# Patient Record
Sex: Female | Born: 1935 | Race: White | Hispanic: No | Marital: Married | Smoking: Former smoker
Health system: Southern US, Community
[De-identification: ages and names within clinical notes are randomized; demographics above are authoritative.]

## PROBLEM LIST (undated history)

## (undated) DIAGNOSIS — R112 Nausea with vomiting, unspecified: Secondary | ICD-10-CM

## (undated) DIAGNOSIS — F32A Depression, unspecified: Secondary | ICD-10-CM

## (undated) DIAGNOSIS — K219 Gastro-esophageal reflux disease without esophagitis: Secondary | ICD-10-CM

## (undated) DIAGNOSIS — F419 Anxiety disorder, unspecified: Secondary | ICD-10-CM

## (undated) DIAGNOSIS — H269 Unspecified cataract: Secondary | ICD-10-CM

## (undated) DIAGNOSIS — R0602 Shortness of breath: Secondary | ICD-10-CM

## (undated) DIAGNOSIS — J449 Chronic obstructive pulmonary disease, unspecified: Secondary | ICD-10-CM

## (undated) DIAGNOSIS — J40 Bronchitis, not specified as acute or chronic: Secondary | ICD-10-CM

## (undated) DIAGNOSIS — Z8719 Personal history of other diseases of the digestive system: Secondary | ICD-10-CM

## (undated) DIAGNOSIS — I8290 Acute embolism and thrombosis of unspecified vein: Secondary | ICD-10-CM

## (undated) DIAGNOSIS — F329 Major depressive disorder, single episode, unspecified: Secondary | ICD-10-CM

## (undated) DIAGNOSIS — T7840XA Allergy, unspecified, initial encounter: Secondary | ICD-10-CM

## (undated) DIAGNOSIS — D759 Disease of blood and blood-forming organs, unspecified: Secondary | ICD-10-CM

## (undated) DIAGNOSIS — J111 Influenza due to unidentified influenza virus with other respiratory manifestations: Secondary | ICD-10-CM

## (undated) DIAGNOSIS — M1990 Unspecified osteoarthritis, unspecified site: Secondary | ICD-10-CM

## (undated) DIAGNOSIS — D689 Coagulation defect, unspecified: Secondary | ICD-10-CM

## (undated) DIAGNOSIS — G9050 Complex regional pain syndrome I, unspecified: Secondary | ICD-10-CM

## (undated) DIAGNOSIS — I10 Essential (primary) hypertension: Secondary | ICD-10-CM

## (undated) DIAGNOSIS — Z9889 Other specified postprocedural states: Secondary | ICD-10-CM

## (undated) DIAGNOSIS — IMO0002 Reserved for concepts with insufficient information to code with codable children: Secondary | ICD-10-CM

## (undated) DIAGNOSIS — N189 Chronic kidney disease, unspecified: Secondary | ICD-10-CM

## (undated) DIAGNOSIS — J189 Pneumonia, unspecified organism: Secondary | ICD-10-CM

## (undated) DIAGNOSIS — E785 Hyperlipidemia, unspecified: Secondary | ICD-10-CM

## (undated) DIAGNOSIS — R7303 Prediabetes: Secondary | ICD-10-CM

## (undated) HISTORY — DX: Allergy, unspecified, initial encounter: T78.40XA

## (undated) HISTORY — PX: COLONOSCOPY: SHX174

## (undated) HISTORY — DX: Major depressive disorder, single episode, unspecified: F32.9

## (undated) HISTORY — DX: Complex regional pain syndrome I, unspecified: G90.50

## (undated) HISTORY — DX: Depression, unspecified: F32.A

## (undated) HISTORY — DX: Unspecified osteoarthritis, unspecified site: M19.90

## (undated) HISTORY — DX: Unspecified cataract: H26.9

## (undated) HISTORY — PX: UPPER GASTROINTESTINAL ENDOSCOPY: SHX188

## (undated) HISTORY — PX: KNEE LIGAMENT RECONSTRUCTION: SHX1895

## (undated) HISTORY — DX: Reserved for concepts with insufficient information to code with codable children: IMO0002

## (undated) HISTORY — PX: KNEE ARTHROSCOPY: SHX127

## (undated) HISTORY — DX: Coagulation defect, unspecified: D68.9

## (undated) HISTORY — DX: Essential (primary) hypertension: I10

## (undated) HISTORY — PX: POLYPECTOMY: SHX149

## (undated) HISTORY — DX: Hyperlipidemia, unspecified: E78.5

## (undated) HISTORY — DX: Influenza due to unidentified influenza virus with other respiratory manifestations: J11.1

## (undated) HISTORY — PX: ELBOW ARTHROSCOPY: SUR87

## (undated) HISTORY — PX: TONSILLECTOMY AND ADENOIDECTOMY: SUR1326

## (undated) HISTORY — DX: Anxiety disorder, unspecified: F41.9

## (undated) HISTORY — DX: Gastro-esophageal reflux disease without esophagitis: K21.9

## (undated) HISTORY — DX: Bronchitis, not specified as acute or chronic: J40

## (undated) HISTORY — PX: GANGLION CYST EXCISION: SHX1691

---

## 1978-10-06 HISTORY — PX: TOTAL ABDOMINAL HYSTERECTOMY: SHX209

## 1998-01-17 ENCOUNTER — Ambulatory Visit (HOSPITAL_COMMUNITY): Admission: RE | Admit: 1998-01-17 | Discharge: 1998-01-17 | Payer: Self-pay | Admitting: Family Medicine

## 1998-01-29 ENCOUNTER — Other Ambulatory Visit: Admission: RE | Admit: 1998-01-29 | Discharge: 1998-01-29 | Payer: Self-pay | Admitting: Family Medicine

## 1998-02-13 ENCOUNTER — Ambulatory Visit (HOSPITAL_BASED_OUTPATIENT_CLINIC_OR_DEPARTMENT_OTHER): Admission: RE | Admit: 1998-02-13 | Discharge: 1998-02-13 | Payer: Self-pay | Admitting: Orthopedic Surgery

## 1998-03-08 ENCOUNTER — Ambulatory Visit (HOSPITAL_COMMUNITY): Admission: RE | Admit: 1998-03-08 | Discharge: 1998-03-08 | Payer: Self-pay | Admitting: Family Medicine

## 1998-04-10 ENCOUNTER — Ambulatory Visit (HOSPITAL_COMMUNITY): Admission: RE | Admit: 1998-04-10 | Discharge: 1998-04-10 | Payer: Self-pay | Admitting: Family Medicine

## 1998-10-06 HISTORY — PX: CHOLECYSTECTOMY: SHX55

## 1999-05-09 ENCOUNTER — Other Ambulatory Visit: Admission: RE | Admit: 1999-05-09 | Discharge: 1999-05-09 | Payer: Self-pay | Admitting: Family Medicine

## 1999-11-15 ENCOUNTER — Encounter: Admission: RE | Admit: 1999-11-15 | Discharge: 1999-11-15 | Payer: Self-pay | Admitting: Family Medicine

## 1999-11-15 ENCOUNTER — Encounter: Payer: Self-pay | Admitting: Family Medicine

## 1999-12-16 ENCOUNTER — Encounter: Payer: Self-pay | Admitting: Family Medicine

## 1999-12-16 ENCOUNTER — Encounter: Admission: RE | Admit: 1999-12-16 | Discharge: 1999-12-16 | Payer: Self-pay | Admitting: Family Medicine

## 2000-05-13 ENCOUNTER — Other Ambulatory Visit: Admission: RE | Admit: 2000-05-13 | Discharge: 2000-05-13 | Payer: Self-pay | Admitting: Family Medicine

## 2000-05-28 ENCOUNTER — Encounter: Admission: RE | Admit: 2000-05-28 | Discharge: 2000-05-28 | Payer: Self-pay | Admitting: Family Medicine

## 2000-05-28 ENCOUNTER — Encounter: Payer: Self-pay | Admitting: Family Medicine

## 2000-10-06 HISTORY — PX: ORIF DISTAL RADIUS FRACTURE: SUR927

## 2001-06-04 ENCOUNTER — Encounter: Payer: Self-pay | Admitting: Family Medicine

## 2001-06-04 ENCOUNTER — Encounter: Admission: RE | Admit: 2001-06-04 | Discharge: 2001-06-04 | Payer: Self-pay | Admitting: Family Medicine

## 2001-06-05 ENCOUNTER — Ambulatory Visit (HOSPITAL_COMMUNITY): Admission: RE | Admit: 2001-06-05 | Discharge: 2001-06-05 | Payer: Self-pay | Admitting: Family Medicine

## 2001-06-05 ENCOUNTER — Encounter: Payer: Self-pay | Admitting: Family Medicine

## 2001-06-14 ENCOUNTER — Other Ambulatory Visit: Admission: RE | Admit: 2001-06-14 | Discharge: 2001-06-14 | Payer: Self-pay | Admitting: Family Medicine

## 2001-07-15 ENCOUNTER — Encounter: Admission: RE | Admit: 2001-07-15 | Discharge: 2001-07-15 | Payer: Self-pay | Admitting: Family Medicine

## 2001-07-15 ENCOUNTER — Encounter: Payer: Self-pay | Admitting: Family Medicine

## 2002-02-16 ENCOUNTER — Emergency Department (HOSPITAL_COMMUNITY): Admission: EM | Admit: 2002-02-16 | Discharge: 2002-02-16 | Payer: Self-pay | Admitting: Emergency Medicine

## 2002-02-16 ENCOUNTER — Encounter: Payer: Self-pay | Admitting: Emergency Medicine

## 2002-02-18 ENCOUNTER — Ambulatory Visit (HOSPITAL_BASED_OUTPATIENT_CLINIC_OR_DEPARTMENT_OTHER): Admission: RE | Admit: 2002-02-18 | Discharge: 2002-02-18 | Payer: Self-pay | Admitting: Orthopedic Surgery

## 2003-04-11 ENCOUNTER — Encounter: Payer: Self-pay | Admitting: Family Medicine

## 2003-04-11 ENCOUNTER — Encounter: Admission: RE | Admit: 2003-04-11 | Discharge: 2003-04-11 | Payer: Self-pay | Admitting: Family Medicine

## 2003-05-10 ENCOUNTER — Encounter: Admission: RE | Admit: 2003-05-10 | Discharge: 2003-08-08 | Payer: Self-pay | Admitting: Family Medicine

## 2004-04-11 ENCOUNTER — Encounter: Admission: RE | Admit: 2004-04-11 | Discharge: 2004-04-11 | Payer: Self-pay | Admitting: Family Medicine

## 2005-04-23 ENCOUNTER — Encounter: Admission: RE | Admit: 2005-04-23 | Discharge: 2005-04-23 | Payer: Self-pay | Admitting: Family Medicine

## 2006-05-07 ENCOUNTER — Encounter: Admission: RE | Admit: 2006-05-07 | Discharge: 2006-05-07 | Payer: Self-pay | Admitting: Family Medicine

## 2006-05-12 ENCOUNTER — Ambulatory Visit: Payer: Self-pay | Admitting: Internal Medicine

## 2006-05-26 ENCOUNTER — Encounter: Payer: Self-pay | Admitting: Internal Medicine

## 2006-05-26 ENCOUNTER — Ambulatory Visit: Payer: Self-pay | Admitting: Internal Medicine

## 2007-05-12 ENCOUNTER — Encounter: Admission: RE | Admit: 2007-05-12 | Discharge: 2007-05-12 | Payer: Self-pay | Admitting: Family Medicine

## 2007-05-19 ENCOUNTER — Encounter: Admission: RE | Admit: 2007-05-19 | Discharge: 2007-05-19 | Payer: Self-pay | Admitting: Family Medicine

## 2007-10-07 HISTORY — PX: ROTATOR CUFF REPAIR: SHX139

## 2007-11-09 ENCOUNTER — Encounter: Admission: RE | Admit: 2007-11-09 | Discharge: 2007-11-09 | Payer: Self-pay | Admitting: Family Medicine

## 2008-04-25 ENCOUNTER — Encounter: Admission: RE | Admit: 2008-04-25 | Discharge: 2008-05-15 | Payer: Self-pay | Admitting: Family Medicine

## 2008-05-12 ENCOUNTER — Encounter: Admission: RE | Admit: 2008-05-12 | Discharge: 2008-05-12 | Payer: Self-pay | Admitting: Family Medicine

## 2008-09-06 ENCOUNTER — Inpatient Hospital Stay (HOSPITAL_COMMUNITY): Admission: EM | Admit: 2008-09-06 | Discharge: 2008-09-08 | Payer: Self-pay | Admitting: Emergency Medicine

## 2008-09-13 ENCOUNTER — Encounter: Admission: RE | Admit: 2008-09-13 | Discharge: 2008-10-05 | Payer: Self-pay | Admitting: Orthopedic Surgery

## 2008-10-09 ENCOUNTER — Encounter: Admission: RE | Admit: 2008-10-09 | Discharge: 2009-01-07 | Payer: Self-pay | Admitting: Orthopedic Surgery

## 2009-05-15 ENCOUNTER — Encounter: Admission: RE | Admit: 2009-05-15 | Discharge: 2009-05-15 | Payer: Self-pay | Admitting: Family Medicine

## 2009-06-29 ENCOUNTER — Emergency Department (HOSPITAL_COMMUNITY): Admission: EM | Admit: 2009-06-29 | Discharge: 2009-06-30 | Payer: Self-pay | Admitting: Emergency Medicine

## 2009-06-29 ENCOUNTER — Ambulatory Visit: Payer: Self-pay | Admitting: Vascular Surgery

## 2009-06-30 ENCOUNTER — Encounter (INDEPENDENT_AMBULATORY_CARE_PROVIDER_SITE_OTHER): Payer: Self-pay | Admitting: Emergency Medicine

## 2009-06-30 ENCOUNTER — Ambulatory Visit (HOSPITAL_COMMUNITY): Admission: RE | Admit: 2009-06-30 | Discharge: 2009-06-30 | Payer: Self-pay | Admitting: Emergency Medicine

## 2010-05-16 ENCOUNTER — Encounter: Admission: RE | Admit: 2010-05-16 | Discharge: 2010-05-16 | Payer: Self-pay | Admitting: Family Medicine

## 2011-01-10 LAB — COMPREHENSIVE METABOLIC PANEL
ALT: 18 U/L (ref 0–35)
AST: 24 U/L (ref 0–37)
Albumin: 3.8 g/dL (ref 3.5–5.2)
Alkaline Phosphatase: 115 U/L (ref 39–117)
BUN: 17 mg/dL (ref 6–23)
CO2: 25 mEq/L (ref 19–32)
Calcium: 9.5 mg/dL (ref 8.4–10.5)
Chloride: 108 mEq/L (ref 96–112)
Creatinine, Ser: 0.97 mg/dL (ref 0.4–1.2)
GFR calc Af Amer: >60 mL/min (ref >60)
GFR calc non Af Amer: 57 mL/min — ABNORMAL LOW (ref >60)
Glucose, Bld: 124 mg/dL — ABNORMAL HIGH (ref 70–99)
Potassium: 4.2 mEq/L (ref 3.5–5.1)
Sodium: 140 mEq/L (ref 135–145)
Total Bilirubin: 0.7 mg/dL (ref 0.3–1.2)
Total Protein: 7.3 g/dL (ref 6.0–8.3)

## 2011-01-10 LAB — DIFFERENTIAL
Basophils Absolute: 0.1 10*3/uL (ref 0.0–0.1)
Basophils Relative: 1 % (ref 0–1)
Eosinophils Absolute: 0.2 10*3/uL (ref 0.0–0.7)
Eosinophils Relative: 2 % (ref 0–5)
Lymphocytes Relative: 26 % (ref 12–46)
Lymphs Abs: 2.5 10*3/uL (ref 0.7–4.0)
Monocytes Absolute: 0.8 10*3/uL (ref 0.1–1.0)
Monocytes Relative: 9 % (ref 3–12)
Neutro Abs: 6 10*3/uL (ref 1.7–7.7)
Neutrophils Relative %: 63 % (ref 43–77)

## 2011-01-10 LAB — CBC
HCT: 39.4 % (ref 36.0–46.0)
Hemoglobin: 13.3 g/dL (ref 12.0–15.0)
MCHC: 33.8 g/dL (ref 30.0–36.0)
MCV: 92.4 fL (ref 78.0–100.0)
Platelets: 285 10*3/uL (ref 150–400)
RBC: 4.27 MIL/uL (ref 3.87–5.11)
RDW: 13.1 % (ref 11.5–15.5)
WBC: 9.6 10*3/uL (ref 4.0–10.5)

## 2011-01-10 LAB — PROTIME-INR
INR: 0.9 (ref 0.00–1.49)
Prothrombin Time: 12.5 seconds (ref 11.6–15.2)

## 2011-01-10 LAB — APTT: aPTT: 26 seconds (ref 24–37)

## 2011-02-18 NOTE — H&P (Signed)
NAME:  Deanna Acosta NO.:  [id number]   MEDICAL RECORD NO.:  [id number]          PATIENT TYPE:  EMS   LOCATION:  MAJO                         FACILITY:  [facility]   PHYSICIAN:  Michiel Cowboy, MDDATE OF BIRTH:  May 24, 1936   DATE OF ADMISSION:  09/06/2008  DATE OF DISCHARGE:                              HISTORY & PHYSICAL   PRIMARY CARE PHYSICIAN:  Dr. Sigmund Hazel.   ORTHOPEDIC SURGEON:  Dr. Ranell Patrick.   CHIEF COMPLAINT:  Cough and shortness of breath.   Patient is a 75 year old female who had on Monday a same day surgery for  rotator cuff tear of her right shoulder.  Thereafter she developed cough  and pleuritic chest pain, fevers, chills, shortness of breath and  presented to emergency department where a D-dimer was initially noted to  be elevated.  She received a CT of a chest which showed no PE with  possible areas consistent with pneumonia versus aspiration pneumonia.  At this point [facility] was called for admission.  Otherwise,  patient denies any nausea, vomiting, constipation, diarrhea.  No melena,  no bright red blood per rectum, no other complaints, neurologic or  urinary.   PAST MEDICAL HISTORY:  Significant for:  1. Hyperlipidemia.  2. GERD.  3. Tobacco abuse in the past.   SOCIAL HISTORY:  Patient used to smoke but quit 10 years ago.  Does not  use alcohol.  Lives at home with her husband.   FAMILY HISTORY:  Noncontributory.   ALLERGIES:  PENICILLIN.   MEDICATIONS:  Patient does not know what she actually takes but  reportedly takes Percocet, Prilosec, some medicine for high cholesterol  and some nerve pill and the amount she cannot remember.   VITALS:  Temperature 99.3.  Blood pressure 164/59.  Pulse 99.  Respiration 28.  Satting 93% on room air.  Patient appears to be in no  acute distress.  Elderly female sitting down in bed.  HEAD:  Nontraumatic.  Moist mucous membranes.  No JVD noted.  LUNGS:  Occasional crackles and coarse  breath sounds bilaterally,  otherwise unremarkable.  No wheezes noted.  Very good air movement.  HEART:  Regular rate and rhythm, no murmurs, rubs, or gallops.  Somewhat  distant heart sounds though.  ABDOMEN:  Soft, nontender, nondistended.  LOWER EXTREMITIES:  No clubbing, cyanosis or edema.  Strength 5 out of 5  in lower extremities.  Cranial nerves intact.   LABORATORIES:  White blood cells 14.2, hemoglobin 13.2, D-dimer 0.66,  sodium 137, potassium 4.0, creatinine 0.9, BMP 53.  UA showing 7 to 10  red blood cells with rare bacteria.  Chest x-ray showed bibasilar  scarring with atelectasis.  CT of the chest show no PE but emphysema  noted and small infiltrates worrisome for aspiration pneumonia.  EKG  showing sinus tachycardia to 113, low voltage QRS, Q waves in lead 3 and  aVF but otherwise unremarkable.   ASSESSMENT AND PLAN:  This is a 75 year old female with history of  hypertension and recent surgery who comes in with pneumonia with  questionable aspiration.  I do not have an operative  report available  for me.  Not sure if general anesthesia were involved or not in this  case which may put her in a higher risk of aspiration.  For now we will  cover for aspiration pneumonia.   Pneumonia:  We will treat with Levaquin as patient may have a mild  urinary tract infection in this above as well as clindamycin.  We will  obtain a swallow eval to evaluate for aspiration.   We will obtain blood cultures, give incentive spirometry.   Possibly mild urinary tract infection:  We will cover Levaquin and avoid  urine culture.   History of hyperlipidemia:  Hold off on the medication right now since  we do not have a name of what medicine she takes.  We will restart the  meds once the patient brings them in.   History of chronic obstructive pulmonary disease:  Given recent chest x-  ray consistent with emphysema and history of tobacco abuse, we will make  sure patient is on Atrovent and  albuterol p.r.n.   History of gastroesophageal reflux disease:  Order Protonix.   Prophylaxis:  Protonix plus Lovenox.      Michiel Cowboy, MD  Electronically Signed     AVD/MEDQ  D:  09/06/2008  T:  09/06/2008  Job:  [id number]   cc:   Sigmund Hazel, M.D.  NORRIS, DR

## 2011-02-21 NOTE — Op Note (Signed)
[place]  Patient:    Deanna Acosta, Deanna Acosta Visit Number: [id number] MRN: [id number]          Service Type: DSU Location: [facility] Attending Physician:  Ronne Binning Dictated by:   Nicki Reaper, M.D. Proc. Date: 02/18/02 Admit Date:  02/18/2002   CC:         (2)   Operative Report  PREOPERATIVE DIAGNOSIS: Colles fracture, right wrist.  POSTOPERATIVE DIAGNOSIS: Colles fracture, right wrist.  OPERATION/PROCEDURE: Open reduction and internal fixation of distal radius, right wrist.  SURGEON: Nicki Reaper, M.D.  ASSISTANT: Joaquin Courts, R.N.  ANESTHESIA: Axillary, general.  ANESTHESIOLOGIST: Kaylyn Layer. Michelle Piper, M.D.  HISTORY: The patient is a 75 year old female who suffered a fall with intra-articular fracture of the distal radius, with dorsal angulation.  PROCEDURE: The patient was brought to the operating room, where an axillary block was carried out without difficulty.  She was prepped and draped using DuraPrep.  She continued to have discomfort.  A general anesthetic was then given.  A volar incision was made at the radial aspect of her right wrist and carried down through subcutaneous tissue.  This was over the flexor carpi radialis and carried down through the flexor carpi radialis tendon.  The dissection was carried down to the distal radius, protecting the radial artery to the radial side of the flexor carpi ulnaris, etc., to the ulnar side. Bleeders were electrocauterized.  The pronator quadratus was incised in its radial margin and lifted from the distal radius.  The fracture was immediately apparent.  The fracture was reduced.  The DVR plate was placed for positioning.  The sliding screw hole was then drilled and a 14 mm screw tapped.  The plate was then adjusted.  The distal pegs were then placed. These measured between 14 and 20 mm.  These were placed, four in nature.  The most ulnar was placed as a screw due to the fracture on  the dorsal surface. The remaining proximal screws were placed, affixing the plate.  This was done under image intensification to be certain that none of the pegs or screws entered into the joint.  The remaining proximal drill holes were each 14 mm. The screws were placed, firmly affixing, and x-rays revealed that the fracture was well aligned in AP in lateral direction, with the plate in good position. The wound was copiously irrigated with saline.  The pronator was closed with figure-of-eight 4-0 Vicryl sutures, the subcutaneous tissue with 4-0 Vicryl, and the skin with interrupted 5-0 nylon sutures.  A sterile compressive dressing and splint were applied.  The patient tolerated the procedure well and was taken to the recovery room for observation in satisfactory condition. She is admitted for overnight stay for pain control.  She will be discharged on Percocet and Septra DS. Dictated by:   Nicki Reaper, M.D. Attending Physician:  Ronne Binning DD:  02/18/02 TD:  02/20/02 Job: [id number]

## 2011-05-07 ENCOUNTER — Other Ambulatory Visit: Payer: Self-pay | Admitting: Family Medicine

## 2011-05-07 DIAGNOSIS — Z1231 Encounter for screening mammogram for malignant neoplasm of breast: Secondary | ICD-10-CM

## 2011-06-12 ENCOUNTER — Ambulatory Visit: Payer: Self-pay

## 2011-06-16 ENCOUNTER — Encounter: Payer: Self-pay | Admitting: Internal Medicine

## 2011-06-24 ENCOUNTER — Ambulatory Visit: Payer: Self-pay

## 2011-06-30 ENCOUNTER — Ambulatory Visit (AMBULATORY_SURGERY_CENTER): Payer: Medicare Other | Admitting: *Deleted

## 2011-06-30 ENCOUNTER — Ambulatory Visit
Admission: RE | Admit: 2011-06-30 | Discharge: 2011-06-30 | Disposition: A | Discharge status: Home / Self Care | Payer: Medicare Other | Source: Ambulatory Visit | Attending: Family Medicine | Admitting: Family Medicine

## 2011-06-30 ENCOUNTER — Encounter: Payer: Self-pay | Admitting: Internal Medicine

## 2011-06-30 VITALS — Ht 65.0 in | Wt 195.0 lb

## 2011-06-30 DIAGNOSIS — Z1211 Encounter for screening for malignant neoplasm of colon: Secondary | ICD-10-CM

## 2011-06-30 DIAGNOSIS — Z1231 Encounter for screening mammogram for malignant neoplasm of breast: Secondary | ICD-10-CM

## 2011-06-30 MED ORDER — PEG-KCL-NACL-NASULF-NA ASC-C 100 G PO SOLR: ORAL | Status: DC

## 2011-07-10 LAB — CBC
HCT: 35.6 % — ABNORMAL LOW (ref 36.0–46.0)
HCT: 39.9 % (ref 36.0–46.0)
Hemoglobin: 11.8 g/dL — ABNORMAL LOW (ref 12.0–15.0)
Hemoglobin: 13.2 g/dL (ref 12.0–15.0)
MCHC: 33.1 g/dL (ref 30.0–36.0)
MCHC: 33.3 g/dL (ref 30.0–36.0)
MCV: 92.7 fL (ref 78.0–100.0)
MCV: 92.9 fL (ref 78.0–100.0)
Platelets: 234 10*3/uL (ref 150–400)
Platelets: 261 10*3/uL (ref 150–400)
RBC: 3.84 MIL/uL — ABNORMAL LOW (ref 3.87–5.11)
RBC: 4.3 MIL/uL (ref 3.87–5.11)
RDW: 13.3 % (ref 11.5–15.5)
RDW: 13.5 % (ref 11.5–15.5)
WBC: 10.4 10*3/uL (ref 4.0–10.5)
WBC: 14.2 10*3/uL — ABNORMAL HIGH (ref 4.0–10.5)

## 2011-07-10 LAB — DIFFERENTIAL
Basophils Absolute: 0 10*3/uL (ref 0.0–0.1)
Basophils Absolute: 0.2 10*3/uL — ABNORMAL HIGH (ref 0.0–0.1)
Basophils Relative: 0 % (ref 0–1)
Basophils Relative: 1 % (ref 0–1)
Eosinophils Absolute: 0.1 10*3/uL (ref 0.0–0.7)
Eosinophils Absolute: 0.4 10*3/uL (ref 0.0–0.7)
Eosinophils Relative: 1 % (ref 0–5)
Eosinophils Relative: 4 % (ref 0–5)
Lymphocytes Relative: 20 % (ref 12–46)
Lymphocytes Relative: 7 % — ABNORMAL LOW (ref 12–46)
Lymphs Abs: 1 10*3/uL (ref 0.7–4.0)
Lymphs Abs: 2.1 10*3/uL (ref 0.7–4.0)
Monocytes Absolute: 0.7 10*3/uL (ref 0.1–1.0)
Monocytes Absolute: 0.9 10*3/uL (ref 0.1–1.0)
Monocytes Relative: 6 % (ref 3–12)
Monocytes Relative: 7 % (ref 3–12)
Neutro Abs: 12 10*3/uL — ABNORMAL HIGH (ref 1.7–7.7)
Neutro Abs: 7.2 10*3/uL (ref 1.7–7.7)
Neutrophils Relative %: 69 % (ref 43–77)
Neutrophils Relative %: 85 % — ABNORMAL HIGH (ref 43–77)

## 2011-07-10 LAB — COMPREHENSIVE METABOLIC PANEL
ALT: 22 U/L (ref 0–35)
AST: 30 U/L (ref 0–37)
Albumin: 3.3 g/dL — ABNORMAL LOW (ref 3.5–5.2)
Alkaline Phosphatase: 73 U/L (ref 39–117)
BUN: 10 mg/dL (ref 6–23)
CO2: 20 mEq/L (ref 19–32)
Calcium: 8.5 mg/dL (ref 8.4–10.5)
Chloride: 101 mEq/L (ref 96–112)
Creatinine, Ser: 0.94 mg/dL (ref 0.4–1.2)
GFR calc Af Amer: >60 mL/min (ref >60)
GFR calc non Af Amer: 59 mL/min — ABNORMAL LOW (ref >60)
Glucose, Bld: 51 mg/dL — ABNORMAL LOW (ref 70–99)
Potassium: 4.1 mEq/L (ref 3.5–5.1)
Sodium: 134 mEq/L — ABNORMAL LOW (ref 135–145)
Total Bilirubin: 0.6 mg/dL (ref 0.3–1.2)
Total Protein: 6.4 g/dL (ref 6.0–8.3)

## 2011-07-10 LAB — BASIC METABOLIC PANEL
BUN: 9 mg/dL (ref 6–23)
BUN: 9 mg/dL (ref 6–23)
CO2: 23 mEq/L (ref 19–32)
CO2: 25 mEq/L (ref 19–32)
Calcium: 8.4 mg/dL (ref 8.4–10.5)
Calcium: 8.6 mg/dL (ref 8.4–10.5)
Chloride: 105 mEq/L (ref 96–112)
Chloride: 108 mEq/L (ref 96–112)
Creatinine, Ser: 0.87 mg/dL (ref 0.4–1.2)
Creatinine, Ser: 0.92 mg/dL (ref 0.4–1.2)
GFR calc Af Amer: >60 mL/min (ref >60)
GFR calc Af Amer: >60 mL/min (ref >60)
GFR calc non Af Amer: >60 mL/min (ref >60)
GFR calc non Af Amer: >60 mL/min (ref >60)
Glucose, Bld: 113 mg/dL — ABNORMAL HIGH (ref 70–99)
Glucose, Bld: 91 mg/dL (ref 70–99)
Potassium: 3.6 mEq/L (ref 3.5–5.1)
Potassium: 4 mEq/L (ref 3.5–5.1)
Sodium: 137 mEq/L (ref 135–145)
Sodium: 138 mEq/L (ref 135–145)

## 2011-07-10 LAB — URINALYSIS, ROUTINE W REFLEX MICROSCOPIC
Bilirubin Urine: NEGATIVE
Glucose, UA: NEGATIVE mg/dL
Hgb urine dipstick: NEGATIVE
Ketones, ur: NEGATIVE mg/dL
Nitrite: NEGATIVE
Protein, ur: NEGATIVE mg/dL
Specific Gravity, Urine: 1.015 (ref 1.005–1.030)
Urobilinogen, UA: 0.2 mg/dL (ref 0.0–1.0)
pH: 8 (ref 5.0–8.0)

## 2011-07-10 LAB — URINE MICROSCOPIC-ADD ON

## 2011-07-10 LAB — URINE CULTURE: Colony Count: 30000

## 2011-07-10 LAB — CULTURE, BLOOD (ROUTINE X 2)
Culture: NO GROWTH
Culture: NO GROWTH

## 2011-07-10 LAB — CK TOTAL AND CKMB (NOT AT ARMC)
CK, MB: 1.4 ng/mL (ref 0.3–4.0)
Relative Index: 0.9 (ref 0.0–2.5)
Total CK: 163 U/L (ref 7–177)

## 2011-07-10 LAB — POCT CARDIAC MARKERS
CKMB, poc: 1.2 ng/mL (ref 1.0–8.0)
Myoglobin, poc: 120 ng/mL (ref 12–200)
Troponin i, poc: <0.05 ng/mL (ref 0.00–0.09)

## 2011-07-10 LAB — B-NATRIURETIC PEPTIDE (CONVERTED LAB): Pro B Natriuretic peptide (BNP): 53 pg/mL (ref 0.0–100.0)

## 2011-07-10 LAB — TROPONIN I: Troponin I: 0.01 ng/mL (ref 0.00–0.06)

## 2011-07-10 LAB — D-DIMER, QUANTITATIVE: D-Dimer, Quant: 0.66 ug/mL-FEU — ABNORMAL HIGH (ref 0.00–0.48)

## 2011-07-15 ENCOUNTER — Ambulatory Visit (AMBULATORY_SURGERY_CENTER): Payer: Medicare Other | Admitting: Internal Medicine

## 2011-07-15 ENCOUNTER — Encounter: Payer: Self-pay | Admitting: Internal Medicine

## 2011-07-15 VITALS — BP 145/66 | HR 66 | Temp 96.7°F | Resp 15 | Ht 65.0 in | Wt 195.0 lb

## 2011-07-15 DIAGNOSIS — Z86010 Personal history of colonic polyps: Secondary | ICD-10-CM

## 2011-07-15 DIAGNOSIS — Z1211 Encounter for screening for malignant neoplasm of colon: Secondary | ICD-10-CM

## 2011-07-15 MED ORDER — SODIUM CHLORIDE 0.9 % IV SOLN: 500.0000 mL | INTRAVENOUS | Status: DC

## 2011-07-15 NOTE — Patient Instructions (Signed)
Please review discharge instructions (blue and green sheets)  Resume your normal medications  Review information given about hemorrhoids, diverticulosis, and high fiber diets

## 2011-07-16 ENCOUNTER — Telehealth: Payer: Self-pay

## 2011-07-16 NOTE — Telephone Encounter (Signed)

## 2012-02-24 ENCOUNTER — Ambulatory Visit: Payer: Medicare Other | Admitting: Internal Medicine

## 2012-03-31 ENCOUNTER — Ambulatory Visit (HOSPITAL_COMMUNITY)
Admission: RE | Admit: 2012-03-31 | Discharge: 2012-03-31 | Disposition: A | Discharge status: Home / Self Care | Payer: Medicare Other | Source: Ambulatory Visit | Attending: Family Medicine | Admitting: Family Medicine

## 2012-03-31 DIAGNOSIS — M7989 Other specified soft tissue disorders: Secondary | ICD-10-CM

## 2012-03-31 DIAGNOSIS — M79669 Pain in unspecified lower leg: Secondary | ICD-10-CM

## 2012-03-31 DIAGNOSIS — M79609 Pain in unspecified limb: Secondary | ICD-10-CM

## 2012-03-31 NOTE — Progress Notes (Signed)
Right lower extremity venous duplex completed.  Preliminary report is negative for DVT or SVT in the right lower extremity.  Negative for DVT in the left common femoral vein.  There appears to be a Baker's cyst in the right pop fossa with fluid extending into the calf.

## 2012-05-26 ENCOUNTER — Emergency Department (HOSPITAL_COMMUNITY)
Admission: EM | Admit: 2012-05-26 | Discharge: 2012-05-26 | Disposition: A | Discharge status: Home / Self Care | Payer: Medicare Other | Attending: Emergency Medicine | Admitting: Emergency Medicine

## 2012-05-26 ENCOUNTER — Emergency Department (HOSPITAL_COMMUNITY): Payer: Medicare Other

## 2012-05-26 ENCOUNTER — Encounter (HOSPITAL_COMMUNITY): Payer: Self-pay | Admitting: *Deleted

## 2012-05-26 DIAGNOSIS — Z87891 Personal history of nicotine dependence: Secondary | ICD-10-CM | POA: Insufficient documentation

## 2012-05-26 DIAGNOSIS — J45909 Unspecified asthma, uncomplicated: Secondary | ICD-10-CM | POA: Insufficient documentation

## 2012-05-26 DIAGNOSIS — X500XXA Overexertion from strenuous movement or load, initial encounter: Secondary | ICD-10-CM | POA: Insufficient documentation

## 2012-05-26 DIAGNOSIS — M810 Age-related osteoporosis without current pathological fracture: Secondary | ICD-10-CM | POA: Insufficient documentation

## 2012-05-26 DIAGNOSIS — F411 Generalized anxiety disorder: Secondary | ICD-10-CM | POA: Insufficient documentation

## 2012-05-26 DIAGNOSIS — F329 Major depressive disorder, single episode, unspecified: Secondary | ICD-10-CM | POA: Insufficient documentation

## 2012-05-26 DIAGNOSIS — S92309A Fracture of unspecified metatarsal bone(s), unspecified foot, initial encounter for closed fracture: Secondary | ICD-10-CM | POA: Insufficient documentation

## 2012-05-26 DIAGNOSIS — I10 Essential (primary) hypertension: Secondary | ICD-10-CM | POA: Insufficient documentation

## 2012-05-26 DIAGNOSIS — G9050 Complex regional pain syndrome I, unspecified: Secondary | ICD-10-CM | POA: Insufficient documentation

## 2012-05-26 DIAGNOSIS — F3289 Other specified depressive episodes: Secondary | ICD-10-CM | POA: Insufficient documentation

## 2012-05-26 DIAGNOSIS — Z880 Allergy status to penicillin: Secondary | ICD-10-CM | POA: Insufficient documentation

## 2012-05-26 DIAGNOSIS — S92353A Displaced fracture of fifth metatarsal bone, unspecified foot, initial encounter for closed fracture: Secondary | ICD-10-CM

## 2012-05-26 DIAGNOSIS — E785 Hyperlipidemia, unspecified: Secondary | ICD-10-CM | POA: Insufficient documentation

## 2012-05-26 DIAGNOSIS — K219 Gastro-esophageal reflux disease without esophagitis: Secondary | ICD-10-CM | POA: Insufficient documentation

## 2012-05-26 MED ORDER — HYDROCODONE-ACETAMINOPHEN 5-325 MG PO TABS: ORAL_TABLET | ORAL | Status: AC

## 2012-05-26 MED ORDER — HYDROCODONE-ACETAMINOPHEN 5-325 MG PO TABS
1.0000 | ORAL_TABLET | Freq: Once | ORAL | Status: AC
  Administered 2012-05-26: 1 via ORAL
  Filled 2012-05-26: qty 1

## 2012-05-26 NOTE — ED Provider Notes (Signed)
History     CSN: [id number]  Arrival date & time 05/26/12  1635   First MD Initiated Contact with Patient 05/26/12 1748      Chief Complaint  Patient presents with  . Foot Injury    (Consider location/radiation/quality/duration/timing/severity/associated sxs/prior treatment) HPI Comments: Patient c/o pain and mild swelling to her left foot that began at noon today when she "twisted" her ankle and fell.  States pain has progressed throughout the day.  Able to bear weight , but states "it's painful".  She denies other injuries specifically neck or back pain, hip pain, head injury or LOC, no  numbness or proximal tenderness.  She also denies symptoms prior to the fall.    Patient is a 76 y.o. female presenting with foot injury. The history is provided by the patient.  Foot Injury  The incident occurred 6 to 12 hours ago. The incident occurred at home. The injury mechanism was a fall. The pain is present in the left foot. The quality of the pain is described as aching and throbbing. The pain is moderate. The pain has been constant since onset. Pertinent negatives include no numbness, no inability to bear weight, no loss of motion, no muscle weakness, no loss of sensation and no tingling. She reports no foreign bodies present. The symptoms are aggravated by activity, bearing weight and palpation. She has tried elevation for the symptoms. The treatment provided no relief.    Past Medical History  Diagnosis Date  . Allergy     seasonal  . Depression   . Anxiety   . Arthritis   . Asthma     bronchitis  . GERD (gastroesophageal reflux disease)   . Hyperlipidemia   . Hypertension   . Osteoporosis   . Ulcer     H-pylorie treated  . RSD (reflex sympathetic dystrophy)     Past Surgical History  Procedure Date  . Rotator cuff repair 2009  . Cholecystectomy 2000  . Orif distal radius fracture 2002    plate  . Total abdominal hysterectomy 1980    fibroids  . Knee arthroscopy    right  . Elbow arthroscopy     tendonitis/right  . Tonsillectomy and adenoidectomy   . Ganglion cyst excision     left wrist  . Knee ligament reconstruction     right/water ski accident    Family History  Problem Relation Age of Onset  . Ulcers Father     bleeding/gastric  . Colon cancer Neg Hx   . Esophageal cancer Neg Hx   . Stomach cancer Neg Hx     History  Substance Use Topics  . Smoking status: Former Games developer  . Smokeless tobacco: Never Used  . Alcohol Use: No    OB History    Grav Para Term Preterm Abortions TAB SAB Ect Mult Living                  Review of Systems  Constitutional: Negative for fever and chills.  HENT: Negative for neck pain.   Respiratory: Negative for shortness of breath.   Cardiovascular: Negative for chest pain.  Gastrointestinal: Negative for abdominal pain.  Genitourinary: Negative for dysuria and difficulty urinating.  Musculoskeletal: Positive for joint swelling and arthralgias. Negative for back pain.  Skin: Negative for color change and wound.  Neurological: Negative for dizziness, tingling, numbness and headaches.  All other systems reviewed and are negative.    Allergies  Penicillins  Home Medications   Current Outpatient Rx  Name Route Sig Dispense Refill  . ACETAMINOPHEN 325 MG PO TABS Oral Take 650 mg by mouth every 6 (six) hours as needed.      . ALBUTEROL SULFATE HFA 108 (90 BASE) MCG/ACT IN AERS Inhalation Inhale 2 puffs into the lungs every 6 (six) hours as needed.      . ALENDRONATE SODIUM 70 MG PO TABS Oral Take 70 mg by mouth every 7 (seven) days. Take with a full glass of water on an empty stomach.     . ASPIRIN 81 MG PO TABS Oral Take 81 mg by mouth daily.      Marland Kitchen VITAMIN D 1000 UNITS PO TABS Oral Take 1,000 Units by mouth daily.      . OMEGA-3 FATTY ACIDS 1000 MG PO CAPS Oral Take 1 g by mouth once a week.      Marland Kitchen FLUOXETINE HCL 40 MG PO CAPS Oral Take 40 mg by mouth daily.      Marland Kitchen LORATADINE 10 MG PO TABS Oral  Take 10 mg by mouth daily.      Marland Kitchen LOSARTAN POTASSIUM 100 MG PO TABS Oral Take 100 mg by mouth daily.      Marland Kitchen METHOCARBAMOL 500 MG PO TABS  1 tablet Daily.    Marland Kitchen NIACIN-LOVASTATIN ER 1000-20 MG PO TB24 Oral Take 1 tablet by mouth at bedtime.      . OMEPRAZOLE 40 MG PO CPDR Oral Take 40 mg by mouth daily.        BP 152/71  Pulse 76  Temp 97.4 F (36.3 C) (Oral)  Resp 20  Ht 5\' 4"  (1.626 m)  Wt 200 lb (90.719 kg)  BMI 34.33 kg/m2  SpO2 96%  Physical Exam  Nursing note and vitals reviewed. Constitutional: She is oriented to person, place, and time. She appears well-developed and well-nourished. No distress.  HENT:  Head: Normocephalic and atraumatic.  Cardiovascular: Normal rate, regular rhythm, normal heart sounds and intact distal pulses.   Pulmonary/Chest: Effort normal and breath sounds normal.  Musculoskeletal: She exhibits tenderness.       Left foot: She exhibits tenderness, bony tenderness and swelling. She exhibits normal range of motion, normal capillary refill, no crepitus, no deformity and no laceration.       Feet:       ttp of the lateral left foot.  Mild STS.  ROM is preserved.  DP pulse is brisk, sensation intact.  No erythema, abrasion, bruising or deformity.  No proximal tenderness  Neurological: She is alert and oriented to person, place, and time. She exhibits normal muscle tone. Coordination normal.  Skin: Skin is warm and dry.    ED Course  Procedures (including critical care time)  Labs Reviewed - No data to display Dg Ankle Complete Left  05/26/2012  *RADIOLOGY REPORT*  Clinical Data: Larey Seat twisting foot and ankle  LEFT ANKLE COMPLETE - 3+ VIEW  Comparison: None.  Findings: The ankle joint appears normal.  Alignment is normal. Linear fracture through the proximal left fifth metatarsal is noted.  IMPRESSION: Fracture of the proximal left fifth metatarsal. Negative left ankle.   Original Report Authenticated By: Juline Patch, M.D.    Dg Foot Complete  Left  05/26/2012  *RADIOLOGY REPORT*  Clinical Data: Larey Seat twisting foot with ankle pain  LEFT FOOT - COMPLETE 3+ VIEW  Comparison: None.  Findings: There is a transverse fracture through the proximal left fifth metatarsal with minimal distraction.  There is overlying soft tissue swelling present.  No other  acute bony abnormality is seen. Tarsal - metatarsal alignment is normal.  IMPRESSION: Transverse fracture of the proximal left fifth metatarsal.   Original Report Authenticated By: Juline Patch, M.D.      Cam walker applied by nursing, pain improved, remains NV intact.     MDM     Consulted Dr. Lequita Halt, recommended cam walker and to keep her appt with Dr. Charlann Boxer tomorrow.  Pt agrees to care plan and verbalized understanding.  Pt also has a cane at home.    The patient appears reasonably screened and/or stabilized for discharge and I doubt any other medical condition or other [facility] requiring further screening, evaluation, or treatment in the ED at this time prior to discharge.   Prescribed:  norco #[id number]  Jud Fanguy L. [place], [place] 05/26/12 2104

## 2012-05-26 NOTE — ED Notes (Signed)
Pt presents with left foot injury, fell/twisted her ankle and foot while letting the dogs out. Left foot has noted swelling, pulses present and equal bilaterally. Cap refill brisk. Pt denies other injuries at this time.

## 2012-05-26 NOTE — ED Notes (Signed)
Pt c/o twisting her left foot and ankle at lunch time today. Pt c/o pain with movement or touch.

## 2012-05-27 NOTE — ED Provider Notes (Signed)
Medical screening examination/treatment/procedure(s) were performed by non-physician practitioner and as supervising physician I was immediately available for consultation/collaboration.  Donnetta Hutching, MD 05/27/12 1623

## 2012-06-01 ENCOUNTER — Other Ambulatory Visit: Payer: Self-pay | Admitting: Family Medicine

## 2012-06-01 DIAGNOSIS — Z1231 Encounter for screening mammogram for malignant neoplasm of breast: Secondary | ICD-10-CM

## 2012-07-05 ENCOUNTER — Ambulatory Visit
Admission: RE | Admit: 2012-07-05 | Discharge: 2012-07-05 | Disposition: A | Discharge status: Home / Self Care | Payer: Medicare Other | Source: Ambulatory Visit | Attending: Family Medicine | Admitting: Family Medicine

## 2012-07-05 DIAGNOSIS — Z1231 Encounter for screening mammogram for malignant neoplasm of breast: Secondary | ICD-10-CM

## 2012-09-13 ENCOUNTER — Other Ambulatory Visit (HOSPITAL_COMMUNITY): Payer: Self-pay | Admitting: Family Medicine

## 2012-09-13 DIAGNOSIS — Z01818 Encounter for other preprocedural examination: Secondary | ICD-10-CM

## 2012-09-20 ENCOUNTER — Ambulatory Visit (HOSPITAL_COMMUNITY)
Admission: RE | Admit: 2012-09-20 | Discharge: 2012-09-20 | Disposition: A | Discharge status: Home / Self Care | Payer: Medicare Other | Source: Ambulatory Visit | Attending: Family Medicine | Admitting: Family Medicine

## 2012-09-20 DIAGNOSIS — J4489 Other specified chronic obstructive pulmonary disease: Secondary | ICD-10-CM | POA: Insufficient documentation

## 2012-09-20 DIAGNOSIS — Z01818 Encounter for other preprocedural examination: Secondary | ICD-10-CM | POA: Insufficient documentation

## 2012-09-20 DIAGNOSIS — N289 Disorder of kidney and ureter, unspecified: Secondary | ICD-10-CM | POA: Insufficient documentation

## 2012-09-20 DIAGNOSIS — J449 Chronic obstructive pulmonary disease, unspecified: Secondary | ICD-10-CM | POA: Insufficient documentation

## 2012-09-20 DIAGNOSIS — I10 Essential (primary) hypertension: Secondary | ICD-10-CM | POA: Insufficient documentation

## 2012-09-20 DIAGNOSIS — Z01811 Encounter for preprocedural respiratory examination: Secondary | ICD-10-CM | POA: Insufficient documentation

## 2012-09-20 MED ORDER — ALBUTEROL SULFATE (5 MG/ML) 0.5% IN NEBU
2.5000 mg | INHALATION_SOLUTION | Freq: Once | RESPIRATORY_TRACT | Status: AC
  Administered 2012-09-20: 2.5 mg via RESPIRATORY_TRACT

## 2012-09-23 ENCOUNTER — Encounter (HOSPITAL_COMMUNITY): Payer: Self-pay | Admitting: Pharmacy Technician

## 2012-09-23 ENCOUNTER — Emergency Department (HOSPITAL_COMMUNITY): Payer: Medicare Other

## 2012-09-23 ENCOUNTER — Encounter (HOSPITAL_COMMUNITY): Payer: Self-pay | Admitting: *Deleted

## 2012-09-23 ENCOUNTER — Inpatient Hospital Stay (HOSPITAL_COMMUNITY)
Admission: EM | Admit: 2012-09-23 | Discharge: 2012-09-25 | DRG: 191 | Disposition: A | Discharge status: Home / Self Care | Payer: Medicare Other | Attending: Internal Medicine | Admitting: Internal Medicine

## 2012-09-23 DIAGNOSIS — Z888 Allergy status to other drugs, medicaments and biological substances status: Secondary | ICD-10-CM

## 2012-09-23 DIAGNOSIS — M129 Arthropathy, unspecified: Secondary | ICD-10-CM | POA: Diagnosis present

## 2012-09-23 DIAGNOSIS — R05 Cough: Secondary | ICD-10-CM

## 2012-09-23 DIAGNOSIS — R1110 Vomiting, unspecified: Secondary | ICD-10-CM | POA: Diagnosis present

## 2012-09-23 DIAGNOSIS — T502X5A Adverse effect of carbonic-anhydrase inhibitors, benzothiadiazides and other diuretics, initial encounter: Secondary | ICD-10-CM | POA: Diagnosis present

## 2012-09-23 DIAGNOSIS — Z87891 Personal history of nicotine dependence: Secondary | ICD-10-CM

## 2012-09-23 DIAGNOSIS — E860 Dehydration: Secondary | ICD-10-CM | POA: Diagnosis present

## 2012-09-23 DIAGNOSIS — J45901 Unspecified asthma with (acute) exacerbation: Principal | ICD-10-CM | POA: Diagnosis present

## 2012-09-23 DIAGNOSIS — Z87898 Personal history of other specified conditions: Secondary | ICD-10-CM

## 2012-09-23 DIAGNOSIS — N179 Acute kidney failure, unspecified: Secondary | ICD-10-CM | POA: Diagnosis present

## 2012-09-23 DIAGNOSIS — F411 Generalized anxiety disorder: Secondary | ICD-10-CM | POA: Diagnosis present

## 2012-09-23 DIAGNOSIS — E871 Hypo-osmolality and hyponatremia: Secondary | ICD-10-CM | POA: Diagnosis present

## 2012-09-23 DIAGNOSIS — J441 Chronic obstructive pulmonary disease with (acute) exacerbation: Principal | ICD-10-CM | POA: Diagnosis present

## 2012-09-23 DIAGNOSIS — Z8711 Personal history of peptic ulcer disease: Secondary | ICD-10-CM

## 2012-09-23 DIAGNOSIS — R519 Headache, unspecified: Secondary | ICD-10-CM | POA: Clinically undetermined

## 2012-09-23 DIAGNOSIS — K219 Gastro-esophageal reflux disease without esophagitis: Secondary | ICD-10-CM | POA: Diagnosis present

## 2012-09-23 DIAGNOSIS — Z880 Allergy status to penicillin: Secondary | ICD-10-CM

## 2012-09-23 DIAGNOSIS — E785 Hyperlipidemia, unspecified: Secondary | ICD-10-CM | POA: Diagnosis present

## 2012-09-23 DIAGNOSIS — N183 Chronic kidney disease, stage 3 unspecified: Secondary | ICD-10-CM | POA: Diagnosis present

## 2012-09-23 DIAGNOSIS — M810 Age-related osteoporosis without current pathological fracture: Secondary | ICD-10-CM | POA: Diagnosis present

## 2012-09-23 DIAGNOSIS — R51 Headache: Secondary | ICD-10-CM | POA: Diagnosis not present

## 2012-09-23 DIAGNOSIS — Z7982 Long term (current) use of aspirin: Secondary | ICD-10-CM

## 2012-09-23 DIAGNOSIS — R0902 Hypoxemia: Secondary | ICD-10-CM | POA: Diagnosis present

## 2012-09-23 DIAGNOSIS — J45909 Unspecified asthma, uncomplicated: Secondary | ICD-10-CM | POA: Diagnosis present

## 2012-09-23 DIAGNOSIS — Z9089 Acquired absence of other organs: Secondary | ICD-10-CM

## 2012-09-23 DIAGNOSIS — L270 Generalized skin eruption due to drugs and medicaments taken internally: Secondary | ICD-10-CM | POA: Diagnosis not present

## 2012-09-23 DIAGNOSIS — N289 Disorder of kidney and ureter, unspecified: Secondary | ICD-10-CM

## 2012-09-23 DIAGNOSIS — E669 Obesity, unspecified: Secondary | ICD-10-CM | POA: Diagnosis present

## 2012-09-23 DIAGNOSIS — I10 Essential (primary) hypertension: Secondary | ICD-10-CM | POA: Diagnosis present

## 2012-09-23 DIAGNOSIS — T380X5A Adverse effect of glucocorticoids and synthetic analogues, initial encounter: Secondary | ICD-10-CM | POA: Diagnosis not present

## 2012-09-23 DIAGNOSIS — Z881 Allergy status to other antibiotic agents status: Secondary | ICD-10-CM

## 2012-09-23 DIAGNOSIS — Y92009 Unspecified place in unspecified non-institutional (private) residence as the place of occurrence of the external cause: Secondary | ICD-10-CM

## 2012-09-23 DIAGNOSIS — Z79899 Other long term (current) drug therapy: Secondary | ICD-10-CM

## 2012-09-23 DIAGNOSIS — G9050 Complex regional pain syndrome I, unspecified: Secondary | ICD-10-CM | POA: Diagnosis present

## 2012-09-23 DIAGNOSIS — F3289 Other specified depressive episodes: Secondary | ICD-10-CM | POA: Diagnosis present

## 2012-09-23 DIAGNOSIS — T368X5A Adverse effect of other systemic antibiotics, initial encounter: Secondary | ICD-10-CM | POA: Diagnosis not present

## 2012-09-23 DIAGNOSIS — F329 Major depressive disorder, single episode, unspecified: Secondary | ICD-10-CM | POA: Diagnosis present

## 2012-09-23 DIAGNOSIS — R058 Other specified cough: Secondary | ICD-10-CM

## 2012-09-23 LAB — CBC WITH DIFFERENTIAL/PLATELET
Basophils Absolute: 0 10*3/uL (ref 0.0–0.1)
Basophils Relative: 0 % (ref 0–1)
Eosinophils Absolute: 0.1 10*3/uL (ref 0.0–0.7)
Eosinophils Relative: 1 % (ref 0–5)
HCT: 40.8 % (ref 36.0–46.0)
Hemoglobin: 13.5 g/dL (ref 12.0–15.0)
Lymphocytes Relative: 8 % — ABNORMAL LOW (ref 12–46)
Lymphs Abs: 0.8 10*3/uL (ref 0.7–4.0)
MCH: 29 pg (ref 26.0–34.0)
MCHC: 33.1 g/dL (ref 30.0–36.0)
MCV: 87.6 fL (ref 78.0–100.0)
Monocytes Absolute: 0.7 10*3/uL (ref 0.1–1.0)
Monocytes Relative: 8 % (ref 3–12)
Neutro Abs: 7.7 10*3/uL (ref 1.7–7.7)
Neutrophils Relative %: 82 % — ABNORMAL HIGH (ref 43–77)
Platelets: 277 10*3/uL (ref 150–400)
RBC: 4.66 MIL/uL (ref 3.87–5.11)
RDW: 14 % (ref 11.5–15.5)
WBC: 9.3 10*3/uL (ref 4.0–10.5)

## 2012-09-23 LAB — BASIC METABOLIC PANEL
BUN: 19 mg/dL (ref 6–23)
CO2: 26 mEq/L (ref 19–32)
Calcium: 9.5 mg/dL (ref 8.4–10.5)
Chloride: 93 mEq/L — ABNORMAL LOW (ref 96–112)
Creatinine, Ser: 1.25 mg/dL — ABNORMAL HIGH (ref 0.50–1.10)
GFR calc Af Amer: 48 mL/min — ABNORMAL LOW (ref >90)
GFR calc non Af Amer: 41 mL/min — ABNORMAL LOW (ref >90)
Glucose, Bld: 123 mg/dL — ABNORMAL HIGH (ref 70–99)
Potassium: 3.6 mEq/L (ref 3.5–5.1)
Sodium: 131 mEq/L — ABNORMAL LOW (ref 135–145)

## 2012-09-23 MED ORDER — PREDNISONE 50 MG PO TABS
60.0000 mg | ORAL_TABLET | Freq: Once | ORAL | Status: AC
  Administered 2012-09-23: 60 mg via ORAL
  Filled 2012-09-23: qty 1

## 2012-09-23 MED ORDER — DEXTROSE 5 % IV SOLN
500.0000 mg | INTRAVENOUS | Status: DC
  Administered 2012-09-23 – 2012-09-24 (×2): 500 mg via INTRAVENOUS
  Filled 2012-09-23 (×4): qty 500

## 2012-09-23 MED ORDER — IPRATROPIUM BROMIDE 0.02 % IN SOLN
0.5000 mg | RESPIRATORY_TRACT | Status: AC
  Administered 2012-09-23: 0.5 mg via RESPIRATORY_TRACT
  Filled 2012-09-23: qty 2.5

## 2012-09-23 MED ORDER — GUAIFENESIN ER 600 MG PO TB12
600.0000 mg | ORAL_TABLET | Freq: Two times a day (BID) | ORAL | Status: DC
  Administered 2012-09-23 – 2012-09-25 (×4): 600 mg via ORAL
  Filled 2012-09-23 (×4): qty 1

## 2012-09-23 MED ORDER — ONDANSETRON HCL 4 MG/2ML IJ SOLN
INTRAMUSCULAR | Status: AC
  Administered 2012-09-23: 4 mg
  Filled 2012-09-23: qty 2

## 2012-09-23 MED ORDER — IPRATROPIUM BROMIDE 0.02 % IN SOLN
0.5000 mg | Freq: Four times a day (QID) | RESPIRATORY_TRACT | Status: DC
  Administered 2012-09-24 – 2012-09-25 (×5): 0.5 mg via RESPIRATORY_TRACT
  Filled 2012-09-23 (×5): qty 2.5

## 2012-09-23 MED ORDER — ONDANSETRON HCL 4 MG/2ML IJ SOLN: 4.0000 mg | Freq: Four times a day (QID) | INTRAMUSCULAR | Status: DC | PRN

## 2012-09-23 MED ORDER — FLUOXETINE HCL 20 MG PO CAPS
40.0000 mg | ORAL_CAPSULE | Freq: Every day | ORAL | Status: DC
  Administered 2012-09-24 – 2012-09-25 (×2): 40 mg via ORAL
  Filled 2012-09-23 (×2): qty 2

## 2012-09-23 MED ORDER — METHYLPREDNISOLONE SODIUM SUCC 125 MG IJ SOLR
60.0000 mg | Freq: Once | INTRAMUSCULAR | Status: AC
  Administered 2012-09-23: 60 mg via INTRAVENOUS
  Filled 2012-09-23: qty 2

## 2012-09-23 MED ORDER — ALBUTEROL SULFATE (5 MG/ML) 0.5% IN NEBU: 2.5000 mg | INHALATION_SOLUTION | RESPIRATORY_TRACT | Status: DC | PRN

## 2012-09-23 MED ORDER — DIPHENHYDRAMINE HCL 50 MG/ML IJ SOLN
25.0000 mg | Freq: Four times a day (QID) | INTRAMUSCULAR | Status: DC | PRN
  Administered 2012-09-24: 25 mg via INTRAVENOUS
  Filled 2012-09-23: qty 1

## 2012-09-23 MED ORDER — AMLODIPINE BESYLATE 5 MG PO TABS
5.0000 mg | ORAL_TABLET | Freq: Every day | ORAL | Status: DC
  Administered 2012-09-24: 5 mg via ORAL
  Filled 2012-09-23: qty 1

## 2012-09-23 MED ORDER — METHYLPREDNISOLONE SODIUM SUCC 125 MG IJ SOLR
60.0000 mg | Freq: Four times a day (QID) | INTRAMUSCULAR | Status: DC
  Administered 2012-09-24 (×2): 60 mg via INTRAVENOUS
  Filled 2012-09-23 (×2): qty 2

## 2012-09-23 MED ORDER — ACETAMINOPHEN 325 MG PO TABS
650.0000 mg | ORAL_TABLET | Freq: Four times a day (QID) | ORAL | Status: DC | PRN
  Administered 2012-09-24: 650 mg via ORAL
  Filled 2012-09-23: qty 2

## 2012-09-23 MED ORDER — ASPIRIN 81 MG PO CHEW
81.0000 mg | CHEWABLE_TABLET | Freq: Every day | ORAL | Status: DC
  Administered 2012-09-24 – 2012-09-25 (×2): 81 mg via ORAL
  Filled 2012-09-23 (×2): qty 1

## 2012-09-23 MED ORDER — SODIUM CHLORIDE 0.9 % IJ SOLN
3.0000 mL | Freq: Two times a day (BID) | INTRAMUSCULAR | Status: DC
  Administered 2012-09-24: 3 mL via INTRAVENOUS

## 2012-09-23 MED ORDER — ALBUTEROL SULFATE (5 MG/ML) 0.5% IN NEBU: 5.0000 mg | INHALATION_SOLUTION | RESPIRATORY_TRACT | Status: DC

## 2012-09-23 MED ORDER — ALBUTEROL SULFATE (5 MG/ML) 0.5% IN NEBU
2.5000 mg | INHALATION_SOLUTION | Freq: Four times a day (QID) | RESPIRATORY_TRACT | Status: DC
  Administered 2012-09-24 – 2012-09-25 (×5): 2.5 mg via RESPIRATORY_TRACT
  Filled 2012-09-23 (×5): qty 0.5

## 2012-09-23 MED ORDER — LOSARTAN POTASSIUM 50 MG PO TABS
100.0000 mg | ORAL_TABLET | Freq: Every evening | ORAL | Status: DC
  Administered 2012-09-23 – 2012-09-24 (×2): 100 mg via ORAL
  Filled 2012-09-23 (×2): qty 2

## 2012-09-23 MED ORDER — HYDROCHLOROTHIAZIDE 25 MG PO TABS
12.5000 mg | ORAL_TABLET | Freq: Every day | ORAL | Status: DC
  Administered 2012-09-24 – 2012-09-25 (×2): 12.5 mg via ORAL
  Filled 2012-09-23 (×2): qty 1

## 2012-09-23 MED ORDER — DEXTROSE 5 % IV SOLN
INTRAVENOUS | Status: AC
  Filled 2012-09-23: qty 500

## 2012-09-23 MED ORDER — IPRATROPIUM BROMIDE 0.02 % IN SOLN: 0.5000 mg | RESPIRATORY_TRACT | Status: DC

## 2012-09-23 MED ORDER — ENOXAPARIN SODIUM 40 MG/0.4ML ~~LOC~~ SOLN
40.0000 mg | SUBCUTANEOUS | Status: DC
  Administered 2012-09-23 – 2012-09-24 (×2): 40 mg via SUBCUTANEOUS
  Filled 2012-09-23 (×2): qty 0.4

## 2012-09-23 MED ORDER — ONDANSETRON HCL 4 MG PO TABS: 4.0000 mg | ORAL_TABLET | Freq: Four times a day (QID) | ORAL | Status: DC | PRN

## 2012-09-23 MED ORDER — VITAMIN D 1000 UNITS PO TABS
1000.0000 [IU] | ORAL_TABLET | Freq: Every day | ORAL | Status: DC
  Administered 2012-09-24 – 2012-09-25 (×2): 1000 [IU] via ORAL
  Filled 2012-09-23 (×2): qty 1

## 2012-09-23 MED ORDER — ALBUTEROL SULFATE (5 MG/ML) 0.5% IN NEBU
5.0000 mg | INHALATION_SOLUTION | RESPIRATORY_TRACT | Status: AC
  Administered 2012-09-23: 5 mg via RESPIRATORY_TRACT
  Filled 2012-09-23: qty 1

## 2012-09-23 MED ORDER — SIMVASTATIN 20 MG PO TABS
20.0000 mg | ORAL_TABLET | Freq: Every day | ORAL | Status: DC
  Administered 2012-09-23 – 2012-09-24 (×2): 20 mg via ORAL
  Filled 2012-09-23 (×2): qty 1

## 2012-09-23 MED ORDER — IBUPROFEN 800 MG PO TABS
400.0000 mg | ORAL_TABLET | Freq: Four times a day (QID) | ORAL | Status: DC | PRN
  Administered 2012-09-24 (×2): 400 mg via ORAL
  Filled 2012-09-23 (×2): qty 1

## 2012-09-23 MED ORDER — DIPHENHYDRAMINE HCL 50 MG/ML IJ SOLN
INTRAMUSCULAR | Status: AC
  Administered 2012-09-23: 25 mg
  Filled 2012-09-23: qty 1

## 2012-09-23 MED ORDER — SODIUM CHLORIDE 0.9 % IV SOLN
INTRAVENOUS | Status: AC
  Administered 2012-09-23: 23:00:00 via INTRAVENOUS

## 2012-09-23 MED ORDER — ACETAMINOPHEN 650 MG RE SUPP: 650.0000 mg | Freq: Four times a day (QID) | RECTAL | Status: DC | PRN

## 2012-09-23 MED ORDER — ADULT MULTIVITAMIN W/MINERALS CH
1.0000 | ORAL_TABLET | Freq: Every day | ORAL | Status: DC
  Administered 2012-09-24 – 2012-09-25 (×2): 1 via ORAL
  Filled 2012-09-23 (×2): qty 1

## 2012-09-23 MED ORDER — PANTOPRAZOLE SODIUM 40 MG PO TBEC
80.0000 mg | DELAYED_RELEASE_TABLET | Freq: Every day | ORAL | Status: DC
  Administered 2012-09-24 – 2012-09-25 (×2): 80 mg via ORAL
  Filled 2012-09-23 (×2): qty 2

## 2012-09-23 MED ORDER — LEVOFLOXACIN IN D5W 750 MG/150ML IV SOLN
750.0000 mg | Freq: Once | INTRAVENOUS | Status: AC
  Administered 2012-09-23: 750 mg via INTRAVENOUS
  Filled 2012-09-23: qty 150

## 2012-09-23 MED ORDER — GUAIFENESIN-DM 100-10 MG/5ML PO SYRP
5.0000 mL | ORAL_SOLUTION | ORAL | Status: DC | PRN
  Administered 2012-09-23 – 2012-09-25 (×5): 5 mL via ORAL
  Filled 2012-09-23 (×5): qty 5

## 2012-09-23 NOTE — ED Notes (Signed)
During ambulation pt's O2 dropped to 82% and pt stated her breathing became more difficult. Upon return to room I applied 2.5L O2 and pt's level returned to 96%. [place], MD notified.

## 2012-09-23 NOTE — ED Notes (Signed)
Patient broke out in rash from antibiotic at iv site. Benadryl given as ordered

## 2012-09-23 NOTE — ED Notes (Signed)
Fever, cough, yellow sputum.  No NVD

## 2012-09-23 NOTE — ED Provider Notes (Signed)
History   This chart was scribed for Deanna Skene, MD by Deanna Acosta, ED Scribe. The patient was seen in room [facility] and the patient's care was started at 4:43PM.     CSN: [id number]  Arrival date & time 09/23/12  1615   First MD Initiated Contact with Patient 09/23/12 1643      Chief Complaint  Patient presents with  . Fever    (Consider location/radiation/quality/duration/timing/severity/associated sxs/prior treatment) The history is provided by the patient. No language interpreter was used.    Deanna Acosta is a 76 y.o. female , with a hx of asthma and osteoarthritis, who presents to the Emergency Department complaining of sudden, moderate, fever (98.9 taken at [facility] today, 09/23/12), onset two days ago (09/21/12).  Associated symptoms include productive yellow cough, headache, sinus pressure, rhinorrhea, chest pain, back pain, leg pain located at the bilateral lower extremities, light headedness, shortness of breath, and wheezing. The pt reports she has not experienced any sharp, tearing chest pains. The pt has taken doxycycline which does not provide relief of fever nor chest pain for 1 day.  Pt was taking doxycycline and completed a 10 day course 2-3 weeks ago from her primary care physician.  The pt denies abdominal pain, nausea, and vomiting. In addition, the pt denies any hx of cardiac disease.    The pt does not smoke nor drink alcohol, however, she was a smoker in the past. The pt is unaware of the date of her last tetanus immunization.   PCP is Dr. Hyacinth Meeker at [facility].    Past Medical History  Diagnosis Date  . Allergy     seasonal  . Depression   . Anxiety   . Arthritis   . Asthma     bronchitis  . GERD (gastroesophageal reflux disease)   . Hyperlipidemia   . Hypertension   . Osteoporosis   . Ulcer     H-pylorie treated  . RSD (reflex sympathetic dystrophy)     Past Surgical History  Procedure Date  . Rotator cuff repair 2009  . Cholecystectomy 2000   . Orif distal radius fracture 2002    plate  . Total abdominal hysterectomy 1980    fibroids  . Knee arthroscopy     right  . Elbow arthroscopy     tendonitis/right  . Tonsillectomy and adenoidectomy   . Ganglion cyst excision     left wrist  . Knee ligament reconstruction     right/water ski accident    Family History  Problem Relation Age of Onset  . Ulcers Father     bleeding/gastric  . Colon cancer Neg Hx   . Esophageal cancer Neg Hx   . Stomach cancer Neg Hx     History  Substance Use Topics  . Smoking status: Former Games developer  . Smokeless tobacco: Never Used  . Alcohol Use: No    OB History    Grav Para Term Preterm Abortions TAB SAB Ect Mult Living                  Review of Systems  At least 10pt or greater review of systems completed and are negative except where specified in the HPI.   Allergies  Penicillins and Prednisone  Home Medications   Current Outpatient Rx  Name  Route  Sig  Dispense  Refill  . ALBUTEROL SULFATE HFA 108 (90 BASE) MCG/ACT IN AERS   Inhalation   Inhale 2 puffs into the lungs every 6 (six) hours  as needed. Wheezing and shortness of breath         . ALENDRONATE SODIUM 70 MG PO TABS   Oral   Take 70 mg by mouth every 7 (seven) days. Take with a full glass of water on an empty stomach.         . AMLODIPINE BESYLATE 5 MG PO TABS   Oral   Take 5 mg by mouth daily with lunch.         . ASPIRIN 325 MG PO TABS   Oral   Take 325-650 mg by mouth daily as needed. For knee pain         . VITAMIN D 1000 UNITS PO TABS   Oral   Take 1,000 Units by mouth daily.           Marland Kitchen FLUOXETINE HCL 40 MG PO CAPS   Oral   Take 40 mg by mouth daily before breakfast.          . HYDROCHLOROTHIAZIDE 12.5 MG PO TABS   Oral   Take 12.5 mg by mouth daily before breakfast.          . IBUPROFEN 200 MG PO TABS   Oral   Take 400 mg by mouth every 6 (six) hours as needed. Pain         . LOSARTAN POTASSIUM 100 MG PO TABS   Oral    Take 100 mg by mouth every evening.          Marland Kitchen LOVASTATIN 40 MG PO TABS   Oral   Take 40 mg by mouth every evening.         . ADULT MULTIVITAMIN W/MINERALS CH   Oral   Take 1 tablet by mouth daily.         Marland Kitchen OMEPRAZOLE 40 MG PO CPDR   Oral   Take 40 mg by mouth daily.           . ASPIRIN 81 MG PO TABS   Oral   Take 81 mg by mouth daily.             BP 148/64  Pulse 105  Temp 98.9 F (37.2 C) (Oral)  Resp 20  Ht 5' 4.5" (1.638 m)  Wt 200 lb (90.719 kg)  BMI 33.80 kg/m2  SpO2 97%  Physical Exam  Nursing notes reviewed.  Electronic medical record reviewed. VITAL SIGNS:   Filed Vitals:   09/23/12 1635 09/23/12 1705  BP: 148/64   Pulse: 105   Temp: 98.9 F (37.2 C)   TempSrc: Oral   Resp: 20   Height: 5' 4.5" (1.638 m)   Weight: 200 lb (90.719 kg)   SpO2: 97% 98%   CONSTITUTIONAL: Awake, oriented, appears non-toxic HENT: Atraumatic, normocephalic, oral mucosa pink and moist, airway patent. Pharynx mildly erythematous. Clear nasal drainage from nose, turbinates edematous. External ears normal. EYES: Conjunctiva clear, EOMI, PERRLA NECK: Trachea midline, non-tender, supple CARDIOVASCULAR: Normal heart rate, Normal rhythm, No murmurs, rubs, gallops PULMONARY/CHEST: Decreased breath sounds bilaterally. Wheezing bilaterally. Rhonchi loudest in the bases. ABDOMINAL: Non-distended, obese, soft, non-tender - no rebound or guarding.  BS normal. NEUROLOGIC: Non-focal, moving all four extremities, no gross sensory or motor deficits. EXTREMITIES: No clubbing, cyanosis, or edema SKIN: Warm, Dry, No erythema, No rash  ED Course  Procedures (including critical care time)  Date: 09/23/2012  Rate: 109  Rhythm: Sinus tachycardia normal sinus rhythm  QRS Axis: normal  Intervals: normal  ST/T Wave abnormalities: normal  Conduction Disutrbances:  none  Narrative Interpretation: Sinus tachycardia, possible inferior infarction age indeterminate Q waves in 3 and aVF.   Patient tachycardic since last EKG dated 06/29/2009.     DIAGNOSTIC STUDIES: Oxygen Saturation is 97% on room air, normal by my interpretation.   Patient desaturated to 82% when ambulating - vision not on oxygen at home  COORDINATION OF CARE:  4:51 PM- Treatment plan concerning chest x-ray and application of breathing treatment discussed with patient. Pt agrees with treatment.  6:34 PM- Recheck. Pt reports she is feeling slightly better. Pt's breathing sounds better. Treatment plan concerning chest x-ray results and application of steroids discussed with patient. Pt agrees with treatment.  7:38PM- Phone consultation with Dr. Al Corpus concerning pt condition , treatment, and admission.   Results for orders placed during the hospital encounter of 09/23/12  CBC WITH DIFFERENTIAL      Component Value Range   WBC 9.3  4.0 - 10.5 K/uL   RBC 4.66  3.87 - 5.11 MIL/uL   Hemoglobin 13.5  12.0 - 15.0 g/dL   HCT 16.1  09.6 - 04.5 %   MCV 87.6  78.0 - 100.0 fL   MCH 29.0  26.0 - 34.0 pg   MCHC 33.1  30.0 - 36.0 g/dL   RDW 40.9  81.1 - 91.4 %   Platelets 277  150 - 400 K/uL   Neutrophils Relative 82 (*) 43 - 77 %   Neutro Abs 7.7  1.7 - 7.7 K/uL   Lymphocytes Relative 8 (*) 12 - 46 %   Lymphs Abs 0.8  0.7 - 4.0 K/uL   Monocytes Relative 8  3 - 12 %   Monocytes Absolute 0.7  0.1 - 1.0 K/uL   Eosinophils Relative 1  0 - 5 %   Eosinophils Absolute 0.1  0.0 - 0.7 K/uL   Basophils Relative 0  0 - 1 %   Basophils Absolute 0.0  0.0 - 0.1 K/uL  BASIC METABOLIC PANEL      Component Value Range   Sodium 131 (*) 135 - 145 mEq/L   Potassium 3.6  3.5 - 5.1 mEq/L   Chloride 93 (*) 96 - 112 mEq/L   CO2 26  19 - 32 mEq/L   Glucose, Bld 123 (*) 70 - 99 mg/dL   BUN 19  6 - 23 mg/dL   Creatinine, Ser 7.82 (*) 0.50 - 1.10 mg/dL   Calcium 9.5  8.4 - 95.6 mg/dL   GFR calc non Af Amer 41 (*) >90 mL/min   GFR calc Af Amer 48 (*) >90 mL/min   Dg Chest 2 View  09/23/2012  *RADIOLOGY REPORT*  Clinical  Data: Fever  CHEST - 2 VIEW  Comparison: 09/13/2012  Findings: Cardiac enlargement with normal vascularity.  Lungs are clear without infiltrate or effusion.  Negative for pneumonia or mass lesion.  COPD with hyperinflation of the lungs.  IMPRESSION: COPD.  No acute cardiopulmonary abnormality.   Original Report Authenticated By: Janeece Riggers, M.D.    1. COPD with exacerbation   2. History of fever   3. Productive cough   4. Hypoxia       MDM  SHRAVYA WICKWIRE is a 76 y.o. female without a diagnosis of COPD, she presents with a few days history of worsening cough, chest pain on coughing, upper respiratory type symptoms with nasal drainage, coryza, sinus pressure in the maxillary sinuses and fever. She's had a productive cough of yellow sputum. She had no other symptoms, no history of coronary  artery disease, no history of having blood clots. She is tachycardic but that is from receiving albuterol-still has a Wells score of 14 tachycardia she is low risk for PE and does not require any further testing. Patient's lungs are significantly abnormal on auscultation.  Chest x-ray is normal. She's not have an elevated white count. This is likely a COPD exacerbation.  Patient's given albuterol and Atrovent and improved greatly with her lung sounds however she still desaturates to about 82% on ambulation. We'll give her some steroids as well. She says she's allergic to prednisone however this is an intolerance-she got headache the last time, I think the risks of headache do not outweigh the benefits of improved breathing.  Discussed with hospitalist for admission.  Dr. Al Corpus requested some IV steroids, added 60 mg of IV Solu-Medrol.  I personally performed the services described in this documentation, which was scribed in my presence. The recorded information has been reviewed and is accurate. Deanna Acosta, M.D.      Deanna Skene, MD 09/23/12 2312

## 2012-09-23 NOTE — H&P (Addendum)
[facility] History and Physical  QUANESHA KLIMASZEWSKI ZOX:[id number] DOB: 1936-03-29 DOA: 09/23/2012  Referring physician: Dr. [name] PCP: [name], MD  Specialists: None  Chief Complaint: Productive cough, difficulty breathing and wheezing.  HPI: Deanna Acosta is a 76 y.o. female with history of HTN, HL , ex-smoker, asthma/? COPD, presented to the emergency department on 12/19 do to 2 days history of worsening cough, difficulty breathing and wheezing. She was treated approximately 3 weeks ago for similar complaints when she had cough productive of yellow sputum, fevers-patient never checked but indicates not high, difficulty breathing and wheezing. She was given prednisone and doxycycline. After a single dose of prednisone she complained of headache and did not take anymore. She completed the doxycycline. Although she felt better, her symptoms did not completely resolve. 2 days back she started noticing worsening dyspnea, wheezing, tightness of chest, cough with intermittent yellow-white sputum. She denies chest pain. Feels hot but has not checked temperature. In the ED was found to be hypoxemic at 82% on room air with activity. She was given a dose of Solu-Medrol and prednisone. She was started on IV Levaquin but developed a rash on the forearm midway. She indicates that her breathing is already some better. She is asking when she can go home and if she can get her dog in the hospital. She had one episode of vomiting after coughing but did not have any blood or coffee grounds. No history of sickly contacts or recent travel.   Review of Systems: All systems reviewed and apart from history of presenting illness, are negative  Past Medical History  Diagnosis Date  . Allergy     seasonal  . Depression   . Anxiety   . Arthritis   . Asthma     bronchitis  . GERD (gastroesophageal reflux disease)   . Hyperlipidemia   . Hypertension   . Osteoporosis   . Ulcer      H-pylorie treated  . RSD (reflex sympathetic dystrophy)    Past Surgical History  Procedure Date  . Rotator cuff repair 2009  . Cholecystectomy 2000  . Orif distal radius fracture 2002    plate  . Total abdominal hysterectomy 1980    fibroids  . Knee arthroscopy     right  . Elbow arthroscopy     tendonitis/right  . Tonsillectomy and adenoidectomy   . Ganglion cyst excision     left wrist  . Knee ligament reconstruction     right/water ski accident   Social History:  reports that she has quit smoking. She has never used smokeless tobacco. She reports that she does not drink alcohol or use illicit drugs. Married. Independent of activities of daily living. At least a 60 pack year smoking history-quit smoking 20-30 years ago. Has taken flu shot this season.  Allergies  Allergen Reactions  . Penicillins Hives  . Prednisone Other (See Comments)    Severe Headache and Flushing  . Levofloxacin Rash    Family History  Problem Relation Age of Onset  . Ulcers Father     bleeding/gastric  . Colon cancer Neg Hx   . Esophageal cancer Neg Hx   . Stomach cancer Neg Hx     Prior to Admission medications   Medication Sig Start Date End Date Taking? Authorizing Provider  albuterol (PROVENTIL HFA;VENTOLIN HFA) 108 (90 BASE) MCG/ACT inhaler Inhale 2 puffs into the lungs every 6 (six) hours as needed. Wheezing and shortness of breath   Yes Historical  Provider, MD  alendronate (FOSAMAX) 70 MG tablet Take 70 mg by mouth every 7 (seven) days. Take with a full glass of water on an empty stomach.   Yes Historical Provider, MD  amLODipine (NORVASC) 5 MG tablet Take 5 mg by mouth daily with lunch.   Yes Historical Provider, MD  aspirin 325 MG tablet Take 325-650 mg by mouth daily as needed. For knee pain   Yes Historical Provider, MD  cholecalciferol (VITAMIN D) 1000 UNITS tablet Take 1,000 Units by mouth daily.     Yes Historical Provider, MD  FLUoxetine (PROZAC) 40 MG capsule Take 40 mg by  mouth daily before breakfast.    Yes Historical Provider, MD  hydrochlorothiazide (HYDRODIURIL) 12.5 MG tablet Take 12.5 mg by mouth daily before breakfast.  09/13/12  Yes Historical Provider, MD  ibuprofen (ADVIL,MOTRIN) 200 MG tablet Take 400 mg by mouth every 6 (six) hours as needed. Pain   Yes Historical Provider, MD  losartan (COZAAR) 100 MG tablet Take 100 mg by mouth every evening.    Yes Historical Provider, MD  lovastatin (MEVACOR) 40 MG tablet Take 40 mg by mouth every evening.   Yes Historical Provider, MD  Multiple Vitamin (MULTIVITAMIN WITH MINERALS) TABS Take 1 tablet by mouth daily.   Yes Historical Provider, MD  omeprazole (PRILOSEC) 40 MG capsule Take 40 mg by mouth daily.     Yes Historical Provider, MD  aspirin 81 MG tablet Take 81 mg by mouth daily.      Historical Provider, MD   Physical Exam: Filed Vitals:   09/23/12 1635 09/23/12 1705 09/23/12 2127  BP: 148/64  151/70  Pulse: 105  108  Temp: 98.9 F (37.2 C)  98.8 F (37.1 C)  TempSrc: Oral  Oral  Resp: 20  20  Height: 5' 4.5" (1.638 m)    Weight: 90.719 kg (200 lb)    SpO2: 97% 98% 95%     General exam: Moderately built and nourished female patient lying propped up on the gurney with mild increased work of breathing.  Head, eyes and ENT: Nontraumatic and normocephalic. Pupils equally reacting to light and accommodation. Oral mucosa is mildly dry.  Neck: Supple. No JVD, carotid bruits or thyromegaly.  Lymphatics: No lymphadenopathy.  Respiratory system: Reduced breath sounds bilaterally with bilateral expiratory medium pitched wheezing and rhonchi. Mild increased work of breathing. No crackles. No stridor. Able to speak in full sentences.  Cardiovascular system: First and second heart sounds heard, regular rate and rhythm. No JVD, murmurs, gallops or pedal edema.  Central nervous system: Alert and oriented. No focal neurological deficits.  Extremities: Symmetric 5 x 5 power. Peripheral pulses symmetrically  felt.  Skin: Without acute findings.  Musculoskeletal system: Negative exam.  Labs on Admission:  Basic Metabolic Panel:  Lab 09/23/12 1610  NA 131*  K 3.6  CL 93*  CO2 26  GLUCOSE 123*  BUN 19  CREATININE 1.25*  CALCIUM 9.5  MG --  PHOS --   Liver Function Tests: No results found for this basename: AST:5,ALT:5,ALKPHOS:5,BILITOT:5,PROT:5,ALBUMIN:5 in the last 168 hours No results found for this basename: LIPASE:5,AMYLASE:5 in the last 168 hours No results found for this basename: AMMONIA:5 in the last 168 hours CBC:  Lab 09/23/12 1658  WBC 9.3  NEUTROABS 7.7  HGB 13.5  HCT 40.8  MCV 87.6  PLT 277   Cardiac Enzymes: No results found for this basename: CKTOTAL:5,CKMB:5,CKMBINDEX:5,TROPONINI:5 in the last 168 hours  BNP (last 3 results) No results found for this basename:  PROBNP:3 in the last 8760 hours CBG: No results found for this basename: GLUCAP:5 in the last 168 hours  Radiological Exams on Admission: Dg Chest 2 View  09/23/2012  *RADIOLOGY REPORT*  Clinical Data: Fever  CHEST - 2 VIEW  Comparison: 09/13/2012  Findings: Cardiac enlargement with normal vascularity.  Lungs are clear without infiltrate or effusion.  Negative for pneumonia or mass lesion.  COPD with hyperinflation of the lungs.  IMPRESSION: COPD.  No acute cardiopulmonary abnormality.   Original Report Authenticated By: Janeece Riggers, M.D.     EKG: Independently reviewed. Sinus tachycardia to 109 beats per minute, possible left axis deviation, baseline artifacts in lateral leads, Q waves in leads 2 and 3 in no acute changes.  Assessment/Plan Principal Problem:  *COPD exacerbation Active Problems:  Hypoxemia  Vomiting  Dehydration  HTN (hypertension)   1. Likely COPD exacerbation: Admit to telemetry. IV Solu-Medrol, bronchodilator nebulizations, oxygen, flutter valve. Patient recently completed doxycycline and had allergic reaction to Levaquin. Will place on IV azithromycin. Monitor  closely. 2. Hypoxemia: likely due to COPD exacerbation. Management as above. 3. Mild dehydration: Secondary to poor oral intake from problem #1. Brief IV fluids. 4. Mild acute renal failure: Likely secondary to dehydration. Brief IV fluids and follow up BMP in a.m. 5. Hyponatremia: Likely secondary to HCTZ. 6. Hypertension: Continue home medications. Reasonably controlled. 7. Vomiting x1: Likely post tussive.    Code Status: Full Family Communication: Discussed with patient's spouse and daughter at bedside. Disposition Plan: At least 2 midnights. Home in medically stable.  Time spent: 50 minutes  [facility] Pager [phone]  If 7PM-7AM, please contact night-coverage [email or web address] Password [facility] 09/23/2012, 9:44 PM

## 2012-09-24 DIAGNOSIS — R51 Headache: Secondary | ICD-10-CM

## 2012-09-24 DIAGNOSIS — R519 Headache, unspecified: Secondary | ICD-10-CM | POA: Clinically undetermined

## 2012-09-24 LAB — CBC
HCT: 37.4 % (ref 36.0–46.0)
Hemoglobin: 12.3 g/dL (ref 12.0–15.0)
MCH: 28 pg (ref 26.0–34.0)
MCHC: 32.9 g/dL (ref 30.0–36.0)
MCV: 85 fL (ref 78.0–100.0)
Platelets: 259 10*3/uL (ref 150–400)
RBC: 4.4 MIL/uL (ref 3.87–5.11)
RDW: 14 % (ref 11.5–15.5)
WBC: 7.6 10*3/uL (ref 4.0–10.5)

## 2012-09-24 LAB — BASIC METABOLIC PANEL
BUN: 21 mg/dL (ref 6–23)
CO2: 22 mEq/L (ref 19–32)
Calcium: 9.1 mg/dL (ref 8.4–10.5)
Chloride: 98 mEq/L (ref 96–112)
Creatinine, Ser: 1.12 mg/dL — ABNORMAL HIGH (ref 0.50–1.10)
GFR calc Af Amer: 55 mL/min — ABNORMAL LOW (ref >90)
GFR calc non Af Amer: 47 mL/min — ABNORMAL LOW (ref >90)
Glucose, Bld: 199 mg/dL — ABNORMAL HIGH (ref 70–99)
Potassium: 3.6 mEq/L (ref 3.5–5.1)
Sodium: 133 mEq/L — ABNORMAL LOW (ref 135–145)

## 2012-09-24 MED ORDER — TRAZODONE HCL 50 MG PO TABS
50.0000 mg | ORAL_TABLET | Freq: Every day | ORAL | Status: DC
  Administered 2012-09-24: 50 mg via ORAL
  Filled 2012-09-24: qty 1

## 2012-09-24 MED ORDER — OXYCODONE HCL 5 MG PO TABS
5.0000 mg | ORAL_TABLET | Freq: Once | ORAL | Status: AC
  Administered 2012-09-24: 5 mg via ORAL
  Filled 2012-09-24: qty 1

## 2012-09-24 MED ORDER — SODIUM CHLORIDE 0.9 % IV SOLN
INTRAVENOUS | Status: DC
  Administered 2012-09-24: 18:00:00 via INTRAVENOUS

## 2012-09-24 NOTE — Progress Notes (Signed)
Chart reviewed.  Subjective: Breathing better, but complaining of a severe headache rendering her unable to sleep. She reports that whenever she takes steroids, even intra-articular, she gets severe headaches. Unrelated by Tylenol or ibuprofen. Has not ambulated.  Objective: Vital signs in last 24 hours: Filed Vitals:   09/23/12 2253 09/24/12 0152 09/24/12 0447 09/24/12 0934  BP:   138/50   Pulse:   102   Temp:   97.5 F (36.4 C)   TempSrc:   Oral   Resp:   20   Height:      Weight:      SpO2: 95% 95% 94% 92%   Weight change:   Intake/Output Summary (Last 24 hours) at 09/24/12 1347 Last data filed at 09/24/12 1217  Gross per 24 hour  Intake    480 ml  Output      0 ml  Net    480 ml   General: Uncomfortable appearing with hands on her head. Lungs: Some congestion. Good air movement. No wheezing. Breathing nonlabored. Abdomen soft nontender nondistended Extremities no clubbing cyanosis or edema Cardiac: Regular rate rhythm without murmurs gallops rubs  Lab Results: Basic Metabolic Panel:  Lab 09/24/12 1914 09/23/12 1658  NA 133* 131*  K 3.6 3.6  CL 98 93*  CO2 22 26  GLUCOSE 199* 123*  BUN 21 19  CREATININE 1.12* 1.25*  CALCIUM 9.1 9.5  MG -- --  PHOS -- --   Liver Function Tests: No results found for this basename: AST:2,ALT:2,ALKPHOS:2,BILITOT:2,PROT:2,ALBUMIN:2 in the last 168 hours No results found for this basename: LIPASE:2,AMYLASE:2 in the last 168 hours No results found for this basename: AMMONIA:2 in the last 168 hours CBC:  Lab 09/24/12 0511 09/23/12 1658  WBC 7.6 9.3  NEUTROABS -- 7.7  HGB 12.3 13.5  HCT 37.4 40.8  MCV 85.0 87.6  PLT 259 277   Micro Results: Recent Results (from the past 240 hour(s))  CULTURE, BLOOD (ROUTINE X 2)     Status: Normal (Preliminary result)   Collection Time   09/23/12  4:58 PM      Component Value Range Status Comment   Specimen Description BLOOD RIGHT ANTECUBITAL   Final    Special Requests BOTTLES DRAWN  AEROBIC AND ANAEROBIC 6CC   Final    Culture NO GROWTH 1 DAY   Final    Report Status PENDING   Incomplete   CULTURE, BLOOD (ROUTINE X 2)     Status: Normal (Preliminary result)   Collection Time   09/23/12  5:08 PM      Component Value Range Status Comment   Specimen Description BLOOD RIGHT HAND   Final    Special Requests BOTTLES DRAWN AEROBIC AND ANAEROBIC 6CC   Final    Culture NO GROWTH 1 DAY   Final    Report Status PENDING   Incomplete    Studies/Results: Dg Chest 2 View  09/23/2012  *RADIOLOGY REPORT*  Clinical Data: Fever  CHEST - 2 VIEW  Comparison: 09/13/2012  Findings: Cardiac enlargement with normal vascularity.  Lungs are clear without infiltrate or effusion.  Negative for pneumonia or mass lesion.  COPD with hyperinflation of the lungs.  IMPRESSION: COPD.  No acute cardiopulmonary abnormality.   Original Report Authenticated By: Janeece Riggers, M.D.    Scheduled Meds:   . albuterol  2.5 mg Nebulization Q6H  . amLODipine  5 mg Oral Q lunch  . aspirin  81 mg Oral Daily  . azithromycin  500 mg Intravenous Q24H  .  cholecalciferol  1,000 Units Oral Daily  . enoxaparin (LOVENOX) injection  40 mg Subcutaneous Q24H  . FLUoxetine  40 mg Oral QAC breakfast  . guaiFENesin  600 mg Oral BID  . hydrochlorothiazide  12.5 mg Oral QAC breakfast  . ipratropium  0.5 mg Nebulization Q6H  . losartan  100 mg Oral QPM  . methylPREDNISolone (SOLU-MEDROL) injection  60 mg Intravenous Q6H  . multivitamin with minerals  1 tablet Oral Daily  . pantoprazole  80 mg Oral Daily  . simvastatin  20 mg Oral q1800  . sodium chloride  3 mL Intravenous Q12H   Continuous Infusions:  PRN Meds:.acetaminophen, acetaminophen, albuterol, diphenhydrAMINE, guaiFENesin-dextromethorphan, ibuprofen, ondansetron (ZOFRAN) IV, ondansetron Assessment/Plan: Principal Problem:  *COPD exacerbation Active Problems:  Hypoxemia  Dehydration  HTN (hypertension)  Headache  Vomiting  Renal insufficiency  The patient  does not have much bronchospasm currently. She reports a severe headache with steroids. Will stop the Solu-Medrol, continue antibiotics, bronchodilators, wean oxygen, give pain medication and the sleep aid. Continue IV fluid. Discontinue telemetry. Possibly home tomorrow if stable.   LOS: 1 day   Zabian Swayne L 09/24/2012, 1:47 PM

## 2012-09-24 NOTE — Progress Notes (Signed)
UR Chart Review Completed  

## 2012-09-24 NOTE — Progress Notes (Signed)
Patient ambulated in halls. O2 sats stayed at 92% on room air.

## 2012-09-25 DIAGNOSIS — I10 Essential (primary) hypertension: Secondary | ICD-10-CM

## 2012-09-25 MED ORDER — GUAIFENESIN-DM 100-10 MG/5ML PO SYRP: 5.0000 mL | ORAL_SOLUTION | ORAL | Status: DC | PRN

## 2012-09-25 MED ORDER — AZITHROMYCIN 500 MG PO TABS: 500.0000 mg | ORAL_TABLET | Freq: Every day | ORAL | Status: DC

## 2012-09-25 MED ORDER — DEXAMETHASONE 4 MG PO TABS: ORAL_TABLET | ORAL | Status: DC

## 2012-09-25 MED ORDER — ALBUTEROL SULFATE HFA 108 (90 BASE) MCG/ACT IN AERS: 2.0000 | INHALATION_SPRAY | Freq: Four times a day (QID) | RESPIRATORY_TRACT | Status: DC | PRN

## 2012-09-25 NOTE — Discharge Summary (Signed)
Physician Discharge Summary  Deanna Acosta GNF:[id number] DOB: 12/19/1935 DOA: 09/23/2012  PCP: Deanna Labella, MD  Admit date: 09/23/2012 Discharge date: 09/25/2012  Time spent: Greater than 30 minutes  Recommendations for Outpatient Follow-up:  1. Follow with primary care physician in the next week to 2 weeks.   Discharge Diagnoses:  1. COPD exacerbation, improving. 2. Hypertension. 3. Obesity.   Discharge Condition: Stable and improving.  Diet recommendation: Regular.  Filed Weights   09/23/12 1635 09/23/12 2219  Weight: 90.719 kg (200 lb) 98.9 kg (218 lb 0.6 oz)    History of present illness:  This very pleasant 76 year old lady presented to the hospital with symptoms of productive cough and wheezing with dyspnea. Please see initial history as outlined below: HPI: Deanna Acosta is a 76 y.o. female with history of HTN, HL , ex-smoker, asthma/? COPD, presented to the emergency department on 12/19 do to 2 days history of worsening cough, difficulty breathing and wheezing. She was treated approximately 3 weeks ago for similar complaints when she had cough productive of yellow sputum, fevers-patient never checked but indicates not high, difficulty breathing and wheezing. She was given prednisone and doxycycline. After a single dose of prednisone she complained of headache and did not take anymore. She completed the doxycycline. Although she felt better, her symptoms did not completely resolve. 2 days back she started noticing worsening dyspnea, wheezing, tightness of chest, cough with intermittent yellow-white sputum. She denies chest pain. Feels hot but has not checked temperature. In the ED was found to be hypoxemic at 82% on room air with activity. She was given a dose of Solu-Medrol and prednisone. She was started on IV Levaquin but developed a rash on the forearm midway. She indicates that her breathing is already some better. She is asking when she can go home and if she  can get her dog in the hospital. She had one episode of vomiting after coughing but did not have any blood or coffee grounds. No history of sickly contacts or recent travel.  Hospital Course:  Patient was started on intravenous steroids and antibiotics and bronchodilators. She developed a rash with intravenous Levaquin. She did not tolerate oral prednisone. However, she does feel improved with hospitalization and treatment in the last couple of days. She is keen to go home. She is no longer a smoker, quit smoking several years ago.  Procedures:  None.   Consultations:  None.  Discharge Exam: Filed Vitals:   09/24/12 1736 09/24/12 2104 09/25/12 0500 09/25/12 0746  BP:  149/66 118/50   Pulse:  97    Temp:  97.9 F (36.6 C) 98 F (36.7 C)   TempSrc:   Oral   Resp:  20 18   Height:      Weight:      SpO2: 92% 92% 94% 94%    General: She looks systemically well. She is not toxic or septic. There is no increased work of breathing. Cardiovascular: Heart sounds are present without murmurs or gallop rhythm. She is not clinically in heart failure. Respiratory: Lung fields show bilateral widespread wheezing, however it is not tight.  Discharge Instructions  Discharge Orders    Future Orders Please Complete By Expires   Diet - low sodium heart healthy      Increase activity slowly          Medication List     As of 09/25/2012  8:37 AM    TAKE these medications  albuterol 108 (90 BASE) MCG/ACT inhaler   Commonly known as: PROVENTIL HFA;VENTOLIN HFA   Inhale 2 puffs into the lungs every 6 (six) hours as needed. Wheezing and shortness of breath      alendronate 70 MG tablet   Commonly known as: FOSAMAX   Take 70 mg by mouth every 7 (seven) days. Take with a full glass of water on an empty stomach.      amLODipine 5 MG tablet   Commonly known as: NORVASC   Take 5 mg by mouth daily with lunch.      aspirin 81 MG tablet   Take 81 mg by mouth daily.      azithromycin  500 MG tablet   Commonly known as: ZITHROMAX   Take 1 tablet (500 mg total) by mouth daily.      cholecalciferol 1000 UNITS tablet   Commonly known as: VITAMIN D   Take 1,000 Units by mouth daily.      dexamethasone 4 MG tablet   Commonly known as: DECADRON   Take 1 tablet daily for 3 days, then half tablet daily for 3 days, then STOP.      FLUoxetine 40 MG capsule   Commonly known as: PROZAC   Take 40 mg by mouth daily before breakfast.      guaiFENesin-dextromethorphan 100-10 MG/5ML syrup   Commonly known as: ROBITUSSIN DM   Take 5 mLs by mouth every 4 (four) hours as needed for cough.      hydrochlorothiazide 12.5 MG tablet   Commonly known as: HYDRODIURIL   Take 12.5 mg by mouth daily before breakfast.      ibuprofen 200 MG tablet   Commonly known as: ADVIL,MOTRIN   Take 400 mg by mouth every 6 (six) hours as needed. Pain      losartan 100 MG tablet   Commonly known as: COZAAR   Take 100 mg by mouth every evening.      lovastatin 40 MG tablet   Commonly known as: MEVACOR   Take 40 mg by mouth every evening.      multivitamin with minerals Tabs   Take 1 tablet by mouth daily.      omeprazole 40 MG capsule   Commonly known as: PRILOSEC   Take 40 mg by mouth daily.          The results of significant diagnostics from this hospitalization (including imaging, microbiology, ancillary and laboratory) are listed below for reference.    Significant Diagnostic Studies: Dg Chest 2 View  09/23/2012  *RADIOLOGY REPORT*  Clinical Data: Fever  CHEST - 2 VIEW  Comparison: 09/13/2012  Findings: Cardiac enlargement with normal vascularity.  Lungs are clear without infiltrate or effusion.  Negative for pneumonia or mass lesion.  COPD with hyperinflation of the lungs.  IMPRESSION: COPD.  No acute cardiopulmonary abnormality.   Original Report Authenticated By: Deanna Acosta, M.D.     Microbiology: Recent Results (from the past 240 hour(s))  CULTURE, BLOOD (ROUTINE X 2)      Status: Normal (Preliminary result)   Collection Time   09/23/12  4:58 PM      Component Value Range Status Comment   Specimen Description BLOOD RIGHT ANTECUBITAL   Final    Special Requests BOTTLES DRAWN AEROBIC AND ANAEROBIC 6CC   Final    Culture NO GROWTH 2 DAYS   Final    Report Status PENDING   Incomplete   CULTURE, BLOOD (ROUTINE X 2)     Status: Normal (Preliminary  result)   Collection Time   09/23/12  5:08 PM      Component Value Range Status Comment   Specimen Description BLOOD RIGHT HAND   Final    Special Requests BOTTLES DRAWN AEROBIC AND ANAEROBIC [facility]   Final    Culture NO GROWTH 2 DAYS   Final    Report Status PENDING   Incomplete      Labs: Basic Metabolic Panel:  Lab 09/24/12 0981 09/23/12 1658  NA 133* 131*  K 3.6 3.6  CL 98 93*  CO2 22 26  GLUCOSE 199* 123*  BUN 21 19  CREATININE 1.12* 1.25*  CALCIUM 9.1 9.5  MG -- --  PHOS -- --      CBC:  Lab 09/24/12 0511 09/23/12 1658  WBC 7.6 9.3  NEUTROABS -- 7.7  HGB 12.3 13.5  HCT 37.4 40.8  MCV 85.0 87.6  PLT 259 277        Signed:  Samaiya Awadallah C  [facility] 09/25/2012, 8:37 AM

## 2012-09-25 NOTE — Progress Notes (Signed)
Patient with orders to be discharge home. Discharge instructions given, patient verbalized understanding. Patient in stable condition upon discharge. Patient left spouse in private vehicle.

## 2012-09-27 ENCOUNTER — Inpatient Hospital Stay (HOSPITAL_COMMUNITY): Admission: RE | Admit: 2012-09-27 | Payer: Medicare Other | Source: Ambulatory Visit

## 2012-09-28 LAB — CULTURE, BLOOD (ROUTINE X 2)
Culture: NO GROWTH
Culture: NO GROWTH

## 2012-10-04 ENCOUNTER — Inpatient Hospital Stay: Admit: 2012-10-04 | Payer: Medicare Other | Admitting: Orthopedic Surgery

## 2012-10-04 SURGERY — ARTHROPLASTY, KNEE, TOTAL
Anesthesia: Spinal | Site: Knee | Laterality: Left

## 2012-11-01 ENCOUNTER — Encounter (HOSPITAL_COMMUNITY): Admission: RE | Payer: Self-pay | Source: Ambulatory Visit

## 2012-11-01 ENCOUNTER — Ambulatory Visit (HOSPITAL_COMMUNITY): Admission: RE | Admit: 2012-11-01 | Payer: Medicare Other | Source: Ambulatory Visit | Admitting: Orthopedic Surgery

## 2012-11-01 SURGERY — ARTHROPLASTY, KNEE, TOTAL
Anesthesia: Spinal | Site: Knee | Laterality: Right

## 2013-06-10 ENCOUNTER — Other Ambulatory Visit: Payer: Self-pay

## 2013-06-10 DIAGNOSIS — Z1231 Encounter for screening mammogram for malignant neoplasm of breast: Secondary | ICD-10-CM

## 2013-06-14 ENCOUNTER — Other Ambulatory Visit: Payer: Self-pay | Admitting: Physician Assistant

## 2013-06-14 MED ORDER — VANCOMYCIN HCL 10 G IV SOLR: 1000.0000 mg | INTRAVENOUS | Status: AC

## 2013-06-14 MED ORDER — CHLORHEXIDINE GLUCONATE 4 % EX LIQD: 60.0000 mL | Freq: Once | CUTANEOUS | Status: DC

## 2013-06-14 NOTE — H&P (Signed)
Deanna Acosta is an 77 y.o. female.    Chief Complaint: left knee pain  HPI: Pt is a 77 y.o. female complaining of left knee pain pain for multiple years. Pain had continually increased since the beginning. X-rays in the clinic show end-stage arthritic changes of the left knee. Pt has tried various conservative treatments which have failed to alleviate their symptoms, including shots and therapy. Various options are discussed with the patient. Risks, benefits and expectations were discussed with the patient. Patient understand the risks, benefits and expectations and wishes to proceed with surgery.   PCP:  [name], MD  D/C Plans:  Home with HHPT  PMH: Past Medical History  Diagnosis Date  . Allergy     seasonal  . Depression   . Anxiety   . Arthritis   . Asthma     bronchitis  . GERD (gastroesophageal reflux disease)   . Hyperlipidemia   . Hypertension   . Osteoporosis   . Ulcer     H-pylorie treated  . RSD (reflex sympathetic dystrophy)     PSH: Past Surgical History  Procedure Laterality Date  . Rotator cuff repair  2009  . Cholecystectomy  2000  . Orif distal radius fracture  2002    plate  . Total abdominal hysterectomy  1980    fibroids  . Knee arthroscopy      right  . Elbow arthroscopy      tendonitis/right  . Tonsillectomy and adenoidectomy    . Ganglion cyst excision      left wrist  . Knee ligament reconstruction      right/water ski accident    Social History:  reports that she has quit smoking. She has never used smokeless tobacco. She reports that she does not drink alcohol or use illicit drugs.  Allergies:  Allergies  Allergen Reactions  . Penicillins Hives  . Prednisone Other (See Comments)    Severe Headache and Flushing  . Levofloxacin Rash    Medications: Current Outpatient Prescriptions  Medication Sig Dispense Refill  . albuterol (PROVENTIL HFA;VENTOLIN HFA) 108 (90 BASE) MCG/ACT inhaler Inhale 2 puffs into the lungs  every 6 (six) hours as needed. Wheezing and shortness of breath  1 Inhaler  0  . alendronate (FOSAMAX) 70 MG tablet Take 70 mg by mouth every 7 (seven) days. Take with a Acosta glass of water on an empty stomach.      Marland Kitchen amLODipine (NORVASC) 5 MG tablet Take 5 mg by mouth daily with lunch.      Marland Kitchen aspirin 81 MG tablet Take 81 mg by mouth daily.        Marland Kitchen azithromycin (ZITHROMAX) 500 MG tablet Take 1 tablet (500 mg total) by mouth daily.  5 tablet  0  . cholecalciferol (VITAMIN D) 1000 UNITS tablet Take 1,000 Units by mouth daily.        Marland Kitchen dexamethasone (DECADRON) 4 MG tablet Take 1 tablet daily for 3 days, then half tablet daily for 3 days, then STOP.  6 tablet  0  . FLUoxetine (PROZAC) 40 MG capsule Take 40 mg by mouth daily before breakfast.       . guaiFENesin-dextromethorphan (ROBITUSSIN DM) 100-10 MG/5ML syrup Take 5 mLs by mouth every 4 (four) hours as needed for cough.  118 mL  0  . hydrochlorothiazide (HYDRODIURIL) 12.5 MG tablet Take 12.5 mg by mouth daily before breakfast.       . ibuprofen (ADVIL,MOTRIN) 200 MG tablet Take 400 mg by mouth  every 6 (six) hours as needed. Pain      . losartan (COZAAR) 100 MG tablet Take 100 mg by mouth every evening.       . lovastatin (MEVACOR) 40 MG tablet Take 40 mg by mouth every evening.      . Multiple Vitamin (MULTIVITAMIN WITH MINERALS) TABS Take 1 tablet by mouth daily.      Marland Kitchen omeprazole (PRILOSEC) 40 MG capsule Take 40 mg by mouth daily.         Current Facility-Administered Medications  Medication Dose Route Frequency Provider Last Rate Last Dose  . chlorhexidine (HIBICLENS) 4 % liquid 4 application  60 mL Topical Once Thea Gist, PA-C      . [START ON 06/15/2013] vancomycin (VANCOCIN) 1,000 mg in sodium chloride 0.9 % 500 mL IVPB  1,000 mg Intravenous On Call to OR Thea Gist, PA-C        No results found for this or any previous visit (from the past 48 hour(s)). No results found.  ROS: Pain with rom of the left lower  extremity  Physical Exam: BP:   160/82  ;  HR:   72  ; Resp:   14  : Alert and oriented 76 y.o. female in no acute distress Cranial nerves 2-12 intact Cervical spine: Acosta rom with no tenderness, nv intact distally Chest: active breath sounds bilaterally, no wheeze rhonchi or rales Heart: regular rate and rhythm, no murmur Abd: non tender non distended with active bowel sounds Hip is stable with rom  Left knee with moderate crepitus  Mild medial and lateral joint line tenderness nv intact distally No rashes or edema Normal heel toe gait  Assessment/Plan Assessment: left knee end stage osteoarthritis  Plan: Patient will undergo a left total knee arthroplasty by Dr. Ranell Patrick at [facility]. Risks benefits and expectations were discussed with the patient. Patient understand risks, benefits and expectations and wishes to proceed.

## 2013-06-16 ENCOUNTER — Encounter (HOSPITAL_COMMUNITY)
Admission: RE | Admit: 2013-06-16 | Discharge: 2013-06-16 | Disposition: A | Discharge status: Home / Self Care | Payer: Medicare Other | Source: Ambulatory Visit | Attending: Orthopaedic Surgery | Admitting: Orthopaedic Surgery

## 2013-06-16 ENCOUNTER — Encounter (HOSPITAL_COMMUNITY): Payer: Self-pay

## 2013-06-16 ENCOUNTER — Encounter (HOSPITAL_COMMUNITY): Payer: Self-pay | Admitting: Pharmacy Technician

## 2013-06-16 DIAGNOSIS — Z01818 Encounter for other preprocedural examination: Secondary | ICD-10-CM | POA: Insufficient documentation

## 2013-06-16 DIAGNOSIS — Z01812 Encounter for preprocedural laboratory examination: Secondary | ICD-10-CM | POA: Insufficient documentation

## 2013-06-16 HISTORY — DX: Disease of blood and blood-forming organs, unspecified: D75.9

## 2013-06-16 HISTORY — DX: Acute embolism and thrombosis of unspecified vein: I82.90

## 2013-06-16 HISTORY — DX: Chronic obstructive pulmonary disease, unspecified: J44.9

## 2013-06-16 HISTORY — DX: Pneumonia, unspecified organism: J18.9

## 2013-06-16 LAB — CBC
HCT: 40.9 % (ref 36.0–46.0)
Hemoglobin: 13.4 g/dL (ref 12.0–15.0)
MCH: 28.3 pg (ref 26.0–34.0)
MCHC: 32.8 g/dL (ref 30.0–36.0)
MCV: 86.3 fL (ref 78.0–100.0)
Platelets: 288 10*3/uL (ref 150–400)
RBC: 4.74 MIL/uL (ref 3.87–5.11)
RDW: 14.1 % (ref 11.5–15.5)
WBC: 10.9 10*3/uL — ABNORMAL HIGH (ref 4.0–10.5)

## 2013-06-16 LAB — SURGICAL PCR SCREEN
MRSA, PCR: NEGATIVE
Staphylococcus aureus: NEGATIVE

## 2013-06-16 LAB — BASIC METABOLIC PANEL
BUN: 16 mg/dL (ref 6–23)
CO2: 24 mEq/L (ref 19–32)
Calcium: 9.8 mg/dL (ref 8.4–10.5)
Chloride: 104 mEq/L (ref 96–112)
Creatinine, Ser: 1.06 mg/dL (ref 0.50–1.10)
GFR calc Af Amer: 58 mL/min — ABNORMAL LOW (ref >90)
GFR calc non Af Amer: 50 mL/min — ABNORMAL LOW (ref >90)
Glucose, Bld: 106 mg/dL — ABNORMAL HIGH (ref 70–99)
Potassium: 4.5 mEq/L (ref 3.5–5.1)
Sodium: 138 mEq/L (ref 135–145)

## 2013-06-16 LAB — ABO/RH: ABO/RH(D): A POS

## 2013-06-16 LAB — TYPE AND SCREEN
ABO/RH(D): A POS
Antibody Screen: NEGATIVE

## 2013-06-16 LAB — PROTIME-INR
INR: 0.95 (ref 0.00–1.49)
Prothrombin Time: 12.5 seconds (ref 11.6–15.2)

## 2013-06-16 LAB — APTT: aPTT: 28 seconds (ref 24–37)

## 2013-06-16 NOTE — Pre-Procedure Instructions (Signed)
KARNA ABED  06/16/2013   Your procedure is scheduled on:   Monday  06/20/13  Report to [facility] at 1020 AM.  Call this number if you have problems the morning of surgery: [phone]   Remember:   Do not eat food or drink liquids after midnight.   Take these medicines the morning of surgery with A SIP OF WATER: ALBUTEROL INHALER, AMLODIPINE(NORVASC), PROZAC, PRILOSEC    Do not wear jewelry, make-up or nail polish.  Do not wear lotions, powders, or perfumes. You may wear deodorant.  Do not shave 48 hours prior to surgery. Men may shave face and neck.  Do not bring valuables to the hospital.  [facility] is not responsible                   for any belongings or valuables.  Contacts, dentures or bridgework may not be worn into surgery.  Leave suitcase in the car. After surgery it may be brought to your room.  For patients admitted to the hospital, checkout time is 11:00 AM the day of  discharge.   Patients discharged the day of surgery will not be allowed to drive  home.  Name and phone number of your driver:   Special Instructions: Shower using CHG 2 nights before surgery and the night before surgery.  If you shower the day of surgery use CHG.  Use special wash - you have one bottle of CHG for all showers.  You should use approximately 1/3 of the bottle for each shower.   Please read over the following fact sheets that you were given: Pain Booklet, Coughing and Deep Breathing, Blood Transfusion Information, Total Joint Packet, MRSA Information and Surgical Site Infection Prevention

## 2013-06-19 MED ORDER — VANCOMYCIN HCL IN DEXTROSE 1-5 GM/200ML-% IV SOLN
1000.0000 mg | INTRAVENOUS | Status: AC
  Administered 2013-06-20: 1000 mg via INTRAVENOUS
  Filled 2013-06-19: qty 200

## 2013-06-20 ENCOUNTER — Inpatient Hospital Stay (HOSPITAL_COMMUNITY)
Admission: RE | Admit: 2013-06-20 | Discharge: 2013-06-23 | DRG: 470 | Disposition: A | Discharge status: Home Health | Payer: Medicare Other | Source: Ambulatory Visit | Attending: Orthopedic Surgery | Admitting: Orthopedic Surgery

## 2013-06-20 ENCOUNTER — Encounter (HOSPITAL_COMMUNITY)
Admission: RE | Disposition: A | Discharge status: Home Health | Payer: Self-pay | Source: Ambulatory Visit | Attending: Orthopedic Surgery

## 2013-06-20 ENCOUNTER — Inpatient Hospital Stay (HOSPITAL_COMMUNITY): Payer: Medicare Other

## 2013-06-20 ENCOUNTER — Inpatient Hospital Stay (HOSPITAL_COMMUNITY): Payer: Medicare Other | Admitting: Anesthesiology

## 2013-06-20 ENCOUNTER — Encounter (HOSPITAL_COMMUNITY): Payer: Self-pay | Admitting: Anesthesiology

## 2013-06-20 ENCOUNTER — Encounter (HOSPITAL_COMMUNITY): Payer: Self-pay | Admitting: *Deleted

## 2013-06-20 DIAGNOSIS — J45909 Unspecified asthma, uncomplicated: Secondary | ICD-10-CM | POA: Diagnosis present

## 2013-06-20 DIAGNOSIS — F411 Generalized anxiety disorder: Secondary | ICD-10-CM | POA: Diagnosis present

## 2013-06-20 DIAGNOSIS — F3289 Other specified depressive episodes: Secondary | ICD-10-CM | POA: Diagnosis present

## 2013-06-20 DIAGNOSIS — R110 Nausea: Secondary | ICD-10-CM | POA: Diagnosis not present

## 2013-06-20 DIAGNOSIS — M1712 Unilateral primary osteoarthritis, left knee: Secondary | ICD-10-CM

## 2013-06-20 DIAGNOSIS — G9050 Complex regional pain syndrome I, unspecified: Secondary | ICD-10-CM | POA: Diagnosis present

## 2013-06-20 DIAGNOSIS — Z90710 Acquired absence of both cervix and uterus: Secondary | ICD-10-CM

## 2013-06-20 DIAGNOSIS — I10 Essential (primary) hypertension: Secondary | ICD-10-CM | POA: Diagnosis present

## 2013-06-20 DIAGNOSIS — M1710 Unilateral primary osteoarthritis, unspecified knee: Principal | ICD-10-CM | POA: Diagnosis present

## 2013-06-20 DIAGNOSIS — Z9089 Acquired absence of other organs: Secondary | ICD-10-CM

## 2013-06-20 DIAGNOSIS — M810 Age-related osteoporosis without current pathological fracture: Secondary | ICD-10-CM | POA: Diagnosis present

## 2013-06-20 DIAGNOSIS — Z79899 Other long term (current) drug therapy: Secondary | ICD-10-CM

## 2013-06-20 DIAGNOSIS — Z7901 Long term (current) use of anticoagulants: Secondary | ICD-10-CM

## 2013-06-20 DIAGNOSIS — K219 Gastro-esophageal reflux disease without esophagitis: Secondary | ICD-10-CM | POA: Diagnosis present

## 2013-06-20 DIAGNOSIS — F329 Major depressive disorder, single episode, unspecified: Secondary | ICD-10-CM | POA: Diagnosis present

## 2013-06-20 DIAGNOSIS — Z888 Allergy status to other drugs, medicaments and biological substances status: Secondary | ICD-10-CM

## 2013-06-20 DIAGNOSIS — Z7982 Long term (current) use of aspirin: Secondary | ICD-10-CM

## 2013-06-20 DIAGNOSIS — E785 Hyperlipidemia, unspecified: Secondary | ICD-10-CM | POA: Diagnosis present

## 2013-06-20 DIAGNOSIS — Z880 Allergy status to penicillin: Secondary | ICD-10-CM

## 2013-06-20 DIAGNOSIS — Z87891 Personal history of nicotine dependence: Secondary | ICD-10-CM

## 2013-06-20 HISTORY — PX: TOTAL KNEE ARTHROPLASTY: SHX125

## 2013-06-20 SURGERY — ARTHROPLASTY, KNEE, TOTAL
Anesthesia: Monitor Anesthesia Care | Site: Knee | Laterality: Left | Wound class: Clean

## 2013-06-20 MED ORDER — DEXTROSE 5 % IV SOLN
INTRAVENOUS | Status: DC | PRN
  Administered 2013-06-20: 14:00:00 via INTRAVENOUS

## 2013-06-20 MED ORDER — METOCLOPRAMIDE HCL 5 MG/ML IJ SOLN
5.0000 mg | Freq: Three times a day (TID) | INTRAMUSCULAR | Status: DC | PRN
  Administered 2013-06-22: 10 mg via INTRAVENOUS
  Filled 2013-06-20: qty 2

## 2013-06-20 MED ORDER — OXYCODONE HCL 5 MG PO TABS
5.0000 mg | ORAL_TABLET | ORAL | Status: DC | PRN
  Administered 2013-06-20: 5 mg via ORAL
  Administered 2013-06-20 – 2013-06-22 (×6): 10 mg via ORAL
  Filled 2013-06-20: qty 2
  Filled 2013-06-20: qty 1
  Filled 2013-06-20 (×5): qty 2

## 2013-06-20 MED ORDER — WARFARIN - PHARMACIST DOSING INPATIENT: Freq: Every day | Status: DC

## 2013-06-20 MED ORDER — TIOTROPIUM BROMIDE MONOHYDRATE 18 MCG IN CAPS
18.0000 ug | ORAL_CAPSULE | Freq: Every day | RESPIRATORY_TRACT | Status: DC
  Administered 2013-06-21 – 2013-06-23 (×3): 18 ug via RESPIRATORY_TRACT
  Filled 2013-06-20: qty 5

## 2013-06-20 MED ORDER — SIMVASTATIN 20 MG PO TABS
20.0000 mg | ORAL_TABLET | Freq: Every day | ORAL | Status: DC
  Administered 2013-06-21 – 2013-06-22 (×2): 20 mg via ORAL
  Filled 2013-06-20 (×3): qty 1

## 2013-06-20 MED ORDER — METHOCARBAMOL 100 MG/ML IJ SOLN
500.0000 mg | Freq: Four times a day (QID) | INTRAVENOUS | Status: DC | PRN
  Filled 2013-06-20: qty 5

## 2013-06-20 MED ORDER — HYDROMORPHONE HCL PF 1 MG/ML IJ SOLN
1.0000 mg | INTRAMUSCULAR | Status: DC | PRN
  Administered 2013-06-20 – 2013-06-21 (×3): 1 mg via INTRAVENOUS
  Filled 2013-06-20 (×3): qty 1

## 2013-06-20 MED ORDER — PHENOL 1.4 % MT LIQD: 1.0000 | OROMUCOSAL | Status: DC | PRN

## 2013-06-20 MED ORDER — FERROUS SULFATE 325 (65 FE) MG PO TABS
325.0000 mg | ORAL_TABLET | Freq: Three times a day (TID) | ORAL | Status: DC
  Administered 2013-06-20 – 2013-06-23 (×8): 325 mg via ORAL
  Filled 2013-06-20 (×11): qty 1

## 2013-06-20 MED ORDER — PROPOFOL 10 MG/ML IV BOLUS
INTRAVENOUS | Status: DC | PRN
  Administered 2013-06-20 (×2): 20 mg via INTRAVENOUS

## 2013-06-20 MED ORDER — METOCLOPRAMIDE HCL 10 MG PO TABS: 5.0000 mg | ORAL_TABLET | Freq: Three times a day (TID) | ORAL | Status: DC | PRN

## 2013-06-20 MED ORDER — FENTANYL CITRATE 0.05 MG/ML IJ SOLN
50.0000 ug | INTRAMUSCULAR | Status: DC | PRN
  Administered 2013-06-20: 100 ug via INTRAVENOUS

## 2013-06-20 MED ORDER — POTASSIUM CHLORIDE IN NACL 20-0.9 MEQ/L-% IV SOLN
INTRAVENOUS | Status: DC
  Administered 2013-06-20: 20:00:00 via INTRAVENOUS
  Administered 2013-06-21: 50 mL/h via INTRAVENOUS
  Administered 2013-06-22: 19:00:00 via INTRAVENOUS
  Filled 2013-06-20 (×4): qty 1000

## 2013-06-20 MED ORDER — MENTHOL 3 MG MT LOZG: 1.0000 | LOZENGE | OROMUCOSAL | Status: DC | PRN

## 2013-06-20 MED ORDER — VANCOMYCIN HCL IN DEXTROSE 1-5 GM/200ML-% IV SOLN
1000.0000 mg | Freq: Two times a day (BID) | INTRAVENOUS | Status: AC
  Administered 2013-06-21: 1000 mg via INTRAVENOUS
  Filled 2013-06-20: qty 200

## 2013-06-20 MED ORDER — FLUTICASONE PROPIONATE 50 MCG/ACT NA SUSP
2.0000 | Freq: Every day | NASAL | Status: DC | PRN
  Filled 2013-06-20: qty 16

## 2013-06-20 MED ORDER — HYDROMORPHONE HCL PF 1 MG/ML IJ SOLN
INTRAMUSCULAR | Status: AC
  Filled 2013-06-20: qty 1

## 2013-06-20 MED ORDER — HYDROMORPHONE HCL PF 1 MG/ML IJ SOLN
0.2500 mg | INTRAMUSCULAR | Status: DC | PRN
  Administered 2013-06-20 (×2): 0.5 mg via INTRAVENOUS

## 2013-06-20 MED ORDER — LOSARTAN POTASSIUM 50 MG PO TABS
100.0000 mg | ORAL_TABLET | Freq: Every evening | ORAL | Status: DC
  Administered 2013-06-20 – 2013-06-22 (×3): 100 mg via ORAL
  Filled 2013-06-20 (×4): qty 2

## 2013-06-20 MED ORDER — WARFARIN VIDEO
Freq: Once | Status: AC
  Administered 2013-06-22: 19:00:00

## 2013-06-20 MED ORDER — LACTATED RINGERS IV SOLN
INTRAVENOUS | Status: DC | PRN
  Administered 2013-06-20: 13:00:00 via INTRAVENOUS

## 2013-06-20 MED ORDER — OXYCODONE HCL 5 MG PO TABS: 5.0000 mg | ORAL_TABLET | Freq: Once | ORAL | Status: DC | PRN

## 2013-06-20 MED ORDER — WARFARIN SODIUM 5 MG PO TABS
5.0000 mg | ORAL_TABLET | Freq: Once | ORAL | Status: AC
  Administered 2013-06-20: 5 mg via ORAL
  Filled 2013-06-20: qty 1

## 2013-06-20 MED ORDER — MIDAZOLAM HCL 2 MG/2ML IJ SOLN
INTRAMUSCULAR | Status: AC
  Administered 2013-06-20: 2 mg via INTRAVENOUS
  Filled 2013-06-20: qty 2

## 2013-06-20 MED ORDER — ALENDRONATE SODIUM 70 MG PO TABS: 70.0000 mg | ORAL_TABLET | ORAL | Status: DC

## 2013-06-20 MED ORDER — CHLORHEXIDINE GLUCONATE 4 % EX LIQD: 60.0000 mL | Freq: Once | CUTANEOUS | Status: DC

## 2013-06-20 MED ORDER — SODIUM CHLORIDE 0.9 % IR SOLN
Status: DC | PRN
  Administered 2013-06-20: 3000 mL
  Administered 2013-06-20: 1000 mL

## 2013-06-20 MED ORDER — FENTANYL CITRATE 0.05 MG/ML IJ SOLN
INTRAMUSCULAR | Status: DC | PRN
  Administered 2013-06-20 (×2): 50 ug via INTRAVENOUS

## 2013-06-20 MED ORDER — LACTATED RINGERS IV SOLN
INTRAVENOUS | Status: DC
  Administered 2013-06-20: 12:00:00 via INTRAVENOUS

## 2013-06-20 MED ORDER — FENTANYL CITRATE 0.05 MG/ML IJ SOLN
INTRAMUSCULAR | Status: AC
  Administered 2013-06-20: 100 ug via INTRAVENOUS
  Filled 2013-06-20: qty 2

## 2013-06-20 MED ORDER — ONDANSETRON HCL 4 MG/2ML IJ SOLN
4.0000 mg | Freq: Four times a day (QID) | INTRAMUSCULAR | Status: DC | PRN
  Administered 2013-06-22 (×2): 4 mg via INTRAVENOUS
  Filled 2013-06-20 (×2): qty 2

## 2013-06-20 MED ORDER — MIDAZOLAM HCL 2 MG/2ML IJ SOLN
1.0000 mg | INTRAMUSCULAR | Status: DC | PRN
  Administered 2013-06-20: 2 mg via INTRAVENOUS

## 2013-06-20 MED ORDER — ADULT MULTIVITAMIN W/MINERALS CH
1.0000 | ORAL_TABLET | Freq: Every day | ORAL | Status: DC
  Administered 2013-06-20 – 2013-06-23 (×4): 1 via ORAL
  Filled 2013-06-20 (×4): qty 1

## 2013-06-20 MED ORDER — PANTOPRAZOLE SODIUM 40 MG PO TBEC
40.0000 mg | DELAYED_RELEASE_TABLET | Freq: Every day | ORAL | Status: DC
  Administered 2013-06-20 – 2013-06-23 (×4): 40 mg via ORAL
  Filled 2013-06-20 (×3): qty 1

## 2013-06-20 MED ORDER — HYDROMORPHONE HCL PF 1 MG/ML IJ SOLN
INTRAMUSCULAR | Status: DC | PRN
  Administered 2013-06-20 (×4): .25 mg via INTRAVENOUS

## 2013-06-20 MED ORDER — VITAMIN D3 25 MCG (1000 UNIT) PO TABS
1000.0000 [IU] | ORAL_TABLET | Freq: Two times a day (BID) | ORAL | Status: DC
  Administered 2013-06-20 – 2013-06-23 (×6): 1000 [IU] via ORAL
  Filled 2013-06-20 (×7): qty 1

## 2013-06-20 MED ORDER — ONDANSETRON HCL 4 MG PO TABS: 4.0000 mg | ORAL_TABLET | Freq: Four times a day (QID) | ORAL | Status: DC | PRN

## 2013-06-20 MED ORDER — BISACODYL 10 MG RE SUPP: 10.0000 mg | Freq: Every day | RECTAL | Status: DC | PRN

## 2013-06-20 MED ORDER — ONDANSETRON HCL 4 MG/2ML IJ SOLN: 4.0000 mg | Freq: Once | INTRAMUSCULAR | Status: DC | PRN

## 2013-06-20 MED ORDER — MOMETASONE FURO-FORMOTEROL FUM 100-5 MCG/ACT IN AERO
2.0000 | INHALATION_SPRAY | Freq: Two times a day (BID) | RESPIRATORY_TRACT | Status: DC
  Administered 2013-06-20 – 2013-06-23 (×6): 2 via RESPIRATORY_TRACT
  Filled 2013-06-20: qty 8.8

## 2013-06-20 MED ORDER — OXYCODONE HCL 5 MG/5ML PO SOLN: 5.0000 mg | Freq: Once | ORAL | Status: DC | PRN

## 2013-06-20 MED ORDER — ACETAMINOPHEN 325 MG PO TABS
650.0000 mg | ORAL_TABLET | Freq: Four times a day (QID) | ORAL | Status: DC | PRN
  Administered 2013-06-21: 650 mg via ORAL
  Filled 2013-06-20: qty 2

## 2013-06-20 MED ORDER — ASPIRIN 325 MG PO TABS
325.0000 mg | ORAL_TABLET | Freq: Every day | ORAL | Status: DC | PRN
  Filled 2013-06-20: qty 1

## 2013-06-20 MED ORDER — MEPERIDINE HCL 25 MG/ML IJ SOLN: 6.2500 mg | INTRAMUSCULAR | Status: DC | PRN

## 2013-06-20 MED ORDER — ACETAMINOPHEN 650 MG RE SUPP: 650.0000 mg | Freq: Four times a day (QID) | RECTAL | Status: DC | PRN

## 2013-06-20 MED ORDER — METHOCARBAMOL 500 MG PO TABS
500.0000 mg | ORAL_TABLET | Freq: Four times a day (QID) | ORAL | Status: DC | PRN
  Administered 2013-06-20 – 2013-06-21 (×4): 500 mg via ORAL
  Filled 2013-06-20 (×4): qty 1

## 2013-06-20 MED ORDER — FLUOXETINE HCL 20 MG PO CAPS
40.0000 mg | ORAL_CAPSULE | Freq: Every day | ORAL | Status: DC
  Administered 2013-06-21 – 2013-06-23 (×3): 40 mg via ORAL
  Filled 2013-06-20 (×4): qty 2

## 2013-06-20 MED ORDER — COUMADIN BOOK
Freq: Once | Status: DC
  Filled 2013-06-20: qty 1

## 2013-06-20 MED ORDER — PROPOFOL INFUSION 10 MG/ML OPTIME
INTRAVENOUS | Status: DC | PRN
  Administered 2013-06-20: 15 ug/kg/min via INTRAVENOUS

## 2013-06-20 SURGICAL SUPPLY — 59 items
BANDAGE ELASTIC 4 VELCRO ST LF (GAUZE/BANDAGES/DRESSINGS) ×2 IMPLANT
BANDAGE ESMARK 6X9 LF (GAUZE/BANDAGES/DRESSINGS) ×1 IMPLANT
BANDAGE GAUZE ELAST BULKY 4 IN (GAUZE/BANDAGES/DRESSINGS) ×4 IMPLANT
BLADE SAG 18X100X1.27 (BLADE) ×2 IMPLANT
BLADE SAW SGTL 13.0X1.19X90.0M (BLADE) ×2 IMPLANT
BNDG CMPR 9X6 STRL LF SNTH (GAUZE/BANDAGES/DRESSINGS) ×1
BNDG CMPR MED 10X6 ELC LF (GAUZE/BANDAGES/DRESSINGS) ×1
BNDG ELASTIC 6X10 VLCR STRL LF (GAUZE/BANDAGES/DRESSINGS) ×2 IMPLANT
BNDG ESMARK 6X9 LF (GAUZE/BANDAGES/DRESSINGS) ×2
BOWL SMART MIX CTS (DISPOSABLE) ×2 IMPLANT
CAPT RP KNEE ×1 IMPLANT
CEMENT HV SMART SET (Cement) ×4 IMPLANT
CLOTH BEACON ORANGE TIMEOUT ST (SAFETY) ×2 IMPLANT
COVER SURGICAL LIGHT HANDLE (MISCELLANEOUS) ×2 IMPLANT
CUFF TOURNIQUET SINGLE 34IN LL (TOURNIQUET CUFF) ×1 IMPLANT
CUFF TOURNIQUET SINGLE 44IN (TOURNIQUET CUFF) IMPLANT
DRAPE EXTREMITY T 121X128X90 (DRAPE) ×2 IMPLANT
DRAPE PROXIMA HALF (DRAPES) ×2 IMPLANT
DRAPE U-SHAPE 47X51 STRL (DRAPES) ×2 IMPLANT
DRILL BIT 5/64 (BIT) ×1 IMPLANT
DRSG ADAPTIC 3X8 NADH LF (GAUZE/BANDAGES/DRESSINGS) ×2 IMPLANT
DRSG PAD ABDOMINAL 8X10 ST (GAUZE/BANDAGES/DRESSINGS) ×4 IMPLANT
DURAPREP 26ML APPLICATOR (WOUND CARE) ×2 IMPLANT
ELECT CAUTERY BLADE 6.4 (BLADE) ×2 IMPLANT
ELECT REM PT RETURN 9FT ADLT (ELECTROSURGICAL) ×2
ELECTRODE REM PT RTRN 9FT ADLT (ELECTROSURGICAL) ×1 IMPLANT
FACESHIELD LNG OPTICON STERILE (SAFETY) ×5 IMPLANT
GLOVE BIOGEL PI ORTHO PRO 7.5 (GLOVE) ×1
GLOVE BIOGEL PI ORTHO PRO SZ8 (GLOVE) ×1
GLOVE ORTHO TXT STRL SZ7.5 (GLOVE) ×2 IMPLANT
GLOVE PI ORTHO PRO STRL 7.5 (GLOVE) ×1 IMPLANT
GLOVE PI ORTHO PRO STRL SZ8 (GLOVE) ×1 IMPLANT
GLOVE SURG ORTHO 8.5 STRL (GLOVE) ×2 IMPLANT
GOWN STRL NON-REIN LRG LVL3 (GOWN DISPOSABLE) ×3 IMPLANT
GOWN STRL REIN XL XLG (GOWN DISPOSABLE) ×4 IMPLANT
HANDPIECE INTERPULSE COAX TIP (DISPOSABLE) ×2
IMMOBILIZER KNEE 22 UNIV (SOFTGOODS) IMPLANT
KIT BASIN OR (CUSTOM PROCEDURE TRAY) ×2 IMPLANT
KIT MANIFOLD (MISCELLANEOUS) ×2 IMPLANT
KIT ROOM TURNOVER OR (KITS) ×2 IMPLANT
MANIFOLD NEPTUNE II (INSTRUMENTS) ×2 IMPLANT
NS IRRIG 1000ML POUR BTL (IV SOLUTION) ×2 IMPLANT
PACK TOTAL JOINT (CUSTOM PROCEDURE TRAY) ×2 IMPLANT
PAD ARMBOARD 7.5X6 YLW CONV (MISCELLANEOUS) ×4 IMPLANT
SET HNDPC FAN SPRY TIP SCT (DISPOSABLE) ×1 IMPLANT
SPONGE GAUZE 4X4 12PLY (GAUZE/BANDAGES/DRESSINGS) ×2 IMPLANT
STRIP CLOSURE SKIN 1/2X4 (GAUZE/BANDAGES/DRESSINGS) ×4 IMPLANT
SUCTION FRAZIER TIP 10 FR DISP (SUCTIONS) ×2 IMPLANT
SUT MNCRL AB 3-0 PS2 18 (SUTURE) ×2 IMPLANT
SUT VIC AB 0 CT1 27 (SUTURE) ×4
SUT VIC AB 0 CT1 27XBRD ANBCTR (SUTURE) ×2 IMPLANT
SUT VIC AB 1 CT1 27 (SUTURE) ×4
SUT VIC AB 1 CT1 27XBRD ANBCTR (SUTURE) ×2 IMPLANT
SUT VIC AB 2-0 CT1 27 (SUTURE) ×4
SUT VIC AB 2-0 CT1 TAPERPNT 27 (SUTURE) ×2 IMPLANT
TOWEL OR 17X24 6PK STRL BLUE (TOWEL DISPOSABLE) ×2 IMPLANT
TOWEL OR 17X26 10 PK STRL BLUE (TOWEL DISPOSABLE) ×2 IMPLANT
TRAY FOLEY CATH 16FRSI W/METER (SET/KITS/TRAYS/PACK) ×1 IMPLANT
WATER STERILE IRR 1000ML POUR (IV SOLUTION) ×4 IMPLANT

## 2013-06-20 NOTE — Progress Notes (Signed)
ANTICOAGULATION CONSULT NOTE - Initial Consult  Pharmacy Consult for Coumadin Indication: VTE prophylaxis  Allergies  Allergen Reactions  . Penicillins Hives  . Prednisone Other (See Comments)    Severe Headache and Flushing  . Levofloxacin Rash    Patient Measurements:   Heparin Dosing Weight:    Vital Signs: Temp: 97.6 F (36.4 C) (09/15 1743) Temp src: Oral (09/15 1056) BP: 182/79 mmHg (09/15 1743) Pulse Rate: 84 (09/15 1743)  Labs: No results found for this basename: HGB, HCT, PLT, APTT, LABPROT, INR, HEPARINUNFRC, CREATININE, CKTOTAL, CKMB, TROPONINI,  in the last 72 hours  The CrCl is unknown because both a height and weight (above a minimum accepted value) are required for this calculation.   Medical History: Past Medical History  Diagnosis Date  . Allergy     seasonal  . Depression   . Anxiety   . Arthritis   . Asthma     bronchitis  . GERD (gastroesophageal reflux disease)   . Hyperlipidemia   . Hypertension   . Osteoporosis   . Ulcer     H-pylorie treated  . RSD (reflex sympathetic dystrophy)   . COPD (chronic obstructive pulmonary disease)   . Pneumonia     2013  . Blood clot in vein     LEG    YEARS AGO  . Blood dyscrasia     ELEVATED WBC  HX    Medications:  Prescriptions prior to admission  Medication Sig Dispense Refill  . alendronate (FOSAMAX) 70 MG tablet Take 70 mg by mouth every Monday. Take with a full glass of water on an empty stomach.      Marland Kitchen aspirin 325 MG tablet Take 325 mg by mouth daily as needed for pain.      . cholecalciferol (VITAMIN D) 1000 UNITS tablet Take 1,000 Units by mouth 2 (two) times daily.       Marland Kitchen FLUoxetine (PROZAC) 40 MG capsule Take 40 mg by mouth daily before breakfast.       . fluticasone (FLONASE) 50 MCG/ACT nasal spray Place 2 sprays into the nose daily as needed for allergies.       . Fluticasone-Salmeterol (ADVAIR) 250-50 MCG/DOSE AEPB Inhale 1 puff into the lungs every 12 (twelve) hours.      Marland Kitchen  losartan (COZAAR) 100 MG tablet Take 100 mg by mouth every evening.       . lovastatin (MEVACOR) 40 MG tablet Take 40 mg by mouth every evening.      . Multiple Vitamin (MULTIVITAMIN WITH MINERALS) TABS Take 1 tablet by mouth daily.      Marland Kitchen omeprazole (PRILOSEC) 40 MG capsule Take 40 mg by mouth daily.       Marland Kitchen tiotropium (SPIRIVA) 18 MCG inhalation capsule Place 18 mcg into inhaler and inhale daily.        Assessment: 77 y/o F s/p L TKA on 9/15. Plan to start Coumadin for VTE prophylaxis. Baseline INR 0.95. Baseline CBC WNL.  Goal of Therapy:  INR 2-3 Monitor platelets by anticoagulation protocol: Yes   Plan:  Coumadin 5mg  po x 1 tonight Coumadin book/video to initiate education Daily PT/INR   Tobey Schmelzle S. Merilynn Finland, PharmD, BCPS Clinical Staff Pharmacist Pager [phone]  Misty Stanley Stillinger 06/20/2013,6:12 PM

## 2013-06-20 NOTE — Progress Notes (Addendum)
Patient complaining of "sinus infection" worried about having surgery with nasal drainage and slight cough. Notified dr. Noreene Larsson to assess patient prior to getting patient ready. No fever on arrival

## 2013-06-20 NOTE — Anesthesia Postprocedure Evaluation (Signed)
  Anesthesia Post-op Note  Patient: Deanna Acosta  Procedure(s) Performed: Procedure(s): LEFT TOTAL KNEE ARTHROPLASTY (Left)  Patient Location: PACU  Anesthesia Type:MAC and Spinal  Level of Consciousness: awake  Airway and Oxygen Therapy: Patient Spontanous Breathing  Post-op Pain: none  Post-op Assessment: Post-op Vital signs reviewed, Patient's Cardiovascular Status Stable, Respiratory Function Stable, Patent Airway, No signs of Nausea or vomiting and Pain level controlled  Post-op Vital Signs: stable  Complications: No apparent anesthesia complications

## 2013-06-20 NOTE — Brief Op Note (Signed)
06/20/2013  3:32 PM  PATIENT:  Deanna Acosta  77 y.o. female  PRE-OPERATIVE DIAGNOSIS:  Left Knee Osteoarthritis, end staged  POST-OPERATIVE DIAGNOSIS:  Left Knee Osteoarthritisend staged  PROCEDURE:  Procedure(s): LEFT TOTAL KNEE ARTHROPLASTY (Left) , DePuy Sigma RP  SURGEON:  Surgeon(s) and Role:    * Verlee Rossetti, MD - Primary  PHYSICIAN ASSISTANT:   ASSISTANTS: April Green RNFA   ANESTHESIA:  Spinal  EBL:  Total I/O In: 200 [I.V.:200] Out: -   BLOOD ADMINISTERED:none  DRAINS: none   LOCAL MEDICATIONS USED:  NONE  SPECIMEN:  No Specimen  DISPOSITION OF SPECIMEN:  N/A  COUNTS:  YES  TOURNIQUET:  * Missing tourniquet times found for documented tourniquets in log:  [id number] *  DICTATION: .Other Dictation: Dictation Number [id number]  PLAN OF CARE: Admit to inpatient   PATIENT DISPOSITION:  PACU - hemodynamically stable.   Delay start of Pharmacological VTE agent (>24hrs) due to surgical blood loss or risk of bleeding: no

## 2013-06-20 NOTE — Progress Notes (Signed)
Orthopedic Tech Progress Note Patient Details:  ABBEE CREMEENS [birth year] [id number]  CPM Left Knee CPM Left Knee: On Left Knee Flexion (Degrees): 60 Left Knee Extension (Degrees): 0 Additional Comments: applied overhead frame to bed   [name] 06/20/2013, 6:45 PM

## 2013-06-20 NOTE — Progress Notes (Signed)
PHARMACIST - PHYSICIAN COMMUNICATION  CONCERNING: P&T Medication Policy Regarding Oral Bisphosphonates  RECOMMENDATION: Your order for alendronate (Fosamax) has been discontinued at this time.  If the patient's post-hospital medical condition warrants safe use of this class of drugs, please resume the pre-hospital regimen upon discharge.  DESCRIPTION:  Alendronate (Fosamax), ibandronate (Boniva), and risedronate (Actonel) can cause severe esophageal erosions in patients who are unable to remain upright at least 30 minutes after taking this medication.   Since brief interruptions in therapy are thought to have minimal impact on bone mineral density, the Pharmacy & Therapeutics Committee has established that bisphosphonate orders should be routinely discontinued during hospitalization.   To override this safety policy and permit administration of Boniva, Fosamax, or Actonel in the hospital, prescribers must write "DO NOT HOLD" in the comments section when placing the order for this class of medications.  [place], [address].D., BCPS Clinical Pharmacist Pager: [phone] 06/20/2013 6:40 PM

## 2013-06-20 NOTE — Anesthesia Procedure Notes (Addendum)
Anesthesia Regional Block:  Adductor canal block  Pre-Anesthetic Checklist: ,, timeout performed, Correct Patient, Correct Site, Correct Laterality, Correct Procedure, Correct Position, site marked, Risks and benefits discussed,  Surgical consent,  Pre-op evaluation,  At surgeon's request and post-op pain management  Laterality: Left  Prep: chloraprep       Needles:  Injection technique: Single-shot  Needle Type: Echogenic Stimulator Needle      Needle Gauge: 22 and 22 G    Additional Needles:  Procedures: ultrasound guided (picture in chart) Adductor canal block Narrative:  Start time: 06/20/2013 12:10 PM End time: 06/20/2013 12:15 PM Injection made incrementally with aspirations every 5 mL.  Performed by: Personally   Additional Notes: 20 cc 0.5% marcaine with 1:200 Epi  Kipp Brood, MD   Spinal  Start time: 06/20/2013 1:40 PM End time: 06/20/2013 1:45 PM Staffing Anesthesiologist: Noreene Larsson, Reeanna Acri Performed by: anesthesiologist  Preanesthetic Checklist Completed: patient identified, site marked, IV checked and risks and benefits discussed Spinal Block Patient position: left lateral decubitus Prep: ChloraPrep Patient monitoring: heart rate, cardiac monitor, continuous pulse ox and blood pressure Approach: left paramedian Location: L3-4 Injection technique: single-shot Needle Needle type: Tuohy  Needle gauge: 22 G Needle length: 9 cm Needle insertion depth: 6 cm Assessment Sensory level: T10 Additional Notes 10 mg. 0.75% Bupivacaine injected easily

## 2013-06-20 NOTE — H&P (View-Only) (Signed)
Deanna Acosta is an 77 y.o. female.    Chief Complaint: left knee pain  HPI: Pt is a 77 y.o. female complaining of left knee pain pain for multiple years. Pain had continually increased since the beginning. X-rays in the clinic show end-stage arthritic changes of the left knee. Pt has tried various conservative treatments which have failed to alleviate their symptoms, including shots and therapy. Various options are discussed with the patient. Risks, benefits and expectations were discussed with the patient. Patient understand the risks, benefits and expectations and wishes to proceed with surgery.   PCP:  MILLER,LISA LYNN, MD  D/C Plans:  Home with HHPT  PMH: Past Medical History  Diagnosis Date  . Allergy     seasonal  . Depression   . Anxiety   . Arthritis   . Asthma     bronchitis  . GERD (gastroesophageal reflux disease)   . Hyperlipidemia   . Hypertension   . Osteoporosis   . Ulcer     H-pylorie treated  . RSD (reflex sympathetic dystrophy)     PSH: Past Surgical History  Procedure Laterality Date  . Rotator cuff repair  2009  . Cholecystectomy  2000  . Orif distal radius fracture  2002    plate  . Total abdominal hysterectomy  1980    fibroids  . Knee arthroscopy      right  . Elbow arthroscopy      tendonitis/right  . Tonsillectomy and adenoidectomy    . Ganglion cyst excision      left wrist  . Knee ligament reconstruction      right/water ski accident    Social History:  reports that she has quit smoking. She has never used smokeless tobacco. She reports that she does not drink alcohol or use illicit drugs.  Allergies:  Allergies  Allergen Reactions  . Penicillins Hives  . Prednisone Other (See Comments)    Severe Headache and Flushing  . Levofloxacin Rash    Medications: Current Outpatient Prescriptions  Medication Sig Dispense Refill  . albuterol (PROVENTIL HFA;VENTOLIN HFA) 108 (90 BASE) MCG/ACT inhaler Inhale 2 puffs into the lungs  every 6 (six) hours as needed. Wheezing and shortness of breath  1 Inhaler  0  . alendronate (FOSAMAX) 70 MG tablet Take 70 mg by mouth every 7 (seven) days. Take with a full glass of water on an empty stomach.      . amLODipine (NORVASC) 5 MG tablet Take 5 mg by mouth daily with lunch.      . aspirin 81 MG tablet Take 81 mg by mouth daily.        . azithromycin (ZITHROMAX) 500 MG tablet Take 1 tablet (500 mg total) by mouth daily.  5 tablet  0  . cholecalciferol (VITAMIN D) 1000 UNITS tablet Take 1,000 Units by mouth daily.        . dexamethasone (DECADRON) 4 MG tablet Take 1 tablet daily for 3 days, then half tablet daily for 3 days, then STOP.  6 tablet  0  . FLUoxetine (PROZAC) 40 MG capsule Take 40 mg by mouth daily before breakfast.       . guaiFENesin-dextromethorphan (ROBITUSSIN DM) 100-10 MG/5ML syrup Take 5 mLs by mouth every 4 (four) hours as needed for cough.  118 mL  0  . hydrochlorothiazide (HYDRODIURIL) 12.5 MG tablet Take 12.5 mg by mouth daily before breakfast.       . ibuprofen (ADVIL,MOTRIN) 200 MG tablet Take 400 mg by mouth   every 6 (six) hours as needed. Pain      . losartan (COZAAR) 100 MG tablet Take 100 mg by mouth every evening.       . lovastatin (MEVACOR) 40 MG tablet Take 40 mg by mouth every evening.      . Multiple Vitamin (MULTIVITAMIN WITH MINERALS) TABS Take 1 tablet by mouth daily.      . omeprazole (PRILOSEC) 40 MG capsule Take 40 mg by mouth daily.         Current Facility-Administered Medications  Medication Dose Route Frequency Provider Last Rate Last Dose  . chlorhexidine (HIBICLENS) 4 % liquid 4 application  60 mL Topical Once Finlay Godbee B Tiyonna Sardinha, PA-C      . [START ON 06/15/2013] vancomycin (VANCOCIN) 1,000 mg in sodium chloride 0.9 % 500 mL IVPB  1,000 mg Intravenous On Call to OR Josefine Fuhr B Othello Sgroi, PA-C        No results found for this or any previous visit (from the past 48 hour(s)). No results found.  ROS: Pain with rom of the left lower  extremity  Physical Exam: BP:   160/82  ;  HR:   72  ; Resp:   14  : Alert and oriented 77 y.o. female in no acute distress Cranial nerves 2-12 intact Cervical spine: full rom with no tenderness, nv intact distally Chest: active breath sounds bilaterally, no wheeze rhonchi or rales Heart: regular rate and rhythm, no murmur Abd: non tender non distended with active bowel sounds Hip is stable with rom  Left knee with moderate crepitus  Mild medial and lateral joint line tenderness nv intact distally No rashes or edema Normal heel toe gait  Assessment/Plan Assessment: left knee end stage osteoarthritis  Plan: Patient will undergo a left total knee arthroplasty by Dr. Norris at [place]. Risks benefits and expectations were discussed with the patient. Patient understand risks, benefits and expectations and wishes to proceed.   

## 2013-06-20 NOTE — Anesthesia Preprocedure Evaluation (Signed)
Anesthesia Evaluation  Patient identified by MRN, date of birth, ID band Patient awake    Reviewed: Allergy & Precautions, H&P , NPO status , Patient's Chart, lab work & pertinent test results  Airway       Dental   Pulmonary asthma , pneumonia -, resolved, COPD         Cardiovascular hypertension, Pt. on medications     Neuro/Psych  Headaches, Anxiety Depression  Neuromuscular disease    GI/Hepatic GERD-  Controlled and Medicated,  Endo/Other    Renal/GU Renal InsufficiencyRenal disease     Musculoskeletal   Abdominal   Peds  Hematology  (+) Blood dyscrasia, anemia ,   Anesthesia Other Findings   Reproductive/Obstetrics                           Anesthesia Physical Anesthesia Plan  ASA: III  Anesthesia Plan: Spinal and MAC   Post-op Pain Management: MAC Combined w/ Regional for Post-op pain   Induction:   Airway Management Planned: Simple Face Mask  Additional Equipment:   Intra-op Plan:   Post-operative Plan:   Informed Consent:   Plan Discussed with:   Anesthesia Plan Comments:         Anesthesia Quick Evaluation

## 2013-06-20 NOTE — Transfer of Care (Signed)
Immediate Anesthesia Transfer of Care Note  Patient: Deanna Acosta  Procedure(s) Performed: Procedure(s): LEFT TOTAL KNEE ARTHROPLASTY (Left)  Patient Location: PACU  Anesthesia Type:Spinal  Level of Consciousness: awake, alert , oriented and patient cooperative  Airway & Oxygen Therapy: Patient Spontanous Breathing and Patient connected to nasal cannula oxygen  Post-op Assessment: Report given to PACU RN and Post -op Vital signs reviewed and stable, moving both feet  Post vital signs: Reviewed and stable  Complications: No apparent anesthesia complications

## 2013-06-20 NOTE — Interval H&P Note (Signed)
History and Physical Interval Note:  06/20/2013 12:55 PM  Deanna Acosta  has presented today for surgery, with the diagnosis of Left Knee Osteoarthritis  The various methods of treatment have been discussed with the patient and family. After consideration of risks, benefits and other options for treatment, the patient has consented to  Procedure(s): LEFT TOTAL KNEE ARTHROPLASTY (Left) as a surgical intervention .  The patient's history has been reviewed, patient examined, no change in status, stable for surgery.  I have reviewed the patient's chart and labs.  Questions were answered to the patient's satisfaction.     Ladarrell Cornwall,STEVEN R

## 2013-06-21 ENCOUNTER — Encounter (HOSPITAL_COMMUNITY): Payer: Self-pay | Admitting: Orthopedic Surgery

## 2013-06-21 LAB — CBC
HCT: 37.4 % (ref 36.0–46.0)
Hemoglobin: 12.3 g/dL (ref 12.0–15.0)
MCH: 28.5 pg (ref 26.0–34.0)
MCHC: 32.9 g/dL (ref 30.0–36.0)
MCV: 86.8 fL (ref 78.0–100.0)
Platelets: 287 10*3/uL (ref 150–400)
RBC: 4.31 MIL/uL (ref 3.87–5.11)
RDW: 14.4 % (ref 11.5–15.5)
WBC: 15 10*3/uL — ABNORMAL HIGH (ref 4.0–10.5)

## 2013-06-21 LAB — BASIC METABOLIC PANEL
BUN: 12 mg/dL (ref 6–23)
CO2: 24 mEq/L (ref 19–32)
Calcium: 9 mg/dL (ref 8.4–10.5)
Chloride: 99 mEq/L (ref 96–112)
Creatinine, Ser: 1 mg/dL (ref 0.50–1.10)
GFR calc Af Amer: 62 mL/min — ABNORMAL LOW (ref >90)
GFR calc non Af Amer: 54 mL/min — ABNORMAL LOW (ref >90)
Glucose, Bld: 145 mg/dL — ABNORMAL HIGH (ref 70–99)
Potassium: 4.6 mEq/L (ref 3.5–5.1)
Sodium: 134 mEq/L — ABNORMAL LOW (ref 135–145)

## 2013-06-21 LAB — URINALYSIS, ROUTINE W REFLEX MICROSCOPIC
Glucose, UA: 100 mg/dL — AB
Hgb urine dipstick: NEGATIVE
Ketones, ur: NEGATIVE mg/dL
Leukocytes, UA: NEGATIVE
Nitrite: NEGATIVE
Protein, ur: NEGATIVE mg/dL
Specific Gravity, Urine: 1.023 (ref 1.005–1.030)
Urobilinogen, UA: 1 mg/dL (ref 0.0–1.0)
pH: 5.5 (ref 5.0–8.0)

## 2013-06-21 LAB — PROTIME-INR
INR: 1.07 (ref 0.00–1.49)
Prothrombin Time: 13.7 seconds (ref 11.6–15.2)

## 2013-06-21 MED ORDER — WARFARIN SODIUM 5 MG PO TABS
5.0000 mg | ORAL_TABLET | Freq: Once | ORAL | Status: AC
  Administered 2013-06-21: 5 mg via ORAL
  Filled 2013-06-21: qty 1

## 2013-06-21 MED ORDER — ENOXAPARIN SODIUM 40 MG/0.4ML ~~LOC~~ SOLN
40.0000 mg | SUBCUTANEOUS | Status: DC
  Administered 2013-06-21 – 2013-06-22 (×2): 40 mg via SUBCUTANEOUS
  Filled 2013-06-21 (×3): qty 0.4

## 2013-06-21 NOTE — Progress Notes (Addendum)
Pt has not voided, bladder scan performed of urine retained at 1430.  Second bladder scan performed at 1640 and retained, I&O performed at that time along with U/A and U/A with culture.  Pt has been very sleepy this shift, will awaken on que and is Ax4.  Pt input of meals has been >25%.  Pt also had x3 emesis episodes earlier this am.   BS in all quads, with second RN verification.  Awaiting call back from MD and U/a results.  Pt is resting at this time with family at bedside.  Nsg to continue to monitor for status changes.

## 2013-06-21 NOTE — Progress Notes (Signed)
Orthopedics Progress Note  Subjective: It hurts  Objective:  Filed Vitals:   06/21/13 0524  BP: 181/83  Pulse: 106  Temp: 99.7 F (37.6 C)  Resp: 18    General: Awake and alert  Musculoskeletal: left knee dressing CDI Neurovascularly intact  Lab Results  Component Value Date   WBC 15.0* 06/21/2013   HGB 12.3 06/21/2013   HCT 37.4 06/21/2013   MCV 86.8 06/21/2013   PLT 287 06/21/2013       Component Value Date/Time   NA 134* 06/21/2013 0455   K 4.6 06/21/2013 0455   CL 99 06/21/2013 0455   CO2 24 06/21/2013 0455   GLUCOSE 145* 06/21/2013 0455   BUN 12 06/21/2013 0455   CREATININE 1.00 06/21/2013 0455   CALCIUM 9.0 06/21/2013 0455   GFRNONAA 54* 06/21/2013 0455   GFRAA 62* 06/21/2013 0455    Lab Results  Component Value Date   INR 1.07 06/21/2013   INR 0.95 06/16/2013   INR 0.9 06/29/2009    Assessment/Plan: POD #1 s/p Procedure(s): LEFT TOTAL KNEE ARTHROPLASTY Foley out WBC up, possibly due to stress demargination.  Patient also reported sinus issues yesterday morning before her surgery.  Will watch closely.  I stressed to her the importance of the incentive spirometer.  DVT prophylaxis - coumadin/Lovenox  and mechanical PT, OT CPM 0-60 right now, increase 10 degrees per day as tolerated. D/C planning  - will need home CPM, PT,OT, RN(coumadin monitoring) target d/c Thursday  Viviann Spare R. Ranell Patrick, MD 06/21/2013 7:32 AM

## 2013-06-21 NOTE — Op Note (Signed)
NAMETEOFILA, BOWERY NO.:  [id number]  MEDICAL RECORD NO.:  [id number]  LOCATION:  5N09C                        FACILITY:  [facility]  PHYSICIAN:  Almedia Balls. Ranell Patrick, M.D. DATE OF BIRTH:  Feb 27, 1936  DATE OF PROCEDURE:  06/20/2013 DATE OF DISCHARGE:                              OPERATIVE REPORT   PREOPERATIVE DIAGNOSIS:  Left knee end-stage osteoarthritis.  POSTOPERATIVE DIAGNOSIS:  Left knee end-stage osteoarthritis.  PROCEDURE PERFORMED:  Left total knee replacement using DePuy Sigma rotating platform prosthesis.  ATTENDING SURGEON:  Almedia Balls. Ranell Patrick, M.D.  ASSISTANT:  April green RNFA.  ANESTHESIA:  Spinal anesthesia was used.  ESTIMATED BLOOD LOSS:  Less than 100 mL.  FLUID REPLACEMENT:  1500 mL crystalloid.  INSTRUMENT COUNTS:  Correct.  COMPLICATIONS:  There were no complications.  ANTIBIOTICS:  Perioperative antibiotics were given.  INDICATIONS:  The patient is a 77 year old female with worsening left knee pain secondary to end-stage arthritis.  The patient is unable to walk more than a block secondary to pain having to sit down.  She has night pain, rest pain.  Uses a cane to get around.  She has failed all measures of conservative management including modification activities, injections, anti-inflammatory medications, and presents for operative treatment to restore function, eliminate pain, informed consent obtained.  DESCRIPTION OF PROCEDURE:  After an adequate level of anesthesia was achieved, the patient was positioned supine in the operating room table. Left leg correctly identified.  Time-out called.  Sterile prep and drape of the knee performed.  The leg was exsanguinated and tourniquet elevated to 350 mmHg.  Longitudinal midline incision was created with the knee in flexion.  Dissection was carried sharply down through to the medial parapatellar tissues.  We performed a medial parapatellar arthrotomy with a fresh 10 blade scalpel.   Patella everted.  Lateral patellofemoral ligaments divided.  The distal femur entered using a step- cut drill.  The intramedullary distal femoral resection guide was placed, set on 5 degrees left, 10-mm resection.  We resected distal femur and sized the femur anterior down to a size 3, performed our anterior posterior and chamfer cuts with a formalin block.  Next, we subluxed the tibia anteriorly removing ACL, PCL, and remaining meniscal tissues.  We then performed a 90-degree tibial cut, 2 mm off the affected medial side.  Next, we checked our gaps which were symmetric at 10 mm in flexion and extension.  We removed osteophytes off the posterior femur and posterior capsule around the posterior portion of the notch.  Next, we completed our tibial preparation with modular drilling keel punch for the DePuy RP Sigma prosthesis.  We then went ahead and completed our femoral preparation with the box cut using the notch cut or box cut guide with the oscillating saw.  Next, we placed our trial components, a 10 poly insert and reduced our knee.  We were pleased with range of motion.  We felt like can probably get a 12.5 in there.  We then went ahead and resurfaced our patella starting it from a 24-mm thickness and resecting down to 16 mm.  So, we drilled the lugs for 35 patellar button and reduced the knee, happy with soft tissue balance  and patellar tracking with no-touch technique.  We removed all trial components and cemented the real components, size 4 tibia, size 3 left femur and placed a 10 trial poly insert and placed the knee in extension.  While the cement hardened, we also cemented the patellar button size 35 in place and placed the patellar clamp until the cement was hardened, removed excess cement with quarter-inch curved osteotome. We trialed again with a 12.5 poly and definitely felt better about the flexion stability.  We then removed the trial components, thoroughly irrigated the  knee and inspected 1 final time for cement, and then went ahead and placed our real 12.5 poly insert.  We reduced the knee.  We were pleased with our balancing and full extension, and alignment.  We then repaired the medial and parapatellar arthrotomy with interrupted #1 Vicryl suture followed by 2-0 Vicryl subcutaneous closure and 4-0 Monocryl for skin.  Steri-Strips applied followed by sterile dressing. The patient tolerated the procedure well.     Almedia Balls. Ranell Patrick, M.D.     SRN/MEDQ  D:  06/20/2013  T:  06/21/2013  Job:  [id number]

## 2013-06-21 NOTE — Progress Notes (Signed)
Patient set up with [facility] for RN, PT, OT TNT providing CPM

## 2013-06-21 NOTE — Progress Notes (Signed)
ANTICOAGULATION CONSULT NOTE - Follow up  Pharmacy Consult for Coumadin Indication: VTE prophylaxis  Allergies  Allergen Reactions  . Penicillins Hives  . Prednisone Other (See Comments)    Severe Headache and Flushing  . Levofloxacin Rash    Patient Measurements: Height: 5\' 4"  (162.6 cm) (from preadmit 06/16/13) Weight: 201 lb 5 oz (91.315 kg) (from preadmit 06/16/13) IBW/kg (Calculated) : 54.7   Vital Signs: Temp: 99.7 F (37.6 C) (09/16 0524) Temp src: Oral (09/16 0524) BP: 181/83 mmHg (09/16 0524) Pulse Rate: 106 (09/16 0524)  Labs:  Recent Labs  06/21/13 0455  HGB 12.3  HCT 37.4  PLT 287  LABPROT 13.7  INR 1.07  CREATININE 1.00    Estimated Creatinine Clearance: 53.2 ml/min (by C-G formula based on Cr of 1).   Medical History: Past Medical History  Diagnosis Date  . Allergy     seasonal  . Depression   . Anxiety   . Arthritis   . Asthma     bronchitis  . GERD (gastroesophageal reflux disease)   . Hyperlipidemia   . Hypertension   . Osteoporosis   . Ulcer     H-pylorie treated  . RSD (reflex sympathetic dystrophy)   . COPD (chronic obstructive pulmonary disease)   . Pneumonia     2013  . Blood clot in vein     LEG    YEARS AGO  . Blood dyscrasia     ELEVATED WBC  HX    Medications:  Prescriptions prior to admission  Medication Sig Dispense Refill  . alendronate (FOSAMAX) 70 MG tablet Take 70 mg by mouth every Monday. Take with a full glass of water on an empty stomach.      Marland Kitchen aspirin 325 MG tablet Take 325 mg by mouth daily as needed for pain.      . cholecalciferol (VITAMIN D) 1000 UNITS tablet Take 1,000 Units by mouth 2 (two) times daily.       Marland Kitchen FLUoxetine (PROZAC) 40 MG capsule Take 40 mg by mouth daily before breakfast.       . fluticasone (FLONASE) 50 MCG/ACT nasal spray Place 2 sprays into the nose daily as needed for allergies.       . Fluticasone-Salmeterol (ADVAIR) 250-50 MCG/DOSE AEPB Inhale 1 puff into the lungs every 12  (twelve) hours.      Marland Kitchen losartan (COZAAR) 100 MG tablet Take 100 mg by mouth every evening.       . lovastatin (MEVACOR) 40 MG tablet Take 40 mg by mouth every evening.      . Multiple Vitamin (MULTIVITAMIN WITH MINERALS) TABS Take 1 tablet by mouth daily.      Marland Kitchen omeprazole (PRILOSEC) 40 MG capsule Take 40 mg by mouth daily.       Marland Kitchen tiotropium (SPIRIVA) 18 MCG inhalation capsule Place 18 mcg into inhaler and inhale daily.        Assessment: 77 y/o F s/p L TKA on 9/15. Started on coumadin 06/20/13 for VTE prophylaxis. Baseline INR 0.95. Baseline CBC WNL.  POD #1 the INR is 1.07.  Hgb slightly decreased from 13.4 to 12.3 today. PLTC [id number]. No bleeding noted.   DVT prophylaxis - coumadin/Lovenox 40 mg SQ q24h and mechanical SCDs  Coumadin predictor score = 3 points  Goal of Therapy:  INR 2-3 Monitor platelets by anticoagulation protocol: Yes   Plan:  Coumadin 5mg  po x 1 tonight Daily PT/INR  Noah Delaine, RPh Clinical Pharmacist Pager: [phone] 06/21/2013,11:47 AM

## 2013-06-21 NOTE — Progress Notes (Signed)
Physical Therapy Treatment Patient Details Name: Deanna Acosta MRN: [id number] DOB: November 25, 1935 Today's Date: 06/21/2013 Time: 4098-1191 PT Time Calculation (min): 26 min  PT Assessment / Plan / Recommendation  History of Present Illness s/p left TKA   PT Comments   Pt limited due to nausea and vomiting at end of session as well as pt continues to be lethargic when not moving.  Pt unable to keep eyes open.  Pt able to increase ambulation distance and improve overall mobility.    Follow Up Recommendations  Home health PT;Supervision/Assistance - 24 hour     Equipment Recommendations  3in1 (PT)    Frequency 7X/week   Progress towards PT Goals Progress towards PT goals: Progressing toward goals  Plan Current plan remains appropriate    Precautions / Restrictions Precautions Precautions: Knee;Fall Required Braces or Orthoses: Knee Immobilizer - Left Knee Immobilizer - Left: On at all times Restrictions Weight Bearing Restrictions: Yes LLE Weight Bearing: Weight bearing as tolerated   Pertinent Vitals/Pain 5/10 left knee    Mobility  Bed Mobility Bed Mobility: Sit to Supine Sit to Supine: 4: Min assist Details for Bed Mobility Assistance: (A) to elevate LE into bed with cues for technique Transfers Transfers: Sit to Stand;Stand to Sit Sit to Stand: 4: Min assist;From chair/3-in-1 Stand to Sit: 4: Min assist;To bed Details for Transfer Assistance: (A) to initiate transfer with max cues for hand placement Ambulation/Gait Ambulation/Gait Assistance: 4: Min assist Ambulation Distance (Feet): 15 Feet Assistive device: Rolling walker Ambulation/Gait Assistance Details: (A) to manage RW with cues for hand placement and LE step sequence.  Pt tends to ambulate too far from RW.  Gait Pattern: Step-to pattern;Decreased stride length;Shuffle Gait velocity: decreased Stairs: No    Exercises     PT Diagnosis:    PT Problem List:   PT Treatment Interventions:     PT Goals  (current goals can now be found in the care plan section) Acute Rehab PT Goals Patient Stated Goal: To eventually go home PT Goal Formulation: With patient/family Time For Goal Achievement: 06/28/13 Potential to Achieve Goals: Good  Visit Information  Last PT Received On: 06/21/13 Assistance Needed: +1 History of Present Illness: s/p left TKA    Subjective Data  Subjective: "I'm feeling nauseated." Patient Stated Goal: To eventually go home   Cognition  Cognition Arousal/Alertness: Lethargic;Suspect due to medications Behavior During Therapy: [facility] for tasks assessed/performed Overall Cognitive Status: Within Functional Limits for tasks assessed    End of Session PT - End of Session Equipment Utilized During Treatment: Gait belt;Oxygen (3L) Activity Tolerance: Patient limited by lethargy;Other (comment) (nausea and vomitting) Patient left: in bed;with call bell/phone within reach;with family/visitor present Nurse Communication: Mobility status   GP     [name] 06/21/2013, 4:38 PM  Jake Shark, PT DPT [phone]

## 2013-06-21 NOTE — Evaluation (Signed)
Physical Therapy Evaluation Patient Details Name: Deanna Acosta MRN: [id number] DOB: 1936/01/06 Today's Date: 06/21/2013 Time: 4098-1191 PT Time Calculation (min): 31 min  PT Assessment / Plan / Recommendation History of Present Illness  s/p left TKA  Clinical Impression  Pt is s/p left TKA resulting in the deficits listed below (see PT Problem List). Limited evaluation due to lethargy questionable due to pain medication.   Pt will benefit from skilled PT to increase their independence and safety with mobility to allow discharge to the venue listed below.      PT Assessment  Patient needs continued PT services    Follow Up Recommendations  Home health PT;Supervision/Assistance - 24 hour    Equipment Recommendations  3in1 (PT)    Frequency 7X/week    Precautions / Restrictions Precautions Precautions: Knee;Fall Required Braces or Orthoses: Knee Immobilizer - Left Knee Immobilizer - Left: On at all times Restrictions Weight Bearing Restrictions: Yes LLE Weight Bearing: Weight bearing as tolerated   Pertinent Vitals/Pain 5/10 left knee; premedicated      Mobility  Bed Mobility Bed Mobility: Supine to Sit;Sitting - Scoot to Edge of Bed Supine to Sit: 3: Mod assist;With rails;HOB elevated Sitting - Scoot to Edge of Bed: 3: Mod assist Details for Bed Mobility Assistance: (A) to elevate trunk OOB and LLE OOB.  Use of pad to assist scooting hips to EOB. Transfers Transfers: Sit to Stand;Stand to Sit Sit to Stand: 3: Mod assist;From bed Stand to Sit: 3: Mod assist;To chair/3-in-1 Details for Transfer Assistance: (A) to initiate transfer with max cues for hand placement Ambulation/Gait Ambulation/Gait Assistance: 4: Min assist Ambulation Distance (Feet): 4 Feet (side steps) Assistive device: Rolling walker Ambulation/Gait Assistance Details: (A) to manage RW with max cues for step sequence Gait Pattern: Step-to pattern;Decreased stride length;Shuffle Gait velocity:  decreased Stairs: No    Exercises Total Joint Exercises Ankle Circles/Pumps: AROM;Both;10 reps Quad Sets: AAROM;Strengthening;Left;5 reps Heel Slides: AAROM;Left;5 reps   PT Diagnosis: Difficulty walking;Acute pain  PT Problem List: Decreased strength;Decreased range of motion;Decreased activity tolerance;Decreased balance;Decreased mobility;Decreased knowledge of use of DME;Decreased knowledge of precautions;Pain PT Treatment Interventions: DME instruction;Gait training;Stair training;Functional mobility training;Therapeutic activities;Therapeutic exercise;Patient/family education     PT Goals(Current goals can be found in the care plan section) Acute Rehab PT Goals Patient Stated Goal: To eventually go home PT Goal Formulation: With patient/family Time For Goal Achievement: 06/28/13 Potential to Achieve Goals: Good  Visit Information  Last PT Received On: 06/21/13 Assistance Needed: +1 History of Present Illness: s/p left TKA       Prior Functioning  Home Living Family/patient expects to be discharged to:: Private residence Available Help at Discharge: Family Type of Home: House Home Access: Stairs to enter Secretary/administrator of Steps: 3 Entrance Stairs-Rails: Right;Left;Can reach both Home Layout: One level Home Equipment: Environmental consultant - 2 wheels Prior Function Level of Independence: Independent Communication Communication: No difficulties Dominant Hand: Right    Cognition  Cognition Arousal/Alertness: Lethargic;Suspect due to medications (difficulty keeping eyes open during initial evaluation) Behavior During Therapy: WFL for tasks assessed/performed Overall Cognitive Status: Within Functional Limits for tasks assessed    Extremity/Trunk Assessment Lower Extremity Assessment Lower Extremity Assessment: LLE deficits/detail LLE Deficits / Details: Limited AAROM knee flexion due to pain 10-55 degrees LLE: Unable to fully assess due to pain   Balance    End of  Session PT - End of Session Equipment Utilized During Treatment: Gait belt;Oxygen (3L) Activity Tolerance: Patient limited by lethargy Patient left: in chair;with call  bell/phone within reach;with family/visitor present Nurse Communication: Mobility status CPM Left Knee CPM Left Knee: On Left Knee Flexion (Degrees): 60 Left Knee Extension (Degrees): 0  GP     Haden Suder 06/21/2013, 9:21 AM  Jake Shark, PT DPT [phone]

## 2013-06-22 ENCOUNTER — Inpatient Hospital Stay (HOSPITAL_COMMUNITY): Payer: Medicare Other

## 2013-06-22 LAB — CBC
HCT: 34.8 % — ABNORMAL LOW (ref 36.0–46.0)
Hemoglobin: 11.7 g/dL — ABNORMAL LOW (ref 12.0–15.0)
MCH: 28.3 pg (ref 26.0–34.0)
MCHC: 33.6 g/dL (ref 30.0–36.0)
MCV: 84.3 fL (ref 78.0–100.0)
Platelets: 269 10*3/uL (ref 150–400)
RBC: 4.13 MIL/uL (ref 3.87–5.11)
RDW: 14.1 % (ref 11.5–15.5)
WBC: 14.8 10*3/uL — ABNORMAL HIGH (ref 4.0–10.5)

## 2013-06-22 LAB — PROTIME-INR
INR: 1.27 (ref 0.00–1.49)
Prothrombin Time: 15.6 seconds — ABNORMAL HIGH (ref 11.6–15.2)

## 2013-06-22 MED ORDER — WARFARIN SODIUM 7.5 MG PO TABS
7.5000 mg | ORAL_TABLET | Freq: Once | ORAL | Status: AC
  Administered 2013-06-22: 7.5 mg via ORAL
  Filled 2013-06-22: qty 1

## 2013-06-22 MED ORDER — HYDROCODONE-ACETAMINOPHEN 5-325 MG PO TABS
1.0000 | ORAL_TABLET | ORAL | Status: DC | PRN
  Administered 2013-06-22 – 2013-06-23 (×5): 1 via ORAL
  Filled 2013-06-22 (×5): qty 1

## 2013-06-22 MED ORDER — MORPHINE SULFATE 2 MG/ML IJ SOLN: 1.0000 mg | INTRAMUSCULAR | Status: DC | PRN

## 2013-06-22 NOTE — Progress Notes (Signed)
Orthopedics Progress Note  Subjective: I am nauseated.  Objective:  Filed Vitals:   06/22/13 0609  BP: 148/79  Pulse: 88  Temp: 97.6 F (36.4 C)  Resp: 18    General: Awake and alert  Musculoskeletal: left knee bruised and swollen..hemarthrosis.  Able to get ot 5 degrees minus full extension Calf pumps without pain. Neurovascularly intact  Lab Results  Component Value Date   WBC 14.8* 06/22/2013   HGB 11.7* 06/22/2013   HCT 34.8* 06/22/2013   MCV 84.3 06/22/2013   PLT 269 06/22/2013       Component Value Date/Time   NA 134* 06/21/2013 0455   K 4.6 06/21/2013 0455   CL 99 06/21/2013 0455   CO2 24 06/21/2013 0455   GLUCOSE 145* 06/21/2013 0455   BUN 12 06/21/2013 0455   CREATININE 1.00 06/21/2013 0455   CALCIUM 9.0 06/21/2013 0455   GFRNONAA 54* 06/21/2013 0455   GFRAA 62* 06/21/2013 0455    Lab Results  Component Value Date   INR 1.27 06/22/2013   INR 1.07 06/21/2013   INR 0.95 06/16/2013    Assessment/Plan: POD #2 s/p Procedure(s): LEFT TOTAL KNEE ARTHROPLASTY WBC remains elevated, U/A clear from yesterday. Will check a CXR as she is only satting 92% on O2 Continue Lovenox and Coumadin, INR still subtherapeutic Continue PT,OT, D/C planning Patient would like to go home with home health PT,OT,RN as long as safe  Viviann Spare R. Ranell Patrick, MD 06/22/2013 7:52 AM

## 2013-06-22 NOTE — Progress Notes (Signed)
OT Cancellation Note  Patient Details Name: Deanna Acosta MRN: [id number] DOB: [birth year]   Cancelled Treatment:    Reason Eval/Treat Not Completed: Pain limiting ability to participate;Fatigue/lethargy limiting ability to participate. Pt had just finished working with PT and reporting too fatigued for OT session at this time. Will re-attempt later today as time allows.  06/22/2013 Cipriano Mile OTR/L Pager [phone] Office [phone]

## 2013-06-22 NOTE — Progress Notes (Signed)
Physical Therapy Treatment Patient Details Name: Deanna Acosta MRN: [id number] DOB: 09-Feb-1936 Today's Date: 06/22/2013 Time: 7829-5621 PT Time Calculation (min): 14 min  PT Assessment / Plan / Recommendation  History of Present Illness s/p left TKA   PT Comments   Patient progressing with ambulation. Complaining of back pain. Planning to DC tomorrow. Will need to practice steps  Follow Up Recommendations  Home health PT;Supervision/Assistance - 24 hour     Does the patient have the potential to tolerate intense rehabilitation     Barriers to Discharge        Equipment Recommendations  3in1 (PT)    Recommendations for Other Services    Frequency 7X/week   Progress towards PT Goals Progress towards PT goals: Progressing toward goals  Plan Current plan remains appropriate    Precautions / Restrictions Precautions Precautions: Knee;Fall Required Braces or Orthoses: Knee Immobilizer - Left Knee Immobilizer - Left: On at all times Restrictions LLE Weight Bearing: Weight bearing as tolerated   Pertinent Vitals/Pain Complains of back pain.  RN aware   Mobility  Bed Mobility Supine to Sit: 4: Min assist;With rails;HOB flat Details for Bed Mobility Assistance: (A) to elevate LE in/out of bed with cues for technique Transfers Sit to Stand: 4: Min assist;From bed Stand to Sit: 4: Min assist;To bed Details for Transfer Assistance: (A) to initiate transfer. CUes for safe technique and hand placement Ambulation/Gait Ambulation/Gait Assistance: 4: Min assist Ambulation Distance (Feet): 40 Feet Assistive device: Rolling walker Ambulation/Gait Assistance Details: A for RW management. Patient with slight posterior lean this session. Cues for posture.  Gait Pattern: Step-to pattern;Decreased stride length;Shuffle Gait velocity: decreased    Exercises     PT Diagnosis:    PT Problem List:   PT Treatment Interventions:     PT Goals (current goals can now be found in the  care plan section)    Visit Information  Assistance Needed: +1 History of Present Illness: s/p left TKA    Subjective Data      Cognition  Cognition Arousal/Alertness: Awake/alert Behavior During Therapy: WFL for tasks assessed/performed Overall Cognitive Status: Within Functional Limits for tasks assessed    Balance     End of Session PT - End of Session Equipment Utilized During Treatment: Gait belt Activity Tolerance: Patient tolerated treatment well Patient left: in bed;with call bell/phone within reach Nurse Communication: Mobility status   GP     Fredrich Birks 06/22/2013, 3:09 PM 06/22/2013 Fredrich Birks PTA [phone] pager [phone] office

## 2013-06-22 NOTE — Progress Notes (Signed)
Physical Therapy Treatment Patient Details Name: Deanna Acosta MRN: [id number] DOB: [birth year] Today's Date: 06/22/2013 Time: 4098-1191 PT Time Calculation (min): 24 min  PT Assessment / Plan / Recommendation  History of Present Illness s/p left TKA   PT Comments   Patient limited by fatigue. Patient is going down for chest xray this morning so she was returned to bed. WIll work on increasing ambulation this afternoon.   Follow Up Recommendations  Home health PT;Supervision/Assistance - 24 hour     Does the patient have the potential to tolerate intense rehabilitation     Barriers to Discharge        Equipment Recommendations  3in1 (PT)    Recommendations for Other Services    Frequency 7X/week   Progress towards PT Goals Progress towards PT goals: Progressing toward goals  Plan Current plan remains appropriate    Precautions / Restrictions Precautions Precautions: Knee;Fall Required Braces or Orthoses: Knee Immobilizer - Left Knee Immobilizer - Left: On at all times Restrictions LLE Weight Bearing: Weight bearing as tolerated   Pertinent Vitals/Pain no apparent distress    Mobility  Bed Mobility Sit to Supine: 4: Min assist Details for Bed Mobility Assistance: (A) to elevate LE into bed with cues for technique Transfers Sit to Stand: 4: Min assist;From chair/3-in-1 Stand to Sit: 4: Min assist;To bed Details for Transfer Assistance: (A) to initiate transfer. CUes for safe technique and hand placement Ambulation/Gait Ambulation/Gait Assistance: 4: Min assist Ambulation Distance (Feet): 25 Feet Assistive device: Rolling walker Ambulation/Gait Assistance Details: A for RW managment. Cues for sequency and technique Gait Pattern: Step-to pattern;Decreased stride length;Shuffle Gait velocity: decreased    Exercises Total Joint Exercises Quad Sets: Strengthening;Left;10 reps;AROM Short Arc Quad: AAROM;Left;10 reps Heel Slides: AAROM;Left;10 reps Hip  ABduction/ADduction: AAROM;Left;10 reps Straight Leg Raises: AAROM;Left;10 reps   PT Diagnosis:    PT Problem List:   PT Treatment Interventions:     PT Goals (current goals can now be found in the care plan section)    Visit Information  Last PT Received On: 06/22/13 Assistance Needed: +1 History of Present Illness: s/p left TKA    Subjective Data      Cognition  Cognition Arousal/Alertness: Awake/alert Behavior During Therapy: WFL for tasks assessed/performed Overall Cognitive Status: Within Functional Limits for tasks assessed    Balance     End of Session PT - End of Session Equipment Utilized During Treatment: Gait belt Activity Tolerance: Patient limited by fatigue;Patient tolerated treatment well Patient left: in bed;with call bell/phone within reach Nurse Communication: Mobility status   GP     Fredrich Birks 06/22/2013, 9:45 AM 06/22/2013 Fredrich Birks PTA [phone] pager [phone] office

## 2013-06-22 NOTE — Progress Notes (Signed)
ANTICOAGULATION CONSULT NOTE - Follow up  Pharmacy Consult for Coumadin Indication: VTE prophylaxis  Allergies  Allergen Reactions  . Penicillins Hives  . Prednisone Other (See Comments)    Severe Headache and Flushing  . Levofloxacin Rash    Patient Measurements: Height: 5\' 4"  (162.6 cm) (from preadmit 06/16/13) Weight: 201 lb 8 oz (91.4 kg) IBW/kg (Calculated) : 54.7   Vital Signs: Temp: 97.6 F (36.4 C) (09/17 0609) BP: 148/79 mmHg (09/17 0609) Pulse Rate: 88 (09/17 0609)  Labs:  Recent Labs  06/21/13 0455 06/22/13 0440  HGB 12.3 11.7*  HCT 37.4 34.8*  PLT 287 269  LABPROT 13.7 15.6*  INR 1.07 1.27  CREATININE 1.00  --     Estimated Creatinine Clearance: 53.3 ml/min (by C-G formula based on Cr of 1).   Medical History: Past Medical History  Diagnosis Date  . Allergy     seasonal  . Depression   . Anxiety   . Arthritis   . Asthma     bronchitis  . GERD (gastroesophageal reflux disease)   . Hyperlipidemia   . Hypertension   . Osteoporosis   . Ulcer     H-pylorie treated  . RSD (reflex sympathetic dystrophy)   . COPD (chronic obstructive pulmonary disease)   . Pneumonia     2013  . Blood clot in vein     LEG    YEARS AGO  . Blood dyscrasia     ELEVATED WBC  HX    Medications:  Prescriptions prior to admission  Medication Sig Dispense Refill  . alendronate (FOSAMAX) 70 MG tablet Take 70 mg by mouth every Monday. Take with a full glass of water on an empty stomach.      Marland Kitchen aspirin 325 MG tablet Take 325 mg by mouth daily as needed for pain.      . cholecalciferol (VITAMIN D) 1000 UNITS tablet Take 1,000 Units by mouth 2 (two) times daily.       Marland Kitchen FLUoxetine (PROZAC) 40 MG capsule Take 40 mg by mouth daily before breakfast.       . fluticasone (FLONASE) 50 MCG/ACT nasal spray Place 2 sprays into the nose daily as needed for allergies.       . Fluticasone-Salmeterol (ADVAIR) 250-50 MCG/DOSE AEPB Inhale 1 puff into the lungs every 12 (twelve)  hours.      Marland Kitchen losartan (COZAAR) 100 MG tablet Take 100 mg by mouth every evening.       . lovastatin (MEVACOR) 40 MG tablet Take 40 mg by mouth every evening.      . Multiple Vitamin (MULTIVITAMIN WITH MINERALS) TABS Take 1 tablet by mouth daily.      Marland Kitchen omeprazole (PRILOSEC) 40 MG capsule Take 40 mg by mouth daily.       Marland Kitchen tiotropium (SPIRIVA) 18 MCG inhalation capsule Place 18 mcg into inhaler and inhale daily.        Assessment: 77 y/o F s/p L TKA on 9/15. Started on coumadin 06/20/13 for VTE prophylaxis. Baseline INR 0.95. Baseline CBC WNL.  POD #2 the INR is 1.27.  Hgb 11.7, PLTC 269K. No bleeding noted.   DVT prophylaxis - coumadin/Lovenox 40 mg SQ q24h and mechanical SCDs  Coumadin predictor score = 3 points  Goal of Therapy:  INR 2-3 Monitor platelets by anticoagulation protocol: Yes   Plan:  Coumadin 7.5mg  po x 1 tonight Daily PT/INR  Noah Delaine, RPh Clinical Pharmacist Pager: [phone] 06/22/2013,12:04 PM

## 2013-06-23 LAB — CBC
HCT: 32.2 % — ABNORMAL LOW (ref 36.0–46.0)
Hemoglobin: 10.7 g/dL — ABNORMAL LOW (ref 12.0–15.0)
MCH: 28.2 pg (ref 26.0–34.0)
MCHC: 33.2 g/dL (ref 30.0–36.0)
MCV: 85 fL (ref 78.0–100.0)
Platelets: 215 10*3/uL (ref 150–400)
RBC: 3.79 MIL/uL — ABNORMAL LOW (ref 3.87–5.11)
RDW: 14.4 % (ref 11.5–15.5)
WBC: 10.4 10*3/uL (ref 4.0–10.5)

## 2013-06-23 LAB — URINE CULTURE
Colony Count: NO GROWTH
Culture: NO GROWTH

## 2013-06-23 LAB — PROTIME-INR
INR: 1.48 (ref 0.00–1.49)
Prothrombin Time: 17.5 seconds — ABNORMAL HIGH (ref 11.6–15.2)

## 2013-06-23 MED ORDER — WARFARIN SODIUM 5 MG PO TABS: 5.0000 mg | ORAL_TABLET | Freq: Every day | ORAL | Status: DC

## 2013-06-23 MED ORDER — HYDROCODONE-ACETAMINOPHEN 5-325 MG PO TABS: 1.0000 | ORAL_TABLET | ORAL | Status: DC | PRN

## 2013-06-23 MED ORDER — WARFARIN SODIUM 5 MG PO TABS: 5.0000 mg | ORAL_TABLET | Freq: Once | ORAL | Status: DC

## 2013-06-23 NOTE — Progress Notes (Signed)
   Subjective: 3 Days Post-Op Procedure(s) (LRB): LEFT TOTAL KNEE ARTHROPLASTY (Left)  Pt doing well Therapy has gone well Ready for d/c home Patient reports pain as mild.  Objective:   VITALS:   Filed Vitals:   06/23/13 0756  BP: 137/54  Pulse: 98  Temp: 97.5 F (36.4 C)  Resp: 20    Left knee incision healing well nv intact distally No rashes or edema  LABS  Recent Labs  06/21/13 0455 06/22/13 0440 06/23/13 0651  HGB 12.3 11.7* 10.7*  HCT 37.4 34.8* 32.2*  WBC 15.0* 14.8* 10.4  PLT 287 269 215     Recent Labs  06/21/13 0455  NA 134*  K 4.6  BUN 12  CREATININE 1.00  GLUCOSE 145*     Assessment/Plan: 3 Days Post-Op Procedure(s) (LRB): LEFT TOTAL KNEE ARTHROPLASTY (Left)  D/c home today Pt doing well F/u in 2 weeks  Alphonsa Overall, MPAS, PA-C  06/23/2013, 11:42 AM

## 2013-06-23 NOTE — Progress Notes (Signed)
Physical Therapy Treatment Patient Details Name: Deanna Acosta MRN: [id number] DOB: 08-Aug-1936 Today's Date: 06/23/2013 Time: 5784-6962 PT Time Calculation (min): 24 min  PT Assessment / Plan / Recommendation  History of Present Illness s/p left TKA   PT Comments   Patient progressing well with ambulation this session. Motivated to be able to go home later today. Will attempt steps next session.   Follow Up Recommendations  Home health PT;Supervision/Assistance - 24 hour     Does the patient have the potential to tolerate intense rehabilitation     Barriers to Discharge        Equipment Recommendations       Recommendations for Other Services    Frequency 7X/week   Progress towards PT Goals Progress towards PT goals: Progressing toward goals  Plan Current plan remains appropriate    Precautions / Restrictions Precautions Precautions: Knee;Fall Required Braces or Orthoses: Knee Immobilizer - Left Knee Immobilizer - Left: On at all times Restrictions Weight Bearing Restrictions: Yes LLE Weight Bearing: Weight bearing as tolerated   Pertinent Vitals/Pain no apparent distress    Mobility  Bed Mobility Supine to Sit: 4: Min assist;With rails;HOB flat Details for Bed Mobility Assistance: Husband able to assist patient out of bed. Just A for LE Transfers Sit to Stand: 4: Min assist;From bed;With upper extremity assist Stand to Sit: 4: Min guard;To chair/3-in-1 Details for Transfer Assistance: A for balance with stand. Better control today. Cues for safe hand placement Ambulation/Gait Ambulation/Gait Assistance: 4: Min guard Ambulation Distance (Feet): 70 Feet Assistive device: Rolling walker Ambulation/Gait Assistance Details: Patient with better posture and control with gait this session.  Gait Pattern: Step-to pattern    Exercises Total Joint Exercises Quad Sets: Strengthening;Left;10 reps;AROM Short Arc Quad: AAROM;Left;10 reps Heel Slides: AAROM;Left;10  reps Hip ABduction/ADduction: AAROM;Left;10 reps Straight Leg Raises: AAROM;Left;10 reps   PT Diagnosis:    PT Problem List:   PT Treatment Interventions:     PT Goals (current goals can now be found in the care plan section)    Visit Information  Last PT Received On: 06/23/13 Assistance Needed: +1 History of Present Illness: s/p left TKA    Subjective Data      Cognition  Cognition Arousal/Alertness: Awake/alert Behavior During Therapy: WFL for tasks assessed/performed Overall Cognitive Status: Within Functional Limits for tasks assessed    Balance     End of Session PT - End of Session Equipment Utilized During Treatment: Gait belt Activity Tolerance: Patient tolerated treatment well Patient left: with call bell/phone within reach;in chair Nurse Communication: Mobility status   GP     Fredrich Birks 06/23/2013, 11:22 AM 06/23/2013 Fredrich Birks PTA [phone] pager [phone] office

## 2013-06-23 NOTE — Discharge Summary (Signed)
Physician Discharge Summary   Patient ID: Deanna Acosta MRN: [id number] DOB/AGE: March 06, 1936 77 y.o.  Admit date: 06/20/2013 Discharge date: 06/23/2013  Admission Diagnoses:  Left knee end stage osteoarthritis  Discharge Diagnoses:  Same   Surgeries: Procedure(s): LEFT TOTAL KNEE ARTHROPLASTY on 06/20/2013   Consultants: PT/OT  Discharged Condition: Stable  Hospital Course: Deanna Acosta is Deanna 77 y.o. female who was admitted 06/20/2013 with a chief complaint of No chief complaint on file. , and found to have a diagnosis of <principal problem not specified>.  They were brought to the operating room on 06/20/2013 and underwent the above named procedures.    The patient had Deanna uncomplicated hospital course and was stable for discharge.  Recent vital signs:  Filed Vitals:   06/23/13 0756  BP: 137/54  Pulse: 98  Temp: 97.5 F (36.4 C)  Resp: 20    Recent laboratory studies:  Results for orders placed during the hospital encounter of 06/20/13  URINE CULTURE      Result Value Range   Specimen Description URINE, RANDOM     Special Requests NONE     Culture  Setup Time       Value: 06/21/2013 19:41     Performed at [facility] Count       Value: NO GROWTH     Performed at [facility]   Culture       Value: NO GROWTH     Performed at [facility]   Report Status 06/23/2013 FINAL    PROTIME-INR      Result Value Range   Prothrombin Time 13.7  11.6 - 15.2 seconds   INR 1.07  0.00 - 1.49  CBC      Result Value Range   WBC 15.0 (*) 4.0 - 10.5 K/uL   RBC 4.31  3.87 - 5.11 MIL/uL   Hemoglobin 12.3  12.0 - 15.0 g/dL   HCT 21.3  08.6 - 57.8 %   MCV 86.8  78.0 - 100.0 fL   MCH 28.5  26.0 - 34.0 pg   MCHC 32.9  30.0 - 36.0 g/dL   RDW 46.9  62.9 - 52.8 %   Platelets 287  150 - 400 K/uL  BASIC METABOLIC PANEL      Result Value Range   Sodium 134 (*) 135 - 145 mEq/L   Potassium 4.6  3.5 - 5.1 mEq/L   Chloride 99  96 - 112 mEq/L     CO2 24  19 - 32 mEq/L   Glucose, Bld 145 (*) 70 - 99 mg/dL   BUN 12  6 - 23 mg/dL   Creatinine, Ser 4.13  0.50 - 1.10 mg/dL   Calcium 9.0  8.4 - 24.4 mg/dL   GFR calc non Af Amer 54 (*) >90 mL/min   GFR calc Af Amer 62 (*) >90 mL/min  PROTIME-INR      Result Value Range   Prothrombin Time 15.6 (*) 11.6 - 15.2 seconds   INR 1.27  0.00 - 1.49  CBC      Result Value Range   WBC 14.8 (*) 4.0 - 10.5 K/uL   RBC 4.13  3.87 - 5.11 MIL/uL   Hemoglobin 11.7 (*) 12.0 - 15.0 g/dL   HCT 01.0 (*) 27.2 - 53.6 %   MCV 84.3  78.0 - 100.0 fL   MCH 28.3  26.0 - 34.0 pg   MCHC 33.6  30.0 - 36.0 g/dL   RDW 64.4  11.5 - 15.5 %   Platelets 269  150 - 400 K/uL  URINALYSIS, ROUTINE W REFLEX MICROSCOPIC      Result Value Range   Color, Urine YELLOW  YELLOW   APPearance CLEAR  CLEAR   Specific Gravity, Urine 1.023  1.005 - 1.030   pH 5.5  5.0 - 8.0   Glucose, UA 100 (*) NEGATIVE mg/dL   Hgb urine dipstick NEGATIVE  NEGATIVE   Bilirubin Urine SMALL (*) NEGATIVE   Ketones, ur NEGATIVE  NEGATIVE mg/dL   Protein, ur NEGATIVE  NEGATIVE mg/dL   Urobilinogen, UA 1.0  0.0 - 1.0 mg/dL   Nitrite NEGATIVE  NEGATIVE   Leukocytes, UA NEGATIVE  NEGATIVE  PROTIME-INR      Result Value Range   Prothrombin Time 17.5 (*) 11.6 - 15.2 seconds   INR 1.48  0.00 - 1.49  CBC      Result Value Range   WBC 10.4  4.0 - 10.5 K/uL   RBC 3.79 (*) 3.87 - 5.11 MIL/uL   Hemoglobin 10.7 (*) 12.0 - 15.0 g/dL   HCT 40.9 (*) 81.1 - 91.4 %   MCV 85.0  78.0 - 100.0 fL   MCH 28.2  26.0 - 34.0 pg   MCHC 33.2  30.0 - 36.0 g/dL   RDW 78.2  95.6 - 21.3 %   Platelets 215  150 - 400 K/uL    Discharge Medications:     Medication List         alendronate 70 MG tablet  Commonly known as:  FOSAMAX  Take 70 mg by mouth every Monday. Take with a full glass of water on Deanna empty stomach.     aspirin 325 MG tablet  Take 325 mg by mouth daily as needed for pain.     cholecalciferol 1000 UNITS tablet  Commonly known as:  VITAMIN D   Take 1,000 Units by mouth 2 (two) times daily.     FLUoxetine 40 MG capsule  Commonly known as:  PROZAC  Take 40 mg by mouth daily before breakfast.     fluticasone 50 MCG/ACT nasal spray  Commonly known as:  FLONASE  Place 2 sprays into the nose daily as needed for allergies.     Fluticasone-Salmeterol 250-50 MCG/DOSE Aepb  Commonly known as:  ADVAIR  Inhale 1 puff into the lungs every 12 (twelve) hours.     HYDROcodone-acetaminophen 5-325 MG per tablet  Commonly known as:  NORCO/VICODIN  Take 1-2 tablets by mouth every 4 (four) hours as needed.     losartan 100 MG tablet  Commonly known as:  COZAAR  Take 100 mg by mouth every evening.     lovastatin 40 MG tablet  Commonly known as:  MEVACOR  Take 40 mg by mouth every evening.     multivitamin with minerals Tabs tablet  Take 1 tablet by mouth daily.     omeprazole 40 MG capsule  Commonly known as:  PRILOSEC  Take 40 mg by mouth daily.     tiotropium 18 MCG inhalation capsule  Commonly known as:  SPIRIVA  Place 18 mcg into inhaler and inhale daily.     warfarin 5 MG tablet  Commonly known as:  COUMADIN  Take 1 tablet (5 mg total) by mouth daily.        Diagnostic Studies: Dg Chest 2 View  06/22/2013   CLINICAL DATA:  Elevated white blood cell count  EXAM: CHEST  2 VIEW  COMPARISON:  Radiograph 06/03/2013  FINDINGS: Exam is lordotic. Stable mildly enlarged cardiac silhouette. There are low lung volumes. There is central bronchitic markings similar to prior. On lateral projection there is new atelectasis in the lingula.  IMPRESSION: New lingular atelectasis versus infiltrate.  Low lung volumes.   Electronically Signed   By: Genevive Bi M.D.   On: 06/22/2013 10:56   Dg Knee Left Port  06/20/2013   CLINICAL DATA:  Postop left knee  EXAM: PORTABLE LEFT KNEE - 1-2 VIEW  COMPARISON:  04/20/2008  FINDINGS: A new prosthesis has been placed. The femoral and tibial prosthetic components are well-seated and aligned as is  the patellar button. There is no acute fracture or evidence of Deanna operative complication.  IMPRESSION: Left knee arthroplasty components are well-seated and aligned.   Electronically Signed   By: Amie Portland   On: 06/20/2013 17:20    Disposition: 01-Home or Self Care      Discharge Orders   Future Appointments Provider Department Dept Phone   07/13/2013 11:30 AM [facility] [phone]   Patient should wear two piece clothing and wear no powder or deodorant. Patient should arrive 15 minutes early.   Future Orders Complete By Expires   Call MD / Call 911  As directed    Comments:     If you experience chest pain or shortness of breath, CALL 911 and be transported to the hospital emergency room.  If you develope a fever above 101 F, pus (white drainage) or increased drainage or redness at the wound, or calf pain, call your surgeon's office.   Constipation Prevention  As directed    Comments:     Drink plenty of fluids.  Prune juice may be helpful.  You may use a stool softener, such as Colace (over the counter) 100 mg twice a day.  Use MiraLax (over the counter) for constipation as needed.   Diet - low sodium heart healthy  As directed    Increase activity slowly as tolerated  As directed       Follow-up Information   Follow up with NORRIS,STEVEN R, MD. Call in 2 weeks. [phone])    Specialty:  Orthopedic Surgery   Contact information:   [address] [place] [address] [phone]        Signed: Thea Gist 06/23/2013, 11:42 AM

## 2013-06-23 NOTE — Progress Notes (Signed)
ANTICOAGULATION CONSULT NOTE - Follow up  Pharmacy Consult for Coumadin Indication: VTE prophylaxis  Allergies  Allergen Reactions  . Penicillins Hives  . Prednisone Other (See Comments)    Severe Headache and Flushing  . Levofloxacin Rash    Patient Measurements: Height: 5\' 4"  (162.6 cm) (from preadmit 06/16/13) Weight: 201 lb 8 oz (91.4 kg) IBW/kg (Calculated) : 54.7   Vital Signs: Temp: 97.5 F (36.4 C) (09/18 0756) Temp src: Oral (09/18 0756) BP: 137/54 mmHg (09/18 0756) Pulse Rate: 98 (09/18 0756)  Labs:  Recent Labs  06/21/13 0455 06/22/13 0440 06/23/13 0651  HGB 12.3 11.7* 10.7*  HCT 37.4 34.8* 32.2*  PLT 287 269 215  LABPROT 13.7 15.6* 17.5*  INR 1.07 1.27 1.48  CREATININE 1.00  --   --     Estimated Creatinine Clearance: 53.3 ml/min (by C-G formula based on Cr of 1).   Medical History: Past Medical History  Diagnosis Date  . Allergy     seasonal  . Depression   . Anxiety   . Arthritis   . Asthma     bronchitis  . GERD (gastroesophageal reflux disease)   . Hyperlipidemia   . Hypertension   . Osteoporosis   . Ulcer     H-pylorie treated  . RSD (reflex sympathetic dystrophy)   . COPD (chronic obstructive pulmonary disease)   . Pneumonia     2013  . Blood clot in vein     LEG    YEARS AGO  . Blood dyscrasia     ELEVATED WBC  HX    Medications:  Prescriptions prior to admission  Medication Sig Dispense Refill  . alendronate (FOSAMAX) 70 MG tablet Take 70 mg by mouth every Monday. Take with a full glass of water on an empty stomach.      Marland Kitchen aspirin 325 MG tablet Take 325 mg by mouth daily as needed for pain.      . cholecalciferol (VITAMIN D) 1000 UNITS tablet Take 1,000 Units by mouth 2 (two) times daily.       Marland Kitchen FLUoxetine (PROZAC) 40 MG capsule Take 40 mg by mouth daily before breakfast.       . fluticasone (FLONASE) 50 MCG/ACT nasal spray Place 2 sprays into the nose daily as needed for allergies.       . Fluticasone-Salmeterol  (ADVAIR) 250-50 MCG/DOSE AEPB Inhale 1 puff into the lungs every 12 (twelve) hours.      Marland Kitchen losartan (COZAAR) 100 MG tablet Take 100 mg by mouth every evening.       . lovastatin (MEVACOR) 40 MG tablet Take 40 mg by mouth every evening.      . Multiple Vitamin (MULTIVITAMIN WITH MINERALS) TABS Take 1 tablet by mouth daily.      Marland Kitchen omeprazole (PRILOSEC) 40 MG capsule Take 40 mg by mouth daily.       Marland Kitchen tiotropium (SPIRIVA) 18 MCG inhalation capsule Place 18 mcg into inhaler and inhale daily.        Assessment: 77 y/o F s/p L TKA on 9/15. Started on coumadin 06/20/13 for VTE prophylaxis.  POD #3 the INR (1.48) is subtherapeutic but trending up slowly. Also on lovenox 40,  Hgb 10/7, PLTC 215K, stable. No bleeding noted.   DVT prophylaxis - coumadin/Lovenox 40 mg SQ q24h and mechanical SCDs  Coumadin predictor score = 3 points  Goal of Therapy:  INR 2-3 Monitor platelets by anticoagulation protocol: Yes   Plan:  Coumadin 5mg  po x 1 tonight  Daily PT/INR If going home today recommend continue 5mg  daily over the weekend, and check INR on Monday.  Bayard Hugger, PharmD, BCPS  Clinical Pharmacist  Pager: [phone]   06/23/2013,1:44 PM

## 2013-06-23 NOTE — Progress Notes (Signed)
OT Cancellation Note  Patient Details Name: Deanna Acosta MRN: [id number] DOB: December 31, 1935   Cancelled Treatment:    Reason Eval/Treat Not Completed: OT screened, no needs identified, will sign off Pt agreeable and ready for d/c home. Pt states "ill be fine when I get home"  Harolyn Rutherford Pager: [phone]  06/23/2013, 1:25 PM

## 2013-06-23 NOTE — Progress Notes (Signed)
Physical Therapy Treatment Patient Details Name: Deanna Acosta MRN: [id number] DOB: 24-Feb-1936 Today's Date: 06/23/2013 Time: 7829-5621 PT Time Calculation (min): 24 min  PT Assessment / Plan / Recommendation  History of Present Illness s/p left TKA   PT Comments   Completed stair training. PA in at end of session to DC patient  Follow Up Recommendations  Home health PT;Supervision/Assistance - 24 hour     Does the patient have the potential to tolerate intense rehabilitation     Barriers to Discharge        Equipment Recommendations  3in1 (PT)    Recommendations for Other Services    Frequency 7X/week   Progress towards PT Goals Progress towards PT goals: Progressing toward goals  Plan Current plan remains appropriate    Precautions / Restrictions Precautions Precautions: Knee;Fall Required Braces or Orthoses: Knee Immobilizer - Left Knee Immobilizer - Left: On at all times Restrictions LLE Weight Bearing: Weight bearing as tolerated   Pertinent Vitals/Pain no apparent distress     Mobility  Bed Mobility Bed Mobility: Not assessed Supine to Sit: 4: Min assist;With rails;HOB flat Details for Bed Mobility Assistance: Husband able to assist patient out of bed. Just A for LE Transfers Sit to Stand: 4: Min guard;With armrests;From chair/3-in-1 Stand to Sit: 4: Min guard;To chair/3-in-1 Details for Transfer Assistance: A for balance with stand. Better control today. Cues for safe hand placement Ambulation/Gait Ambulation/Gait Assistance: 4: Min guard Ambulation Distance (Feet): 50 Feet Assistive device: Rolling walker Ambulation/Gait Assistance Details: Patient with better posture and control with gait this session.  Gait Pattern: Step-to pattern Gait velocity: decreased Stairs: Yes Stairs Assistance: 4: Min guard Stair Management Technique: One rail Left;Sideways Number of Stairs: 3    Exercises Total Joint Exercises Quad Sets: Strengthening;Left;10  reps;AROM Short Arc Quad: AAROM;Left;10 reps Heel Slides: AAROM;Left;10 reps Hip ABduction/ADduction: AAROM;Left;10 reps Straight Leg Raises: AAROM;Left;10 reps   PT Diagnosis:    PT Problem List:   PT Treatment Interventions:     PT Goals (current goals can now be found in the care plan section)    Visit Information  Last PT Received On: 06/23/13 Assistance Needed: +1 History of Present Illness: s/p left TKA    Subjective Data      Cognition  Cognition Arousal/Alertness: Awake/alert Behavior During Therapy: WFL for tasks assessed/performed Overall Cognitive Status: Within Functional Limits for tasks assessed    Balance     End of Session PT - End of Session Equipment Utilized During Treatment: Gait belt Activity Tolerance: Patient tolerated treatment well Patient left: with call bell/phone within reach;in chair Nurse Communication: Mobility status   GP     Fredrich Birks 06/23/2013, 1:36 PM  06/23/2013 Fredrich Birks PTA [phone] pager [phone] office

## 2013-06-28 NOTE — Progress Notes (Signed)
SW received a consult for possible placement. PT  At this time is recommending home with HH and not SNF.  Clinical Social Worker will sign off for now as social work intervention is no longer needed. Please consult us again if new need arises.   Amarii Tiny Chaudhary, MSW [phone] 

## 2013-07-13 ENCOUNTER — Ambulatory Visit: Payer: Medicare Other

## 2013-10-10 ENCOUNTER — Ambulatory Visit: Payer: Medicare Other

## 2013-10-24 ENCOUNTER — Ambulatory Visit
Admission: RE | Admit: 2013-10-24 | Discharge: 2013-10-24 | Disposition: A | Discharge status: Home / Self Care | Payer: Medicare Other | Source: Ambulatory Visit

## 2013-10-24 DIAGNOSIS — Z1231 Encounter for screening mammogram for malignant neoplasm of breast: Secondary | ICD-10-CM

## 2013-11-09 ENCOUNTER — Encounter (HOSPITAL_COMMUNITY)
Admission: RE | Admit: 2013-11-09 | Discharge: 2013-11-09 | Disposition: A | Discharge status: Home / Self Care | Payer: Medicare Other | Source: Ambulatory Visit | Attending: Ophthalmology | Admitting: Ophthalmology

## 2013-11-09 ENCOUNTER — Encounter (HOSPITAL_COMMUNITY): Payer: Self-pay

## 2013-11-09 DIAGNOSIS — Z01810 Encounter for preprocedural cardiovascular examination: Secondary | ICD-10-CM | POA: Insufficient documentation

## 2013-11-09 DIAGNOSIS — Z01812 Encounter for preprocedural laboratory examination: Secondary | ICD-10-CM | POA: Insufficient documentation

## 2013-11-09 HISTORY — DX: Shortness of breath: R06.02

## 2013-11-09 LAB — BASIC METABOLIC PANEL
BUN: 16 mg/dL (ref 6–23)
CO2: 23 mEq/L (ref 19–32)
Calcium: 9.5 mg/dL (ref 8.4–10.5)
Chloride: 104 mEq/L (ref 96–112)
Creatinine, Ser: 1.04 mg/dL (ref 0.50–1.10)
GFR calc Af Amer: 59 mL/min — ABNORMAL LOW (ref >90)
GFR calc non Af Amer: 51 mL/min — ABNORMAL LOW (ref >90)
Glucose, Bld: 106 mg/dL — ABNORMAL HIGH (ref 70–99)
Potassium: 4.5 mEq/L (ref 3.7–5.3)
Sodium: 139 mEq/L (ref 137–147)

## 2013-11-09 LAB — HEMOGLOBIN AND HEMATOCRIT, BLOOD
HCT: 37.9 % (ref 36.0–46.0)
Hemoglobin: 12.4 g/dL (ref 12.0–15.0)

## 2013-11-09 NOTE — Patient Instructions (Signed)
Your procedure is scheduled on: 11/29/2013  Report to [facility] at  700  AM.  Call this number if you have problems the morning of surgery: [phone]   Do not eat food or drink liquids :After Midnight.      Take these medicines the morning of surgery with A SIP OF WATER: prozac, hydrocodone, losartan, prilosec. Take your Spiriva and Advair before you come.   Do not wear jewelry, make-up or nail polish.  Do not wear lotions, powders, or perfumes.   Do not shave 48 hours prior to surgery.  Do not bring valuables to the hospital.  Contacts, dentures or bridgework may not be worn into surgery.  Leave suitcase in the car. After surgery it may be brought to your room.  For patients admitted to the hospital, checkout time is 11:00 AM the day of discharge.   Patients discharged the day of surgery will not be allowed to drive home.  :     Please read over the following fact sheets that you were given: Coughing and Deep Breathing, Surgical Site Infection Prevention, Anesthesia Post-op Instructions and Care and Recovery After Surgery    Cataract A cataract is a clouding of the lens of the eye. When a lens becomes cloudy, vision is reduced based on the degree and nature of the clouding. Many cataracts reduce vision to some degree. Some cataracts make people more near-sighted as they develop. Other cataracts increase glare. Cataracts that are ignored and become worse can sometimes look white. The white color can be seen through the pupil. CAUSES   Aging. However, cataracts may occur at any age, even in newborns.   Certain drugs.   Trauma to the eye.   Certain diseases such as diabetes.   Specific eye diseases such as chronic inflammation inside the eye or a sudden attack of a rare form of glaucoma.   Inherited or acquired medical problems.  SYMPTOMS   Gradual, progressive drop in vision in the affected eye.   Severe, rapid visual loss. This most often happens when trauma is the cause.    DIAGNOSIS  To detect a cataract, an eye doctor examines the lens. Cataracts are best diagnosed with an exam of the eyes with the pupils enlarged (dilated) by drops.  TREATMENT  For an early cataract, vision may improve by using different eyeglasses or stronger lighting. If that does not help your vision, surgery is the only effective treatment. A cataract needs to be surgically removed when vision loss interferes with your everyday activities, such as driving, reading, or watching TV. A cataract may also have to be removed if it prevents examination or treatment of another eye problem. Surgery removes the cloudy lens and usually replaces it with a substitute lens (intraocular lens, IOL).  At a time when both you and your doctor agree, the cataract will be surgically removed. If you have cataracts in both eyes, only one is usually removed at a time. This allows the operated eye to heal and be out of danger from any possible problems after surgery (such as infection or poor wound healing). In rare cases, a cataract may be doing damage to your eye. In these cases, your caregiver may advise surgical removal right away. The vast majority of people who have cataract surgery have better vision afterward. HOME CARE INSTRUCTIONS  If you are not planning surgery, you may be asked to do the following:  Use different eyeglasses.   Use stronger or brighter lighting.   Ask  your eye doctor about reducing your medicine dose or changing medicines if it is thought that a medicine caused your cataract. Changing medicines does not make the cataract go away on its own.   Become familiar with your surroundings. Poor vision can lead to injury. Avoid bumping into things on the affected side. You are at a higher risk for tripping or falling.   Exercise extreme care when driving or operating machinery.   Wear sunglasses if you are sensitive to bright light or experiencing problems with glare.  SEEK IMMEDIATE MEDICAL CARE  IF:   You have a worsening or sudden vision loss.   You notice redness, swelling, or increasing pain in the eye.   You have a fever.  Document Released: 09/22/2005 Document Revised: 09/11/2011 Document Reviewed: 05/16/2011 [facility] Patient Information 2012 [place].PATIENT INSTRUCTIONS POST-ANESTHESIA  IMMEDIATELY FOLLOWING SURGERY:  Do not drive or operate machinery for the first twenty four hours after surgery.  Do not make any important decisions for twenty four hours after surgery or while taking narcotic pain medications or sedatives.  If you develop intractable nausea and vomiting or a severe headache please notify your doctor immediately.  FOLLOW-UP:  Please make an appointment with your surgeon as instructed. You do not need to follow up with anesthesia unless specifically instructed to do so.  WOUND CARE INSTRUCTIONS (if applicable):  Keep a dry clean dressing on the anesthesia/puncture wound site if there is drainage.  Once the wound has quit draining you may leave it open to air.  Generally you should leave the bandage intact for twenty four hours unless there is drainage.  If the epidural site drains for more than 36-48 hours please call the anesthesia department.  QUESTIONS?:  Please feel free to call your physician or the hospital operator if you have any questions, and they will be happy to assist you.

## 2013-11-22 ENCOUNTER — Encounter (HOSPITAL_COMMUNITY): Payer: Self-pay | Admitting: Pharmacy Technician

## 2013-11-23 ENCOUNTER — Other Ambulatory Visit (HOSPITAL_COMMUNITY): Payer: Medicare Other

## 2013-11-29 ENCOUNTER — Ambulatory Visit (HOSPITAL_COMMUNITY): Payer: Medicare Other | Admitting: Anesthesiology

## 2013-11-29 ENCOUNTER — Ambulatory Visit (HOSPITAL_COMMUNITY)
Admission: RE | Admit: 2013-11-29 | Discharge: 2013-11-29 | Disposition: A | Discharge status: Home / Self Care | Payer: Medicare Other | Source: Ambulatory Visit | Attending: Ophthalmology | Admitting: Ophthalmology

## 2013-11-29 ENCOUNTER — Encounter (HOSPITAL_COMMUNITY): Payer: Medicare Other | Admitting: Anesthesiology

## 2013-11-29 ENCOUNTER — Encounter (HOSPITAL_COMMUNITY): Payer: Self-pay | Admitting: *Deleted

## 2013-11-29 ENCOUNTER — Encounter (HOSPITAL_COMMUNITY)
Admission: RE | Disposition: A | Discharge status: Home / Self Care | Payer: Self-pay | Source: Ambulatory Visit | Attending: Ophthalmology

## 2013-11-29 DIAGNOSIS — Z7982 Long term (current) use of aspirin: Secondary | ICD-10-CM | POA: Insufficient documentation

## 2013-11-29 DIAGNOSIS — Z79899 Other long term (current) drug therapy: Secondary | ICD-10-CM | POA: Insufficient documentation

## 2013-11-29 DIAGNOSIS — J449 Chronic obstructive pulmonary disease, unspecified: Secondary | ICD-10-CM | POA: Insufficient documentation

## 2013-11-29 DIAGNOSIS — I10 Essential (primary) hypertension: Secondary | ICD-10-CM | POA: Insufficient documentation

## 2013-11-29 DIAGNOSIS — J4489 Other specified chronic obstructive pulmonary disease: Secondary | ICD-10-CM | POA: Insufficient documentation

## 2013-11-29 DIAGNOSIS — H2510 Age-related nuclear cataract, unspecified eye: Secondary | ICD-10-CM | POA: Insufficient documentation

## 2013-11-29 DIAGNOSIS — F411 Generalized anxiety disorder: Secondary | ICD-10-CM | POA: Insufficient documentation

## 2013-11-29 DIAGNOSIS — Z87891 Personal history of nicotine dependence: Secondary | ICD-10-CM | POA: Insufficient documentation

## 2013-11-29 HISTORY — PX: CATARACT EXTRACTION W/PHACO: SHX586

## 2013-11-29 SURGERY — PHACOEMULSIFICATION, CATARACT, WITH IOL INSERTION
Anesthesia: Monitor Anesthesia Care | Laterality: Left

## 2013-11-29 MED ORDER — MIDAZOLAM HCL 2 MG/2ML IJ SOLN
INTRAMUSCULAR | Status: AC
  Filled 2013-11-29: qty 2

## 2013-11-29 MED ORDER — TETRACAINE HCL 0.5 % OP SOLN
1.0000 [drp] | OPHTHALMIC | Status: AC
  Administered 2013-11-29 (×3): 1 [drp] via OPHTHALMIC

## 2013-11-29 MED ORDER — TETRACAINE 0.5 % OP SOLN OPTIME - NO CHARGE
OPHTHALMIC | Status: DC | PRN
  Administered 2013-11-29: 2 [drp] via OPHTHALMIC

## 2013-11-29 MED ORDER — MIDAZOLAM HCL 2 MG/2ML IJ SOLN
1.0000 mg | INTRAMUSCULAR | Status: DC | PRN
  Administered 2013-11-29: 2 mg via INTRAVENOUS

## 2013-11-29 MED ORDER — FENTANYL CITRATE 0.05 MG/ML IJ SOLN
INTRAMUSCULAR | Status: AC
  Filled 2013-11-29: qty 2

## 2013-11-29 MED ORDER — BSS IO SOLN
INTRAOCULAR | Status: DC | PRN
  Administered 2013-11-29: 15 mL via INTRAOCULAR

## 2013-11-29 MED ORDER — FENTANYL CITRATE 0.05 MG/ML IJ SOLN
25.0000 ug | INTRAMUSCULAR | Status: AC
  Administered 2013-11-29: 25 ug via INTRAVENOUS

## 2013-11-29 MED ORDER — LACTATED RINGERS IV SOLN
INTRAVENOUS | Status: DC
  Administered 2013-11-29: 08:00:00 via INTRAVENOUS

## 2013-11-29 MED ORDER — EPINEPHRINE HCL 1 MG/ML IJ SOLN
INTRAOCULAR | Status: DC | PRN
  Administered 2013-11-29: 08:00:00

## 2013-11-29 MED ORDER — ONDANSETRON HCL 4 MG/2ML IJ SOLN: 4.0000 mg | Freq: Once | INTRAMUSCULAR | Status: DC | PRN

## 2013-11-29 MED ORDER — FENTANYL CITRATE 0.05 MG/ML IJ SOLN: 25.0000 ug | INTRAMUSCULAR | Status: DC | PRN

## 2013-11-29 MED ORDER — PHENYLEPHRINE HCL 2.5 % OP SOLN
1.0000 [drp] | OPHTHALMIC | Status: AC
  Administered 2013-11-29 (×3): 1 [drp] via OPHTHALMIC

## 2013-11-29 MED ORDER — KETOROLAC TROMETHAMINE 0.5 % OP SOLN
1.0000 [drp] | OPHTHALMIC | Status: AC
  Administered 2013-11-29 (×2): 1 [drp] via OPHTHALMIC

## 2013-11-29 MED ORDER — CYCLOPENTOLATE-PHENYLEPHRINE 0.2-1 % OP SOLN
1.0000 [drp] | OPHTHALMIC | Status: AC
  Administered 2013-11-29 (×3): 1 [drp] via OPHTHALMIC

## 2013-11-29 MED ORDER — PROVISC 10 MG/ML IO SOLN
INTRAOCULAR | Status: DC | PRN
  Administered 2013-11-29: 0.85 mL via INTRAOCULAR

## 2013-11-29 MED ORDER — KETOROLAC TROMETHAMINE 0.5 % OP SOLN
1.0000 [drp] | Freq: Once | OPHTHALMIC | Status: AC
  Administered 2013-11-29: 1 [drp] via OPHTHALMIC

## 2013-11-29 SURGICAL SUPPLY — 27 items
CAPSULAR TENSION RING-AMO (OPHTHALMIC RELATED) IMPLANT
CLOTH BEACON ORANGE TIMEOUT ST (SAFETY) ×1 IMPLANT
EYE SHIELD UNIVERSAL CLEAR (GAUZE/BANDAGES/DRESSINGS) ×1 IMPLANT
GLOVE BIO SURGEON STRL SZ 6.5 (GLOVE) IMPLANT
GLOVE BIOGEL PI IND STRL 6.5 (GLOVE) IMPLANT
GLOVE BIOGEL PI IND STRL 7.0 (GLOVE) IMPLANT
GLOVE BIOGEL PI INDICATOR 6.5 (GLOVE) ×2
GLOVE BIOGEL PI INDICATOR 7.0 (GLOVE) ×1
GLOVE ECLIPSE 6.5 STRL STRAW (GLOVE) IMPLANT
GLOVE ECLIPSE 7.0 STRL STRAW (GLOVE) IMPLANT
GLOVE EXAM NITRILE LRG STRL (GLOVE) IMPLANT
GLOVE EXAM NITRILE MD LF STRL (GLOVE) IMPLANT
GLOVE SKINSENSE NS SZ6.5 (GLOVE)
GLOVE SKINSENSE STRL SZ6.5 (GLOVE) IMPLANT
HEALON 5 0.6 ML (INTRAOCULAR LENS) IMPLANT
KIT VITRECTOMY (OPHTHALMIC RELATED) IMPLANT
PAD ARMBOARD 7.5X6 YLW CONV (MISCELLANEOUS) ×1 IMPLANT
PROC W NO LENS (INTRAOCULAR LENS)
PROC W SPEC LENS (INTRAOCULAR LENS)
PROCESS W NO LENS (INTRAOCULAR LENS) IMPLANT
PROCESS W SPEC LENS (INTRAOCULAR LENS) IMPLANT
RING MALYGIN (MISCELLANEOUS) IMPLANT
SIGHTPATH CAT PROC W REG LENS (Ophthalmic Related) ×2 IMPLANT
SYR TB 1ML LL NO SAFETY (SYRINGE) ×1 IMPLANT
TAPE CLOTH SOFT 2X10 (GAUZE/BANDAGES/DRESSINGS) ×1 IMPLANT
VISCOELASTIC ADDITIONAL (OPHTHALMIC RELATED) IMPLANT
WATER STERILE IRR 250ML POUR (IV SOLUTION) ×1 IMPLANT

## 2013-11-29 NOTE — Transfer of Care (Signed)
Immediate Anesthesia Transfer of Care Note  Patient: Deanna ChampagneGeraldine G Hevia  Procedure(s) Performed: Procedure(s) with comments: CATARACT EXTRACTION PHACO AND INTRAOCULAR LENS PLACEMENT (IOC) (Left) - CDE 21.93  Patient Location: Short Stay  Anesthesia Type:MAC  Level of Consciousness: awake, alert , oriented and patient cooperative  Airway & Oxygen Therapy: Patient Spontanous Breathing  Post-op Assessment: Report given to PACU RN, Post -op Vital signs reviewed and stable and Patient moving all extremities  Post vital signs: Reviewed and stable  Complications: No apparent anesthesia complications

## 2013-11-29 NOTE — H&P (Signed)
The patient was re examined and there is no change in the patients condition since the original H and P. 

## 2013-11-29 NOTE — Anesthesia Postprocedure Evaluation (Signed)
  Anesthesia Post-op Note  Patient: Deanna Acosta  Procedure(s) Performed: Procedure(s) with comments: CATARACT EXTRACTION PHACO AND INTRAOCULAR LENS PLACEMENT (IOC) (Left) - CDE 21.93  Patient Location: Short Stay  Anesthesia Type:MAC  Level of Consciousness: awake, alert , oriented and patient cooperative  Airway and Oxygen Therapy: Patient Spontanous Breathing  Post-op Pain: none  Post-op Assessment: Post-op Vital signs reviewed, Patient's Cardiovascular Status Stable, Respiratory Function Stable, Patent Airway, No signs of Nausea or vomiting and Pain level controlled  Post-op Vital Signs: Reviewed and stable  Complications: No apparent anesthesia complications

## 2013-11-29 NOTE — Anesthesia Preprocedure Evaluation (Signed)
Anesthesia Evaluation  Patient identified by MRN, date of birth, ID band Patient awake    Reviewed: Allergy & Precautions, H&P , NPO status , Patient's Chart, lab work & pertinent test results  Airway Mallampati: II TM Distance: >3 FB Neck ROM: Full    Dental  (+) Teeth Intact, Partial Upper   Pulmonary shortness of breath, asthma , pneumonia -, resolved, COPDformer smoker,  breath sounds clear to auscultation        Cardiovascular hypertension, Pt. on medications Rhythm:Regular Rate:Normal     Neuro/Psych  Headaches, PSYCHIATRIC DISORDERS Anxiety Depression  Neuromuscular disease    GI/Hepatic GERD-  Controlled and Medicated,  Endo/Other    Renal/GU Renal InsufficiencyRenal disease     Musculoskeletal   Abdominal   Peds  Hematology  (+) Blood dyscrasia, anemia ,   Anesthesia Other Findings   Reproductive/Obstetrics                           Anesthesia Physical Anesthesia Plan  ASA: III  Anesthesia Plan: MAC   Post-op Pain Management:    Induction: Intravenous  Airway Management Planned: Nasal Cannula  Additional Equipment:   Intra-op Plan:   Post-operative Plan:   Informed Consent: I have reviewed the patients History and Physical, chart, labs and discussed the procedure including the risks, benefits and alternatives for the proposed anesthesia with the patient or authorized representative who has indicated his/her understanding and acceptance.     Plan Discussed with:   Anesthesia Plan Comments:         Anesthesia Quick Evaluation

## 2013-11-29 NOTE — Op Note (Signed)
Patient brought to the operating room and prepped and draped in the usual manner.  Lid speculum inserted in left eye.  Stab incision made at the twelve o'clock position.  Provisc instilled in the anterior chamber.   A 2.4 mm. Stab incision was made temporally.  An anterior capsulotomy was done with a bent 25 gauge needle.  The nucleus was hydrodissected.  The Phaco tip was inserted in the anterior chamber and the nucleus was emulsified.  CDE was 21.93.  The cortical material was then removed with the I and A tip.  Posterior capsule was the polished.  The anterior chamber was deepened with Provisc.  A 20.5 Diopter Rayner 570C IOL was then inserted in the capsular bag.  Provisc was then removed with the I and A tip.  The wound was then hydrated.  Patient sent to the Recovery Room in good condition with follow up in my office.  Preoperative Diagnosis:  Nuclear Cataract OS Postoperative Diagnosis:  Same Procedure name: Kelman Phacoemulsification OS with IOL

## 2013-11-29 NOTE — Discharge Instructions (Signed)
Loralie ChampagneGeraldine G Broyles  11/29/2013           [facility] Instructions [address]- [place] [id number] [address]      1. Avoid closing eyes tightly. One often closes the eye tightly when laughing, talking, sneezing, coughing or if they feel irritated. At these times, you should be careful not to close your eyes tightly.  2. Instill eye drops as instructed. To instill drops in your eye, open it, look up and have someone gently pull the lower lid down and instill a couple of drops inside the lower lid.  3. Do not touch upper lid.  4. Take Advil or Tylenol for pain.  5. You may use either eye for near work, such as reading or sewing and you may watch television.  6. You may have your hair done at the beauty parlor at any time.  7. Wear dark glasses with or without your own glasses if you are in bright light.  8. Call our office at [phone] or [phone] if you have sharp pain in your eye or unusual symptoms.  9. Do not be concerned because vision in the operative eye is not good. It will not be good, no matter how successful the operation, until you get a special lens for it. Your old glasses will not be suited to the new eye that was operated on and you will not be ready for a new lens for about a month.  10. [facility].    I have received a copy of the above instructions and will follow the instructions.  Follow up with Dr. Nile RiggsShapiro today at 1 PM

## 2013-11-30 ENCOUNTER — Encounter (HOSPITAL_COMMUNITY): Payer: Self-pay | Admitting: Ophthalmology

## 2013-12-07 ENCOUNTER — Encounter (HOSPITAL_COMMUNITY): Payer: Medicare Other | Attending: Ophthalmology

## 2013-12-07 ENCOUNTER — Encounter (HOSPITAL_COMMUNITY): Payer: Self-pay

## 2013-12-12 MED ORDER — KETOROLAC TROMETHAMINE 0.5 % OP SOLN
OPHTHALMIC | Status: AC
  Filled 2013-12-12: qty 5

## 2013-12-12 MED ORDER — CYCLOPENTOLATE-PHENYLEPHRINE OP SOLN OPTIME - NO CHARGE
OPHTHALMIC | Status: AC
  Filled 2013-12-12: qty 2

## 2013-12-12 MED ORDER — TETRACAINE HCL 0.5 % OP SOLN
OPHTHALMIC | Status: AC
  Filled 2013-12-12: qty 2

## 2013-12-12 MED ORDER — PHENYLEPHRINE HCL 2.5 % OP SOLN
OPHTHALMIC | Status: AC
  Filled 2013-12-12: qty 15

## 2013-12-13 ENCOUNTER — Encounter (HOSPITAL_COMMUNITY): Payer: Self-pay

## 2013-12-13 ENCOUNTER — Encounter (HOSPITAL_COMMUNITY)
Admission: RE | Disposition: A | Discharge status: Home / Self Care | Payer: Self-pay | Source: Ambulatory Visit | Attending: Ophthalmology

## 2013-12-13 ENCOUNTER — Encounter (HOSPITAL_COMMUNITY): Payer: Medicare Other | Admitting: Anesthesiology

## 2013-12-13 ENCOUNTER — Ambulatory Visit (HOSPITAL_COMMUNITY): Payer: Medicare Other | Admitting: Anesthesiology

## 2013-12-13 ENCOUNTER — Ambulatory Visit (HOSPITAL_COMMUNITY)
Admission: RE | Admit: 2013-12-13 | Discharge: 2013-12-13 | Disposition: A | Discharge status: Home / Self Care | Payer: Medicare Other | Source: Ambulatory Visit | Attending: Ophthalmology | Admitting: Ophthalmology

## 2013-12-13 DIAGNOSIS — F411 Generalized anxiety disorder: Secondary | ICD-10-CM | POA: Insufficient documentation

## 2013-12-13 DIAGNOSIS — H2510 Age-related nuclear cataract, unspecified eye: Secondary | ICD-10-CM | POA: Insufficient documentation

## 2013-12-13 DIAGNOSIS — Z79899 Other long term (current) drug therapy: Secondary | ICD-10-CM | POA: Insufficient documentation

## 2013-12-13 DIAGNOSIS — I10 Essential (primary) hypertension: Secondary | ICD-10-CM | POA: Insufficient documentation

## 2013-12-13 DIAGNOSIS — Z87891 Personal history of nicotine dependence: Secondary | ICD-10-CM | POA: Insufficient documentation

## 2013-12-13 DIAGNOSIS — J449 Chronic obstructive pulmonary disease, unspecified: Secondary | ICD-10-CM | POA: Insufficient documentation

## 2013-12-13 DIAGNOSIS — J4489 Other specified chronic obstructive pulmonary disease: Secondary | ICD-10-CM | POA: Insufficient documentation

## 2013-12-13 HISTORY — PX: CATARACT EXTRACTION W/PHACO: SHX586

## 2013-12-13 SURGERY — PHACOEMULSIFICATION, CATARACT, WITH IOL INSERTION
Anesthesia: Monitor Anesthesia Care | Site: Eye | Laterality: Right

## 2013-12-13 MED ORDER — KETOROLAC TROMETHAMINE 0.5 % OP SOLN
1.0000 [drp] | OPHTHALMIC | Status: AC
  Administered 2013-12-13 (×3): 1 [drp] via OPHTHALMIC

## 2013-12-13 MED ORDER — MIDAZOLAM HCL 2 MG/2ML IJ SOLN
1.0000 mg | INTRAMUSCULAR | Status: DC | PRN
  Administered 2013-12-13: 2 mg via INTRAVENOUS

## 2013-12-13 MED ORDER — LACTATED RINGERS IV SOLN
INTRAVENOUS | Status: DC
  Administered 2013-12-13: 08:00:00 via INTRAVENOUS

## 2013-12-13 MED ORDER — BSS IO SOLN
INTRAOCULAR | Status: DC | PRN
  Administered 2013-12-13: 15 mL via INTRAOCULAR

## 2013-12-13 MED ORDER — PROVISC 10 MG/ML IO SOLN
INTRAOCULAR | Status: DC | PRN
  Administered 2013-12-13: 0.85 mL via INTRAOCULAR

## 2013-12-13 MED ORDER — TETRACAINE HCL 0.5 % OP SOLN
1.0000 [drp] | OPHTHALMIC | Status: AC
  Administered 2013-12-13 (×3): 1 [drp] via OPHTHALMIC

## 2013-12-13 MED ORDER — CYCLOPENTOLATE-PHENYLEPHRINE 0.2-1 % OP SOLN
1.0000 [drp] | OPHTHALMIC | Status: AC
  Administered 2013-12-13 (×3): 1 [drp] via OPHTHALMIC

## 2013-12-13 MED ORDER — FENTANYL CITRATE 0.05 MG/ML IJ SOLN
25.0000 ug | INTRAMUSCULAR | Status: AC
Start: 2013-12-13 — End: 2013-12-13
  Administered 2013-12-13 (×2): 25 ug via INTRAVENOUS

## 2013-12-13 MED ORDER — EPINEPHRINE HCL 1 MG/ML IJ SOLN
INTRAMUSCULAR | Status: AC
  Filled 2013-12-13: qty 1

## 2013-12-13 MED ORDER — TETRACAINE 0.5 % OP SOLN OPTIME - NO CHARGE
OPHTHALMIC | Status: DC | PRN
  Administered 2013-12-13: 2 [drp] via OPHTHALMIC

## 2013-12-13 MED ORDER — MIDAZOLAM HCL 2 MG/2ML IJ SOLN
INTRAMUSCULAR | Status: AC
  Filled 2013-12-13: qty 2

## 2013-12-13 MED ORDER — FENTANYL CITRATE 0.05 MG/ML IJ SOLN
INTRAMUSCULAR | Status: AC
  Filled 2013-12-13: qty 2

## 2013-12-13 MED ORDER — PHENYLEPHRINE HCL 2.5 % OP SOLN
1.0000 [drp] | OPHTHALMIC | Status: AC
  Administered 2013-12-13 (×3): 1 [drp] via OPHTHALMIC

## 2013-12-13 MED ORDER — EPINEPHRINE HCL 1 MG/ML IJ SOLN
INTRAOCULAR | Status: DC | PRN
  Administered 2013-12-13: 08:00:00

## 2013-12-13 SURGICAL SUPPLY — 25 items
CAPSULAR TENSION RING-AMO (OPHTHALMIC RELATED) IMPLANT
CLOTH BEACON ORANGE TIMEOUT ST (SAFETY) ×1 IMPLANT
EYE SHIELD UNIVERSAL CLEAR (GAUZE/BANDAGES/DRESSINGS) ×1 IMPLANT
GLOVE BIO SURGEON STRL SZ 6.5 (GLOVE) ×1 IMPLANT
GLOVE BIOGEL PI IND STRL 7.0 (GLOVE) IMPLANT
GLOVE BIOGEL PI INDICATOR 7.0 (GLOVE) ×1
GLOVE ECLIPSE 6.5 STRL STRAW (GLOVE) IMPLANT
GLOVE ECLIPSE 7.0 STRL STRAW (GLOVE) IMPLANT
GLOVE EXAM NITRILE LRG STRL (GLOVE) IMPLANT
GLOVE EXAM NITRILE MD LF STRL (GLOVE) IMPLANT
GLOVE SKINSENSE NS SZ6.5 (GLOVE)
GLOVE SKINSENSE STRL SZ6.5 (GLOVE) IMPLANT
HEALON 5 0.6 ML (INTRAOCULAR LENS) IMPLANT
KIT VITRECTOMY (OPHTHALMIC RELATED) IMPLANT
PAD ARMBOARD 7.5X6 YLW CONV (MISCELLANEOUS) ×1 IMPLANT
PROC W NO LENS (INTRAOCULAR LENS)
PROC W SPEC LENS (INTRAOCULAR LENS)
PROCESS W NO LENS (INTRAOCULAR LENS) IMPLANT
PROCESS W SPEC LENS (INTRAOCULAR LENS) IMPLANT
RING MALYGIN (MISCELLANEOUS) IMPLANT
SIGHTPATH CAT PROC W REG LENS (Ophthalmic Related) ×2 IMPLANT
TAPE SURG TRANSPORE 1 IN (GAUZE/BANDAGES/DRESSINGS) IMPLANT
TAPE SURGICAL TRANSPORE 1 IN (GAUZE/BANDAGES/DRESSINGS) ×1
VISCOELASTIC ADDITIONAL (OPHTHALMIC RELATED) IMPLANT
WATER STERILE IRR 250ML POUR (IV SOLUTION) ×1 IMPLANT

## 2013-12-13 NOTE — Anesthesia Postprocedure Evaluation (Signed)
  Anesthesia Post-op Note  Patient: Deanna Acosta  Procedure(s) Performed: Procedure(s) (LRB): CATARACT EXTRACTION PHACO AND INTRAOCULAR LENS PLACEMENT (IOC) (Right)  Patient Location:  Short Stay  Anesthesia Type: MAC  Level of Consciousness: awake  Airway and Oxygen Therapy: Patient Spontanous Breathing  Post-op Pain: none  Post-op Assessment: Post-op Vital signs reviewed, Patient's Cardiovascular Status Stable, Respiratory Function Stable, Patent Airway, No signs of Nausea or vomiting and Pain level controlled  Post-op Vital Signs: Reviewed and stable  Complications: No apparent anesthesia complications

## 2013-12-13 NOTE — Op Note (Signed)
Patient brought to the operating room and prepped and draped in the usual manner.  Lid speculum inserted in right eye.  Stab incision made at the twelve o'clock position.  Provisc instilled in the anterior chamber.   A 2.4 mm. Stab incision was made temporally.  An anterior capsulotomy was done with a bent 25 gauge needle.  The nucleus was hydrodissected.  The Phaco tip was inserted in the anterior chamber and the nucleus was emulsified.  CDE was 5.40.  The cortical material was then removed with the I and A tip.  Posterior capsule was the polished.  The anterior chamber was deepened with Provisc.  A 20.5 Diopter Rayner 570C IOL was then inserted in the capsular bag.  Provisc was then removed with the I and A tip.  The wound was then hydrated.  Patient sent to the Recovery Room in good condition with follow up in my office.  Preoperative Diagnosis:  Nuclear Cataract OD Postoperative Diagnosis:  Same Procedure name: Kelman Phacoemulsification OD with IOL

## 2013-12-13 NOTE — Anesthesia Preprocedure Evaluation (Signed)
Anesthesia Evaluation  Patient identified by MRN, date of birth, ID band Patient awake    Reviewed: Allergy & Precautions, H&P , NPO status , Patient's Chart, lab work & pertinent test results  Airway Mallampati: II TM Distance: >3 FB Neck ROM: Full    Dental  (+) Teeth Intact, Partial Upper   Pulmonary shortness of breath, asthma , pneumonia -, resolved, COPDformer smoker,  breath sounds clear to auscultation        Cardiovascular hypertension, Pt. on medications Rhythm:Regular Rate:Normal     Neuro/Psych  Headaches, PSYCHIATRIC DISORDERS Anxiety Depression  Neuromuscular disease    GI/Hepatic GERD-  Controlled and Medicated,  Endo/Other    Renal/GU Renal InsufficiencyRenal disease     Musculoskeletal   Abdominal   Peds  Hematology  (+) Blood dyscrasia, anemia ,   Anesthesia Other Findings   Reproductive/Obstetrics                           Anesthesia Physical Anesthesia Plan  ASA: III  Anesthesia Plan: MAC   Post-op Pain Management:    Induction: Intravenous  Airway Management Planned: Nasal Cannula  Additional Equipment:   Intra-op Plan:   Post-operative Plan:   Informed Consent: I have reviewed the patients History and Physical, chart, labs and discussed the procedure including the risks, benefits and alternatives for the proposed anesthesia with the patient or authorized representative who has indicated his/her understanding and acceptance.     Plan Discussed with:   Anesthesia Plan Comments:         Anesthesia Quick Evaluation  

## 2013-12-13 NOTE — Anesthesia Procedure Notes (Signed)
Procedure Name: MAC Date/Time: 12/13/2013 8:00 AM Performed by: Franco NonesYATES, Deanna Bralley S Pre-anesthesia Checklist: Patient identified, Emergency Drugs available, Suction available, Timeout performed and Patient being monitored Patient Re-evaluated:Patient Re-evaluated prior to inductionOxygen Delivery Method: Nasal Cannula

## 2013-12-13 NOTE — H&P (Signed)
The patient was re examined and there is no change in the patients condition since the original H and P. 

## 2013-12-13 NOTE — Discharge Instructions (Signed)
Deanna ChampagneGeraldine G Acosta  12/13/2013           [facility] Instructions [address]- [place] [id number] [address]      1. Avoid closing eyes tightly. One often closes the eye tightly when laughing, talking, sneezing, coughing or if they feel irritated. At these times, you should be careful not to close your eyes tightly.  2. Instill eye drops as instructed. To instill drops in your eye, open it, look up and have someone gently pull the lower lid down and instill a couple of drops inside the lower lid.  3. Do not touch upper lid.  4. Take Advil or Tylenol for pain.  5. You may use either eye for near work, such as reading or sewing and you may watch television.  6. You may have your hair done at the beauty parlor at any time.  7. Wear dark glasses with or without your own glasses if you are in bright light.  8. Call our office at [phone] or [phone] if you have sharp pain in your eye or unusual symptoms.  9. Do not be concerned because vision in the operative eye is not good. It will not be good, no matter how successful the operation, until you get a special lens for it. Your old glasses will not be suited to the new eye that was operated on and you will not be ready for a new lens for about a month.  10. [facility] at [facility].    I have received a copy of the above instructions and will follow them.    PATIENT INSTRUCTIONS POST-ANESTHESIA  IMMEDIATELY FOLLOWING SURGERY:  Do not drive or operate machinery for the first twenty four hours after surgery.  Do not make any important decisions for twenty four hours after surgery or while taking narcotic pain medications or sedatives.  If you develop intractable nausea and vomiting or a severe headache please notify your doctor immediately.  FOLLOW-UP:  Please make an appointment with your surgeon as instructed. You do not need to follow up with anesthesia unless specifically instructed to do  so.  WOUND CARE INSTRUCTIONS (if applicable):  Keep a dry clean dressing on the anesthesia/puncture wound site if there is drainage.  Once the wound has quit draining you may leave it open to air.  Generally you should leave the bandage intact for twenty four hours unless there is drainage.  If the epidural site drains for more than 36-48 hours please call the anesthesia department.  QUESTIONS?:  Please feel free to call your physician or the hospital operator if you have any questions, and they will be happy to assist you.

## 2013-12-13 NOTE — Transfer of Care (Signed)
Immediate Anesthesia Transfer of Care Note  Patient: Deanna Acosta  Procedure(s) Performed: Procedure(s) (LRB): CATARACT EXTRACTION PHACO AND INTRAOCULAR LENS PLACEMENT (IOC) (Right)  Patient Location: Shortstay  Anesthesia Type: MAC  Level of Consciousness: awake  Airway & Oxygen Therapy: Patient Spontanous Breathing   Post-op Assessment: Report given to PACU RN, Post -op Vital signs reviewed and stable and Patient moving all extremities  Post vital signs: Reviewed and stable  Complications: No apparent anesthesia complications

## 2013-12-14 ENCOUNTER — Encounter (HOSPITAL_COMMUNITY): Payer: Self-pay | Admitting: Ophthalmology

## 2014-06-23 ENCOUNTER — Encounter: Payer: Self-pay | Admitting: Internal Medicine

## 2014-08-18 ENCOUNTER — Emergency Department (HOSPITAL_COMMUNITY)
Admission: EM | Admit: 2014-08-18 | Discharge: 2014-08-18 | Disposition: A | Discharge status: Home / Self Care | Payer: Medicare Other | Attending: Emergency Medicine | Admitting: Emergency Medicine

## 2014-08-18 ENCOUNTER — Encounter (HOSPITAL_COMMUNITY): Payer: Self-pay | Admitting: Emergency Medicine

## 2014-08-18 DIAGNOSIS — Y9389 Activity, other specified: Secondary | ICD-10-CM | POA: Insufficient documentation

## 2014-08-18 DIAGNOSIS — F419 Anxiety disorder, unspecified: Secondary | ICD-10-CM | POA: Diagnosis not present

## 2014-08-18 DIAGNOSIS — I10 Essential (primary) hypertension: Secondary | ICD-10-CM | POA: Diagnosis not present

## 2014-08-18 DIAGNOSIS — S80211A Abrasion, right knee, initial encounter: Secondary | ICD-10-CM

## 2014-08-18 DIAGNOSIS — Z880 Allergy status to penicillin: Secondary | ICD-10-CM | POA: Diagnosis not present

## 2014-08-18 DIAGNOSIS — E785 Hyperlipidemia, unspecified: Secondary | ICD-10-CM | POA: Diagnosis not present

## 2014-08-18 DIAGNOSIS — Z862 Personal history of diseases of the blood and blood-forming organs and certain disorders involving the immune mechanism: Secondary | ICD-10-CM | POA: Diagnosis not present

## 2014-08-18 DIAGNOSIS — K219 Gastro-esophageal reflux disease without esophagitis: Secondary | ICD-10-CM | POA: Insufficient documentation

## 2014-08-18 DIAGNOSIS — S51012A Laceration without foreign body of left elbow, initial encounter: Secondary | ICD-10-CM | POA: Insufficient documentation

## 2014-08-18 DIAGNOSIS — Z7982 Long term (current) use of aspirin: Secondary | ICD-10-CM | POA: Diagnosis not present

## 2014-08-18 DIAGNOSIS — Z872 Personal history of diseases of the skin and subcutaneous tissue: Secondary | ICD-10-CM | POA: Insufficient documentation

## 2014-08-18 DIAGNOSIS — Z8701 Personal history of pneumonia (recurrent): Secondary | ICD-10-CM | POA: Diagnosis not present

## 2014-08-18 DIAGNOSIS — W01198A Fall on same level from slipping, tripping and stumbling with subsequent striking against other object, initial encounter: Secondary | ICD-10-CM | POA: Insufficient documentation

## 2014-08-18 DIAGNOSIS — Z79899 Other long term (current) drug therapy: Secondary | ICD-10-CM | POA: Diagnosis not present

## 2014-08-18 DIAGNOSIS — J449 Chronic obstructive pulmonary disease, unspecified: Secondary | ICD-10-CM | POA: Insufficient documentation

## 2014-08-18 DIAGNOSIS — Y998 Other external cause status: Secondary | ICD-10-CM | POA: Insufficient documentation

## 2014-08-18 DIAGNOSIS — Y9289 Other specified places as the place of occurrence of the external cause: Secondary | ICD-10-CM | POA: Insufficient documentation

## 2014-08-18 DIAGNOSIS — Z8669 Personal history of other diseases of the nervous system and sense organs: Secondary | ICD-10-CM | POA: Insufficient documentation

## 2014-08-18 DIAGNOSIS — Z87891 Personal history of nicotine dependence: Secondary | ICD-10-CM | POA: Diagnosis not present

## 2014-08-18 DIAGNOSIS — Z7951 Long term (current) use of inhaled steroids: Secondary | ICD-10-CM | POA: Insufficient documentation

## 2014-08-18 DIAGNOSIS — S8991XA Unspecified injury of right lower leg, initial encounter: Secondary | ICD-10-CM | POA: Diagnosis present

## 2014-08-18 DIAGNOSIS — F329 Major depressive disorder, single episode, unspecified: Secondary | ICD-10-CM | POA: Insufficient documentation

## 2014-08-18 DIAGNOSIS — S51011A Laceration without foreign body of right elbow, initial encounter: Secondary | ICD-10-CM

## 2014-08-18 MED ORDER — LIDOCAINE HCL 2 % EX GEL
CUTANEOUS | Status: AC
  Filled 2014-08-18: qty 30

## 2014-08-18 MED ORDER — LIDOCAINE VISCOUS 2 % MT SOLN
OROMUCOSAL | Status: AC
  Filled 2014-08-18: qty 15

## 2014-08-18 MED ORDER — LIDOCAINE HCL 2 % EX GEL: 1.0000 "application " | Freq: Once | CUTANEOUS | Status: DC

## 2014-08-18 NOTE — ED Notes (Signed)
Patient with no complaints at this time. Respirations even and unlabored. Skin warm/dry. Discharge instructions reviewed with patient at this time. Patient given opportunity to voice concerns/ask questions. Patient discharged at this time and left Emergency Department with steady gait.   

## 2014-08-18 NOTE — ED Notes (Addendum)
Pt reports slipped at home and reports "Scraped" right arm and hit right knee. No active bleeding noted. Large abrasion noted to right forearm/right elbow. nad noted. Pt denies being on any blood thinners. Pt reports does not want right knee looked at, "its fine."

## 2014-08-18 NOTE — ED Notes (Signed)
Dressing applied to right forearm. telfa secured in place with stretch bandage. Stretch bandage secured with medical tape. nad noted. Pt tolerated well. Neosporin applied to site prior to arrival.

## 2014-08-18 NOTE — Discharge Instructions (Signed)
Skin Tear Care °A skin tear is when the top layer of skin peels off. To repair the skin, your doctor may use:  °· Tape. °· Skin adhesive strips. °HOME CARE °· Change bandages (dressings) once a day or as told by your doctor. °· Gently clean the area with salt (saline) solution or with a mild soap and water. °· Do not rub the injured skin dry. Let the area air dry. °· Put petroleum jelly or antibiotic cream on the tear. Do not allow a scab to form. °· If the bandage sticks, moisten it with warm soapy water and remove it. °· Protect the injured skin until it has healed. °· Only take medicine as told by your doctor. °· Take showers or baths using warm soapy water. Apply a new bandage after the shower or bath. °· Keep all doctor visits as told. °GET HELP RIGHT AWAY IF:  °· You have redness, puffiness (swelling), or more pain in the tear. °· You have a yellowish-white fluid (pus) coming from the tear. °· You have chills. °· You have a red streak that goes away from the tear. °· You have a bad smell coming from the tear or bandage. °· You have a fever or lasting symptoms for more than 2-3 days. °· You have a fever and your symptoms suddenly get worse. °MAKE SURE YOU:  °· Understand these instructions. °· Will watch this condition. °· Will get help right away if you are not doing well or get worse. °Document Released: 07/01/2008 Document Revised: 06/16/2012 Document Reviewed: 04/05/2012 °ExitCare® Patient Information ©2015 [employer]. This information is not intended to replace advice given to you by your health care provider. Make sure you discuss any questions you have with your health care provider. ° °Sterile Tape Wound Care °Some cuts and wounds can be closed using sterile tape, also called skin adhesive strips. Skin adhesive strips can be used for shallow (superficial) and simple cuts, wounds, lacerations, and surgical incisions. These strips act in place of stitches to hold the edges of the wound together,  allowing for faster healing. Unlike stitches, the adhesive strips do not require needles or anesthetic medicine for placement. The strips will wear off naturally as the wound is healing. It is important to take proper care of your wound at home while it heals.  °HOME CARE INSTRUCTIONS °· Try to keep the area around your wound clean and dry. Do not allow the adhesive strips to get wet for the first 12 hours.   °· Do not use any soaps or ointments on the wound for the first 12 hours.   °· If a bandage (dressing) has been applied, follow your health care provider's instructions for how often to change the dressing. Keep the dressing dry if one has been applied.   °· Do not remove the adhesive strips. They will fall off on their own. If they do not, you may remove them gently after 10 days. You should gently wet the strips before removing them. For example, this can be done in the shower. °· Do not scratch, pick, or rub the wound area.   °· Protect the wound from further injury until it is healed.   °· Protect the wound from sun and tanning bed exposure while it is healing and for several weeks after healing.   °· Only take over-the-counter or prescription medicines as directed by your health care provider.   °· Keep all follow-up appointments as directed by your health care provider.   °SEEK MEDICAL CARE IF: °Your adhesive strips become   wet or soaked with blood before the wound has healed. The tape will need to be replaced.  °SEEK IMMEDIATE MEDICAL CARE IF: °· You have increasing pain in the wound.   °· You develop a rash after the strips are applied. °· Your wound becomes red, swollen, hot, or tender.   °· You have a red streak that goes away from the wound.   °· You have pus coming from the wound.   °· You have increased bleeding from the wound. °· You notice a bad smell coming from the wound.   °· Your wound breaks open. °MAKE SURE YOU: °· Understand these instructions. °· Will watch your condition. °· Will get help  right away if you are not doing well or get worse. °Document Released: 10/30/2004 Document Revised: 07/13/2013 Document Reviewed: 04/13/2013 °ExitCare® Patient Information ©2015 [employer]. This information is not intended to replace advice given to you by your health care provider. Make sure you discuss any questions you have with your health care provider. ° °

## 2014-08-22 NOTE — ED Provider Notes (Signed)
CSN: [id number]     Arrival date & time 08/18/14  1525 History   First MD Initiated Contact with Patient 08/18/14 1609     Chief Complaint  Patient presents with  . Abrasion     (Consider location/radiation/quality/duration/timing/severity/associated sxs/prior Treatment) HPI   Deanna Acosta is a 78 y.o. female who presents to the Emergency Department complaining of skin tears to her right elbow and knee that occurred after she tripped and fell against a table.  She states that the edge of the table caused a "scrape" and her knee struck the floor as she fell.  She denies other injuries, pain with movement of the arm or knee, numbness swelling or significant bleeding.  She states she tried to clean the areas with water and applied neosporin and a bandage.    Past Medical History  Diagnosis Date  . Allergy     seasonal  . Depression   . Anxiety   . Arthritis   . Asthma     bronchitis  . GERD (gastroesophageal reflux disease)   . Hyperlipidemia   . Hypertension   . Osteoporosis   . Ulcer     H-pylorie treated  . RSD (reflex sympathetic dystrophy)   . COPD (chronic obstructive pulmonary disease)   . Pneumonia     2013  . Blood clot in vein     LEG    YEARS AGO  . Blood dyscrasia     ELEVATED WBC  HX  . Shortness of breath    Past Surgical History  Procedure Laterality Date  . Rotator cuff repair  2009  . Cholecystectomy  2000  . Orif distal radius fracture  2002    plate  . Total abdominal hysterectomy  1980    fibroids  . Knee arthroscopy      right  . Elbow arthroscopy      tendonitis/right  . Tonsillectomy and adenoidectomy    . Ganglion cyst excision      left wrist  . Knee ligament reconstruction      right/water ski accident  . Total knee arthroplasty Left 06/20/2013    Procedure: LEFT TOTAL KNEE ARTHROPLASTY;  Surgeon: Verlee RossettiSteven R Norris, MD;  [facility];  Service: Orthopedics;  Laterality: Left;  . Cataract extraction w/phaco Left 11/29/2013   Procedure: CATARACT EXTRACTION PHACO AND INTRAOCULAR LENS PLACEMENT (IOC);  Surgeon: Loraine LericheMark T. Nile RiggsShapiro, MD;  Location: [facility];  Service: Ophthalmology;  Laterality: Left;  CDE 21.93  . Cataract extraction w/phaco Right 12/13/2013    Procedure: CATARACT EXTRACTION PHACO AND INTRAOCULAR LENS PLACEMENT (IOC);  Surgeon: Loraine LericheMark T. Nile RiggsShapiro, MD;  Location: [facility] ORS;  Service: Ophthalmology;  Laterality: Right;  CDE:5.40   Family History  Problem Relation Age of Onset  . Ulcers Father     bleeding/gastric  . Colon cancer Neg Hx   . Esophageal cancer Neg Hx   . Stomach cancer Neg Hx    History  Substance Use Topics  . Smoking status: Former Smoker -- 2.00 packs/day for 54 years    Types: Cigarettes    Quit date: 02/25/1999  . Smokeless tobacco: Never Used  . Alcohol Use: Yes     Comment: only on anniversary    OB History    No data available     Review of Systems  Constitutional: Negative for fever and chills.  Gastrointestinal: Negative for nausea, vomiting and abdominal pain.  Musculoskeletal: Negative for back pain, joint swelling, arthralgias, neck pain and neck stiffness.  Skin:  Positive for wound.       Skin tears to her right elbow and knee  Neurological: Negative for dizziness, syncope, weakness, numbness and headaches.  Hematological: Does not bruise/bleed easily.  All other systems reviewed and are negative.     Allergies  Penicillins and Levofloxacin  Home Medications   Prior to Admission medications   Medication Sig Start Date End Date Taking? Authorizing Provider  aspirin EC 81 MG tablet Take 81 mg by mouth daily.    Historical Provider, MD  Cholecalciferol (VITAMIN D) 2000 UNITS CAPS Take 1 capsule by mouth daily.    Historical Provider, MD  diltiazem (CARDIZEM CD) 120 MG 24 hr capsule  08/01/14   Historical Provider, MD  diltiazem (DILACOR XR) 240 MG 24 hr capsule Take 240 mg by mouth daily.    Historical Provider, MD  FLUoxetine (PROZAC) 40 MG capsule Take 40 mg by  mouth daily before breakfast.     Historical Provider, MD  fluticasone (FLONASE) 50 MCG/ACT nasal spray  06/13/14   Historical Provider, MD  Fluticasone-Salmeterol (ADVAIR) 250-50 MCG/DOSE AEPB Inhale 1 puff into the lungs every 12 (twelve) hours.    Historical Provider, MD  losartan (COZAAR) 100 MG tablet Take 100 mg by mouth every evening.     Historical Provider, MD  lovastatin (MEVACOR) 40 MG tablet Take 40 mg by mouth every evening.    Historical Provider, MD  omeprazole (PRILOSEC) 40 MG capsule Take 40 mg by mouth daily.     Historical Provider, MD   BP 166/93 mmHg  Pulse 78  Temp(Src) 97.7 F (36.5 C) (Oral)  Ht 5\' 5"  (1.651 m)  Wt 197 lb (89.359 kg)  BMI 32.78 kg/m2  SpO2 96% Physical Exam  Constitutional: She is oriented to person, place, and time. She appears well-developed and well-nourished. No distress.  HENT:  Head: Normocephalic and atraumatic.  Eyes: EOM are normal. Pupils are equal, round, and reactive to light.  Neck: Normal range of motion. Neck supple.  Cardiovascular: Normal rate, regular rhythm, normal heart sounds and intact distal pulses.   No murmur heard. Pulmonary/Chest: Effort normal and breath sounds normal. No respiratory distress. She exhibits no tenderness.  Musculoskeletal: Normal range of motion. She exhibits no edema or tenderness.  Neurological: She is alert and oriented to person, place, and time. She exhibits normal muscle tone. Coordination normal. right radial pulse and distal sensation intact.   Skin: Skin is warm. 5 cm superficial skin tear to the lateral right elbow.  No bleeding.  No obvious injury to the deep structures. 2 cm skin tear of the right knee.  No edema, bleeding controlled. Nursing note and vitals reviewed.   ED Course  Procedures (including critical care time) Labs Review Labs Reviewed - No data to display  Imaging Review No results found.   EKG Interpretation None       LACERATION REPAIR Performed by: Safaa Stingley  L. Authorized by: Maxwell Caul Consent: Verbal consent obtained. Risks and benefits: risks, benefits and alternatives were discussed Consent given by: patient Patient identity confirmed: provided demographic data Prepped and Draped in normal sterile fashion Wound explored  Laceration Location: right elbow Laceration Length: 5 cm  No Foreign Bodies seen or palpated  Anesthesia: topical application Local anesthetic:  Lidocaine jelly 2% w/o epinephrine  Anesthetic total: 4 ml  Irrigation method: syringe Amount of cleaning: standard  Skin closure: steri-strips   Technique: topical application, skin tear .  Edges of the skin were approximated and steri-strips applied  Patient  tolerance: Patient tolerated the procedure well with no immediate complications.  MDM   Final diagnoses:  Skin tear of elbow without complication, right, initial encounter  Abrasion, knee, right, initial encounter     Skin tears to the right elbow and knee.  Pt is ambulatory and has full ROM of the knee and elbow.  NV intact.  Td is UTD.  Wound care instructions given, pt agrees to return here for any signs of infection   Samiel Peel L. Trisha Mangleriplett, PA-C 08/22/14 1606  Lyanne CoKevin M Campos, MD 08/23/14 [phone]

## 2014-09-26 ENCOUNTER — Other Ambulatory Visit: Payer: Self-pay

## 2014-09-26 DIAGNOSIS — Z1231 Encounter for screening mammogram for malignant neoplasm of breast: Secondary | ICD-10-CM

## 2014-10-25 ENCOUNTER — Ambulatory Visit
Admission: RE | Admit: 2014-10-25 | Discharge: 2014-10-25 | Disposition: A | Discharge status: Home / Self Care | Payer: Medicare Other | Source: Ambulatory Visit

## 2014-10-25 DIAGNOSIS — Z1231 Encounter for screening mammogram for malignant neoplasm of breast: Secondary | ICD-10-CM

## 2014-12-29 ENCOUNTER — Encounter (HOSPITAL_COMMUNITY): Payer: Self-pay | Admitting: *Deleted

## 2014-12-29 ENCOUNTER — Emergency Department (HOSPITAL_COMMUNITY)
Admission: EM | Admit: 2014-12-29 | Discharge: 2014-12-29 | Disposition: A | Discharge status: Home / Self Care | Payer: Medicare Other | Attending: Emergency Medicine | Admitting: Emergency Medicine

## 2014-12-29 DIAGNOSIS — Z8659 Personal history of other mental and behavioral disorders: Secondary | ICD-10-CM | POA: Diagnosis not present

## 2014-12-29 DIAGNOSIS — K121 Other forms of stomatitis: Secondary | ICD-10-CM | POA: Diagnosis not present

## 2014-12-29 DIAGNOSIS — K1230 Oral mucositis (ulcerative), unspecified: Secondary | ICD-10-CM | POA: Insufficient documentation

## 2014-12-29 DIAGNOSIS — M1990 Unspecified osteoarthritis, unspecified site: Secondary | ICD-10-CM | POA: Diagnosis not present

## 2014-12-29 DIAGNOSIS — I10 Essential (primary) hypertension: Secondary | ICD-10-CM | POA: Diagnosis not present

## 2014-12-29 DIAGNOSIS — K1379 Other lesions of oral mucosa: Secondary | ICD-10-CM | POA: Diagnosis present

## 2014-12-29 DIAGNOSIS — Z7952 Long term (current) use of systemic steroids: Secondary | ICD-10-CM | POA: Diagnosis not present

## 2014-12-29 DIAGNOSIS — Z862 Personal history of diseases of the blood and blood-forming organs and certain disorders involving the immune mechanism: Secondary | ICD-10-CM | POA: Insufficient documentation

## 2014-12-29 DIAGNOSIS — Z7982 Long term (current) use of aspirin: Secondary | ICD-10-CM | POA: Insufficient documentation

## 2014-12-29 DIAGNOSIS — Z87891 Personal history of nicotine dependence: Secondary | ICD-10-CM | POA: Insufficient documentation

## 2014-12-29 DIAGNOSIS — Z8701 Personal history of pneumonia (recurrent): Secondary | ICD-10-CM | POA: Insufficient documentation

## 2014-12-29 DIAGNOSIS — E785 Hyperlipidemia, unspecified: Secondary | ICD-10-CM | POA: Insufficient documentation

## 2014-12-29 DIAGNOSIS — Z880 Allergy status to penicillin: Secondary | ICD-10-CM | POA: Diagnosis not present

## 2014-12-29 DIAGNOSIS — Z79899 Other long term (current) drug therapy: Secondary | ICD-10-CM | POA: Insufficient documentation

## 2014-12-29 DIAGNOSIS — J449 Chronic obstructive pulmonary disease, unspecified: Secondary | ICD-10-CM | POA: Diagnosis not present

## 2014-12-29 DIAGNOSIS — K219 Gastro-esophageal reflux disease without esophagitis: Secondary | ICD-10-CM | POA: Insufficient documentation

## 2014-12-29 MED ORDER — MAGIC MOUTHWASH W/LIDOCAINE: 5.0000 mL | Freq: Four times a day (QID) | ORAL | Status: DC | PRN

## 2014-12-29 MED ORDER — ACYCLOVIR 400 MG PO TABS: 400.0000 mg | ORAL_TABLET | Freq: Four times a day (QID) | ORAL | Status: DC

## 2014-12-29 NOTE — ED Notes (Signed)
Sore areas in mouth for 3-4 days, seen by MD and dx with virus.  Taking doxycycline and prednisone for bronchitis.

## 2014-12-29 NOTE — ED Provider Notes (Signed)
CSN: [id number]     Arrival date & time 12/29/14  1525 History  This chart was scribed for Raeford Razor, MD by Bronson Curb, ED Scribe. This patient was seen in room [facility]14/[facility]14 and the patient's care was started at 4:10 PM.   No chief complaint on file.   The history is provided by the patient. No language interpreter was used.     HPI Comments: Deanna Acosta is a 79 y.o. female who presents to the Emergency Department complaining of painful oral lesions for the past 3-4 days. Patient was seen by PCP and was diagnosed with a virus. She was given Prednisone and doxycycline, and states she later developed sore areas in her mouth. There is associated light headedness. Patient report her breathing has improved since taking this medication. She denies any other symptoms.   Past Medical History  Diagnosis Date  . Allergy     seasonal  . Depression   . Anxiety   . Arthritis   . Asthma     bronchitis  . GERD (gastroesophageal reflux disease)   . Hyperlipidemia   . Hypertension   . Osteoporosis   . Ulcer     H-pylorie treated  . RSD (reflex sympathetic dystrophy)   . COPD (chronic obstructive pulmonary disease)   . Pneumonia     2013  . Blood clot in vein     LEG    YEARS AGO  . Blood dyscrasia     ELEVATED WBC  HX  . Shortness of breath    Past Surgical History  Procedure Laterality Date  . Rotator cuff repair  2009  . Cholecystectomy  2000  . Orif distal radius fracture  2002    plate  . Total abdominal hysterectomy  1980    fibroids  . Knee arthroscopy      right  . Elbow arthroscopy      tendonitis/right  . Tonsillectomy and adenoidectomy    . Ganglion cyst excision      left wrist  . Knee ligament reconstruction      right/water ski accident  . Total knee arthroplasty Left 06/20/2013    Procedure: LEFT TOTAL KNEE ARTHROPLASTY;  Surgeon: Verlee Rossetti, MD;  Location: [facility] OR;  Service: Orthopedics;  Laterality: Left;  . Cataract extraction w/phaco Left  11/29/2013    Procedure: CATARACT EXTRACTION PHACO AND INTRAOCULAR LENS PLACEMENT (IOC);  Surgeon: Loraine Leriche T. Nile Riggs, MD;  Location: [facility];  Service: Ophthalmology;  Laterality: Left;  CDE 21.93  . Cataract extraction w/phaco Right 12/13/2013    Procedure: CATARACT EXTRACTION PHACO AND INTRAOCULAR LENS PLACEMENT (IOC);  Surgeon: Loraine Leriche T. Nile Riggs, MD;  Location: [facility];  Service: Ophthalmology;  Laterality: Right;  CDE:5.40  . Joint replacement     Family History  Problem Relation Age of Onset  . Ulcers Father     bleeding/gastric  . Colon cancer Neg Hx   . Esophageal cancer Neg Hx   . Stomach cancer Neg Hx    History  Substance Use Topics  . Smoking status: Former Smoker -- 2.00 packs/day for 54 years    Types: Cigarettes    Quit date: 02/25/1999  . Smokeless tobacco: Never Used  . Alcohol Use: Yes     Comment: only on anniversary    OB History    No data available     Review of Systems  HENT: Positive for mouth sores.   Neurological: Positive for light-headedness.  All other systems reviewed and are negative.  Allergies  Penicillins and Levofloxacin  Home Medications   Prior to Admission medications   Medication Sig Start Date End Date Taking? Authorizing Provider  aspirin EC 81 MG tablet Take 81 mg by mouth daily.    Historical Provider, MD  Cholecalciferol (VITAMIN D) 2000 UNITS CAPS Take 1 capsule by mouth daily.    Historical Provider, MD  diltiazem (CARDIZEM CD) 120 MG 24 hr capsule  08/01/14   Historical Provider, MD  diltiazem (DILACOR XR) 240 MG 24 hr capsule Take 240 mg by mouth daily.    Historical Provider, MD  FLUoxetine (PROZAC) 40 MG capsule Take 40 mg by mouth daily before breakfast.     Historical Provider, MD  fluticasone (FLONASE) 50 MCG/ACT nasal spray  06/13/14   Historical Provider, MD  Fluticasone-Salmeterol (ADVAIR) 250-50 MCG/DOSE AEPB Inhale 1 puff into the lungs every 12 (twelve) hours.    Historical Provider, MD  losartan (COZAAR) 100 MG  tablet Take 100 mg by mouth every evening.     Historical Provider, MD  lovastatin (MEVACOR) 40 MG tablet Take 40 mg by mouth every evening.    Historical Provider, MD  omeprazole (PRILOSEC) 40 MG capsule Take 40 mg by mouth daily.     Historical Provider, MD   Triage Vitals: BP 183/81 mmHg  Pulse 72  Temp(Src) 97.9 F (36.6 C) (Oral)  Resp 19  Ht 5' 4.5" (1.638 m)  Wt 194 lb (87.998 kg)  BMI 32.80 kg/m2  SpO2 99%  Physical Exam  Constitutional: She is oriented to person, place, and time. She appears well-developed and well-nourished. No distress.  HENT:  Head: Normocephalic and atraumatic.  Mouth/Throat: Oral lesions present.  Multiple vesicular lesions on tongue and on mucosa.  Eyes: Conjunctivae and EOM are normal.  Neck: Neck supple. No tracheal deviation present.  Cardiovascular: Normal rate.   Pulmonary/Chest: Effort normal. No respiratory distress.  Musculoskeletal: Normal range of motion.  Neurological: She is alert and oriented to person, place, and time.  Skin: Skin is warm and dry.  Psychiatric: She has a normal mood and affect. Her behavior is normal.  Nursing note and vitals reviewed.   ED Course  Procedures (including critical care time)  DIAGNOSTIC STUDIES: Oxygen Saturation is 99% on room air, normal by my interpretation.    COORDINATION OF CARE: At 1613 Discussed treatment plan with patient which includes magic mouthwash. Patient agrees.   Labs Review Labs Reviewed - No data to display  Imaging Review No results found.   EKG Interpretation None      MDM   Final diagnoses:  Stomatitis and mucositis   79 year old female with stomatitis. Suspect viral etiology. She is not a chemotherapy. she is afebrile. No drooling or dysphagia. She is prescribed acyclovir magic mouthwash for symptom treatment. Return precautions were discussed.   I personally preformed the services scribed in my presence. The recorded information has been reviewed is  accurate. Raeford RazorStephen Labrisha Wuellner, MD.    Raeford RazorStephen Donevin Sainsbury, MD 01/03/15 [phone]

## 2014-12-29 NOTE — ED Notes (Signed)
Patient given discharge instruction, verbalized understand. Patient ambulatory out of the department.  

## 2014-12-29 NOTE — Discharge Instructions (Signed)
Stomatitis °Stomatitis is an inflammation of the mucous lining of the mouth. It can affect part of the mouth or the whole mouth. The intensity of symptoms can range from mild to severe. It can affect your cheek, teeth, gums, lips, or tongue. In almost all cases, the lining of the mouth becomes swollen, red, and painful. Painful ulcers can develop in your mouth. Stomatitis recurs in some people. °CAUSES  °There are many common causes of stomatitis. They include: °· Viruses (such as cold sores or shingles). °· Canker sores. °· Bacteria (such as ulcerative gingivitis or sexually transmitted diseases). °· Fungus or yeast (such as candidiasis or oral thrush). °· Poor oral hygiene and poor nutrition (Vincent's stomatitis or trench mouth). °· Lack of vitamin B, vitamin C, or niacin. °· Dentures or braces that do not fit properly. °· High acid foods (uncommon). °· Sharp or broken teeth. °· Cheek biting. °· Breathing through the mouth. °· Chewing tobacco. °· Allergy to toothpaste, mouthwash, candy, gum, lipstick, or some medicines. °· Burning your mouth with hot drinks or food. °· Exposure to dyes, heavy metals, acid fumes, or mineral dust. °SYMPTOMS  °· Painful ulcers in the mouth. °· Blisters in the mouth. °· Bleeding gums. °· Swollen gums. °· Irritability. °· Bad breath. °· Bad taste in the mouth. °· Fever. °· Trouble eating because of burning and pain in the mouth. °DIAGNOSIS  °Your caregiver will examine your mouth and look for bleeding gums and mouth ulcers. Your caregiver may ask you about the medicines you are taking. Your caregiver may suggest a blood test and tissue sample (biopsy) of the mouth ulcer or mass if either is present. This will help find the cause of your condition. °TREATMENT  °Your treatment will depend on the cause of your condition. Your caregiver will first try to treat your symptoms.  °· You may be given pain medicine. Topical anesthetic may be used to numb the area if you have severe  pain. °· Your caregiver may prescribe antibiotic medicine if you have a bacterial infection. °· Your caregiver may prescribe antifungal medicine if you have a fungal infection. °· You may need to take antiviral medicine if you have a viral infection like herpes. °· You may be asked to use medicated mouth rinses. °· Your caregiver will advise you about proper brushing and using a soft toothbrush. You also need to get your teeth cleaned regularly. °HOME CARE INSTRUCTIONS  °· Maintain good oral hygiene. This is especially important for transplant patients. °· Brush your teeth carefully with a soft, nylon-bristled toothbrush. °· Floss at least 2 times a day. °· Clean your mouth after eating. °· Rinse your mouth with salt water 3 to 4 times a day. °· Gargle with cold water. °· Use topical numbing medicines to decrease pain if recommended by your caregiver. °· Stop smoking, and stop using chewing or smokeless tobacco. °· Avoid eating hot and spicy foods. °· Eat soft and bland food. °· Reduce your stress wherever possible. °· Eat healthy and nutritious foods. °SEEK MEDICAL CARE IF:  °· Your symptoms persist or get worse. °· You develop new symptoms. °· Your mouth ulcers are present for more than 3 weeks. °· Your mouth ulcers come back frequently. °· You have increasing difficulty with normal eating and drinking. °· You have increasing fatigue or weakness. °· You develop loss of appetite or nausea. °SEEK IMMEDIATE MEDICAL CARE IF:  °· You have a fever. °· You develop pain, redness, or sores around one or both   eyes. °· You cannot eat or drink because of pain or other symptoms. °· You develop worsening weakness, or you faint. °· You develop vomiting or diarrhea. °· You develop chest pain, shortness of breath, or rapid and irregular heartbeats. °MAKE SURE YOU: °· Understand these instructions. °· Will watch your condition. °· Will get help right away if you are not doing well or get worse. °Document Released: 07/20/2007  Document Revised: 12/15/2011 Document Reviewed: 05/01/2011 °[employer]® Patient Information ©2015 [employer]. This information is not intended to replace advice given to you by your health care provider. Make sure you discuss any questions you have with your health care provider. ° °

## 2015-07-23 ENCOUNTER — Other Ambulatory Visit: Payer: Self-pay | Admitting: *Deleted

## 2015-07-23 DIAGNOSIS — I83893 Varicose veins of bilateral lower extremities with other complications: Secondary | ICD-10-CM

## 2015-09-08 ENCOUNTER — Encounter (HOSPITAL_COMMUNITY): Payer: Self-pay | Admitting: Emergency Medicine

## 2015-09-08 ENCOUNTER — Emergency Department (HOSPITAL_COMMUNITY): Payer: Medicare Other

## 2015-09-08 ENCOUNTER — Emergency Department (HOSPITAL_COMMUNITY)
Admission: EM | Admit: 2015-09-08 | Discharge: 2015-09-08 | Disposition: A | Discharge status: Home / Self Care | Payer: Medicare Other | Attending: Emergency Medicine | Admitting: Emergency Medicine

## 2015-09-08 DIAGNOSIS — Y9389 Activity, other specified: Secondary | ICD-10-CM | POA: Insufficient documentation

## 2015-09-08 DIAGNOSIS — Y998 Other external cause status: Secondary | ICD-10-CM | POA: Diagnosis not present

## 2015-09-08 DIAGNOSIS — Y9289 Other specified places as the place of occurrence of the external cause: Secondary | ICD-10-CM | POA: Diagnosis not present

## 2015-09-08 DIAGNOSIS — E785 Hyperlipidemia, unspecified: Secondary | ICD-10-CM | POA: Insufficient documentation

## 2015-09-08 DIAGNOSIS — K219 Gastro-esophageal reflux disease without esophagitis: Secondary | ICD-10-CM | POA: Insufficient documentation

## 2015-09-08 DIAGNOSIS — Z8701 Personal history of pneumonia (recurrent): Secondary | ICD-10-CM | POA: Diagnosis not present

## 2015-09-08 DIAGNOSIS — F419 Anxiety disorder, unspecified: Secondary | ICD-10-CM | POA: Diagnosis not present

## 2015-09-08 DIAGNOSIS — I10 Essential (primary) hypertension: Secondary | ICD-10-CM | POA: Diagnosis not present

## 2015-09-08 DIAGNOSIS — S81011A Laceration without foreign body, right knee, initial encounter: Secondary | ICD-10-CM | POA: Insufficient documentation

## 2015-09-08 DIAGNOSIS — F329 Major depressive disorder, single episode, unspecified: Secondary | ICD-10-CM | POA: Insufficient documentation

## 2015-09-08 DIAGNOSIS — Z7951 Long term (current) use of inhaled steroids: Secondary | ICD-10-CM | POA: Diagnosis not present

## 2015-09-08 DIAGNOSIS — Z87891 Personal history of nicotine dependence: Secondary | ICD-10-CM | POA: Insufficient documentation

## 2015-09-08 DIAGNOSIS — Z880 Allergy status to penicillin: Secondary | ICD-10-CM | POA: Diagnosis not present

## 2015-09-08 DIAGNOSIS — W1839XA Other fall on same level, initial encounter: Secondary | ICD-10-CM | POA: Insufficient documentation

## 2015-09-08 DIAGNOSIS — M1990 Unspecified osteoarthritis, unspecified site: Secondary | ICD-10-CM | POA: Diagnosis not present

## 2015-09-08 DIAGNOSIS — Z7982 Long term (current) use of aspirin: Secondary | ICD-10-CM | POA: Insufficient documentation

## 2015-09-08 DIAGNOSIS — Z79899 Other long term (current) drug therapy: Secondary | ICD-10-CM | POA: Insufficient documentation

## 2015-09-08 DIAGNOSIS — J449 Chronic obstructive pulmonary disease, unspecified: Secondary | ICD-10-CM | POA: Insufficient documentation

## 2015-09-08 DIAGNOSIS — S8991XA Unspecified injury of right lower leg, initial encounter: Secondary | ICD-10-CM | POA: Diagnosis present

## 2015-09-08 MED ORDER — LIDOCAINE-EPINEPHRINE (PF) 1 %-1:200000 IJ SOLN
20.0000 mL | Freq: Once | INTRAMUSCULAR | Status: AC
  Administered 2015-09-08: 10 mL via INTRADERMAL
  Filled 2015-09-08: qty 20

## 2015-09-08 MED ORDER — ACETAMINOPHEN 500 MG PO TABS
1000.0000 mg | ORAL_TABLET | Freq: Once | ORAL | Status: AC
Start: 2015-09-08 — End: 2015-09-08
  Administered 2015-09-08: 1000 mg via ORAL
  Filled 2015-09-08: qty 2

## 2015-09-08 NOTE — ED Provider Notes (Signed)
CSN: [id number]     Arrival date & time 09/08/15  1220 History   First MD Initiated Contact with Patient 09/08/15 1230     Chief Complaint  Patient presents with  . Fall  . Knee Injury     (Consider location/radiation/quality/duration/timing/severity/associated sxs/prior Treatment) HPI Comments: 79 year old female with COPD, high blood pressure presents with right knee injury. Patient was on her way to the parade and caught her foot causing her to fall on her right knee. Pain upper tibia mild with palpation. Bleeding controlled, laceration from the injury. No head injury or other injuries. Patient feels well otherwise. Occurred prior to arrival. Past smoker  Patient is a 79 y.o. female presenting with fall. The history is provided by the patient.  Fall Pertinent negatives include no chest pain, no abdominal pain, no headaches and no shortness of breath.    Past Medical History  Diagnosis Date  . Allergy     seasonal  . Depression   . Anxiety   . Arthritis   . Asthma     bronchitis  . GERD (gastroesophageal reflux disease)   . Hyperlipidemia   . Hypertension   . Osteoporosis   . Ulcer     H-pylorie treated  . RSD (reflex sympathetic dystrophy)   . COPD (chronic obstructive pulmonary disease) (HCC)   . Pneumonia     2013  . Blood clot in vein     LEG    YEARS AGO  . Blood dyscrasia     ELEVATED WBC  HX  . Shortness of breath    Past Surgical History  Procedure Laterality Date  . Rotator cuff repair  2009  . Cholecystectomy  2000  . Orif distal radius fracture  2002    plate  . Total abdominal hysterectomy  1980    fibroids  . Knee arthroscopy      right  . Elbow arthroscopy      tendonitis/right  . Tonsillectomy and adenoidectomy    . Ganglion cyst excision      left wrist  . Knee ligament reconstruction      right/water ski accident  . Total knee arthroplasty Left 06/20/2013    Procedure: LEFT TOTAL KNEE ARTHROPLASTY;  Surgeon: Verlee Rossetti, MD;   Location: [facility] OR;  Service: Orthopedics;  Laterality: Left;  . Cataract extraction w/phaco Left 11/29/2013    Procedure: CATARACT EXTRACTION PHACO AND INTRAOCULAR LENS PLACEMENT (IOC);  Surgeon: Loraine Leriche T. Nile Riggs, MD;  Location: [facility] ORS;  Service: Ophthalmology;  Laterality: Left;  CDE 21.93  . Cataract extraction w/phaco Right 12/13/2013    Procedure: CATARACT EXTRACTION PHACO AND INTRAOCULAR LENS PLACEMENT (IOC);  Surgeon: Loraine Leriche T. Nile Riggs, MD;  Location: [facility] ORS;  Service: Ophthalmology;  Laterality: Right;  CDE:5.40  . Joint replacement     Family History  Problem Relation Age of Onset  . Ulcers Father     bleeding/gastric  . Colon cancer Neg Hx   . Esophageal cancer Neg Hx   . Stomach cancer Neg Hx    Social History  Substance Use Topics  . Smoking status: Former Smoker -- 2.00 packs/day for 54 years    Types: Cigarettes    Quit date: 02/25/1999  . Smokeless tobacco: Never Used  . Alcohol Use: Yes     Comment: only on anniversary    OB History    No data available     Review of Systems  Constitutional: Negative for fever and chills.  HENT: Negative for congestion.  Eyes: Negative for visual disturbance.  Respiratory: Negative for shortness of breath.   Cardiovascular: Negative for chest pain.  Gastrointestinal: Negative for vomiting and abdominal pain.  Genitourinary: Negative for dysuria and flank pain.  Musculoskeletal: Negative for back pain, neck pain and neck stiffness.  Skin: Positive for wound. Negative for rash.  Neurological: Negative for light-headedness and headaches.      Allergies  Penicillins and Levofloxacin  Home Medications   Prior to Admission medications   Medication Sig Start Date End Date Taking? Authorizing Provider  aspirin EC 81 MG tablet Take 81 mg by mouth daily.   Yes Historical Provider, MD  Cholecalciferol (VITAMIN D) 2000 UNITS CAPS Take 1 capsule by mouth daily.   Yes Historical Provider, MD  diltiazem (CARDIZEM CD) 120 MG 24 hr capsule  Take 120 mg by mouth daily.  08/01/14  Yes Historical Provider, MD  FLUoxetine (PROZAC) 40 MG capsule Take 40 mg by mouth daily before breakfast.    Yes Historical Provider, MD  fluticasone (FLONASE) 50 MCG/ACT nasal spray Place 1 spray into both nostrils daily.  06/13/14  Yes Historical Provider, MD  Fluticasone-Salmeterol (ADVAIR) 250-50 MCG/DOSE AEPB Inhale 1 puff into the lungs every 12 (twelve) hours.   Yes Historical Provider, MD  hydrochlorothiazide (HYDRODIURIL) 25 MG tablet Take 1 tablet by mouth daily. 09/06/15  Yes Historical Provider, MD  losartan (COZAAR) 100 MG tablet Take 100 mg by mouth every evening.    Yes Historical Provider, MD  lovastatin (MEVACOR) 40 MG tablet Take 40 mg by mouth every evening.   Yes Historical Provider, MD  omeprazole (PRILOSEC) 40 MG capsule Take 40 mg by mouth daily.    Yes Historical Provider, MD  acyclovir (ZOVIRAX) 400 MG tablet Take 1 tablet (400 mg total) by mouth 4 (four) times daily. Patient not taking: Reported on 09/08/2015 12/29/14   Raeford Razor, MD  Alum & Mag Hydroxide-Simeth (MAGIC MOUTHWASH W/LIDOCAINE) SOLN Take 5 mLs by mouth 4 (four) times daily as needed for mouth pain. Patient not taking: Reported on 09/08/2015 12/29/14   Raeford Razor, MD   BP 164/85 mmHg  Pulse 72  Temp(Src) 97.7 F (36.5 C) (Oral)  Resp 18  Ht  (1.626 m)  Wt 194 lb (87.998 kg)  BMI 33.28 kg/m2  SpO2 97% Physical Exam  Constitutional: She is oriented to person, place, and time. She appears well-developed and well-nourished.  HENT:  Head: Normocephalic and atraumatic.  Neck: Normal range of motion. No tracheal deviation present.  Cardiovascular: Normal rate and regular rhythm.   Pulmonary/Chest: Effort normal.  Musculoskeletal: She exhibits tenderness. She exhibits no edema.  Patient is mild tenderness proximal tibia with 5 cm irregular superficial laceration bleeding controlled. Patient has full range of motion of right knee flexion extension rotation with  mild discomfort palpation of laceration. Neurovascularly intact distal leg.  Neurological: She is alert and oriented to person, place, and time.  Skin: Skin is warm. No rash noted.  Psychiatric: She has a normal mood and affect.  Nursing note and vitals reviewed.   ED Course  Procedures (including critical care time) LACERATION REPAIR Performed by: Enid Skeens Authorized by: Enid Skeens Consent: Verbal consent obtained. Risks and benefits: risks, benefits and alternatives were discussed Consent given by: patient Patient identity confirmed: provided demographic data Prepped and Draped in normal sterile fashion Wound explored  Laceration Location: right knee Laceration Length: 5cm No Foreign Bodies seen or palpated Anesthesia: local infiltration Local anesthetic: lidocaine 1% w epinephrine Anesthetic total:  5 ml Amount of cleaning: standard  Skin closure: approximated Number of sutures: 6  Technique: interupted  Patient tolerance: Patient tolerated the procedure well with no immediate complications.   Labs Review Labs Reviewed - No data to display  Imaging Review Dg Knee Complete 4 Views Right  09/08/2015  CLINICAL DATA:  Fall with right knee pain. EXAM: RIGHT KNEE - COMPLETE 4+ VIEW COMPARISON:  None. FINDINGS: No fracture, joint effusion or dislocation. There is severe joint space narrowing with bulky marginal osteophytes in the medial compartment. Mild joint space narrowing with small marginal osteophytes in the lateral and patellofemoral compartments. IMPRESSION: 1. No fracture, joint effusion or dislocation in the right knee. 2. Tricompartmental right knee osteoarthritis, severe in the medial compartment. Electronically Signed   By: Delbert PhenixJason A Poff M.D.   On: 09/08/2015 13:54   I have personally reviewed and evaluated these images and lab results as part of my medical decision-making.   EKG Interpretation None      MDM   Final diagnoses:  Knee laceration,  right, initial encounter   Patient presents with mechanical fall, right knee injury and laceration. Plan for x-ray and suture of her laceration with outpatient follow-up.  Results and differential diagnosis were discussed with the patient/parent/guardian. Xrays were independently reviewed by myself.  Close follow up outpatient was discussed, comfortable with the plan.   Medications  lidocaine-EPINEPHrine (XYLOCAINE-EPINEPHrine) 1 %-1:200000 (PF) injection 20 mL (10 mLs Intradermal Given 09/08/15 1302)  acetaminophen (TYLENOL) tablet 1,000 mg (1,000 mg Oral Given 09/08/15 1301)    Filed Vitals:   09/08/15 1222  BP: 164/85  Pulse: 72  Temp: 97.7 F (36.5 C)  TempSrc: Oral  Resp: 18  Height: 5\' 4"  (1.626 m)  Weight: 194 lb (87.998 kg)  SpO2: 97%    Final diagnoses:  Knee laceration, right, initial encounter        Blane OharaJoshua Loucinda Croy, MD 09/08/15 [phone]

## 2015-09-08 NOTE — Discharge Instructions (Signed)
Use Tylenol as needed for pain, minimize flexion of that knee until healed. Watch for signs of infection such as fevers, spreading redness or pus draining. Follow-up with orthopedic doctor for severe arthritis. Have stitches removed in approximately 10 days. If you were given medicines take as directed.  If you are on coumadin or contraceptives realize their levels and effectiveness is altered by many different medicines.  If you have any reaction (rash, tongues swelling, other) to the medicines stop taking and see a physician.    If your blood pressure was elevated in the ER make sure you follow up for management with a primary doctor or return for chest pain, shortness of breath or stroke symptoms.  Please follow up as directed and return to the ER or see a physician for new or worsening symptoms.  Thank you. Filed Vitals:   09/08/15 1222  BP: 164/85  Pulse: 72  Temp: 97.7 F (36.5 C)  TempSrc: Oral  Resp: 18  Height: 5\' 4"  (1.626 m)  Weight: 194 lb (87.998 kg)  SpO2: 97%

## 2015-09-08 NOTE — ED Notes (Signed)
Pt states that she has a bad knee and she was going to the parade and it made her fall.  C/o laceration to right knee.

## 2015-09-19 ENCOUNTER — Other Ambulatory Visit: Payer: Self-pay

## 2015-09-19 DIAGNOSIS — Z1231 Encounter for screening mammogram for malignant neoplasm of breast: Secondary | ICD-10-CM

## 2015-09-20 ENCOUNTER — Encounter: Payer: Medicare Other | Admitting: Vascular Surgery

## 2015-09-20 ENCOUNTER — Encounter (HOSPITAL_COMMUNITY): Payer: Medicare Other

## 2015-10-24 ENCOUNTER — Ambulatory Visit (INDEPENDENT_AMBULATORY_CARE_PROVIDER_SITE_OTHER): Payer: Medicare Other | Admitting: Podiatry

## 2015-10-24 ENCOUNTER — Encounter: Payer: Self-pay | Admitting: Podiatry

## 2015-10-24 VITALS — BP 128/62 | HR 77 | Resp 14

## 2015-10-24 DIAGNOSIS — L600 Ingrowing nail: Secondary | ICD-10-CM

## 2015-10-24 NOTE — Progress Notes (Signed)
   Subjective:    Patient ID: Deanna Acosta, female    DOB: [birth year], 80 y.o.   MRN: [id number]  HPI this patient returns to the office with chief complaint of painful ingrowing toenails on both big toes of painful big toe borders both feet.  She says the nails are painful walking and wearing shoes.  The pain has worsened over the last few months.  No redness or drainage noted from the great nails both feet.  The patient presents here today with B/l great toes that are painful.  Review of Systems  All other systems reviewed and are negative.      Objective:   Physical Exam GENERAL APPEARANCE: Alert, conversant. Appropriately groomed. No acute distress.  VASCULAR: Pedal pulses palpable at  [facility] and PT bilateral.  Capillary refill time is immediate to all digits,  Normal temperature gradient.  Digital hair growth is present bilateral  NEUROLOGIC: sensation is normal to 5.07 monofilament at 5/5 sites bilateral.  Light touch is intact bilateral, Muscle strength normal.  MUSCULOSKELETAL: acceptable muscle strength, tone and stability bilateral.  Intrinsic muscluature intact bilateral.  Rectus appearance of foot and digits noted bilateral.   DERMATOLOGIC: skin color, texture, and turgor are within normal limits.  No preulcerative lesions or ulcers  are seen, no interdigital maceration noted.  No open lesions present.  Digital nails great toes both feet are incurvated in both medial and lateral borders B/L.Marland Kitchen No drainage noted.        Assessment & Plan:  Ingrown Toenails  Onychomycosis hallux B/l  IE  Debridement of nails hallux B/L  Helane Gunther DPM

## 2015-10-30 ENCOUNTER — Ambulatory Visit
Admission: RE | Admit: 2015-10-30 | Discharge: 2015-10-30 | Disposition: A | Discharge status: Home / Self Care | Payer: Medicare Other | Source: Ambulatory Visit

## 2015-10-30 DIAGNOSIS — Z1231 Encounter for screening mammogram for malignant neoplasm of breast: Secondary | ICD-10-CM

## 2015-12-04 ENCOUNTER — Encounter (HOSPITAL_COMMUNITY)
Admission: RE | Disposition: A | Discharge status: Home / Self Care | Payer: Self-pay | Source: Ambulatory Visit | Attending: Ophthalmology

## 2015-12-04 ENCOUNTER — Encounter (HOSPITAL_COMMUNITY): Payer: Self-pay | Admitting: *Deleted

## 2015-12-04 ENCOUNTER — Ambulatory Visit (HOSPITAL_COMMUNITY)
Admission: RE | Admit: 2015-12-04 | Discharge: 2015-12-04 | Disposition: A | Discharge status: Home / Self Care | Payer: Medicare Other | Source: Ambulatory Visit | Attending: Ophthalmology | Admitting: Ophthalmology

## 2015-12-04 DIAGNOSIS — J449 Chronic obstructive pulmonary disease, unspecified: Secondary | ICD-10-CM | POA: Insufficient documentation

## 2015-12-04 DIAGNOSIS — I10 Essential (primary) hypertension: Secondary | ICD-10-CM | POA: Diagnosis not present

## 2015-12-04 DIAGNOSIS — Z7951 Long term (current) use of inhaled steroids: Secondary | ICD-10-CM | POA: Diagnosis not present

## 2015-12-04 DIAGNOSIS — E7800 Pure hypercholesterolemia, unspecified: Secondary | ICD-10-CM | POA: Diagnosis not present

## 2015-12-04 DIAGNOSIS — M1991 Primary osteoarthritis, unspecified site: Secondary | ICD-10-CM | POA: Insufficient documentation

## 2015-12-04 DIAGNOSIS — H2640 Unspecified secondary cataract: Secondary | ICD-10-CM | POA: Diagnosis not present

## 2015-12-04 DIAGNOSIS — Z79899 Other long term (current) drug therapy: Secondary | ICD-10-CM | POA: Insufficient documentation

## 2015-12-04 HISTORY — PX: YAG LASER APPLICATION: SHX6189

## 2015-12-04 SURGERY — TREATMENT, USING YAG LASER
Anesthesia: LOCAL | Laterality: Left

## 2015-12-04 MED ORDER — TROPICAMIDE 1 % OP SOLN
OPHTHALMIC | Status: AC
  Filled 2015-12-04: qty 3

## 2015-12-04 MED ORDER — TROPICAMIDE 1 % OP SOLN
1.0000 [drp] | OPHTHALMIC | Status: AC
  Administered 2015-12-04 (×2): 1 [drp] via OPHTHALMIC

## 2015-12-04 NOTE — Discharge Instructions (Signed)
Deanna Acosta  12/04/2015     Instructions    Activity: No Restrictions.   Diet: Resume Diet you were on at home.   Pain Medication: Tylenol if Needed.   CONTACT YOUR DOCTOR IF YOU HAVE PAIN, REDNESS IN YOUR EYE, OR DECREASED VISION.   Follow-up:today with Jethro Bolus, MD.   Dr. Nile Riggs: [phone]  Dr. Lita Mains: [phone]  Dr. Alto Denver: [phone]   If you find that you cannot contact your physician, but feel that your signs and   Symptoms warrant a physician's attention, call the Emergency Room at   [phone] [phone].   Othern/a.  FOLLOW UP TODAY WITH DR. SHAPIRO AT 1:00 PM

## 2015-12-04 NOTE — H&P (Signed)
The patient was re examined and there is no change in the patients condition since the original H and P. 

## 2015-12-04 NOTE — Op Note (Signed)
Felissa Blouch T. Nile Riggs, MD  Procedure: Yag Capsulotomy  Yag Laser Self Test Completedyes. Procedure: Posterior Capsulotomy, Eye Protection Worn by Staff yes. Laser In Use Sign on Door yes.  Laser: Nd:YAG Spot Size: Fixed Burst Mode: III Power Setting: 3.2 mJ/burst Number of shots: 17 Total energy delivered: 51.12 mJ   The patient tolerated the procedure without difficulty. No complications were encountered.   The patient was discharged home with the instructions to continue all her current glaucoma medications, if any.   Patient instructed to go to office at 0100 for intraocular pressure check.  Patient verbalizes understanding of discharge instructions Yes.  .    Pre-Operative Diagnosis: After-Cataract, obscuring vision, 366.53 OS Post-Operative Diagnosis: After-Cataract, obscuring vision, 366.53 OS Date of Cataract Surgery: 11/29/2013

## 2015-12-05 ENCOUNTER — Encounter (HOSPITAL_COMMUNITY): Payer: Self-pay | Admitting: Ophthalmology

## 2016-01-16 ENCOUNTER — Encounter: Payer: Self-pay | Admitting: Podiatry

## 2016-01-16 ENCOUNTER — Ambulatory Visit (INDEPENDENT_AMBULATORY_CARE_PROVIDER_SITE_OTHER): Payer: Medicare Other | Admitting: Podiatry

## 2016-01-16 DIAGNOSIS — L600 Ingrowing nail: Secondary | ICD-10-CM | POA: Diagnosis not present

## 2016-01-16 NOTE — Progress Notes (Signed)
Subjective:     Patient ID: Deanna Acosta, female   DOB: Oct 11, 1935, 80 y.o.   MRN: [id number]  HPI patient returns to the office stating that her ingrown toenails on both big toes have returned. She states the nails are painful. She walks and wears her shoes. She says that after her first visit. She did well initially but pain has returned. She desires to have preventative foot care services provided today and desires to discuss surgical correction for the ingrowing toenails. She presents the office for  evaluation and treatment of her nails.   Review of Systems     Objective:   Physical Exam GENERAL APPEARANCE: Alert, conversant. Appropriately groomed. No acute distress.  VASCULAR: Pedal [facility]  [facility]  [facility].  Capillary refill time is immediate to all digits,  Normal temperature gradient.  Digital hair growth is present bilateral  NEUROLOGIC: sensation is normal to 5.07 monofilament at 5/5 sites bilateral.  Light touch is intact bilateral, Muscle strength normal.  MUSCULOSKELETAL: acceptable muscle strength, tone and stability bilateral.  Intrinsic muscluature intact bilateral.  Rectus appearance of foot and digits noted bilateral.   DERMATOLOGIC: skin color, texture, and turgor are within normal limits.  No preulcerative lesions or ulcers  are seen, no interdigital maceration noted.  No open lesions present.   No drainage noted. Marked incurvation medial border left hallux and lateral border right hallux.  Thick mycotic nails hallux B/L      Assessment:     Onychomycosis with incurvation hallux B/l     Plan:     Debridement of nails.  RTC prn for preventive foot care services.  To schedule nail surgery as desired.   Helane GuntherGregory Pamlea Finder DPM

## 2016-03-06 ENCOUNTER — Ambulatory Visit (INDEPENDENT_AMBULATORY_CARE_PROVIDER_SITE_OTHER): Payer: Medicare Other | Admitting: Podiatry

## 2016-03-06 ENCOUNTER — Encounter: Payer: Self-pay | Admitting: Podiatry

## 2016-03-06 VITALS — BP 127/64 | HR 70 | Resp 14

## 2016-03-06 DIAGNOSIS — L600 Ingrowing nail: Secondary | ICD-10-CM | POA: Diagnosis not present

## 2016-03-06 NOTE — Progress Notes (Signed)
Subjective:     Patient ID: Deanna Acosta, female   DOB: May 09, 1936, 80 y.o.   MRN: [id number]  HPI this patient presents to the office with chief complaint of continued ingrowing toenails both big toes both feet. She has been treated by myself for preventative foot care services, but the nails continued to become ingrown and painful. She states that the nails are painful walking and wearing her shoes. She says they are better after I trimmed her nails but they do return, we discussed surgical correction for this at the previous visit and she presents the office today desiring to proceed with correction of the ingrowing toenail. She says her right great toenail is more painful than her left   Review of Systems     Objective:   Physical Exam GENERAL APPEARANCE: Alert, conversant. Appropriately groomed. No acute distress.  VASCULAR: Pedal pulses are  palpable at  [facility] and [facility] bilateral.  Capillary refill time is immediate to all digits,  Normal temperature gradient.  Digital hair growth is present bilateral  NEUROLOGIC: sensation is normal to 5.07 monofilament at 5/5 sites bilateral.  Light touch is intact bilateral, Muscle strength normal.  MUSCULOSKELETAL: acceptable muscle strength, tone and stability bilateral.  Intrinsic muscluature intact bilateral.  Rectus appearance of foot and digits noted bilateral.   DERMATOLOGIC: skin color, texture, and turgor are within normal limits.  No preulcerative lesions or ulcers  are seen, no interdigital maceration noted.  No open lesions present.  . No drainage noted.  NAILS  Marked incurvation noted medial border right hallux.  No signs of redness or swelling or drainage.      Assessment:     Ingrowing toenails right hallux.  Paronychia right hallux.     Plan:     ROV>  Nail surgery right hallux.  Treatment options and alternatives discussed.  Recommended permanent phenol matrixectomy and patient agreed.  Right hallux was prepped with alcohol and a  toe block of 3cc of 2% lidocaine plain was administered in a digital toe block. .  The toe was then prepped with betadine solution .  The offending nail border was then excised and matrix tissue exposed.  Phenol was then applied to the matrix tissue followed by an alcohol wash.  Antibiotic ointment and a dry sterile dressing was applied.  The patient was dispensed instructions for aftercare. RTC 1 week.     Helane GuntherGregory Josephmichael Lisenbee DPM

## 2016-03-12 ENCOUNTER — Ambulatory Visit (INDEPENDENT_AMBULATORY_CARE_PROVIDER_SITE_OTHER): Payer: Medicare Other

## 2016-03-12 ENCOUNTER — Encounter: Payer: Self-pay | Admitting: Podiatry

## 2016-03-12 ENCOUNTER — Ambulatory Visit (INDEPENDENT_AMBULATORY_CARE_PROVIDER_SITE_OTHER): Payer: Medicare Other | Admitting: Podiatry

## 2016-03-12 VITALS — BP 121/72 | HR 100 | Resp 16

## 2016-03-12 DIAGNOSIS — M79672 Pain in left foot: Secondary | ICD-10-CM | POA: Diagnosis not present

## 2016-03-12 DIAGNOSIS — M729 Fibroblastic disorder, unspecified: Secondary | ICD-10-CM

## 2016-03-12 MED ORDER — TRIAMCINOLONE ACETONIDE 10 MG/ML IJ SUSP
10.0000 mg | Freq: Once | INTRAMUSCULAR | Status: AC
  Administered 2016-03-12: 10 mg

## 2016-03-12 NOTE — Progress Notes (Signed)
Subjective:     Patient ID: Deanna ChampagneGeraldine G Crumble, female   DOB: [birth year], 80 y.o.   MRN: [id number]  HPI patient presents with irritation on the lateral side of the left foot that's painful when pressed and states it's been present for several months. She's been treated by Dr. Thora LanceMeier for chronic ingrown toenails and is due to have her left one fixed tomorrow and has had her right one fixed which is healing well   Review of Systems     Objective:   Physical Exam Neurovascular status intact muscle strength adequate with inflammatory changes on the lateral side of the left lateral fascial band and just dorsal to this with inflammation and pain when I palpated the area    Assessment:     Nodule formation or possible fascial-like symptomatology lateral left    Plan:     H&P and x-rays reviewed. Discussed this may need to be removed at one point but today I did try a small cortisone injection around the area to see if I could shrink it and advised on heat therapy and we'll reevaluate again in the near future. Patient tolerated procedure well and will be seen back and I did use 3 mg dexamethasone Kenalog 5 mg Xylocaine  X-ray report indicated no indications of calcification lateral side with small spur formation

## 2016-03-13 ENCOUNTER — Encounter: Payer: Self-pay | Admitting: Podiatry

## 2016-03-13 ENCOUNTER — Ambulatory Visit (INDEPENDENT_AMBULATORY_CARE_PROVIDER_SITE_OTHER): Payer: Medicare Other | Admitting: Podiatry

## 2016-03-13 DIAGNOSIS — Z09 Encounter for follow-up examination after completed treatment for conditions other than malignant neoplasm: Secondary | ICD-10-CM

## 2016-03-13 NOTE — Progress Notes (Signed)
Patient ID: Deanna Acosta, female   DOB: 1936/09/20, 80 y.o.   MRN: [id number] This patient returns to the office following nail surgery one week ago.  The patient says toe has been soaked and bandaged as directed.  There has been improvement of the toe since the surgery has been performed. The patient presents for continued evaluation and treatment.  GENERAL APPEARANCE: Alert, conversant. Appropriately groomed. No acute distress.  VASCULAR: Pedal pulses palpable at  [facility] and PT bilateral.  Capillary refill time is immediate to all digits,  Normal temperature gradient.    NEUROLOGIC: sensation is normal to 5.07 monofilament at 5/5 sites bilateral.  Light touch is intact bilateral, Muscle strength normal.  MUSCULOSKELETAL: acceptable muscle strength, tone and stability bilateral.  Intrinsic muscluature intact bilateral.  Rectus appearance of foot and digits noted bilateral.   DERMATOLOGIC: skin color, texture, and turgor are within normal limits.  No preulcerative lesions or ulcers  are seen, no interdigital maceration noted.   NAILS  There is necrotic tissue along the nail groove  In the absence of redness swelling and pain.  DX  S/p nail surgery  ROV  Home instructions were discussed.  Patient to call the office if there are any questions or concerns. Patient to call to schedule left hallux nail surgery.   Helane GuntherGregory Trenita Hulme DPM

## 2016-05-19 ENCOUNTER — Ambulatory Visit (INDEPENDENT_AMBULATORY_CARE_PROVIDER_SITE_OTHER): Payer: Medicare Other | Admitting: Podiatry

## 2016-05-19 DIAGNOSIS — M729 Fibroblastic disorder, unspecified: Secondary | ICD-10-CM | POA: Diagnosis not present

## 2016-05-19 DIAGNOSIS — M79672 Pain in left foot: Secondary | ICD-10-CM | POA: Diagnosis not present

## 2016-05-22 NOTE — Progress Notes (Signed)
Subjective:     Patient ID: Deanna ChampagneGeraldine G Atkerson, female   DOB: [birth year], 80 y.o.   MRN: [id number]  HPI patient states she still has a little bit of cracking on her foot and it gets irritated   Review of Systems     Objective:   Physical Exam  Neurovascular status intact muscle strength adequate with patient found to have slight cracking plantar medial right heel that's localized in nature significant diminishment of plantar heel pain    Assessment:     Cracking which is most likely due to natural skin texture with improved fasciitis    Plan:     Advised on physical therapy anti-inflammatory and utilization of creams. Reappoint if symptoms persist or come back

## 2016-05-23 ENCOUNTER — Encounter: Payer: Self-pay | Admitting: Internal Medicine

## 2016-07-22 ENCOUNTER — Encounter: Payer: Self-pay | Admitting: Internal Medicine

## 2016-09-10 ENCOUNTER — Ambulatory Visit (AMBULATORY_SURGERY_CENTER): Payer: Self-pay | Admitting: *Deleted

## 2016-09-10 VITALS — Ht 64.0 in | Wt 192.0 lb

## 2016-09-10 DIAGNOSIS — Z86010 Personal history of colonic polyps: Secondary | ICD-10-CM

## 2016-09-10 MED ORDER — NA SULFATE-K SULFATE-MG SULF 17.5-3.13-1.6 GM/177ML PO SOLN
1.0000 | Freq: Once | ORAL | 0 refills | Status: AC
Start: 2016-09-10 — End: 2016-09-10

## 2016-09-10 NOTE — Progress Notes (Signed)
No egg or soy allergy known to patient  No issues with past sedation with any surgeries  or procedures, no intubation problems  No diet pills per patient No home 02 use per patient  No blood thinners per patient  Pt denies issues with constipation  No A fib or A flutter   

## 2016-09-11 ENCOUNTER — Encounter: Payer: Self-pay | Admitting: Internal Medicine

## 2016-09-18 ENCOUNTER — Encounter (HOSPITAL_COMMUNITY): Payer: Self-pay

## 2016-09-18 ENCOUNTER — Emergency Department (HOSPITAL_COMMUNITY)
Admission: EM | Admit: 2016-09-18 | Discharge: 2016-09-18 | Disposition: A | Discharge status: Home / Self Care | Payer: Medicare Other | Attending: Emergency Medicine | Admitting: Emergency Medicine

## 2016-09-18 ENCOUNTER — Emergency Department (HOSPITAL_COMMUNITY): Payer: Medicare Other

## 2016-09-18 DIAGNOSIS — J45909 Unspecified asthma, uncomplicated: Secondary | ICD-10-CM | POA: Insufficient documentation

## 2016-09-18 DIAGNOSIS — Z7982 Long term (current) use of aspirin: Secondary | ICD-10-CM | POA: Diagnosis not present

## 2016-09-18 DIAGNOSIS — Z87891 Personal history of nicotine dependence: Secondary | ICD-10-CM | POA: Insufficient documentation

## 2016-09-18 DIAGNOSIS — J209 Acute bronchitis, unspecified: Secondary | ICD-10-CM | POA: Diagnosis not present

## 2016-09-18 DIAGNOSIS — J449 Chronic obstructive pulmonary disease, unspecified: Secondary | ICD-10-CM | POA: Diagnosis not present

## 2016-09-18 DIAGNOSIS — Z96652 Presence of left artificial knee joint: Secondary | ICD-10-CM | POA: Diagnosis not present

## 2016-09-18 DIAGNOSIS — R05 Cough: Secondary | ICD-10-CM | POA: Diagnosis present

## 2016-09-18 DIAGNOSIS — I10 Essential (primary) hypertension: Secondary | ICD-10-CM | POA: Insufficient documentation

## 2016-09-18 LAB — CBC WITH DIFFERENTIAL/PLATELET
Basophils Absolute: 0.1 10*3/uL (ref 0.0–0.1)
Basophils Relative: 0 %
Eosinophils Absolute: 0.2 10*3/uL (ref 0.0–0.7)
Eosinophils Relative: 1 %
HCT: 40.2 % (ref 36.0–46.0)
Hemoglobin: 13.1 g/dL (ref 12.0–15.0)
Lymphocytes Relative: 15 %
Lymphs Abs: 2.9 10*3/uL (ref 0.7–4.0)
MCH: 28.7 pg (ref 26.0–34.0)
MCHC: 32.6 g/dL (ref 30.0–36.0)
MCV: 88 fL (ref 78.0–100.0)
Monocytes Absolute: 1.6 10*3/uL — ABNORMAL HIGH (ref 0.1–1.0)
Monocytes Relative: 9 %
Neutro Abs: 14.2 10*3/uL — ABNORMAL HIGH (ref 1.7–7.7)
Neutrophils Relative %: 75 %
Platelets: 308 10*3/uL (ref 150–400)
RBC: 4.57 MIL/uL (ref 3.87–5.11)
RDW: 14.6 % (ref 11.5–15.5)
WBC: 18.9 10*3/uL — ABNORMAL HIGH (ref 4.0–10.5)

## 2016-09-18 LAB — BASIC METABOLIC PANEL
Anion gap: 11 (ref 5–15)
BUN: 30 mg/dL — ABNORMAL HIGH (ref 6–20)
CO2: 19 mmol/L — ABNORMAL LOW (ref 22–32)
Calcium: 9 mg/dL (ref 8.9–10.3)
Chloride: 103 mmol/L (ref 101–111)
Creatinine, Ser: 1.92 mg/dL — ABNORMAL HIGH (ref 0.44–1.00)
GFR calc Af Amer: 28 mL/min — ABNORMAL LOW (ref >60)
GFR calc non Af Amer: 24 mL/min — ABNORMAL LOW (ref >60)
Glucose, Bld: 140 mg/dL — ABNORMAL HIGH (ref 65–99)
Potassium: 3.7 mmol/L (ref 3.5–5.1)
Sodium: 133 mmol/L — ABNORMAL LOW (ref 135–145)

## 2016-09-18 MED ORDER — IPRATROPIUM-ALBUTEROL 0.5-2.5 (3) MG/3ML IN SOLN
3.0000 mL | Freq: Once | RESPIRATORY_TRACT | Status: AC
  Administered 2016-09-18: 3 mL via RESPIRATORY_TRACT
  Filled 2016-09-18: qty 3

## 2016-09-18 MED ORDER — PREDNISONE 50 MG PO TABS
60.0000 mg | ORAL_TABLET | Freq: Once | ORAL | Status: AC
  Administered 2016-09-18: 60 mg via ORAL
  Filled 2016-09-18: qty 1

## 2016-09-18 MED ORDER — PSEUDOEPHEDRINE HCL ER 120 MG PO TB12
120.0000 mg | ORAL_TABLET | Freq: Two times a day (BID) | ORAL | 0 refills | Status: DC
Start: 2016-09-18 — End: 2016-09-28

## 2016-09-18 MED ORDER — PREDNISONE 10 MG (21) PO TBPK: ORAL_TABLET | ORAL | 0 refills | Status: DC

## 2016-09-18 NOTE — Discharge Instructions (Signed)
Continue the albuterol inhaler, take the steroids as prescribed, follow up with your doctor next week to make sure you are improving

## 2016-09-18 NOTE — ED Provider Notes (Signed)
[facility]PT Provider Note   CSN: [id number] Arrival date & time: 09/18/16  1415     History   Chief Complaint Chief Complaint  Patient presents with  . Back Pain  . Cough    HPI Deanna Acosta is a [age] y.o. female.  HPI Pt has been having trouble with cough , congestion, headache for the past 1.5 week.  She saw her doctor last on the 6th and she was started on prednisone and abx.  Pt does not feel like she is getting any better.  She still has congestion.  She thinks she might have had fevers but she has not taken her temperature.   No vomiting.  Some loose stools.  No CP.  She feels like she is getting worse and wants to get better before Christmas Past Medical History:  Diagnosis Date  . Allergy    seasonal  . Anxiety   . Arthritis   . Asthma    bronchitis  . Blood clot in vein    LEG    YEARS AGO  . Blood dyscrasia    ELEVATED WBC  HX  . Cataract    removed bilaterally   . Clotting disorder (HCC)    clot in leg   . COPD (chronic obstructive pulmonary disease) (HCC)   . Depression   . GERD (gastroesophageal reflux disease)   . Hyperlipidemia   . Hypertension   . Osteoporosis   . Pneumonia    2013  . RSD (reflex sympathetic dystrophy)   . Shortness of breath   . Ulcer (HCC)    H-pylorie treated    Patient Active Problem List   Diagnosis Date Noted  . Headache(784.0) 09/24/2012  . COPD exacerbation (HCC) 09/23/2012  . Hypoxemia 09/23/2012  . Vomiting 09/23/2012  . Dehydration 09/23/2012  . HTN (hypertension) 09/23/2012  . Renal insufficiency 09/23/2012    Past Surgical History:  Procedure Laterality Date  . CATARACT EXTRACTION W/PHACO Left 11/29/2013   Procedure: CATARACT EXTRACTION PHACO AND INTRAOCULAR LENS PLACEMENT (IOC);  Surgeon: Loraine LericheMark T. Nile RiggsShapiro, MD;  Location: [facility];  Service: Ophthalmology;  Laterality: Left;  CDE 21.93  . CATARACT EXTRACTION W/PHACO Right 12/13/2013   Procedure: CATARACT EXTRACTION PHACO AND INTRAOCULAR LENS  PLACEMENT (IOC);  Surgeon: Loraine LericheMark T. Nile RiggsShapiro, MD;  Location: [facility];  Service: Ophthalmology;  Laterality: Right;  CDE:5.40  . CHOLECYSTECTOMY  2000  . COLONOSCOPY    . ELBOW ARTHROSCOPY     tendonitis/right  . GANGLION CYST EXCISION     left wrist  . JOINT REPLACEMENT Left    knee- left   . KNEE ARTHROSCOPY     right  . KNEE LIGAMENT RECONSTRUCTION     right/water ski accident  . ORIF DISTAL RADIUS FRACTURE  2002   plate  . POLYPECTOMY    . ROTATOR CUFF REPAIR  2009  . TONSILLECTOMY AND ADENOIDECTOMY    . TOTAL ABDOMINAL HYSTERECTOMY  1980   fibroids  . TOTAL KNEE ARTHROPLASTY Left 06/20/2013   Procedure: LEFT TOTAL KNEE ARTHROPLASTY;  Surgeon: Verlee RossettiSteven R Norris, MD;  [facility];  Service: Orthopedics;  Laterality: Left;  . UPPER GASTROINTESTINAL ENDOSCOPY    . YAG LASER APPLICATION Left 12/04/2015   Procedure: YAG LASER APPLICATION;  Surgeon: Jethro BolusMark Shapiro, MD;  Location: [facility];  Service: Ophthalmology;  Laterality: Left;    OB History    No data available       Home Medications    Prior to Admission medications   Medication  Sig Start Date End Date Taking? Authorizing Provider  acetaminophen (TYLENOL) 325 MG tablet Take 650 mg by mouth every 6 (six) hours as needed for moderate pain.    Historical Provider, MD  albuterol (PROVENTIL HFA;VENTOLIN HFA) 108 (90 Base) MCG/ACT inhaler Inhale into the lungs every 6 (six) hours as needed for wheezing or shortness of breath.    Historical Provider, MD  aspirin EC 81 MG tablet Take 81 mg by mouth daily.    Historical Provider, MD  atorvastatin (LIPITOR) 10 MG tablet  03/05/16   Historical Provider, MD  benzonatate (TESSALON) 100 MG capsule Take by mouth 3 (three) times daily as needed for cough.    Historical Provider, MD  Cholecalciferol (VITAMIN D) 2000 UNITS CAPS Take 1 capsule by mouth daily.    Historical Provider, MD  cycloSPORINE (RESTASIS) 0.05 % ophthalmic emulsion Place 1 drop into both eyes 2 times daily. 11/27/15    Historical Provider, MD  diltiazem (CARDIZEM CD) 120 MG 24 hr capsule Take 120 mg by mouth daily.  08/01/14   Historical Provider, MD  fluocinonide cream (LIDEX) 0.05 % Apply 1 application topically daily. 11/23/15   Historical Provider, MD  FLUoxetine (PROZAC) 40 MG capsule Take 40 mg by mouth daily before breakfast.     Historical Provider, MD  fluticasone (FLONASE) 50 MCG/ACT nasal spray Place 1 spray into both nostrils daily as needed for allergies.  06/13/14   Historical Provider, MD  Fluticasone-Salmeterol (ADVAIR) 250-50 MCG/DOSE AEPB Inhale 1 puff into the lungs every 12 (twelve) hours.    Historical Provider, MD  hydrochlorothiazide (HYDRODIURIL) 25 MG tablet Take 1 tablet by mouth daily. 09/06/15   Historical Provider, MD  loratadine (CLARITIN) 10 MG tablet Take 10 mg by mouth daily.    Historical Provider, MD  losartan (COZAAR) 100 MG tablet Take 100 mg by mouth every evening.     Historical Provider, MD  lovastatin (MEVACOR) 40 MG tablet Take 40 mg by mouth every evening.    Historical Provider, MD  Multiple Vitamin (MULTIVITAMIN) tablet Take 1 tablet by mouth daily.    Historical Provider, MD  omeprazole (PRILOSEC) 40 MG capsule Take 40 mg by mouth daily.     Historical Provider, MD  predniSONE (STERAPRED UNI-PAK 21 TAB) 10 MG (21) TBPK tablet Take 6 tabs by mouth daily  for 2 days, then 5 tabs for 2 days, then 4 tabs for 2 days, then 3 tabs for 2 days, 2 tabs for 2 days, then 1 tab by mouth daily for 2 days 09/18/16   Linwood Dibbles, MD  pseudoephedrine (SUDAFED 12 HOUR) 120 MG 12 hr tablet Take 1 tablet (120 mg total) by mouth every 12 (twelve) hours. 09/18/16   Linwood Dibbles, MD  risedronate (ACTONEL) 150 MG tablet Take 150 mg by mouth every 30 (thirty) days.  10/11/15   Historical Provider, MD  SPIRIVA HANDIHALER 18 MCG inhalation capsule Place 18 mcg into inhaler and inhale daily.  09/28/15   Historical Provider, MD    Family History Family History  Problem Relation Age of Onset  . Ulcers  Father     bleeding/gastric  . Cancer Father   . Colon cancer Neg Hx   . Esophageal cancer Neg Hx   . Stomach cancer Neg Hx   . Colon polyps Neg Hx   . Rectal cancer Neg Hx     Social History Social History  Substance Use Topics  . Smoking status: Former Smoker    Packs/day: 2.00    Years:  54.00    Types: Cigarettes    Quit date: 02/25/1999  . Smokeless tobacco: Never Used  . Alcohol use Yes     Comment: only on anniversary      Allergies   Penicillins and Levofloxacin   Review of Systems Review of Systems  All other systems reviewed and are negative.    Physical Exam Updated Vital Signs BP 112/67   Pulse 102   Temp 97.4 F (36.3 C) (Oral)   Resp 16   Ht 5\' 4"  (1.626 m)   Wt 87.1 kg   SpO2 99%   BMI 32.96 kg/m   Physical Exam  Constitutional: No distress.  HENT:  Head: Normocephalic and atraumatic.  Right Ear: External ear normal. Tympanic membrane is not injected, not scarred, not perforated and not erythematous. No middle ear effusion.  Left Ear: External ear normal. Tympanic membrane is not injected, not scarred, not perforated and not erythematous. A middle ear effusion is present.  Eyes: Conjunctivae are normal. Right eye exhibits no discharge. Left eye exhibits no discharge. No scleral icterus.  Neck: Neck supple. No tracheal deviation present.  Cardiovascular: Normal rate, regular rhythm and intact distal pulses.   Pulmonary/Chest: Effort normal. No stridor. No respiratory distress. She has wheezes. She has no rales.  Able to speak in full sentences  Abdominal: Soft. Bowel sounds are normal. She exhibits no distension. There is no tenderness. There is no rebound and no guarding.  Musculoskeletal: She exhibits no edema or tenderness.  Neurological: She is alert. She has normal strength. No cranial nerve deficit (no facial droop, extraocular movements intact, no slurred speech) or sensory deficit. She exhibits normal muscle tone. She displays no seizure  activity. Coordination normal.  Skin: Skin is warm and dry. No rash noted. She is not diaphoretic.  Psychiatric: She has a normal mood and affect.  Nursing note and vitals reviewed.    ED Treatments / Results  Labs (all labs ordered are listed, but only abnormal results are displayed) Labs Reviewed  CBC WITH DIFFERENTIAL/PLATELET - Abnormal; Notable for the following:       Result Value   WBC 18.9 (*)    Neutro Abs 14.2 (*)    Monocytes Absolute 1.6 (*)    All other components within normal limits  BASIC METABOLIC PANEL - Abnormal; Notable for the following:    Sodium 133 (*)    CO2 19 (*)    Glucose, Bld 140 (*)    BUN 30 (*)    Creatinine, Ser 1.92 (*)    GFR calc non Af Amer 24 (*)    GFR calc Af Amer 28 (*)    All other components within normal limits    EKG  EKG Interpretation None       Radiology Dg Chest 2 View  Result Date: 09/18/2016 CLINICAL DATA:  Cough for 1 week with shortness of Breath EXAM: CHEST  2 VIEW COMPARISON:  11/09/2015 FINDINGS: Cardiac shadow is stable. Aortic calcifications are again noted. The lungs are well-aerated without focal infiltrate or sizable effusion. No acute bony abnormality is seen. IMPRESSION: No active cardiopulmonary disease. Electronically Signed   By: Alcide Clever M.D.   On: 09/18/2016 14:46    Procedures Procedures (including critical care time)  Medications Ordered in ED Medications  ipratropium-albuterol (DUONEB) 0.5-2.5 (3) MG/3ML nebulizer solution 3 mL (3 mLs Nebulization Given 09/18/16 1524)  predniSONE (DELTASONE) tablet 60 mg (60 mg Oral Given 09/18/16 1523)     Initial Impression / Assessment and  Plan / ED Course  I have reviewed the triage vital signs and the nursing notes.  Pertinent labs & imaging results that were available during my care of the patient were reviewed by me and considered in my medical decision making (see chart for details).  Clinical Course     No PNA on CXR.  WBC is elevated but  pt has been on steroids.  Electrolyte panel with renal insufficiency.  No old for comparison.  Suspect chronic as pt has not been having any trouble with vomiting or decreased po intake.  Suspect uri with bronchospasm.  DC home with steroid taper.  Try sudafed for congestion  Final Clinical Impressions(s) / ED Diagnoses   Final diagnoses:  Bronchitis with bronchospasm    New Prescriptions New Prescriptions   PREDNISONE (STERAPRED UNI-PAK 21 TAB) 10 MG (21) TBPK TABLET    Take 6 tabs by mouth daily  for 2 days, then 5 tabs for 2 days, then 4 tabs for 2 days, then 3 tabs for 2 days, 2 tabs for 2 days, then 1 tab by mouth daily for 2 days   PSEUDOEPHEDRINE (SUDAFED 12 HOUR) 120 MG 12 HR TABLET    Take 1 tablet (120 mg total) by mouth every 12 (twelve) hours.     Linwood DibblesJon Jaloni Sorber, MD 09/18/16 [phone]

## 2016-09-18 NOTE — ED Triage Notes (Signed)
Pt reports pain in lower back, nasal congestion, and cough.  Pt says is on her 2nd round of antibiotics.

## 2016-09-24 ENCOUNTER — Encounter: Payer: Medicare Other | Admitting: Internal Medicine

## 2016-09-26 ENCOUNTER — Observation Stay (HOSPITAL_COMMUNITY)
Admission: EM | Admit: 2016-09-26 | Discharge: 2016-09-28 | Disposition: A | Discharge status: Home / Self Care | Payer: Medicare Other | Attending: Family Medicine | Admitting: Family Medicine

## 2016-09-26 ENCOUNTER — Emergency Department (HOSPITAL_COMMUNITY): Payer: Medicare Other

## 2016-09-26 ENCOUNTER — Encounter (HOSPITAL_COMMUNITY): Payer: Self-pay | Admitting: Emergency Medicine

## 2016-09-26 DIAGNOSIS — Z7982 Long term (current) use of aspirin: Secondary | ICD-10-CM | POA: Diagnosis not present

## 2016-09-26 DIAGNOSIS — Z87891 Personal history of nicotine dependence: Secondary | ICD-10-CM | POA: Diagnosis not present

## 2016-09-26 DIAGNOSIS — N183 Chronic kidney disease, stage 3 unspecified: Secondary | ICD-10-CM | POA: Insufficient documentation

## 2016-09-26 DIAGNOSIS — I10 Essential (primary) hypertension: Secondary | ICD-10-CM | POA: Diagnosis not present

## 2016-09-26 DIAGNOSIS — J441 Chronic obstructive pulmonary disease with (acute) exacerbation: Secondary | ICD-10-CM | POA: Diagnosis not present

## 2016-09-26 DIAGNOSIS — J45909 Unspecified asthma, uncomplicated: Secondary | ICD-10-CM | POA: Diagnosis not present

## 2016-09-26 DIAGNOSIS — R0902 Hypoxemia: Secondary | ICD-10-CM | POA: Diagnosis not present

## 2016-09-26 DIAGNOSIS — R0602 Shortness of breath: Secondary | ICD-10-CM | POA: Diagnosis present

## 2016-09-26 DIAGNOSIS — Z79899 Other long term (current) drug therapy: Secondary | ICD-10-CM | POA: Insufficient documentation

## 2016-09-26 DIAGNOSIS — J449 Chronic obstructive pulmonary disease, unspecified: Secondary | ICD-10-CM | POA: Diagnosis present

## 2016-09-26 LAB — CBC WITH DIFFERENTIAL/PLATELET
Basophils Absolute: 0 10*3/uL (ref 0.0–0.1)
Basophils Relative: 0 %
Eosinophils Absolute: 0 10*3/uL (ref 0.0–0.7)
Eosinophils Relative: 0 %
HCT: 39.6 % (ref 36.0–46.0)
Hemoglobin: 12.9 g/dL (ref 12.0–15.0)
Lymphocytes Relative: 6 %
Lymphs Abs: 1.1 10*3/uL (ref 0.7–4.0)
MCH: 28.9 pg (ref 26.0–34.0)
MCHC: 32.6 g/dL (ref 30.0–36.0)
MCV: 88.6 fL (ref 78.0–100.0)
Monocytes Absolute: 0.5 10*3/uL (ref 0.1–1.0)
Monocytes Relative: 3 %
Neutro Abs: 16.6 10*3/uL — ABNORMAL HIGH (ref 1.7–7.7)
Neutrophils Relative %: 91 %
Platelets: 233 10*3/uL (ref 150–400)
RBC: 4.47 MIL/uL (ref 3.87–5.11)
RDW: 14.5 % (ref 11.5–15.5)
WBC: 18.3 10*3/uL — ABNORMAL HIGH (ref 4.0–10.5)

## 2016-09-26 LAB — BASIC METABOLIC PANEL
Anion gap: 10 (ref 5–15)
BUN: 24 mg/dL — ABNORMAL HIGH (ref 6–20)
CO2: 23 mmol/L (ref 22–32)
Calcium: 8.8 mg/dL — ABNORMAL LOW (ref 8.9–10.3)
Chloride: 102 mmol/L (ref 101–111)
Creatinine, Ser: 1.38 mg/dL — ABNORMAL HIGH (ref 0.44–1.00)
GFR calc Af Amer: 41 mL/min — ABNORMAL LOW (ref >60)
GFR calc non Af Amer: 36 mL/min — ABNORMAL LOW (ref >60)
Glucose, Bld: 136 mg/dL — ABNORMAL HIGH (ref 65–99)
Potassium: 3.1 mmol/L — ABNORMAL LOW (ref 3.5–5.1)
Sodium: 135 mmol/L (ref 135–145)

## 2016-09-26 LAB — TROPONIN I: Troponin I: <0.03 ng/mL (ref <0.03)

## 2016-09-26 LAB — D-DIMER, QUANTITATIVE: D-Dimer, Quant: 0.49 ug/mL-FEU (ref 0.00–0.50)

## 2016-09-26 MED ORDER — METHYLPREDNISOLONE SODIUM SUCC 125 MG IJ SOLR
60.0000 mg | Freq: Four times a day (QID) | INTRAMUSCULAR | Status: DC
  Administered 2016-09-26 – 2016-09-28 (×6): 60 mg via INTRAVENOUS
  Filled 2016-09-26 (×6): qty 2

## 2016-09-26 MED ORDER — FLUOCINONIDE 0.05 % EX CREA
1.0000 "application " | TOPICAL_CREAM | Freq: Every day | CUTANEOUS | Status: DC
  Administered 2016-09-27: 1 via TOPICAL
  Filled 2016-09-26: qty 30

## 2016-09-26 MED ORDER — METHYLPREDNISOLONE SODIUM SUCC 125 MG IJ SOLR
125.0000 mg | Freq: Once | INTRAMUSCULAR | Status: AC
  Administered 2016-09-26: 125 mg via INTRAVENOUS
  Filled 2016-09-26: qty 2

## 2016-09-26 MED ORDER — HYDROCHLOROTHIAZIDE 25 MG PO TABS
25.0000 mg | ORAL_TABLET | Freq: Every day | ORAL | Status: DC
  Administered 2016-09-26 – 2016-09-28 (×3): 25 mg via ORAL
  Filled 2016-09-26 (×3): qty 1

## 2016-09-26 MED ORDER — ACETAMINOPHEN 650 MG RE SUPP: 650.0000 mg | Freq: Four times a day (QID) | RECTAL | Status: DC | PRN

## 2016-09-26 MED ORDER — FLUTICASONE PROPIONATE 50 MCG/ACT NA SUSP: 1.0000 | Freq: Every day | NASAL | Status: DC | PRN

## 2016-09-26 MED ORDER — MOMETASONE FURO-FORMOTEROL FUM 200-5 MCG/ACT IN AERO
INHALATION_SPRAY | RESPIRATORY_TRACT | Status: AC
  Filled 2016-09-26: qty 8.8

## 2016-09-26 MED ORDER — LOSARTAN POTASSIUM 50 MG PO TABS
100.0000 mg | ORAL_TABLET | Freq: Every evening | ORAL | Status: DC
Start: 2016-09-26 — End: 2016-09-28
  Administered 2016-09-26 – 2016-09-27 (×2): 100 mg via ORAL
  Filled 2016-09-26 (×2): qty 2

## 2016-09-26 MED ORDER — ASPIRIN EC 81 MG PO TBEC
81.0000 mg | DELAYED_RELEASE_TABLET | Freq: Every day | ORAL | Status: DC
  Administered 2016-09-26 – 2016-09-28 (×3): 81 mg via ORAL
  Filled 2016-09-26 (×3): qty 1

## 2016-09-26 MED ORDER — CYCLOSPORINE 0.05 % OP EMUL
1.0000 [drp] | Freq: Two times a day (BID) | OPHTHALMIC | Status: DC
  Administered 2016-09-27 (×2): 1 [drp] via OPHTHALMIC
  Filled 2016-09-26 (×6): qty 1

## 2016-09-26 MED ORDER — ALBUTEROL SULFATE (2.5 MG/3ML) 0.083% IN NEBU: 5.0000 mg | INHALATION_SOLUTION | Freq: Once | RESPIRATORY_TRACT | Status: DC

## 2016-09-26 MED ORDER — GUAIFENESIN ER 600 MG PO TB12
600.0000 mg | ORAL_TABLET | Freq: Two times a day (BID) | ORAL | Status: DC
  Administered 2016-09-26 – 2016-09-28 (×4): 600 mg via ORAL
  Filled 2016-09-26 (×4): qty 1

## 2016-09-26 MED ORDER — PANTOPRAZOLE SODIUM 40 MG PO TBEC
40.0000 mg | DELAYED_RELEASE_TABLET | Freq: Every day | ORAL | Status: DC
  Administered 2016-09-27 – 2016-09-28 (×2): 40 mg via ORAL
  Filled 2016-09-26 (×2): qty 1

## 2016-09-26 MED ORDER — MOMETASONE FURO-FORMOTEROL FUM 200-5 MCG/ACT IN AERO
2.0000 | INHALATION_SPRAY | Freq: Two times a day (BID) | RESPIRATORY_TRACT | Status: DC
  Administered 2016-09-26 – 2016-09-27 (×3): 2 via RESPIRATORY_TRACT
  Filled 2016-09-26: qty 8.8

## 2016-09-26 MED ORDER — IPRATROPIUM BROMIDE 0.02 % IN SOLN
1.0000 mg | Freq: Once | RESPIRATORY_TRACT | Status: AC
  Administered 2016-09-26: 1 mg via RESPIRATORY_TRACT
  Filled 2016-09-26: qty 5

## 2016-09-26 MED ORDER — DILTIAZEM HCL ER COATED BEADS 120 MG PO CP24
120.0000 mg | ORAL_CAPSULE | Freq: Every day | ORAL | Status: DC
  Administered 2016-09-27 – 2016-09-28 (×2): 120 mg via ORAL
  Filled 2016-09-26 (×2): qty 1

## 2016-09-26 MED ORDER — ACETAMINOPHEN 325 MG PO TABS
650.0000 mg | ORAL_TABLET | Freq: Four times a day (QID) | ORAL | Status: DC | PRN
  Administered 2016-09-27: 650 mg via ORAL
  Filled 2016-09-26: qty 2

## 2016-09-26 MED ORDER — ATORVASTATIN CALCIUM 10 MG PO TABS
10.0000 mg | ORAL_TABLET | Freq: Every day | ORAL | Status: DC
  Administered 2016-09-27: 10 mg via ORAL
  Filled 2016-09-26: qty 1

## 2016-09-26 MED ORDER — ALBUTEROL (5 MG/ML) CONTINUOUS INHALATION SOLN
10.0000 mg/h | INHALATION_SOLUTION | Freq: Once | RESPIRATORY_TRACT | Status: AC
  Administered 2016-09-26: 10 mg/h via RESPIRATORY_TRACT
  Filled 2016-09-26: qty 20

## 2016-09-26 MED ORDER — TIOTROPIUM BROMIDE MONOHYDRATE 18 MCG IN CAPS
18.0000 ug | ORAL_CAPSULE | Freq: Every day | RESPIRATORY_TRACT | Status: DC
  Administered 2016-09-27 – 2016-09-28 (×2): 18 ug via RESPIRATORY_TRACT
  Filled 2016-09-26: qty 5

## 2016-09-26 MED ORDER — DEXTROSE 5 % IV SOLN
INTRAVENOUS | Status: AC
  Filled 2016-09-26: qty 500

## 2016-09-26 MED ORDER — DEXTROSE 5 % IV SOLN
500.0000 mg | INTRAVENOUS | Status: DC
  Administered 2016-09-26 – 2016-09-27 (×2): 500 mg via INTRAVENOUS
  Filled 2016-09-26 (×4): qty 500

## 2016-09-26 MED ORDER — ENOXAPARIN SODIUM 40 MG/0.4ML ~~LOC~~ SOLN
40.0000 mg | SUBCUTANEOUS | Status: DC
  Administered 2016-09-26 – 2016-09-27 (×2): 40 mg via SUBCUTANEOUS
  Filled 2016-09-26 (×2): qty 0.4

## 2016-09-26 MED ORDER — ALBUTEROL SULFATE (2.5 MG/3ML) 0.083% IN NEBU
2.5000 mg | INHALATION_SOLUTION | RESPIRATORY_TRACT | Status: DC
  Administered 2016-09-26: 2.5 mg via RESPIRATORY_TRACT
  Filled 2016-09-26: qty 3

## 2016-09-26 MED ORDER — LEVALBUTEROL HCL 0.63 MG/3ML IN NEBU
0.6300 mg | INHALATION_SOLUTION | Freq: Once | RESPIRATORY_TRACT | Status: AC
  Administered 2016-09-26: 0.63 mg via RESPIRATORY_TRACT
  Filled 2016-09-26: qty 3

## 2016-09-26 MED ORDER — FLUOXETINE HCL 20 MG PO CAPS
40.0000 mg | ORAL_CAPSULE | Freq: Every day | ORAL | Status: DC
Start: 2016-09-27 — End: 2016-09-28
  Administered 2016-09-27 – 2016-09-28 (×2): 40 mg via ORAL
  Filled 2016-09-26 (×2): qty 2

## 2016-09-26 MED ORDER — TIOTROPIUM BROMIDE MONOHYDRATE 18 MCG IN CAPS
ORAL_CAPSULE | RESPIRATORY_TRACT | Status: AC
  Filled 2016-09-26: qty 5

## 2016-09-26 MED ORDER — LEVALBUTEROL HCL 0.63 MG/3ML IN NEBU
0.6300 mg | INHALATION_SOLUTION | RESPIRATORY_TRACT | Status: DC
  Administered 2016-09-26 – 2016-09-27 (×2): 0.63 mg via RESPIRATORY_TRACT
  Filled 2016-09-26 (×2): qty 3

## 2016-09-26 NOTE — ED Provider Notes (Signed)
[facility] Provider Note   CSN: [id number] Arrival date & time: 09/26/16  1229     History   Chief Complaint Chief Complaint  Patient presents with  . Shortness of Breath    HPI Deanna Acosta is a 80 y.o. female.  HPI  Pt was seen at 1250.  Per pt, c/o gradual onset and worsening of persistent cough, wheezing and SOB for the past 1 month.  Describes her symptoms as "my COPD."  Has been using home MDI and nebs with transient relief. Pt has been evaluated by her PMD and in the ED multiple times for same, rx prednisone with transient improvement. Has been associated with sinus congestion and runny/stuffy nose.  Denies CP/palpitations, no back pain, no abd pain, no N/V/D, no fevers, no rash.    Past Medical History:  Diagnosis Date  . Allergy    seasonal  . Anxiety   . Arthritis   . Asthma    bronchitis  . Blood clot in vein    LEG    YEARS AGO  . Blood dyscrasia    ELEVATED WBC  HX  . Cataract    removed bilaterally   . Clotting disorder (HCC)    clot in leg   . COPD (chronic obstructive pulmonary disease) (HCC)   . Depression   . GERD (gastroesophageal reflux disease)   . Hyperlipidemia   . Hypertension   . Osteoporosis   . Pneumonia    2013  . RSD (reflex sympathetic dystrophy)   . Shortness of breath   . Ulcer (HCC)    H-pylorie treated    Patient Active Problem List   Diagnosis Date Noted  . Headache(784.0) 09/24/2012  . COPD exacerbation (HCC) 09/23/2012  . Hypoxemia 09/23/2012  . Vomiting 09/23/2012  . Dehydration 09/23/2012  . HTN (hypertension) 09/23/2012  . Renal insufficiency 09/23/2012    Past Surgical History:  Procedure Laterality Date  . CATARACT EXTRACTION W/PHACO Left 11/29/2013   Procedure: CATARACT EXTRACTION PHACO AND INTRAOCULAR LENS PLACEMENT (IOC);  Surgeon: Loraine LericheMark T. Nile RiggsShapiro, MD;  Location: [facility];  Service: Ophthalmology;  Laterality: Left;  CDE 21.93  . CATARACT EXTRACTION W/PHACO Right 12/13/2013   Procedure:  CATARACT EXTRACTION PHACO AND INTRAOCULAR LENS PLACEMENT (IOC);  Surgeon: Loraine LericheMark T. Nile RiggsShapiro, MD;  Location: [facility];  Service: Ophthalmology;  Laterality: Right;  CDE:5.40  . CHOLECYSTECTOMY  2000  . COLONOSCOPY    . ELBOW ARTHROSCOPY     tendonitis/right  . GANGLION CYST EXCISION     left wrist  . JOINT REPLACEMENT Left    knee- left   . KNEE ARTHROSCOPY     right  . KNEE LIGAMENT RECONSTRUCTION     right/water ski accident  . ORIF DISTAL RADIUS FRACTURE  2002   plate  . POLYPECTOMY    . ROTATOR CUFF REPAIR  2009  . TONSILLECTOMY AND ADENOIDECTOMY    . TOTAL ABDOMINAL HYSTERECTOMY  1980   fibroids  . TOTAL KNEE ARTHROPLASTY Left 06/20/2013   Procedure: LEFT TOTAL KNEE ARTHROPLASTY;  Surgeon: Verlee RossettiSteven R Norris, MD;  [facility];  Service: Orthopedics;  Laterality: Left;  . UPPER GASTROINTESTINAL ENDOSCOPY    . YAG LASER APPLICATION Left 12/04/2015   Procedure: YAG LASER APPLICATION;  Surgeon: Jethro BolusMark Shapiro, MD;  Location: [facility];  Service: Ophthalmology;  Laterality: Left;    OB History    No data available       Home Medications    Prior to Admission medications   Medication Sig  Start Date End Date Taking? Authorizing Provider  acetaminophen (TYLENOL) 325 MG tablet Take 650 mg by mouth every 6 (six) hours as needed for moderate pain.   Yes Historical Provider, MD  albuterol (PROVENTIL HFA;VENTOLIN HFA) 108 (90 Base) MCG/ACT inhaler Inhale into the lungs every 6 (six) hours as needed for wheezing or shortness of breath.   Yes Historical Provider, MD  alendronate (FOSAMAX) 70 MG tablet Take 70 mg by mouth once a week.  09/10/16  Yes Historical Provider, MD  aspirin EC 81 MG tablet Take 81 mg by mouth daily.   Yes Historical Provider, MD  atorvastatin (LIPITOR) 10 MG tablet  03/05/16  Yes Historical Provider, MD  Cholecalciferol (VITAMIN D) 2000 UNITS CAPS Take 1 capsule by mouth daily.   Yes Historical Provider, MD  cycloSPORINE (RESTASIS) 0.05 % ophthalmic emulsion Place 1 drop  into both eyes 2 times daily. 11/27/15  Yes Historical Provider, MD  diltiazem (CARDIZEM CD) 120 MG 24 hr capsule Take 120 mg by mouth daily.  08/01/14  Yes Historical Provider, MD  fexofenadine (ALLEGRA) 180 MG tablet Take 180 mg by mouth daily.   Yes Historical Provider, MD  fluocinonide cream (LIDEX) 0.05 % Apply 1 application topically daily. 11/23/15  Yes Historical Provider, MD  FLUoxetine (PROZAC) 40 MG capsule Take 40 mg by mouth daily before breakfast.    Yes Historical Provider, MD  fluticasone (FLONASE) 50 MCG/ACT nasal spray Place 1 spray into both nostrils daily as needed for allergies.  06/13/14  Yes Historical Provider, MD  Fluticasone-Salmeterol (ADVAIR) 250-50 MCG/DOSE AEPB Inhale 1 puff into the lungs every 12 (twelve) hours.   Yes Historical Provider, MD  hydrochlorothiazide (HYDRODIURIL) 25 MG tablet Take 1 tablet by mouth daily. 09/06/15  Yes Historical Provider, MD  losartan (COZAAR) 100 MG tablet Take 100 mg by mouth every evening.    Yes Historical Provider, MD  lovastatin (MEVACOR) 40 MG tablet Take 40 mg by mouth every evening.   Yes Historical Provider, MD  Multiple Vitamin (MULTIVITAMIN) tablet Take 1 tablet by mouth daily.   Yes Historical Provider, MD  omeprazole (PRILOSEC) 40 MG capsule Take 40 mg by mouth daily.    Yes Historical Provider, MD  predniSONE (STERAPRED UNI-PAK 21 TAB) 10 MG (21) TBPK tablet Take 6 tabs by mouth daily  for 2 days, then 5 tabs for 2 days, then 4 tabs for 2 days, then 3 tabs for 2 days, 2 tabs for 2 days, then 1 tab by mouth daily for 2 days 09/18/16  Yes Linwood Dibbles, MD  pseudoephedrine (SUDAFED 12 HOUR) 120 MG 12 hr tablet Take 1 tablet (120 mg total) by mouth every 12 (twelve) hours. 09/18/16  Yes Linwood Dibbles, MD  SPIRIVA HANDIHALER 18 MCG inhalation capsule Place 18 mcg into inhaler and inhale daily.  09/28/15  Yes Historical Provider, MD    Family History Family History  Problem Relation Age of Onset  . Ulcers Father     bleeding/gastric  .  Cancer Father   . Colon cancer Neg Hx   . Esophageal cancer Neg Hx   . Stomach cancer Neg Hx   . Colon polyps Neg Hx   . Rectal cancer Neg Hx     Social History Social History  Substance Use Topics  . Smoking status: Former Smoker    Packs/day: 2.00    Years: 54.00    Types: Cigarettes    Quit date: 02/25/1999  . Smokeless tobacco: Never Used  . Alcohol use Yes  Comment: only on anniversary      Allergies   Penicillins and Levofloxacin   Review of Systems Review of Systems ROS: Statement: All systems negative except as marked or noted in the HPI; Constitutional: Negative for fever and chills. ; ; Eyes: Negative for eye pain, redness and discharge. ; ; ENMT: Negative for ear pain, hoarseness, sore throat. +nasal congestion, sinus pressure and rhinorrhea. ; ; Cardiovascular: Negative for chest pain, palpitations, diaphoresis, and peripheral edema. ; ; Respiratory: +cough, wheezing, SOB. Negative for stridor. ; ; Gastrointestinal: Negative for nausea, vomiting, diarrhea, abdominal pain, blood in stool, hematemesis, jaundice and rectal bleeding. . ; ; Genitourinary: Negative for dysuria, flank pain and hematuria. ; ; Musculoskeletal: Negative for back pain and neck pain. Negative for swelling and trauma.; ; Skin: Negative for pruritus, rash, abrasions, blisters, bruising and skin lesion.; ; Neuro: Negative for headache, lightheadedness and neck stiffness. Negative for weakness, altered level of consciousness, altered mental status, extremity weakness, paresthesias, involuntary movement, seizure and syncope.      Physical Exam Updated Vital Signs BP 172/77 (BP Location: Left Arm)   Pulse 101   Temp 97.6 F (36.4 C) (Oral)   Resp 16   Ht 5\' 4"  (1.626 m)   Wt 192 lb (87.1 kg)   SpO2 94%   BMI 32.96 kg/m    Patient Vitals for the past 24 hrs:  BP Temp Temp src Pulse Resp SpO2 Height Weight  09/26/16 1600 127/61 - - 118 23 94 % - -  09/26/16 1530 127/58 - - 114 18 95 % - -    09/26/16 1500 (!) 126/50 - - 116 16 93 % - -  09/26/16 1430 142/56 - - (!) 124 15 94 % - -  09/26/16 1400 139/57 - - (!) 51 (!) 32 96 % - -  09/26/16 1330 148/68 - - 98 20 91 % - -  09/26/16 1329 144/68 - - 98 22 92 % - -  09/26/16 1302 - - - - - 94 % - -  09/26/16 1300 144/68 - - 95 21 93 % - -  09/26/16 1238 172/77 97.6 F (36.4 C) Oral 101 16 93 % - -  09/26/16 1237 - - - - - - 5\' 4"  (1.626 m) 192 lb (87.1 kg)      Physical Exam 1255: Physical examination:  Nursing notes reviewed; Vital signs and O2 SAT reviewed;  Constitutional: Well developed, Well nourished, Well hydrated, Uncomfortable appearing.; Head:  Normocephalic, atraumatic; Eyes: EOMI, PERRL, No scleral icterus; ENMT: TM's clear bilat. +edemetous nasal turbinates bilat with clear rhinorrhea. Mouth and pharynx normal, Mucous membranes moist; Neck: Supple, Full range of motion, No lymphadenopathy; Cardiovascular: Tachycardic rate and rhythm, No gallop; Respiratory: Breath sounds diminished & equal bilaterally, insp/exp wheezes bilat. No audible wheezes.  Speaking short sentences with ease, Tachypneic, sitting upright.; Chest: Nontender, Movement normal; Abdomen: Soft, Nontender, Nondistended, Normal bowel sounds; Genitourinary: No CVA tenderness; Extremities: Pulses normal, No tenderness, No edema, No calf edema or asymmetry.; Neuro: AA&Ox3, Major CN grossly intact.  Speech clear. No gross focal motor or sensory deficits in extremities.; Skin: Color normal, Warm, Dry.   ED Treatments / Results  Labs (all labs ordered are listed, but only abnormal results are displayed)   EKG  EKG Interpretation  Date/Time:  Friday September 26 2016 12:40:24 EST Ventricular Rate:  89 PR Interval:    QRS Duration: 90 QT Interval:  340 QTC Calculation: 414 R Axis:   -12 Text Interpretation:  Sinus  rhythm Probable left atrial enlargement Low voltage, precordial leads Abnormal R-wave progression, late transition Baseline wander When compared  with ECG of 11/09/2013 No significant change was found Confirmed by [facility]  MD, Nicholos JohnsKATHLEEN [phone]([id number]) on 09/26/2016 1:03:33 PM       Radiology   Procedures Procedures (including critical care time)  Medications Ordered in ED Medications  methylPREDNISolone sodium succinate (SOLU-MEDROL) 125 mg/2 mL injection 125 mg (not administered)  albuterol (PROVENTIL,VENTOLIN) solution continuous neb (10 mg/hr Nebulization Given 09/26/16 1301)  ipratropium (ATROVENT) nebulizer solution 1 mg (1 mg Nebulization Given 09/26/16 1301)     Initial Impression / Assessment and Plan / ED Course  I have reviewed the triage vital signs and the nursing notes.  Pertinent labs & imaging results that were available during my care of the patient were reviewed by me and considered in my medical decision making (see chart for details).   MDM Reviewed: previous chart, nursing note and vitals Reviewed previous: labs and ECG Interpretation: labs, ECG and x-ray Total time providing critical care: 30-74 minutes. This excludes time spent performing separately reportable procedures and services. Consults: admitting MD   CRITICAL CARE Performed by: Laray AngerMCMANUS,Nova Schmuhl M Total critical care time: 35 minutes Critical care time was exclusive of separately billable procedures and treating other patients. Critical care was necessary to treat or prevent imminent or life-threatening deterioration. Critical care was time spent personally by me on the following activities: development of treatment plan with patient and/or surrogate as well as nursing, discussions with consultants, evaluation of patient's response to treatment, examination of patient, obtaining history from patient or surrogate, ordering and performing treatments and interventions, ordering and review of laboratory studies, ordering and review of radiographic studies, pulse oximetry and re-evaluation of patient's condition.  Results for orders placed or performed  during the hospital encounter of 09/26/16  Basic metabolic panel  Result Value Ref Range   Sodium 135 135 - 145 mmol/L   Potassium 3.1 (L) 3.5 - 5.1 mmol/L   Chloride 102 101 - 111 mmol/L   CO2 23 22 - 32 mmol/L   Glucose, Bld 136 (H) 65 - 99 mg/dL   BUN 24 (H) 6 - 20 mg/dL   Creatinine, Ser 6.041.38 (H) 0.44 - 1.00 mg/dL   Calcium 8.8 (L) 8.9 - 10.3 mg/dL   GFR calc non Af Amer 36 (L) >60 mL/min   GFR calc Af Amer 41 (L) >60 mL/min   Anion gap 10 5 - 15  CBC with Differential  Result Value Ref Range   WBC 18.3 (H) 4.0 - 10.5 K/uL   RBC 4.47 3.87 - 5.11 MIL/uL   Hemoglobin 12.9 12.0 - 15.0 g/dL   HCT 54.039.6 98.136.0 - 19.146.0 %   MCV 88.6 78.0 - 100.0 fL   MCH 28.9 26.0 - 34.0 pg   MCHC 32.6 30.0 - 36.0 g/dL   RDW 47.814.5 29.511.5 - 62.115.5 %   Platelets 233 150 - 400 K/uL   Neutrophils Relative % 91 %   Neutro Abs 16.6 (H) 1.7 - 7.7 K/uL   Lymphocytes Relative 6 %   Lymphs Abs 1.1 0.7 - 4.0 K/uL   Monocytes Relative 3 %   Monocytes Absolute 0.5 0.1 - 1.0 K/uL   Eosinophils Relative 0 %   Eosinophils Absolute 0.0 0.0 - 0.7 K/uL   Basophils Relative 0 %   Basophils Absolute 0.0 0.0 - 0.1 K/uL  D-dimer, quantitative  Result Value Ref Range   D-Dimer, Quant 0.49 0.00 - 0.50 ug/mL-FEU  Troponin  I  Result Value Ref Range   Troponin I <0.03 <0.03 ng/mL   Dg Chest 2 View Result Date: 09/26/2016 CLINICAL DATA:  SOB, Pt reports sob for a few weeks, productive cough, headache. Pt was seen by pcp and was given nebulizer treatments to give self at home without relief HISTORY OF ASTHMA, HTN, COPD, PNEUMONIA, GERD, ANXIETY EXAM: CHEST - 2 VIEW COMPARISON:  09/18/2016 FINDINGS: Somewhat coarse and attenuated bronchovascular markings particularly in the upper lobes, as before. No focal infiltrate or overt edema. Heart size and mediastinal contours are within normal limits. Atheromatous aorta.  No pneumothorax.  No pleural effusion. Visualized bones unremarkable. Surgical clips in the upper abdomen. IMPRESSION:  1. Stable hyperinflation without acute or superimposed abnormality Electronically Signed   By: Corlis Leak M.D.   On: 09/26/2016 15:06    1615:  On arrival: pt sitting upright, tachypneic, tachycardic, Sats 92-94 % R/A, lungs diminished with wheezing. IV solumedrol and hour long neb started. After neb: pt appears more comfortable at rest, less tachypneic, Sats 94-96 % on R/A, lungs continue diminished. Pt ambulated in hallway: pt's O2 Sats dropped to 92-93 % R/A with pt c/o increasing SOB and wheezing, with increasing HR and RR. Pt escorted back to stretcher with O2 Sats dropping further into 80's with increasing coughing. Dx and testing d/w pt and family.  Questions answered.  Verb understanding, agreeable to admit. T/C to [facility]. Adrian Blackwater, case discussed, including:  HPI, pertinent PM/SHx, VS/PE, dx testing, ED course and treatment:  Agreeable to admit, requests to write temporary orders, obtain medical bed to team [facility].     Final Clinical Impressions(s) / ED Diagnoses   Final diagnoses:  None    New Prescriptions New Prescriptions   No medications on file     Samuel Jester, DO 09/29/16 1710

## 2016-09-26 NOTE — H&P (Signed)
History and Physical  Deanna Acosta [id number]:[id number] DOB: 1936-06-04 DOA: 09/26/2016  Referring physician: Dr. Clarene DukeMcManus, ED physician PCP: Neldon LabellaMILLER,LISA LYNN, MD  Outpatient Specialists:   Chief Complaint: Shortness of breath  HPI: Deanna Acosta is a 80 y.o. female with a history of COPD, GERD, hypertension, hyperlipidemia, osteoporosis, stage III chronic kidney disease. Patient seen for shortness of breath that is worse on exertion that is improved with rest. She was seen approximately 8 days ago for similar complaints and was placed on a steroid taper, which she has been taking. As the steroids been tapering off, she has had increasing dyspnea. Prior to that, her primary care doctor had placed her on a steroid burst and her symptoms had returned when she came off the steroids. In my conversation with the patient, she's been using her Advair at best every other day. She does have a cough that is slightly productive with yellowish sputum  Emergency Department Course: Patient received an hour-long nebulizer treatment and Solu-Medrol 125 mg IV. She maintained her oxygen saturation until at the end of ambulating when she dropped to the mid 80s.  Review of Systems:   Pt denies any fevers, chills, nausea, vomiting, diarrhea, constipation, abdominal pain, orthopnea, palpitations, headache, vision changes, lightheadedness, dizziness, melena, rectal bleeding.  Review of systems are otherwise negative  Past Medical History:  Diagnosis Date  . Allergy    seasonal  . Anxiety   . Arthritis   . Asthma    bronchitis  . Blood clot in vein    LEG    YEARS AGO  . Blood dyscrasia    ELEVATED WBC  HX  . Cataract    removed bilaterally   . Clotting disorder (HCC)    clot in leg   . COPD (chronic obstructive pulmonary disease) (HCC)   . Depression   . GERD (gastroesophageal reflux disease)   . Hyperlipidemia   . Hypertension   . Osteoporosis   . Pneumonia    2013  . RSD (reflex sympathetic  dystrophy)   . Shortness of breath   . Ulcer (HCC)    H-pylorie treated   Past Surgical History:  Procedure Laterality Date  . CATARACT EXTRACTION W/PHACO Left 11/29/2013   Procedure: CATARACT EXTRACTION PHACO AND INTRAOCULAR LENS PLACEMENT (IOC);  Surgeon: Loraine LericheMark T. Nile RiggsShapiro, MD;  Location: [facility];  Service: Ophthalmology;  Laterality: Left;  CDE 21.93  . CATARACT EXTRACTION W/PHACO Right 12/13/2013   Procedure: CATARACT EXTRACTION PHACO AND INTRAOCULAR LENS PLACEMENT (IOC);  Surgeon: Loraine LericheMark T. Nile RiggsShapiro, MD;  Location: [facility];  Service: Ophthalmology;  Laterality: Right;  CDE:5.40  . CHOLECYSTECTOMY  2000  . COLONOSCOPY    . ELBOW ARTHROSCOPY     tendonitis/right  . GANGLION CYST EXCISION     left wrist  . JOINT REPLACEMENT Left    knee- left   . KNEE ARTHROSCOPY     right  . KNEE LIGAMENT RECONSTRUCTION     right/water ski accident  . ORIF DISTAL RADIUS FRACTURE  2002   plate  . POLYPECTOMY    . ROTATOR CUFF REPAIR  2009  . TONSILLECTOMY AND ADENOIDECTOMY    . TOTAL ABDOMINAL HYSTERECTOMY  1980   fibroids  . TOTAL KNEE ARTHROPLASTY Left 06/20/2013   Procedure: LEFT TOTAL KNEE ARTHROPLASTY;  Surgeon: Verlee RossettiSteven R Norris, MD;  [facility];  Service: Orthopedics;  Laterality: Left;  . UPPER GASTROINTESTINAL ENDOSCOPY    . YAG LASER APPLICATION Left 12/04/2015   Procedure: YAG LASER APPLICATION;  Surgeon:  Jethro Bolus, MD;  Location: [facility];  Service: Ophthalmology;  Laterality: Left;   Social History:  reports that she quit smoking about 17 years ago. Her smoking use included Cigarettes. She has a 108.00 pack-year smoking history. She has never used smokeless tobacco. She reports that she drinks alcohol. She reports that she does not use drugs. Patient lives at Home  Allergies  Allergen Reactions  . Penicillins Hives    Has patient had a PCN reaction causing immediate rash, facial/tongue/throat swelling, SOB or lightheadedness with hypotension: Yes Has patient had a PCN reaction  causing severe rash involving mucus membranes or skin necrosis: No Has patient had a PCN reaction that required hospitalization No Has patient had a PCN reaction occurring within the last 10 years: No If all of the above answers are "NO", then may proceed with Cephalosporin use. 2  . Levofloxacin Rash    Family History  Problem Relation Age of Onset  . Ulcers Father     bleeding/gastric  . Cancer Father   . Colon cancer Neg Hx   . Esophageal cancer Neg Hx   . Stomach cancer Neg Hx   . Colon polyps Neg Hx   . Rectal cancer Neg Hx       Prior to Admission medications   Medication Sig Start Date End Date Taking? Authorizing Provider  acetaminophen (TYLENOL) 325 MG tablet Take 650 mg by mouth every 6 (six) hours as needed for moderate pain.   Yes Historical Provider, MD  albuterol (PROVENTIL HFA;VENTOLIN HFA) 108 (90 Base) MCG/ACT inhaler Inhale into the lungs every 6 (six) hours as needed for wheezing or shortness of breath.   Yes Historical Provider, MD  alendronate (FOSAMAX) 70 MG tablet Take 70 mg by mouth once a week.  09/10/16  Yes Historical Provider, MD  aspirin EC 81 MG tablet Take 81 mg by mouth daily.   Yes Historical Provider, MD  atorvastatin (LIPITOR) 10 MG tablet  03/05/16  Yes Historical Provider, MD  Cholecalciferol (VITAMIN D) 2000 UNITS CAPS Take 1 capsule by mouth daily.   Yes Historical Provider, MD  cycloSPORINE (RESTASIS) 0.05 % ophthalmic emulsion Place 1 drop into both eyes 2 times daily. 11/27/15  Yes Historical Provider, MD  diltiazem (CARDIZEM CD) 120 MG 24 hr capsule Take 120 mg by mouth daily.  08/01/14  Yes Historical Provider, MD  fexofenadine (ALLEGRA) 180 MG tablet Take 180 mg by mouth daily.   Yes Historical Provider, MD  fluocinonide cream (LIDEX) 0.05 % Apply 1 application topically daily. 11/23/15  Yes Historical Provider, MD  FLUoxetine (PROZAC) 40 MG capsule Take 40 mg by mouth daily before breakfast.    Yes Historical Provider, MD  fluticasone  (FLONASE) 50 MCG/ACT nasal spray Place 1 spray into both nostrils daily as needed for allergies.  06/13/14  Yes Historical Provider, MD  Fluticasone-Salmeterol (ADVAIR) 250-50 MCG/DOSE AEPB Inhale 1 puff into the lungs every 12 (twelve) hours.   Yes Historical Provider, MD  hydrochlorothiazide (HYDRODIURIL) 25 MG tablet Take 1 tablet by mouth daily. 09/06/15  Yes Historical Provider, MD  losartan (COZAAR) 100 MG tablet Take 100 mg by mouth every evening.    Yes Historical Provider, MD  lovastatin (MEVACOR) 40 MG tablet Take 40 mg by mouth every evening.   Yes Historical Provider, MD  Multiple Vitamin (MULTIVITAMIN) tablet Take 1 tablet by mouth daily.   Yes Historical Provider, MD  omeprazole (PRILOSEC) 40 MG capsule Take 40 mg by mouth daily.    Yes Historical Provider,  MD  predniSONE (STERAPRED UNI-PAK 21 TAB) 10 MG (21) TBPK tablet Take 6 tabs by mouth daily  for 2 days, then 5 tabs for 2 days, then 4 tabs for 2 days, then 3 tabs for 2 days, 2 tabs for 2 days, then 1 tab by mouth daily for 2 days 09/18/16  Yes Linwood DibblesJon Knapp, MD  pseudoephedrine (SUDAFED 12 HOUR) 120 MG 12 hr tablet Take 1 tablet (120 mg total) by mouth every 12 (twelve) hours. 09/18/16  Yes Linwood DibblesJon Knapp, MD  SPIRIVA HANDIHALER 18 MCG inhalation capsule Place 18 mcg into inhaler and inhale daily.  09/28/15  Yes Historical Provider, MD    Physical Exam: BP (!) 130/51 (BP Location: Right Arm)   Pulse (!) 109   Temp 98 F (36.7 C) (Oral)   Resp 25   Ht 5' 4.5" (1.638 m)   Wt 88 kg (194 lb)   SpO2 98%   BMI 32.79 kg/m   General: Elderly Caucasian female. Awake and alert and oriented x3. No acute cardiopulmonary distress.  HEENT: Normocephalic atraumatic.  Right and left ears normal in appearance.  Pupils equal, round, reactive to light. Extraocular muscles are intact. Sclerae anicteric and noninjected.  Moist mucosal membranes. No mucosal lesions.  Neck: Neck supple without lymphadenopathy. No carotid bruits. No masses palpated.    Cardiovascular: Regular rate with normal S1-S2 sounds. No murmurs, rubs, gallops auscultated. No JVD.  Respiratory: Coarse wheezes throughout, worse on the left. Rales in the left base with egophony Abdomen: Soft, nontender, nondistended. Active bowel sounds. No masses or hepatosplenomegaly  Skin: No rashes, lesions, or ulcerations.  Dry, warm to touch. 2+ dorsalis pedis and radial pulses. Musculoskeletal: No calf or leg pain. All major joints not erythematous nontender.  No upper or lower joint deformation.  Good ROM.  No contractures  Psychiatric: Intact judgment and insight. Pleasant and cooperative. Neurologic: No focal neurological deficits. Strength is 5/5 and symmetric in upper and lower extremities.  Cranial nerves II through XII are grossly intact.           Labs on Admission: I have personally reviewed following labs and imaging studies  CBC:  Recent Labs Lab 09/26/16 1324  WBC 18.3*  NEUTROABS 16.6*  HGB 12.9  HCT 39.6  MCV 88.6  PLT 233   Basic Metabolic Panel:  Recent Labs Lab 09/26/16 1324  NA 135  K 3.1*  CL 102  CO2 23  GLUCOSE 136*  BUN 24*  CREATININE 1.38*  CALCIUM 8.8*   GFR: Estimated Creatinine Clearance: 36.4 mL/min (by C-G formula based on SCr of 1.38 mg/dL (H)). Liver Function Tests: No results for input(s): AST, ALT, ALKPHOS, BILITOT, PROT, ALBUMIN in the last 168 hours. No results for input(s): LIPASE, AMYLASE in the last 168 hours. No results for input(s): AMMONIA in the last 168 hours. Coagulation Profile: No results for input(s): INR, PROTIME in the last 168 hours. Cardiac Enzymes:  Recent Labs Lab 09/26/16 1324  TROPONINI <0.03   BNP (last 3 results) No results for input(s): PROBNP in the last 8760 hours. HbA1C: No results for input(s): HGBA1C in the last 72 hours. CBG: No results for input(s): GLUCAP in the last 168 hours. Lipid Profile: No results for input(s): CHOL, HDL, LDLCALC, TRIG, CHOLHDL, LDLDIRECT in the last 72  hours. Thyroid Function Tests: No results for input(s): TSH, T4TOTAL, FREET4, T3FREE, THYROIDAB in the last 72 hours. Anemia Panel: No results for input(s): VITAMINB12, FOLATE, FERRITIN, TIBC, IRON, RETICCTPCT in the last 72 hours. Urine analysis:  Component Value Date/Time   COLORURINE YELLOW 06/21/2013 1816   APPEARANCEUR CLEAR 06/21/2013 1816   LABSPEC 1.023 06/21/2013 1816   PHURINE 5.5 06/21/2013 1816   GLUCOSEU 100 (A) 06/21/2013 1816   HGBUR NEGATIVE 06/21/2013 1816   BILIRUBINUR SMALL (A) 06/21/2013 1816   KETONESUR NEGATIVE 06/21/2013 1816   PROTEINUR NEGATIVE 06/21/2013 1816   UROBILINOGEN 1.0 06/21/2013 1816   NITRITE NEGATIVE 06/21/2013 1816   LEUKOCYTESUR NEGATIVE 06/21/2013 1816   Sepsis Labs: @LABRCNTIP (procalcitonin:4,lacticidven:4) )No results found for this or any previous visit (from the past 240 hour(s)).   Radiological Exams on Admission: Dg Chest 2 View  Result Date: 09/26/2016 CLINICAL DATA:  SOB, Pt reports sob for a few weeks, productive cough, headache. Pt was seen by pcp and was given nebulizer treatments to give self at home without relief HISTORY OF ASTHMA, HTN, COPD, PNEUMONIA, GERD, ANXIETY EXAM: CHEST - 2 VIEW COMPARISON:  09/18/2016 FINDINGS: Somewhat coarse and attenuated bronchovascular markings particularly in the upper lobes, as before. No focal infiltrate or overt edema. Heart size and mediastinal contours are within normal limits. Atheromatous aorta.  No pneumothorax.  No pleural effusion. Visualized bones unremarkable. Surgical clips in the upper abdomen. IMPRESSION: 1. Stable hyperinflation without acute or superimposed abnormality Electronically Signed   By: Corlis Leak M.D.   On: 09/26/2016 15:06    EKG: Independently reviewed. Sinus rhythm, left atrial enlargement, late R-wave transition. No acute ST changes.  Assessment/Plan: Principal Problem:   COPD with acute exacerbation (HCC) Active Problems:   Hypoxemia   HTN (hypertension)    Chronic kidney disease, stage III (moderate)    This patient was discussed with the ED physician, including pertinent vitals, physical exam findings, labs, and imaging.  We also discussed care given by the ED provider.  #1 COPD with acute exacerbation  Observation Antibiotics: Azithromycin Xopenex every 4 hours Continue inhaled steroids and LA bronchodilator Solu-Medrol 60 mg IV every 6 hours - patient may need long slow taper given that she may be having rebound symptoms. Did discuss with patient need to take Advair every day. Mucinex #2 hypoxemia  Oxygen as needed #3 hypertension  Continue home medications #4 chronic kidney disease stage III  We'll be cautious with renally dosing medications  DVT prophylaxis: Lovenox Consultants: None Code Status: Full code Family Communication: Husband in the room  Disposition Plan: Patient should be able to return home following observation - anticipate being able to go home tomorrow   Levie Heritage, DO [facility] Pager [phone]  If 7PM-7AM, please contact night-coverage [email or web address] Password [email or web address]

## 2016-09-26 NOTE — ED Triage Notes (Signed)
Pt reports sob for a few weeks, productive cough, headache.  Pt was seen by pcp and was given nebulizer treatments to give self at home without relief. Denies cp.  Pt refused wheelchair back to room.

## 2016-09-26 NOTE — ED Notes (Signed)
Pulse Oximetry while ambulating: 92-93

## 2016-09-26 NOTE — ED Notes (Signed)
Attempted to call report, RN unavailable.

## 2016-09-27 DIAGNOSIS — J441 Chronic obstructive pulmonary disease with (acute) exacerbation: Secondary | ICD-10-CM | POA: Diagnosis not present

## 2016-09-27 LAB — CBC
HCT: 39.4 % (ref 36.0–46.0)
Hemoglobin: 12.7 g/dL (ref 12.0–15.0)
MCH: 28.3 pg (ref 26.0–34.0)
MCHC: 32.2 g/dL (ref 30.0–36.0)
MCV: 87.9 fL (ref 78.0–100.0)
Platelets: 285 10*3/uL (ref 150–400)
RBC: 4.48 MIL/uL (ref 3.87–5.11)
RDW: 14.6 % (ref 11.5–15.5)
WBC: 20 10*3/uL — ABNORMAL HIGH (ref 4.0–10.5)

## 2016-09-27 LAB — BASIC METABOLIC PANEL
Anion gap: 13 (ref 5–15)
BUN: 28 mg/dL — ABNORMAL HIGH (ref 6–20)
CO2: 24 mmol/L (ref 22–32)
Calcium: 8.8 mg/dL — ABNORMAL LOW (ref 8.9–10.3)
Chloride: 100 mmol/L — ABNORMAL LOW (ref 101–111)
Creatinine, Ser: 1.52 mg/dL — ABNORMAL HIGH (ref 0.44–1.00)
GFR calc Af Amer: 37 mL/min — ABNORMAL LOW (ref >60)
GFR calc non Af Amer: 32 mL/min — ABNORMAL LOW (ref >60)
Glucose, Bld: 156 mg/dL — ABNORMAL HIGH (ref 65–99)
Potassium: 2.9 mmol/L — ABNORMAL LOW (ref 3.5–5.1)
Sodium: 137 mmol/L (ref 135–145)

## 2016-09-27 MED ORDER — LEVALBUTEROL HCL 0.63 MG/3ML IN NEBU
0.6300 mg | INHALATION_SOLUTION | Freq: Four times a day (QID) | RESPIRATORY_TRACT | Status: DC
  Administered 2016-09-27 – 2016-09-28 (×4): 0.63 mg via RESPIRATORY_TRACT
  Filled 2016-09-27 (×4): qty 3

## 2016-09-27 NOTE — Care Management Obs Status (Signed)
MEDICARE OBSERVATION STATUS NOTIFICATION   Patient Details  Name: Deanna ChampagneGeraldine G Kohlbeck MRN: [id number] Date of Birth: 1936-01-17   Medicare Observation Status Notification Given:  Yes    Fuller PlanWelborn, Eun Vermeer M, RN 09/27/2016, 3:48 PM

## 2016-09-27 NOTE — Progress Notes (Signed)
PROGRESS NOTE    Deanna Acosta  UJW:[id number] DOB: 01/05/1936 DOA: 09/26/2016 PCP: Neldon LabellaMILLER,LISA LYNN, MD   Brief Narrative:  80 y.o. female with a history of COPD, GERD, hypertension, hyperlipidemia, osteoporosis, stage III chronic kidney disease. Patient seen for shortness of breath that is worse on exertion that is improved with rest. She was seen approximately 8 days ago for similar complaints and was placed on a steroid taper, which she has been taking. As the steroids been tapering off, she has had increasing dyspnea. Prior to that, her primary care doctor had placed her on a steroid burst and her symptoms had returned when she came off the steroids.   Assessment & Plan:   Principal Problem:   COPD with acute exacerbation (HCC) - continue current medical regimen  Active Problems:   Hypoxemia - resolved pt on room air.    HTN (hypertension) - stable    Chronic kidney disease, stage III (moderate) - stable  DVT prophylaxis: Lovenox Code Status: Full Family Communication: None at bedside Disposition Plan: Pending improvement in condition   Consultants:   None   Procedures: None   Antimicrobials: Azithromycin   Subjective: Pt has no new complaints. States she feels better. Is looking forward to going home  Objective: Vitals:   09/26/16 2331 09/27/16 0414 09/27/16 0658 09/27/16 0830  BP:      Pulse:   96   Resp:   19   Temp:   97.4 F (36.3 C)   TempSrc:   Oral   SpO2: 96% 95% 95% 93%  Weight:      Height:        Intake/Output Summary (Last 24 hours) at 09/27/16 1410 Last data filed at 09/27/16 1300  Gross per 24 hour  Intake              730 ml  Output                0 ml  Net              730 ml   Filed Weights   09/26/16 1237 09/26/16 1848  Weight: 87.1 kg (192 lb) 88 kg (194 lb)    Examination:  General exam: Appears calm and comfortable, in nad. Respiratory system: equal chest rise, exp wheezes, prolonged exp phase, no  rhales Cardiovascular system: S1 & S2 heard, RRR. No JVD, murmurs, rubs Gastrointestinal system: Abdomen is nondistended, soft and nontender. No organomegaly or masses felt. Normal bowel sounds heard. Central nervous system: Alert and oriented. No focal neurological deficits. Extremities: Symmetric 5 x 5 power. Skin: No rashes, lesions or ulcers Psychiatry: Judgement and insight appear normal. Mood & affect appropriate.   Data Reviewed: I have personally reviewed following labs and imaging studies  CBC:  Recent Labs Lab 09/26/16 1324 09/27/16 0615  WBC 18.3* 20.0*  NEUTROABS 16.6*  --   HGB 12.9 12.7  HCT 39.6 39.4  MCV 88.6 87.9  PLT 233 285   Basic Metabolic Panel:  Recent Labs Lab 09/26/16 1324 09/27/16 0615  NA 135 137  K 3.1* 2.9*  CL 102 100*  CO2 23 24  GLUCOSE 136* 156*  BUN 24* 28*  CREATININE 1.38* 1.52*  CALCIUM 8.8* 8.8*   GFR: Estimated Creatinine Clearance: 33.1 mL/min (by C-G formula based on SCr of 1.52 mg/dL (H)). Liver Function Tests: No results for input(s): AST, ALT, ALKPHOS, BILITOT, PROT, ALBUMIN in the last 168 hours. No results for input(s): LIPASE, AMYLASE in the last 168  hours. No results for input(s): AMMONIA in the last 168 hours. Coagulation Profile: No results for input(s): INR, PROTIME in the last 168 hours. Cardiac Enzymes:  Recent Labs Lab 09/26/16 1324  TROPONINI <0.03   BNP (last 3 results) No results for input(s): PROBNP in the last 8760 hours. HbA1C: No results for input(s): HGBA1C in the last 72 hours. CBG: No results for input(s): GLUCAP in the last 168 hours. Lipid Profile: No results for input(s): CHOL, HDL, LDLCALC, TRIG, CHOLHDL, LDLDIRECT in the last 72 hours. Thyroid Function Tests: No results for input(s): TSH, T4TOTAL, FREET4, T3FREE, THYROIDAB in the last 72 hours. Anemia Panel: No results for input(s): VITAMINB12, FOLATE, FERRITIN, TIBC, IRON, RETICCTPCT in the last 72 hours. Sepsis Labs: No results for  input(s): PROCALCITON, LATICACIDVEN in the last 168 hours.  No results found for this or any previous visit (from the past 240 hour(s)).   Radiology Studies: Dg Chest 2 View  Result Date: 09/26/2016 CLINICAL DATA:  SOB, Pt reports sob for a few weeks, productive cough, headache. Pt was seen by pcp and was given nebulizer treatments to give self at home without relief HISTORY OF ASTHMA, HTN, COPD, PNEUMONIA, GERD, ANXIETY EXAM: CHEST - 2 VIEW COMPARISON:  09/18/2016 FINDINGS: Somewhat coarse and attenuated bronchovascular markings particularly in the upper lobes, as before. No focal infiltrate or overt edema. Heart size and mediastinal contours are within normal limits. Atheromatous aorta.  No pneumothorax.  No pleural effusion. Visualized bones unremarkable. Surgical clips in the upper abdomen. IMPRESSION: 1. Stable hyperinflation without acute or superimposed abnormality Electronically Signed   By: Corlis Leak  Hassell M.D.   On: 09/26/2016 15:06   Scheduled Meds: . aspirin EC  81 mg Oral Daily  . atorvastatin  10 mg Oral q1800  . azithromycin  500 mg Intravenous Q24H  . cycloSPORINE  1 drop Both Eyes BID  . diltiazem  120 mg Oral Daily  . enoxaparin (LOVENOX) injection  40 mg Subcutaneous Q24H  . fluocinonide cream  1 application Topical Daily  . FLUoxetine  40 mg Oral QAC breakfast  . guaiFENesin  600 mg Oral BID  . hydrochlorothiazide  25 mg Oral Daily  . levalbuterol  0.63 mg Nebulization Q6H  . losartan  100 mg Oral QPM  . methylPREDNISolone (SOLU-MEDROL) injection  60 mg Intravenous Q6H  . mometasone-formoterol  2 puff Inhalation BID  . pantoprazole  40 mg Oral Daily  . tiotropium  18 mcg Inhalation Daily   Continuous Infusions:   LOS: 0 days    Time spent: > 35 minutes  Penny PiaVEGA, Mechele Kittleson, MD [facility] Pager [phone]  If 7PM-7AM, please contact night-coverage [email or web address] Password [email or web address] 09/27/2016, 2:10 PM

## 2016-09-28 DIAGNOSIS — J441 Chronic obstructive pulmonary disease with (acute) exacerbation: Secondary | ICD-10-CM | POA: Diagnosis not present

## 2016-09-28 MED ORDER — AZITHROMYCIN 500 MG PO TABS: 500.0000 mg | ORAL_TABLET | Freq: Every day | ORAL | 0 refills | Status: DC

## 2016-09-28 MED ORDER — PREDNISONE 10 MG (21) PO TBPK: ORAL_TABLET | ORAL | 0 refills | Status: DC

## 2016-09-28 MED ORDER — POTASSIUM CHLORIDE CRYS ER 20 MEQ PO TBCR
40.0000 meq | EXTENDED_RELEASE_TABLET | Freq: Once | ORAL | Status: AC
  Administered 2016-09-28: 40 meq via ORAL
  Filled 2016-09-28: qty 2

## 2016-09-28 NOTE — Discharge Summary (Signed)
Physician Discharge Summary  Deanna Acosta JWJ:[id number] DOB: 02-11-1936 DOA: 09/26/2016  PCP: Neldon LabellaMILLER,LISA LYNN, MD  Admit date: 09/26/2016 Discharge date: 09/28/2016  Time spent: > 35 minutes  Recommendations for Outpatient Follow-up:   1.  Monitor Serum creatinine  2. Reassess potassium levels   Discharge Diagnoses:  Principal Problem:   COPD with acute exacerbation (HCC) Active Problems:   Hypoxemia   HTN (hypertension)   Chronic kidney disease, stage III (moderate)   Discharge Condition: stable  Diet recommendation: carb modified diet  Filed Weights   09/26/16 1237 09/26/16 1848  Weight: 87.1 kg (192 lb) 88 kg (194 lb)    History of present illness:  80 y.o. female with a history of COPD, GERD, hypertension, hyperlipidemia, osteoporosis, stage III chronic kidney disease.  Who presented with copd exacerbation  Hospital Course:  Copd exacerbation - will discharge on antibiotics and prednisone taper - continue home maintenance dose\  Leukocytosis - secondary to steroids pt feels better and is requesting discharge  Hypokalemia - replace orally. Recommended patient f/u with pcp for further evaluation and recommendations.  For known medical conditions patient to continue home medication regimen listed below.  Procedures:  None  Consultations:  none  Discharge Exam: Vitals:   09/27/16 1524 09/27/16 2155  BP: (!) 146/50 (!) 161/69  Pulse: 95 91  Resp: 20 20  Temp:  97.6 F (36.4 C)    General: Pt in nad, alert and awake Cardiovascular: rrr, no rubs Respiratory: equal chest rise, no wheezes, prolonged expiratory phase  Discharge Instructions    Current Discharge Medication List    START taking these medications   Details  azithromycin (ZITHROMAX) 500 MG tablet Take 1 tablet (500 mg total) by mouth daily. Qty: 3 tablet, Refills: 0      CONTINUE these medications which have CHANGED   Details  predniSONE (STERAPRED UNI-PAK 21 TAB) 10  MG (21) TBPK tablet Take 6 tabs by mouth daily  for 2 days, then 5 tabs for 2 days, then 4 tabs for 2 days, then 3 tabs for 2 days, 2 tabs for 2 days, then 1 tab by mouth daily for 2 days Qty: 42 tablet, Refills: 0      CONTINUE these medications which have NOT CHANGED   Details  acetaminophen (TYLENOL) 325 MG tablet Take 650 mg by mouth every 6 (six) hours as needed for moderate pain.    albuterol (PROVENTIL HFA;VENTOLIN HFA) 108 (90 Base) MCG/ACT inhaler Inhale into the lungs every 6 (six) hours as needed for wheezing or shortness of breath.    alendronate (FOSAMAX) 70 MG tablet Take 70 mg by mouth once a week.  Refills: 0    aspirin EC 81 MG tablet Take 81 mg by mouth daily.    atorvastatin (LIPITOR) 10 MG tablet Refills: 0    Cholecalciferol (VITAMIN D) 2000 UNITS CAPS Take 1 capsule by mouth daily.    cycloSPORINE (RESTASIS) 0.05 % ophthalmic emulsion Place 1 drop into both eyes 2 times daily.    diltiazem (CARDIZEM CD) 120 MG 24 hr capsule Take 120 mg by mouth daily.  Refills: 1    fexofenadine (ALLEGRA) 180 MG tablet Take 180 mg by mouth daily.    fluocinonide cream (LIDEX) 0.05 % Apply 1 application topically daily. Refills: 0    FLUoxetine (PROZAC) 40 MG capsule Take 40 mg by mouth daily before breakfast.     fluticasone (FLONASE) 50 MCG/ACT nasal spray Place 1 spray into both nostrils daily as needed for allergies.  Refills: 0    Fluticasone-Salmeterol (ADVAIR) 250-50 MCG/DOSE AEPB Inhale 1 puff into the lungs every 12 (twelve) hours.    hydrochlorothiazide (HYDRODIURIL) 25 MG tablet Take 1 tablet by mouth daily. Refills: 0    losartan (COZAAR) 100 MG tablet Take 100 mg by mouth every evening.     lovastatin (MEVACOR) 40 MG tablet Take 40 mg by mouth every evening.    Multiple Vitamin (MULTIVITAMIN) tablet Take 1 tablet by mouth daily.    omeprazole (PRILOSEC) 40 MG capsule Take 40 mg by mouth daily.     SPIRIVA HANDIHALER 18 MCG inhalation capsule Place 18  mcg into inhaler and inhale daily.  Refills: 0      STOP taking these medications     pseudoephedrine (SUDAFED 12 HOUR) 120 MG 12 hr tablet        Allergies  Allergen Reactions  . Penicillins Hives    Has patient had a PCN reaction causing immediate rash, facial/tongue/throat swelling, SOB or lightheadedness with hypotension: Yes Has patient had a PCN reaction causing severe rash involving mucus membranes or skin necrosis: No Has patient had a PCN reaction that required hospitalization No Has patient had a PCN reaction occurring within the last 10 years: No If all of the above answers are "NO", then may proceed with Cephalosporin use. 2  . Levofloxacin Rash      The results of significant diagnostics from this hospitalization (including imaging, microbiology, ancillary and laboratory) are listed below for reference.    Significant Diagnostic Studies: Dg Chest 2 View  Result Date: 09/26/2016 CLINICAL DATA:  SOB, Pt reports sob for a few weeks, productive cough, headache. Pt was seen by pcp and was given nebulizer treatments to give self at home without relief HISTORY OF ASTHMA, HTN, COPD, PNEUMONIA, GERD, ANXIETY EXAM: CHEST - 2 VIEW COMPARISON:  09/18/2016 FINDINGS: Somewhat coarse and attenuated bronchovascular markings particularly in the upper lobes, as before. No focal infiltrate or overt edema. Heart size and mediastinal contours are within normal limits. Atheromatous aorta.  No pneumothorax.  No pleural effusion. Visualized bones unremarkable. Surgical clips in the upper abdomen. IMPRESSION: 1. Stable hyperinflation without acute or superimposed abnormality Electronically Signed   By: Corlis Leak  Hassell M.D.   On: 09/26/2016 15:06   Dg Chest 2 View  Result Date: 09/18/2016 CLINICAL DATA:  Cough for 1 week with shortness of Breath EXAM: CHEST  2 VIEW COMPARISON:  11/09/2015 FINDINGS: Cardiac shadow is stable. Aortic calcifications are again noted. The lungs are well-aerated without  focal infiltrate or sizable effusion. No acute bony abnormality is seen. IMPRESSION: No active cardiopulmonary disease. Electronically Signed   By: Alcide CleverMark  Lukens M.D.   On: 09/18/2016 14:46    Microbiology: No results found for this or any previous visit (from the past 240 hour(s)).   Labs: Basic Metabolic Panel:  Recent Labs Lab 09/26/16 1324 09/27/16 0615  NA 135 137  K 3.1* 2.9*  CL 102 100*  CO2 23 24  GLUCOSE 136* 156*  BUN 24* 28*  CREATININE 1.38* 1.52*  CALCIUM 8.8* 8.8*   Liver Function Tests: No results for input(s): AST, ALT, ALKPHOS, BILITOT, PROT, ALBUMIN in the last 168 hours. No results for input(s): LIPASE, AMYLASE in the last 168 hours. No results for input(s): AMMONIA in the last 168 hours. CBC:  Recent Labs Lab 09/26/16 1324 09/27/16 0615  WBC 18.3* 20.0*  NEUTROABS 16.6*  --   HGB 12.9 12.7  HCT 39.6 39.4  MCV 88.6 87.9  PLT 233  285   Cardiac Enzymes:  Recent Labs Lab 09/26/16 1324  TROPONINI <0.03   BNP: BNP (last 3 results) No results for input(s): BNP in the last 8760 hours.  ProBNP (last 3 results) No results for input(s): PROBNP in the last 8760 hours.  CBG: No results for input(s): GLUCAP in the last 168 hours.   Signed:  Penny Pia MD.  [facility] 09/28/2016, 11:44 AM

## 2016-10-11 ENCOUNTER — Emergency Department (HOSPITAL_COMMUNITY)
Admission: EM | Admit: 2016-10-11 | Discharge: 2016-10-11 | Disposition: A | Discharge status: Home / Self Care | Payer: Medicare Other | Attending: Emergency Medicine | Admitting: Emergency Medicine

## 2016-10-11 ENCOUNTER — Emergency Department (HOSPITAL_COMMUNITY): Payer: Medicare Other

## 2016-10-11 ENCOUNTER — Encounter (HOSPITAL_COMMUNITY): Payer: Self-pay

## 2016-10-11 DIAGNOSIS — I129 Hypertensive chronic kidney disease with stage 1 through stage 4 chronic kidney disease, or unspecified chronic kidney disease: Secondary | ICD-10-CM | POA: Diagnosis not present

## 2016-10-11 DIAGNOSIS — Z87891 Personal history of nicotine dependence: Secondary | ICD-10-CM | POA: Diagnosis not present

## 2016-10-11 DIAGNOSIS — Z96652 Presence of left artificial knee joint: Secondary | ICD-10-CM | POA: Insufficient documentation

## 2016-10-11 DIAGNOSIS — J449 Chronic obstructive pulmonary disease, unspecified: Secondary | ICD-10-CM | POA: Diagnosis not present

## 2016-10-11 DIAGNOSIS — N183 Chronic kidney disease, stage 3 (moderate): Secondary | ICD-10-CM | POA: Insufficient documentation

## 2016-10-11 DIAGNOSIS — Z79899 Other long term (current) drug therapy: Secondary | ICD-10-CM | POA: Insufficient documentation

## 2016-10-11 DIAGNOSIS — R252 Cramp and spasm: Secondary | ICD-10-CM | POA: Diagnosis present

## 2016-10-11 DIAGNOSIS — Z7982 Long term (current) use of aspirin: Secondary | ICD-10-CM | POA: Diagnosis not present

## 2016-10-11 LAB — HEPATIC FUNCTION PANEL
ALT: 24 U/L (ref 14–54)
AST: 17 U/L (ref 15–41)
Albumin: 3.5 g/dL (ref 3.5–5.0)
Alkaline Phosphatase: 102 U/L (ref 38–126)
Bilirubin, Direct: 0.1 mg/dL (ref 0.1–0.5)
Indirect Bilirubin: 0.7 mg/dL (ref 0.3–0.9)
Total Bilirubin: 0.8 mg/dL (ref 0.3–1.2)
Total Protein: 6.8 g/dL (ref 6.5–8.1)

## 2016-10-11 LAB — BASIC METABOLIC PANEL
Anion gap: 11 (ref 5–15)
BUN: 20 mg/dL (ref 6–20)
CO2: 21 mmol/L — ABNORMAL LOW (ref 22–32)
Calcium: 9.2 mg/dL (ref 8.9–10.3)
Chloride: 103 mmol/L (ref 101–111)
Creatinine, Ser: 1.31 mg/dL — ABNORMAL HIGH (ref 0.44–1.00)
GFR calc Af Amer: 44 mL/min — ABNORMAL LOW (ref >60)
GFR calc non Af Amer: 38 mL/min — ABNORMAL LOW (ref >60)
Glucose, Bld: 115 mg/dL — ABNORMAL HIGH (ref 65–99)
Potassium: 4.7 mmol/L (ref 3.5–5.1)
Sodium: 135 mmol/L (ref 135–145)

## 2016-10-11 LAB — URINALYSIS, ROUTINE W REFLEX MICROSCOPIC
Bilirubin Urine: NEGATIVE
Glucose, UA: NEGATIVE mg/dL
Hgb urine dipstick: NEGATIVE
Ketones, ur: NEGATIVE mg/dL
Leukocytes, UA: NEGATIVE
Nitrite: NEGATIVE
Protein, ur: NEGATIVE mg/dL
Specific Gravity, Urine: 1.02 (ref 1.005–1.030)
pH: 5 (ref 5.0–8.0)

## 2016-10-11 LAB — CBC
HCT: 40.2 % (ref 36.0–46.0)
Hemoglobin: 13 g/dL (ref 12.0–15.0)
MCH: 28.8 pg (ref 26.0–34.0)
MCHC: 32.3 g/dL (ref 30.0–36.0)
MCV: 88.9 fL (ref 78.0–100.0)
Platelets: 250 10*3/uL (ref 150–400)
RBC: 4.52 MIL/uL (ref 3.87–5.11)
RDW: 15.8 % — ABNORMAL HIGH (ref 11.5–15.5)
WBC: 18.1 10*3/uL — ABNORMAL HIGH (ref 4.0–10.5)

## 2016-10-11 LAB — I-STAT TROPONIN, ED: Troponin i, poc: 0 ng/mL (ref 0.00–0.08)

## 2016-10-11 LAB — LIPASE, BLOOD: Lipase: 40 U/L (ref 11–51)

## 2016-10-11 MED ORDER — CYCLOBENZAPRINE HCL 5 MG PO TABS: 5.0000 mg | ORAL_TABLET | Freq: Three times a day (TID) | ORAL | 0 refills | Status: DC | PRN

## 2016-10-11 MED ORDER — CYCLOBENZAPRINE HCL 10 MG PO TABS
5.0000 mg | ORAL_TABLET | Freq: Once | ORAL | Status: AC
  Administered 2016-10-11: 5 mg via ORAL
  Filled 2016-10-11: qty 1

## 2016-10-11 NOTE — ED Provider Notes (Signed)
[facility]-EMERGENCY DEPT Provider Note   CSN: [id number] Arrival date & time: 10/11/16  1519     History   Chief Complaint Chief Complaint  Patient presents with  . Abdominal Cramping  . Tremors  . Nasal Congestion    HPI Deanna Acosta is a 81 y.o. female.  Patient complains of muscular cramping in the hands, arms, legs 4-3 days. Her potassium was low in the recent past and she was prescribed potassium. Review systems positive for nasal and head congestion. No chest pain, dyspnea, diaphoresis, nausea, vomiting, diarrhea, dysuria.      Past Medical History:  Diagnosis Date  . Allergy    seasonal  . Anxiety   . Arthritis   . Asthma    bronchitis  . Blood clot in vein    LEG    YEARS AGO  . Blood dyscrasia    ELEVATED WBC  HX  . Cataract    removed bilaterally   . Clotting disorder (HCC)    clot in leg   . COPD (chronic obstructive pulmonary disease) (HCC)   . Depression   . GERD (gastroesophageal reflux disease)   . Hyperlipidemia   . Hypertension   . Osteoporosis   . Pneumonia    2013  . RSD (reflex sympathetic dystrophy)   . Shortness of breath   . Ulcer (HCC)    H-pylorie treated    Patient Active Problem List   Diagnosis Date Noted  . COPD with acute exacerbation (HCC) 09/26/2016  . Headache(784.0) 09/24/2012  . COPD exacerbation (HCC) 09/23/2012  . Hypoxemia 09/23/2012  . Vomiting 09/23/2012  . Dehydration 09/23/2012  . HTN (hypertension) 09/23/2012  . Chronic kidney disease, stage III (moderate) 09/23/2012    Past Surgical History:  Procedure Laterality Date  . CATARACT EXTRACTION W/PHACO Left 11/29/2013   Procedure: CATARACT EXTRACTION PHACO AND INTRAOCULAR LENS PLACEMENT (IOC);  Surgeon: Loraine Leriche T. Nile Riggs, MD;  Location: [facility];  Service: Ophthalmology;  Laterality: Left;  CDE 21.93  . CATARACT EXTRACTION W/PHACO Right 12/13/2013   Procedure: CATARACT EXTRACTION PHACO AND INTRAOCULAR LENS PLACEMENT (IOC);  Surgeon: Loraine Leriche T. Nile Riggs, MD;   Location: [facility];  Service: Ophthalmology;  Laterality: Right;  CDE:5.40  . CHOLECYSTECTOMY  2000  . COLONOSCOPY    . ELBOW ARTHROSCOPY     tendonitis/right  . GANGLION CYST EXCISION     left wrist  . JOINT REPLACEMENT Left    knee- left   . KNEE ARTHROSCOPY     right  . KNEE LIGAMENT RECONSTRUCTION     right/water ski accident  . ORIF DISTAL RADIUS FRACTURE  2002   plate  . POLYPECTOMY    . ROTATOR CUFF REPAIR  2009  . TONSILLECTOMY AND ADENOIDECTOMY    . TOTAL ABDOMINAL HYSTERECTOMY  1980   fibroids  . TOTAL KNEE ARTHROPLASTY Left 06/20/2013   Procedure: LEFT TOTAL KNEE ARTHROPLASTY;  Surgeon: Verlee Rossetti, MD;  Location: [facility] OR;  Service: Orthopedics;  Laterality: Left;  . UPPER GASTROINTESTINAL ENDOSCOPY    . YAG LASER APPLICATION Left 12/04/2015   Procedure: YAG LASER APPLICATION;  Surgeon: Jethro Bolus, MD;  Location: [facility];  Service: Ophthalmology;  Laterality: Left;    OB History    No data available       Home Medications    Prior to Admission medications   Medication Sig Start Date End Date Taking? Authorizing Provider  albuterol (PROVENTIL HFA;VENTOLIN HFA) 108 (90 Base) MCG/ACT inhaler Inhale into the lungs every 6 (six) hours as  needed for wheezing or shortness of breath.   Yes Historical Provider, MD  alendronate (FOSAMAX) 70 MG tablet Take 70 mg by mouth once a week.  09/10/16  Yes Historical Provider, MD  aspirin 325 MG tablet Take 325 mg by mouth every 6 (six) hours as needed for moderate pain or headache.   Yes Historical Provider, MD  Cholecalciferol 1000 units capsule Take 1,000 Units by mouth daily.   Yes Historical Provider, MD  cycloSPORINE (RESTASIS) 0.05 % ophthalmic emulsion Place 1 drop into both eyes 2 times daily as needed for dry eyes 11/27/15  Yes Historical Provider, MD  diltiazem (CARDIZEM CD) 120 MG 24 hr capsule Take 120 mg by mouth daily.  08/01/14  Yes Historical Provider, MD  fexofenadine (ALLEGRA) 180 MG tablet Take 180 mg by mouth  daily.   Yes Historical Provider, MD  FLUoxetine (PROZAC) 40 MG capsule Take 40 mg by mouth daily before breakfast.    Yes Historical Provider, MD  fluticasone (FLONASE) 50 MCG/ACT nasal spray Place 1 spray into both nostrils daily as needed for allergies.  06/13/14  Yes Historical Provider, MD  Fluticasone-Salmeterol (ADVAIR) 250-50 MCG/DOSE AEPB Inhale 1 puff into the lungs every 12 (twelve) hours.   Yes Historical Provider, MD  hydrochlorothiazide (HYDRODIURIL) 25 MG tablet Take 1 tablet by mouth daily. 09/06/15  Yes Historical Provider, MD  omeprazole (PRILOSEC) 40 MG capsule Take 40 mg by mouth daily.    Yes Historical Provider, MD  potassium chloride SA (K-DUR,KLOR-CON) 20 MEQ tablet Take 20 mEq by mouth daily.   Yes Historical Provider, MD  SPIRIVA HANDIHALER 18 MCG inhalation capsule Place 18 mcg into inhaler and inhale daily.  09/28/15  Yes Historical Provider, MD  cyclobenzaprine (FLEXERIL) 5 MG tablet Take 1 tablet (5 mg total) by mouth 3 (three) times daily as needed for muscle spasms. 10/11/16   Donnetta Hutching, MD  predniSONE (STERAPRED UNI-PAK 21 TAB) 10 MG (21) TBPK tablet Take 6 tabs by mouth daily  for 2 days, then 5 tabs for 2 days, then 4 tabs for 2 days, then 3 tabs for 2 days, 2 tabs for 2 days, then 1 tab by mouth daily for 2 days Patient not taking: Reported on 10/11/2016 09/28/16   Penny Pia, MD    Family History Family History  Problem Relation Age of Onset  . Ulcers Father     bleeding/gastric  . Cancer Father   . Colon cancer Neg Hx   . Esophageal cancer Neg Hx   . Stomach cancer Neg Hx   . Colon polyps Neg Hx   . Rectal cancer Neg Hx     Social History Social History  Substance Use Topics  . Smoking status: Former Smoker    Packs/day: 2.00    Years: 54.00    Types: Cigarettes    Quit date: 02/25/1999  . Smokeless tobacco: Never Used  . Alcohol use Yes     Comment: only on anniversary      Allergies   Cortisone; Penicillins; and Levofloxacin   Review of  Systems Review of Systems  All other systems reviewed and are negative.    Physical Exam Updated Vital Signs BP 153/86   Pulse 105   Temp 97.7 F (36.5 C) (Oral)   Resp 19   Ht 5' 4.5" (1.638 m)   Wt 194 lb (88 kg)   SpO2 96%   BMI 32.79 kg/m   Physical Exam  Constitutional: She is oriented to person, place, and time. She appears  well-developed and well-nourished.  HENT:  Head: Normocephalic and atraumatic.  Eyes: Conjunctivae are normal.  Neck: Neck supple.  Cardiovascular: Normal rate and regular rhythm.   Pulmonary/Chest: Effort normal and breath sounds normal.  Abdominal: Soft. Bowel sounds are normal.  Musculoskeletal: Normal range of motion.  Neurological: She is alert and oriented to person, place, and time.  Skin: Skin is warm and dry.  Psychiatric: She has a normal mood and affect. Her behavior is normal.  Nursing note and vitals reviewed.    ED Treatments / Results  Labs (all labs ordered are listed, but only abnormal results are displayed) Labs Reviewed  BASIC METABOLIC PANEL - Abnormal; Notable for the following:       Result Value   CO2 21 (*)    Glucose, Bld 115 (*)    Creatinine, Ser 1.31 (*)    GFR calc non Af Amer 38 (*)    GFR calc Af Amer 44 (*)    All other components within normal limits  CBC - Abnormal; Notable for the following:    WBC 18.1 (*)    RDW 15.8 (*)    All other components within normal limits  HEPATIC FUNCTION PANEL  LIPASE, BLOOD  URINALYSIS, ROUTINE W REFLEX MICROSCOPIC  I-STAT TROPOININ, ED    EKG  EKG Interpretation  Date/Time:  Saturday October 11 2016 15:53:50 EST Ventricular Rate:  92 PR Interval:  150 QRS Duration: 76 QT Interval:  326 QTC Calculation: 403 R Axis:   3 Text Interpretation:  Normal sinus rhythm Cannot rule out Anterior infarct , age undetermined Abnormal ECG Confirmed by Mashonda Broski  MD, Grace Haggart ([id number]) on 10/11/2016 10:00:38 PM       Radiology Dg Chest 2 View  Result Date: 10/11/2016 CLINICAL  DATA:  Complaints of generalized muscle cramping, tremors, chest pain, and lightheadedness that started after taking potassium. Pt reports that she had some tremors and muscle cramping before and was diagnosed with hypokalemia. Pt repor.*comment was truncated* EXAM: CHEST  2 VIEW COMPARISON:  None. FINDINGS: Normal mediastinum and cardiac silhouette. Normal pulmonary vasculature. No evidence of effusion, infiltrate, or pneumothorax. No acute bony abnormality. IMPRESSION: No acute cardiopulmonary process. Electronically Signed   By: Genevive BiStewart  Edmunds M.D.   On: 10/11/2016 20:50    Procedures Procedures (including critical care time)  Medications Ordered in ED Medications  cyclobenzaprine (FLEXERIL) tablet 5 mg (5 mg Oral Given 10/11/16 2259)     Initial Impression / Assessment and Plan / ED Course  I have reviewed the triage vital signs and the nursing notes.  Pertinent labs & imaging results that were available during my care of the patient were reviewed by me and considered in my medical decision making (see chart for details).  Clinical Course     Patient is in no acute distress. Potassium is normal. Will Rx Flexeril 5 mg prn for muscle cramping. Will discontinue hydrochlorothiazide and monitor her blood pressure. She has primary care follow-up.  Final Clinical Impressions(s) / ED Diagnoses   Final diagnoses:  Muscle cramping    New Prescriptions New Prescriptions   CYCLOBENZAPRINE (FLEXERIL) 5 MG TABLET    Take 1 tablet (5 mg total) by mouth 3 (three) times daily as needed for muscle spasms.     Donnetta HutchingBrian Zarai Orsborn, MD 10/12/16 [phone]

## 2016-10-11 NOTE — ED Notes (Signed)
Nurse will get labs 

## 2016-10-11 NOTE — ED Notes (Addendum)
Pt complains of congestion and cramping to her hands. Pt states she was placed on potassium Tuesday by her PCP.

## 2016-10-11 NOTE — Discharge Instructions (Signed)
Recommend discontinuing hydrochlorothiazide or HCTZ. Monitor your blood pressure to see how is doing. Prescription for muscle relaxer. Your potassium is within normal range today. Drop back to 1 potassium tablet a day.  Your blood work was quite good today. Follow-up your primary care doctor within a week.

## 2016-10-11 NOTE — ED Triage Notes (Signed)
Per Pt, Pt is coming from home with complaints of generalized muscle cramping, tremors, chest pain, and lightheadedness that started after taking potassium. Pt reports that she had some tremors and muscle cramping before and was diagnosed with hypokalemia. Pt reports now, she has been on the medication since Tuesday and reports worsening symptoms.

## 2016-10-11 NOTE — ED Notes (Signed)
ED Provider at bedside. 

## 2016-10-20 ENCOUNTER — Observation Stay (HOSPITAL_COMMUNITY)
Admission: EM | Admit: 2016-10-20 | Discharge: 2016-10-21 | Disposition: A | Discharge status: Home / Self Care | Payer: Medicare Other | Attending: Internal Medicine | Admitting: Internal Medicine

## 2016-10-20 ENCOUNTER — Encounter (HOSPITAL_COMMUNITY): Payer: Self-pay | Admitting: Emergency Medicine

## 2016-10-20 ENCOUNTER — Emergency Department (HOSPITAL_COMMUNITY): Payer: Medicare Other

## 2016-10-20 DIAGNOSIS — R05 Cough: Principal | ICD-10-CM | POA: Insufficient documentation

## 2016-10-20 DIAGNOSIS — J029 Acute pharyngitis, unspecified: Secondary | ICD-10-CM | POA: Diagnosis not present

## 2016-10-20 DIAGNOSIS — R69 Illness, unspecified: Secondary | ICD-10-CM

## 2016-10-20 DIAGNOSIS — I129 Hypertensive chronic kidney disease with stage 1 through stage 4 chronic kidney disease, or unspecified chronic kidney disease: Secondary | ICD-10-CM | POA: Diagnosis not present

## 2016-10-20 DIAGNOSIS — M7989 Other specified soft tissue disorders: Secondary | ICD-10-CM | POA: Diagnosis not present

## 2016-10-20 DIAGNOSIS — Z79899 Other long term (current) drug therapy: Secondary | ICD-10-CM | POA: Diagnosis not present

## 2016-10-20 DIAGNOSIS — M549 Dorsalgia, unspecified: Secondary | ICD-10-CM | POA: Diagnosis not present

## 2016-10-20 DIAGNOSIS — Z87891 Personal history of nicotine dependence: Secondary | ICD-10-CM | POA: Insufficient documentation

## 2016-10-20 DIAGNOSIS — R059 Cough, unspecified: Secondary | ICD-10-CM

## 2016-10-20 DIAGNOSIS — J441 Chronic obstructive pulmonary disease with (acute) exacerbation: Secondary | ICD-10-CM | POA: Diagnosis not present

## 2016-10-20 DIAGNOSIS — Z7982 Long term (current) use of aspirin: Secondary | ICD-10-CM | POA: Insufficient documentation

## 2016-10-20 DIAGNOSIS — N183 Chronic kidney disease, stage 3 unspecified: Secondary | ICD-10-CM | POA: Diagnosis present

## 2016-10-20 DIAGNOSIS — R509 Fever, unspecified: Secondary | ICD-10-CM | POA: Diagnosis not present

## 2016-10-20 DIAGNOSIS — J111 Influenza due to unidentified influenza virus with other respiratory manifestations: Secondary | ICD-10-CM

## 2016-10-20 DIAGNOSIS — J45909 Unspecified asthma, uncomplicated: Secondary | ICD-10-CM | POA: Insufficient documentation

## 2016-10-20 DIAGNOSIS — R0689 Other abnormalities of breathing: Secondary | ICD-10-CM | POA: Diagnosis not present

## 2016-10-20 DIAGNOSIS — R079 Chest pain, unspecified: Secondary | ICD-10-CM | POA: Diagnosis not present

## 2016-10-20 DIAGNOSIS — R51 Headache: Secondary | ICD-10-CM | POA: Insufficient documentation

## 2016-10-20 DIAGNOSIS — I10 Essential (primary) hypertension: Secondary | ICD-10-CM | POA: Diagnosis present

## 2016-10-20 DIAGNOSIS — J101 Influenza due to other identified influenza virus with other respiratory manifestations: Secondary | ICD-10-CM

## 2016-10-20 LAB — CBC WITH DIFFERENTIAL/PLATELET
Basophils Absolute: 0 10*3/uL (ref 0.0–0.1)
Basophils Relative: 1 %
Eosinophils Absolute: 0.1 10*3/uL (ref 0.0–0.7)
Eosinophils Relative: 1 %
HCT: 34.3 % — ABNORMAL LOW (ref 36.0–46.0)
Hemoglobin: 11.1 g/dL — ABNORMAL LOW (ref 12.0–15.0)
Lymphocytes Relative: 10 %
Lymphs Abs: 0.6 10*3/uL — ABNORMAL LOW (ref 0.7–4.0)
MCH: 29.1 pg (ref 26.0–34.0)
MCHC: 32.4 g/dL (ref 30.0–36.0)
MCV: 89.8 fL (ref 78.0–100.0)
Monocytes Absolute: 0.5 10*3/uL (ref 0.1–1.0)
Monocytes Relative: 7 %
Neutro Abs: 5.2 10*3/uL (ref 1.7–7.7)
Neutrophils Relative %: 81 %
Platelets: 238 10*3/uL (ref 150–400)
RBC: 3.82 MIL/uL — ABNORMAL LOW (ref 3.87–5.11)
RDW: 15.7 % — ABNORMAL HIGH (ref 11.5–15.5)
WBC: 6.4 10*3/uL (ref 4.0–10.5)

## 2016-10-20 LAB — BASIC METABOLIC PANEL
Anion gap: 8 (ref 5–15)
BUN: 11 mg/dL (ref 6–20)
CO2: 24 mmol/L (ref 22–32)
Calcium: 9.1 mg/dL (ref 8.9–10.3)
Chloride: 104 mmol/L (ref 101–111)
Creatinine, Ser: 1.42 mg/dL — ABNORMAL HIGH (ref 0.44–1.00)
GFR calc Af Amer: 40 mL/min — ABNORMAL LOW (ref >60)
GFR calc non Af Amer: 34 mL/min — ABNORMAL LOW (ref >60)
Glucose, Bld: 128 mg/dL — ABNORMAL HIGH (ref 65–99)
Potassium: 4 mmol/L (ref 3.5–5.1)
Sodium: 136 mmol/L (ref 135–145)

## 2016-10-20 LAB — D-DIMER, QUANTITATIVE: D-Dimer, Quant: 1.51 ug/mL-FEU — ABNORMAL HIGH (ref 0.00–0.50)

## 2016-10-20 MED ORDER — OSELTAMIVIR PHOSPHATE 75 MG PO CAPS
75.0000 mg | ORAL_CAPSULE | Freq: Two times a day (BID) | ORAL | 0 refills | Status: DC
Start: 2016-10-20 — End: 2016-10-21

## 2016-10-20 MED ORDER — BENZONATATE 100 MG PO CAPS
200.0000 mg | ORAL_CAPSULE | Freq: Once | ORAL | Status: AC
  Administered 2016-10-20: 200 mg via ORAL
  Filled 2016-10-20: qty 2

## 2016-10-20 MED ORDER — IOPAMIDOL (ISOVUE-370) INJECTION 76%
80.0000 mL | Freq: Once | INTRAVENOUS | Status: AC | PRN
  Administered 2016-10-20: 80 mL via INTRAVENOUS

## 2016-10-20 MED ORDER — BENZONATATE 100 MG PO CAPS: 100.0000 mg | ORAL_CAPSULE | Freq: Three times a day (TID) | ORAL | 0 refills | Status: DC

## 2016-10-20 MED ORDER — ACETAMINOPHEN 500 MG PO TABS
1000.0000 mg | ORAL_TABLET | Freq: Once | ORAL | Status: AC
  Administered 2016-10-20: 1000 mg via ORAL

## 2016-10-20 MED ORDER — ACETAMINOPHEN 500 MG PO TABS: 1000.0000 mg | ORAL_TABLET | Freq: Once | ORAL | Status: DC

## 2016-10-20 MED ORDER — OSELTAMIVIR PHOSPHATE 75 MG PO CAPS
75.0000 mg | ORAL_CAPSULE | Freq: Once | ORAL | Status: AC
  Administered 2016-10-20: 75 mg via ORAL
  Filled 2016-10-20: qty 1

## 2016-10-20 MED ORDER — ACETAMINOPHEN 500 MG PO TABS
ORAL_TABLET | ORAL | Status: AC
  Filled 2016-10-20: qty 2

## 2016-10-20 NOTE — ED Notes (Signed)
Patient requesting something for cough. Dr. Juleen ChinaKohut made aware. New verbal orders given.

## 2016-10-20 NOTE — ED Notes (Signed)
Ambulated pt and pts O2 dropped to %86 while walking. Pt SOB during and after ambulating.

## 2016-10-20 NOTE — ED Notes (Signed)
Dr. Juleen ChinaKohut updated on patients condition and presentation. Pt to be admitted, pt and family agree with treatment plan

## 2016-10-20 NOTE — ED Triage Notes (Signed)
Patient complaining of cough x 2 months.

## 2016-10-20 NOTE — ED Provider Notes (Addendum)
[facility] Provider Note   CSN: [id number] Arrival date & time: 10/20/16  1812     History   Chief Complaint Chief Complaint  Patient presents with  . Cough    HPI Deanna Acosta is a 81 y.o. female.  HPI  Has had a cough for  2 months. States his been worse last couple days. She's been seen in the ER and even admitted for a COPD exacerbation. Has been on and off steroids and antibiotics. Sst 1-2 days she's gotten worse. States she has minimal white production.  She states fevers up to 100 at home. States her  Temperature normally runs low. Slight dull chest pain. States she has a sore throat. Also has some swelling in her left leg. States it will swell up at times and other times not be swollen. She's had a blood clot in a leg years ago but does not number which leg it was. She has some dull  Chest pain. Past Medical History:  Diagnosis Date  . Allergy    seasonal  . Anxiety   . Arthritis   . Asthma    bronchitis  . Blood clot in vein    LEG    YEARS AGO  . Blood dyscrasia    ELEVATED WBC  HX  . Cataract    removed bilaterally   . Clotting disorder (HCC)    clot in leg   . COPD (chronic obstructive pulmonary disease) (HCC)   . Depression   . GERD (gastroesophageal reflux disease)   . Hyperlipidemia   . Hypertension   . Osteoporosis   . Pneumonia    2013  . RSD (reflex sympathetic dystrophy)   . Shortness of breath   . Ulcer (HCC)    H-pylorie treated    Patient Active Problem List   Diagnosis Date Noted  . COPD with acute exacerbation (HCC) 09/26/2016  . Headache(784.0) 09/24/2012  . COPD exacerbation (HCC) 09/23/2012  . Hypoxemia 09/23/2012  . Vomiting 09/23/2012  . Dehydration 09/23/2012  . HTN (hypertension) 09/23/2012  . Chronic kidney disease, stage III (moderate) 09/23/2012    Past Surgical History:  Procedure Laterality Date  . CATARACT EXTRACTION W/PHACO Left 11/29/2013   Procedure: CATARACT EXTRACTION PHACO AND INTRAOCULAR LENS  PLACEMENT (IOC);  Surgeon: Loraine Leriche T. Nile Riggs, MD;  Location: [facility];  Service: Ophthalmology;  Laterality: Left;  CDE 21.93  . CATARACT EXTRACTION W/PHACO Right 12/13/2013   Procedure: CATARACT EXTRACTION PHACO AND INTRAOCULAR LENS PLACEMENT (IOC);  Surgeon: Loraine Leriche T. Nile Riggs, MD;  Location: [facility];  Service: Ophthalmology;  Laterality: Right;  CDE:5.40  . CHOLECYSTECTOMY  2000  . COLONOSCOPY    . ELBOW ARTHROSCOPY     tendonitis/right  . GANGLION CYST EXCISION     left wrist  . JOINT REPLACEMENT Left    knee- left   . KNEE ARTHROSCOPY     right  . KNEE LIGAMENT RECONSTRUCTION     right/water ski accident  . ORIF DISTAL RADIUS FRACTURE  2002   plate  . POLYPECTOMY    . ROTATOR CUFF REPAIR  2009  . TONSILLECTOMY AND ADENOIDECTOMY    . TOTAL ABDOMINAL HYSTERECTOMY  1980   fibroids  . TOTAL KNEE ARTHROPLASTY Left 06/20/2013   Procedure: LEFT TOTAL KNEE ARTHROPLASTY;  Surgeon: Verlee Rossetti, MD;  Location: [facility] OR;  Service: Orthopedics;  Laterality: Left;  . UPPER GASTROINTESTINAL ENDOSCOPY    . YAG LASER APPLICATION Left 12/04/2015   Procedure: YAG LASER APPLICATION;  Surgeon: Jethro Bolus,  MD;  Location: [facility];  Service: Ophthalmology;  Laterality: Left;    OB History    No data available       Home Medications    Prior to Admission medications   Medication Sig Start Date End Date Taking? Authorizing Provider  albuterol (PROVENTIL HFA;VENTOLIN HFA) 108 (90 Base) MCG/ACT inhaler Inhale into the lungs every 6 (six) hours as needed for wheezing or shortness of breath.   Yes Historical Provider, MD  alendronate (FOSAMAX) 70 MG tablet Take 70 mg by mouth once a week.  09/10/16  Yes Historical Provider, MD  aspirin 325 MG tablet Take 325 mg by mouth every 6 (six) hours as needed for moderate pain or headache.   Yes Historical Provider, MD  Cholecalciferol 1000 units capsule Take 1,000 Units by mouth daily.   Yes Historical Provider, MD  cyclobenzaprine (FLEXERIL) 5 MG tablet Take 1  tablet (5 mg total) by mouth 3 (three) times daily as needed for muscle spasms. 10/11/16  Yes Donnetta HutchingBrian Cook, MD  cycloSPORINE (RESTASIS) 0.05 % ophthalmic emulsion Place 1 drop into both eyes 2 times daily as needed for dry eyes. Mostly takes at bedtime 11/27/15  Yes Historical Provider, MD  diltiazem (CARDIZEM CD) 120 MG 24 hr capsule Take 120 mg by mouth daily.  08/01/14  Yes Historical Provider, MD  fexofenadine (ALLEGRA) 180 MG tablet Take 180 mg by mouth daily.   Yes Historical Provider, MD  FLUoxetine (PROZAC) 40 MG capsule Take 40 mg by mouth daily before breakfast.    Yes Historical Provider, MD  fluticasone (FLONASE) 50 MCG/ACT nasal spray Place 1 spray into both nostrils daily as needed for allergies.  06/13/14  Yes Historical Provider, MD  Fluticasone-Salmeterol (ADVAIR) 250-50 MCG/DOSE AEPB Inhale 1 puff into the lungs every 12 (twelve) hours.   Yes Historical Provider, MD  hydrochlorothiazide (HYDRODIURIL) 25 MG tablet Take 1 tablet by mouth every 7 (seven) days.  09/06/15  Yes Historical Provider, MD  omeprazole (PRILOSEC) 40 MG capsule Take 40 mg by mouth daily.    Yes Historical Provider, MD  potassium chloride SA (K-DUR,KLOR-CON) 20 MEQ tablet Take 20 mEq by mouth daily.   Yes Historical Provider, MD  SPIRIVA HANDIHALER 18 MCG inhalation capsule Place 18 mcg into inhaler and inhale daily.  09/28/15  Yes Historical Provider, MD  predniSONE (STERAPRED UNI-PAK 21 TAB) 10 MG (21) TBPK tablet Take 6 tabs by mouth daily  for 2 days, then 5 tabs for 2 days, then 4 tabs for 2 days, then 3 tabs for 2 days, 2 tabs for 2 days, then 1 tab by mouth daily for 2 days Patient not taking: Reported on 10/11/2016 09/28/16   Penny Piarlando Vega, MD    Family History Family History  Problem Relation Age of Onset  . Ulcers Father     bleeding/gastric  . Cancer Father   . Colon cancer Neg Hx   . Esophageal cancer Neg Hx   . Stomach cancer Neg Hx   . Colon polyps Neg Hx   . Rectal cancer Neg Hx     Social  History Social History  Substance Use Topics  . Smoking status: Former Smoker    Packs/day: 2.00    Years: 54.00    Types: Cigarettes    Quit date: 02/25/1999  . Smokeless tobacco: Never Used  . Alcohol use Yes     Comment: only on anniversary      Allergies   Cortisone; Penicillins; and Levofloxacin   Review of Systems Review  of Systems  Constitutional: Positive for appetite change and fever.  HENT: Positive for sore throat.   Respiratory: Positive for cough.   Cardiovascular: Positive for chest pain and leg swelling.  Gastrointestinal: Negative for abdominal pain.  Endocrine: Negative for polyuria.  Genitourinary: Negative for dyspareunia and dysuria.  Musculoskeletal: Positive for back pain ( patient states the coughing is made her back hurt.).  Neurological: Positive for headaches. Negative for syncope, weakness and numbness.  Hematological: Negative for adenopathy.  Psychiatric/Behavioral: Negative for confusion.     Physical Exam Updated Vital Signs BP 155/67 (BP Location: Left Arm)   Pulse 103   Temp 99.6 F (37.6 C) (Oral)   Resp 22   Ht 5\' 4"  (1.626 m)   Wt 194 lb (88 kg)   SpO2 96%   BMI 33.30 kg/m   Physical Exam  Constitutional: She appears well-developed.  HENT:  Head: Atraumatic.  Mouth/Throat: No oropharyngeal exudate.  Very mild posterior fragile erythema without exudate.  Eyes: Pupils are equal, round, and reactive to light.  Cardiovascular: Normal rate.   Pulmonary/Chest:  Frequent coughing with mildly harsh breath sounds.  Abdominal: Soft. There is no tenderness.  Musculoskeletal: She exhibits edema.  Moderate edema of left lower extremity compared to right.  Lymphadenopathy:    She has no cervical adenopathy.  Skin: Skin is warm.     ED Treatments / Results  Labs (all labs ordered are listed, but only abnormal results are displayed) Labs Reviewed  D-DIMER, QUANTITATIVE (NOT AT [facility]) - Abnormal; Notable for the following:        Result Value   D-Dimer, Quant 1.51 (*)    All other components within normal limits  CBC WITH DIFFERENTIAL/PLATELET - Abnormal; Notable for the following:    RBC 3.82 (*)    Hemoglobin 11.1 (*)    HCT 34.3 (*)    RDW 15.7 (*)    Lymphs Abs 0.6 (*)    All other components within normal limits  BASIC METABOLIC PANEL - Abnormal; Notable for the following:    Glucose, Bld 128 (*)    Creatinine, Ser 1.42 (*)    GFR calc non Af Amer 34 (*)    GFR calc Af Amer 40 (*)    All other components within normal limits    EKG  EKG Interpretation None       Radiology Dg Chest 2 View  Result Date: 10/20/2016 CLINICAL DATA:  Productive cough with worsening shortness of breath, body aches and headaches for 2 days. EXAM: CHEST  2 VIEW COMPARISON:  10/11/2016 and 09/26/2016. FINDINGS: The heart size and mediastinal contours are stable. There is a probable hiatal hernia. There is improved aeration of the lung bases on the lateral view. There is chronic central airway thickening and interstitial prominence, but no edema, focal airspace disease or pleural effusion. The bones appear unchanged. IMPRESSION: Stable chronic interstitial prominence and central airway thickening. No acute cardiopulmonary process. Electronically Signed   By: Carey Bullocks M.D.   On: 10/20/2016 19:10    Procedures Procedures (including critical care time)  Medications Ordered in ED Medications - No data to display   Initial Impression / Assessment and Plan / ED Course  I have reviewed the triage vital signs and the nursing notes.  Pertinent labs & imaging results that were available during my care of the patient were reviewed by me and considered in my medical decision making (see chart for details).  Clinical Course     Patient presents with  cough and feeling bad. Mild production. Afebrile here. X-ray shows some chronic interstitial changes. Does have swelling of left leg. With the swelling will require Doppler as an  outpatient. With d-dimer being positive CT angiography of the chest will be done. It'll also help clarify the pulmonary situation. Care turned over to Dr. Juleen China.  Patient is now developed a fever of 102.5. Will add influenza testing.  Final Clinical Impressions(s) / ED Diagnoses   Final diagnoses:  Cough    New Prescriptions New Prescriptions   No medications on file     Benjiman Core, MD 10/20/16 2117    Benjiman Core, MD 10/20/16 2123

## 2016-10-21 ENCOUNTER — Encounter (HOSPITAL_COMMUNITY): Payer: Self-pay | Admitting: *Deleted

## 2016-10-21 ENCOUNTER — Observation Stay (HOSPITAL_COMMUNITY): Payer: Medicare Other

## 2016-10-21 DIAGNOSIS — J101 Influenza due to other identified influenza virus with other respiratory manifestations: Secondary | ICD-10-CM

## 2016-10-21 DIAGNOSIS — I10 Essential (primary) hypertension: Secondary | ICD-10-CM | POA: Diagnosis not present

## 2016-10-21 DIAGNOSIS — N183 Chronic kidney disease, stage 3 (moderate): Secondary | ICD-10-CM | POA: Diagnosis not present

## 2016-10-21 DIAGNOSIS — J441 Chronic obstructive pulmonary disease with (acute) exacerbation: Secondary | ICD-10-CM

## 2016-10-21 LAB — COMPREHENSIVE METABOLIC PANEL
ALT: 27 U/L (ref 14–54)
AST: 27 U/L (ref 15–41)
Albumin: 3.1 g/dL — ABNORMAL LOW (ref 3.5–5.0)
Alkaline Phosphatase: 81 U/L (ref 38–126)
Anion gap: 9 (ref 5–15)
BUN: 12 mg/dL (ref 6–20)
CO2: 22 mmol/L (ref 22–32)
Calcium: 8.9 mg/dL (ref 8.9–10.3)
Chloride: 103 mmol/L (ref 101–111)
Creatinine, Ser: 1.4 mg/dL — ABNORMAL HIGH (ref 0.44–1.00)
GFR calc Af Amer: 41 mL/min — ABNORMAL LOW (ref >60)
GFR calc non Af Amer: 35 mL/min — ABNORMAL LOW (ref >60)
Glucose, Bld: 132 mg/dL — ABNORMAL HIGH (ref 65–99)
Potassium: 4 mmol/L (ref 3.5–5.1)
Sodium: 134 mmol/L — ABNORMAL LOW (ref 135–145)
Total Bilirubin: 0.6 mg/dL (ref 0.3–1.2)
Total Protein: 6.5 g/dL (ref 6.5–8.1)

## 2016-10-21 LAB — INFLUENZA PANEL BY PCR (TYPE A & B)
Influenza A By PCR: POSITIVE — AB
Influenza B By PCR: NEGATIVE

## 2016-10-21 LAB — CBC
HCT: 35.4 % — ABNORMAL LOW (ref 36.0–46.0)
Hemoglobin: 11.4 g/dL — ABNORMAL LOW (ref 12.0–15.0)
MCH: 28.6 pg (ref 26.0–34.0)
MCHC: 32.2 g/dL (ref 30.0–36.0)
MCV: 88.7 fL (ref 78.0–100.0)
Platelets: 253 10*3/uL (ref 150–400)
RBC: 3.99 MIL/uL (ref 3.87–5.11)
RDW: 15.9 % — ABNORMAL HIGH (ref 11.5–15.5)
WBC: 6.6 10*3/uL (ref 4.0–10.5)

## 2016-10-21 MED ORDER — DOXYCYCLINE HYCLATE 100 MG PO TABS
100.0000 mg | ORAL_TABLET | Freq: Two times a day (BID) | ORAL | Status: DC
  Administered 2016-10-21: 100 mg via ORAL
  Filled 2016-10-21 (×2): qty 1

## 2016-10-21 MED ORDER — ONDANSETRON HCL 4 MG PO TABS: 4.0000 mg | ORAL_TABLET | Freq: Four times a day (QID) | ORAL | Status: DC | PRN

## 2016-10-21 MED ORDER — DEXTROMETHORPHAN POLISTIREX ER 30 MG/5ML PO SUER
15.0000 mg | Freq: Two times a day (BID) | ORAL | Status: DC | PRN
  Filled 2016-10-21: qty 5

## 2016-10-21 MED ORDER — IPRATROPIUM BROMIDE 0.02 % IN SOLN: 0.5000 mg | Freq: Four times a day (QID) | RESPIRATORY_TRACT | Status: DC

## 2016-10-21 MED ORDER — HYDROCOD POLST-CPM POLST ER 10-8 MG/5ML PO SUER
ORAL | Status: AC
  Administered 2016-10-21: 5 mL via ORAL
  Filled 2016-10-21: qty 5

## 2016-10-21 MED ORDER — METHYLPREDNISOLONE SODIUM SUCC 125 MG IJ SOLR
60.0000 mg | Freq: Four times a day (QID) | INTRAMUSCULAR | Status: DC
  Administered 2016-10-21: 60 mg via INTRAVENOUS
  Filled 2016-10-21 (×2): qty 2

## 2016-10-21 MED ORDER — HYDROCOD POLST-CPM POLST ER 10-8 MG/5ML PO SUER
5.0000 mL | Freq: Once | ORAL | Status: AC
Start: 2016-10-21 — End: 2016-10-21
  Administered 2016-10-21 (×2): 5 mL via ORAL

## 2016-10-21 MED ORDER — OSELTAMIVIR PHOSPHATE 30 MG PO CAPS: 30.0000 mg | ORAL_CAPSULE | Freq: Two times a day (BID) | ORAL | 0 refills | Status: DC

## 2016-10-21 MED ORDER — PANTOPRAZOLE SODIUM 40 MG PO TBEC
40.0000 mg | DELAYED_RELEASE_TABLET | Freq: Every day | ORAL | Status: DC
  Administered 2016-10-21: 40 mg via ORAL
  Filled 2016-10-21: qty 1

## 2016-10-21 MED ORDER — FLUOXETINE HCL 20 MG PO CAPS
40.0000 mg | ORAL_CAPSULE | Freq: Every day | ORAL | Status: DC
  Administered 2016-10-21: 40 mg via ORAL
  Filled 2016-10-21: qty 2

## 2016-10-21 MED ORDER — OSELTAMIVIR PHOSPHATE 30 MG PO CAPS
30.0000 mg | ORAL_CAPSULE | Freq: Two times a day (BID) | ORAL | Status: DC
  Administered 2016-10-21: 30 mg via ORAL
  Filled 2016-10-21 (×3): qty 1

## 2016-10-21 MED ORDER — OSELTAMIVIR PHOSPHATE 75 MG PO CAPS: 75.0000 mg | ORAL_CAPSULE | Freq: Two times a day (BID) | ORAL | Status: DC

## 2016-10-21 MED ORDER — DILTIAZEM HCL ER COATED BEADS 120 MG PO CP24
120.0000 mg | ORAL_CAPSULE | Freq: Every day | ORAL | Status: DC
  Administered 2016-10-21: 120 mg via ORAL
  Filled 2016-10-21: qty 1

## 2016-10-21 MED ORDER — VITAMIN D 1000 UNITS PO TABS
1000.0000 [IU] | ORAL_TABLET | Freq: Every day | ORAL | Status: DC
  Administered 2016-10-21: 1000 [IU] via ORAL
  Filled 2016-10-21: qty 1

## 2016-10-21 MED ORDER — IPRATROPIUM-ALBUTEROL 0.5-2.5 (3) MG/3ML IN SOLN
3.0000 mL | Freq: Four times a day (QID) | RESPIRATORY_TRACT | Status: DC
  Administered 2016-10-21 (×2): 3 mL via RESPIRATORY_TRACT
  Filled 2016-10-21 (×2): qty 3

## 2016-10-21 MED ORDER — ALBUTEROL SULFATE (2.5 MG/3ML) 0.083% IN NEBU: 2.5000 mg | INHALATION_SOLUTION | Freq: Four times a day (QID) | RESPIRATORY_TRACT | Status: DC

## 2016-10-21 MED ORDER — ENOXAPARIN SODIUM 40 MG/0.4ML ~~LOC~~ SOLN
40.0000 mg | SUBCUTANEOUS | Status: DC
  Administered 2016-10-21: 40 mg via SUBCUTANEOUS
  Filled 2016-10-21: qty 0.4

## 2016-10-21 MED ORDER — GUAIFENESIN ER 600 MG PO TB12
600.0000 mg | ORAL_TABLET | Freq: Two times a day (BID) | ORAL | Status: DC
  Administered 2016-10-21 (×2): 600 mg via ORAL
  Filled 2016-10-21 (×2): qty 1

## 2016-10-21 MED ORDER — ONDANSETRON HCL 4 MG/2ML IJ SOLN: 4.0000 mg | Freq: Four times a day (QID) | INTRAMUSCULAR | Status: DC | PRN

## 2016-10-21 MED ORDER — ASPIRIN 325 MG PO TABS: 325.0000 mg | ORAL_TABLET | Freq: Four times a day (QID) | ORAL | Status: DC | PRN

## 2016-10-21 MED ORDER — DEXTROMETHORPHAN POLISTIREX ER 30 MG/5ML PO SUER: 15.0000 mg | Freq: Two times a day (BID) | ORAL | 0 refills | Status: DC | PRN

## 2016-10-21 MED ORDER — LORATADINE 10 MG PO TABS
10.0000 mg | ORAL_TABLET | Freq: Every day | ORAL | Status: DC
  Administered 2016-10-21: 10 mg via ORAL
  Filled 2016-10-21: qty 1

## 2016-10-21 MED ORDER — SODIUM CHLORIDE 0.9 % IV SOLN
INTRAVENOUS | Status: DC
  Administered 2016-10-21: 02:00:00 via INTRAVENOUS

## 2016-10-21 MED ORDER — ORAL CARE MOUTH RINSE
15.0000 mL | Freq: Two times a day (BID) | OROMUCOSAL | Status: DC
  Administered 2016-10-21 (×2): 15 mL via OROMUCOSAL

## 2016-10-21 NOTE — Progress Notes (Signed)
PHARMACY NOTE:  ANTIMICROBIAL RENAL DOSAGE ADJUSTMENT  Current antimicrobial regimen includes a mismatch between antimicrobial dosage and estimated renal function.  As per policy approved by the Pharmacy & Therapeutics and Medical Executive Committees, the antimicrobial dosage will be adjusted accordingly.  Current antimicrobial dosage:  Tamiflu 75mg  po BID  Indication: flu  Renal Function: Estimated Creatinine Clearance: 36.1 mL/min (by C-G formula based on SCr of 1.4 mg/dL (H)). []      On intermittent HD, scheduled: []      On CRRT    Antimicrobial dosage has been changed to:  30mg  PO bid  Additional comments:  Thank you for allowing pharmacy to be a part of this patient's care.  Wayland DenisHall, Holliday Sheaffer A, [facility] Advanced Surgery InstituteRPH 10/21/2016 8:03 AM

## 2016-10-21 NOTE — H&P (Signed)
TRH H&P    Patient Demographics:    Deanna Acosta, is a 81 y.o. female  MRN: [id number]  DOB - Jul 31, 1936  Admit Date - 10/20/2016  Referring MD/NP/PA: Dr. Juleen China  Outpatient Primary MD for the patient is [name], MD  Patient coming from: Home  Chief Complaint  Patient presents with  . Cough      HPI:    Deanna Acosta  is a 81 y.o. female, with history of COPD hypertension, hyperlipidemia, GERD, hiatal hernia who came to the hospital with worsening shortness of breath, cough with yellow colored phlegm, fever off 2 days duration. Patient has history of COPD and was recently treated with tapering dose of prednisone by PCP which patient completed a few days ago. Patient says that since stopping the prednisone she noticed worsening shortness of breath, cough. She denies chest pain. Does not use home oxygen. She also hasof left lower extremity swelling which has been chronic.  In the ED CT chest showed no evidence of pulmonary embolism, moderate size hiatal hernia with moderately distended esophagus above the level of heart hernia.  She denies nausea vomiting or diarrhea. No dysuria no abdominal pain.    Review of systems:    In addition to the HPI above,  + Fever-chills, No Headache, No changes with Vision or hearing, No Abdominal pain, No Nausea or Vomiting, bowel movements are regular, No Blood in stool or Urine, No dysuria, No new skin rashes or bruises, No new joints pains-aches,  No new weakness, tingling, numbness in any extremity, No recent weight gain or loss, No polyuria, polydypsia or polyphagia, No significant Mental Stressors.  A full 10 point Review of Systems was done, except as stated above, all other Review of Systems were negative.   With Past History of the following :    Past Medical History:  Diagnosis Date  . Allergy    seasonal  . Anxiety   . Arthritis     . Asthma    bronchitis  . Blood clot in vein    LEG    YEARS AGO  . Blood dyscrasia    ELEVATED WBC  HX  . Cataract    removed bilaterally   . Clotting disorder (HCC)    clot in leg   . COPD (chronic obstructive pulmonary disease) (HCC)   . Depression   . GERD (gastroesophageal reflux disease)   . Hyperlipidemia   . Hypertension   . Osteoporosis   . Pneumonia    2013  . RSD (reflex sympathetic dystrophy)   . Shortness of breath   . Ulcer (HCC)    H-pylorie treated      Past Surgical History:  Procedure Laterality Date  . CATARACT EXTRACTION W/PHACO Left 11/29/2013   Procedure: CATARACT EXTRACTION PHACO AND INTRAOCULAR LENS PLACEMENT (IOC);  Surgeon: Loraine Leriche T. Nile Riggs, MD;  Location: [facility];  Service: Ophthalmology;  Laterality: Left;  CDE 21.93  . CATARACT EXTRACTION W/PHACO Right 12/13/2013   Procedure: CATARACT EXTRACTION PHACO AND INTRAOCULAR LENS PLACEMENT (IOC);  Surgeon: Loraine Leriche T. Nile Riggs, MD;  Location: [facility];  Service: Ophthalmology;  Laterality: Right;  CDE:5.40  . CHOLECYSTECTOMY  2000  . COLONOSCOPY    . ELBOW ARTHROSCOPY     tendonitis/right  . GANGLION CYST EXCISION     left wrist  . JOINT REPLACEMENT Left    knee- left   . KNEE ARTHROSCOPY     right  . KNEE LIGAMENT RECONSTRUCTION     right/water ski accident  . ORIF DISTAL RADIUS FRACTURE  2002   plate  . POLYPECTOMY    . ROTATOR CUFF REPAIR  2009  . TONSILLECTOMY AND ADENOIDECTOMY    . TOTAL ABDOMINAL HYSTERECTOMY  1980   fibroids  . TOTAL KNEE ARTHROPLASTY Left 06/20/2013   Procedure: LEFT TOTAL KNEE ARTHROPLASTY;  Surgeon: Verlee Rossetti, MD;  Location: [facility] OR;  Service: Orthopedics;  Laterality: Left;  . UPPER GASTROINTESTINAL ENDOSCOPY    . YAG LASER APPLICATION Left 12/04/2015   Procedure: YAG LASER APPLICATION;  Surgeon: Jethro Bolus, MD;  Location: [facility];  Service: Ophthalmology;  Laterality: Left;      Social History:      Social History  Substance Use Topics  . Smoking status: Former  Smoker    Packs/day: 2.00    Years: 54.00    Types: Cigarettes    Quit date: 02/25/1999  . Smokeless tobacco: Never Used  . Alcohol use Yes     Comment: only on anniversary        Family History :     Family History  Problem Relation Age of Onset  . Ulcers Father     bleeding/gastric  . Cancer Father   . Colon cancer Neg Hx   . Esophageal cancer Neg Hx   . Stomach cancer Neg Hx   . Colon polyps Neg Hx   . Rectal cancer Neg Hx       Home Medications:   Prior to Admission medications   Medication Sig Start Date End Date Taking? Authorizing Provider  albuterol (PROVENTIL HFA;VENTOLIN HFA) 108 (90 Base) MCG/ACT inhaler Inhale into the lungs every 6 (six) hours as needed for wheezing or shortness of breath.   Yes Historical Provider, MD  alendronate (FOSAMAX) 70 MG tablet Take 70 mg by mouth once a week.  09/10/16  Yes Historical Provider, MD  aspirin 325 MG tablet Take 325 mg by mouth every 6 (six) hours as needed for moderate pain or headache.   Yes Historical Provider, MD  Cholecalciferol 1000 units capsule Take 1,000 Units by mouth daily.   Yes Historical Provider, MD  cyclobenzaprine (FLEXERIL) 5 MG tablet Take 1 tablet (5 mg total) by mouth 3 (three) times daily as needed for muscle spasms. 10/11/16  Yes Donnetta Hutching, MD  cycloSPORINE (RESTASIS) 0.05 % ophthalmic emulsion Place 1 drop into both eyes 2 times daily as needed for dry eyes. Mostly takes at bedtime 11/27/15  Yes Historical Provider, MD  diltiazem (CARDIZEM CD) 120 MG 24 hr capsule Take 120 mg by mouth daily.  08/01/14  Yes Historical Provider, MD  fexofenadine (ALLEGRA) 180 MG tablet Take 180 mg by mouth daily.   Yes Historical Provider, MD  FLUoxetine (PROZAC) 40 MG capsule Take 40 mg by mouth daily before breakfast.    Yes Historical Provider, MD  fluticasone (FLONASE) 50 MCG/ACT nasal spray Place 1 spray into both nostrils daily as needed for allergies.  06/13/14  Yes Historical Provider, MD  Fluticasone-Salmeterol  (ADVAIR) 250-50 MCG/DOSE AEPB Inhale 1 puff into the lungs every 12 (twelve) hours.  Yes Historical Provider, MD  hydrochlorothiazide (HYDRODIURIL) 25 MG tablet Take 1 tablet by mouth every 7 (seven) days.  09/06/15  Yes Historical Provider, MD  omeprazole (PRILOSEC) 40 MG capsule Take 40 mg by mouth daily.    Yes Historical Provider, MD  potassium chloride SA (K-DUR,KLOR-CON) 20 MEQ tablet Take 20 mEq by mouth daily.   Yes Historical Provider, MD  SPIRIVA HANDIHALER 18 MCG inhalation capsule Place 18 mcg into inhaler and inhale daily.  09/28/15  Yes Historical Provider, MD  benzonatate (TESSALON) 100 MG capsule Take 1 capsule (100 mg total) by mouth every 8 (eight) hours. 10/20/16   Raeford Razor, MD  oseltamivir (TAMIFLU) 75 MG capsule Take 1 capsule (75 mg total) by mouth every 12 (twelve) hours. 10/20/16   Raeford Razor, MD  predniSONE (STERAPRED UNI-PAK 21 TAB) 10 MG (21) TBPK tablet Take 6 tabs by mouth daily  for 2 days, then 5 tabs for 2 days, then 4 tabs for 2 days, then 3 tabs for 2 days, 2 tabs for 2 days, then 1 tab by mouth daily for 2 days Patient not taking: Reported on 10/11/2016 09/28/16   Penny Pia, MD     Allergies:     Allergies  Allergen Reactions  . Cortisone Other (See Comments)    headaches  . Penicillins Hives    Has patient had a PCN reaction causing immediate rash, facial/tongue/throat swelling, SOB or lightheadedness with hypotension: Yes Has patient had a PCN reaction causing severe rash involving mucus membranes or skin necrosis: No Has patient had a PCN reaction that required hospitalization No Has patient had a PCN reaction occurring within the last 10 years: No If all of the above answers are "NO", then may proceed with Cephalosporin use. 2  . Levofloxacin Rash     Physical Exam:   Vitals  Blood pressure 157/68, pulse 100, temperature 99 F (37.2 C), temperature source Oral, resp. rate (!) 27, height 5\' 4"  (1.626 m), weight 88 kg (194 lb), SpO2 91  %.  1.  General: Appears in mild respiratory distress   2. Psychiatric:  Intact judgement and  insight, awake alert, oriented x 3.  3. Neurologic: No focal neurological deficits, all cranial nerves intact.Strength 5/5 all 4 extremities, sensation intact all 4 extremities, plantars down going.  4. Eyes :  anicteric sclerae, moist conjunctivae with no lid lag. PERRLA.  5. ENMT:  Oropharynx clear with moist mucous membranes and good dentition  6. Neck:  supple, no cervical lymphadenopathy appriciated, No thyromegaly  7. Respiratory : Normal respiratory effort, bilateral wheezing on auscultation  8. Cardiovascular : RRR, no gallops, rubs or murmurs,1+ pitting edema of left lower extremity  9. Gastrointestinal:  Positive bowel sounds, abdomen soft, non-tender to palpation,no hepatosplenomegaly, no rigidity or guarding       10. Skin:  Left lower extremity ED with this, mild dusky erythema, mild tenderness to palpation  11.Musculoskeletal:  Good muscle tone,  joints appear normal , no effusions,  normal range of motion    Data Review:    CBC  Recent Labs Lab 10/20/16 1951  WBC 6.4  HGB 11.1*  HCT 34.3*  PLT 238  MCV 89.8  MCH 29.1  MCHC 32.4  RDW 15.7*  LYMPHSABS 0.6*  MONOABS 0.5  EOSABS 0.1  BASOSABS 0.0   ------------------------------------------------------------------------------------------------------------------  Chemistries   Recent Labs Lab 10/20/16 1951  NA 136  K 4.0  CL 104  CO2 24  GLUCOSE 128*  BUN 11  CREATININE 1.42*  CALCIUM 9.1   ------------------------------------------------------------------------------------------------------------------   --------------------------------------------------------------------------------------------------------------- Urine analysis:    Component Value Date/Time   COLORURINE YELLOW 10/11/2016 2104   APPEARANCEUR CLEAR 10/11/2016 2104   LABSPEC 1.020 10/11/2016 2104   PHURINE 5.0  10/11/2016 2104   GLUCOSEU NEGATIVE 10/11/2016 2104   HGBUR NEGATIVE 10/11/2016 2104   BILIRUBINUR NEGATIVE 10/11/2016 2104   KETONESUR NEGATIVE 10/11/2016 2104   PROTEINUR NEGATIVE 10/11/2016 2104   UROBILINOGEN 1.0 06/21/2013 1816   NITRITE NEGATIVE 10/11/2016 2104   LEUKOCYTESUR NEGATIVE 10/11/2016 2104      Imaging Results:    Dg Chest 2 View  Result Date: 10/20/2016 CLINICAL DATA:  Productive cough with worsening shortness of breath, body aches and headaches for 2 days. EXAM: CHEST  2 VIEW COMPARISON:  10/11/2016 and 09/26/2016. FINDINGS: The heart size and mediastinal contours are stable. There is a probable hiatal hernia. There is improved aeration of the lung bases on the lateral view. There is chronic central airway thickening and interstitial prominence, but no edema, focal airspace disease or pleural effusion. The bones appear unchanged. IMPRESSION: Stable chronic interstitial prominence and central airway thickening. No acute cardiopulmonary process. Electronically Signed   By: Carey BullocksWilliam  Veazey M.D.   On: 10/20/2016 19:10   Ct Angio Chest Pe W And/or Wo Contrast  Result Date: 10/20/2016 CLINICAL DATA:  Pt. Was recently admitted for a COPD exacerbation. Has been on and off steroids and antibiotics. Pt. states fevers up to 100 at home. States her Temperature normally runs low. Slight dull chest pain. Some swelling and left leg EXAM: CT ANGIOGRAPHY CHEST WITH CONTRAST TECHNIQUE: Multidetector CT imaging of the chest was performed using the standard protocol during bolus administration of intravenous contrast. Multiplanar CT image reconstructions and MIPs were obtained to evaluate the vascular anatomy. CONTRAST:  80 cc Isovue 370 COMPARISON:  Chest x-ray from earlier same day FINDINGS: Cardiovascular: Some of the most peripheral segmental and subsegmental pulmonary artery branches are difficult to definitively characterize due to patient breathing motion artifact but there is no convincing  pulmonary embolism identified within the main, lobar or central segmental pulmonary arteries bilaterally. Scattered atherosclerotic changes of the normal caliber thoracic aorta. No aortic aneurysm or dissection. Heart size is normal. No pericardial effusion. Mediastinum/Nodes: There is a moderate-sized hiatal hernia. There is at least moderate distention of the thoracic esophagus which contains fluid to the level of the upper mediastinum. No mass or enlarged lymph nodes seen within the mediastinum or perihilar regions. Trachea and central bronchi are unremarkable. Lungs/Pleura: Emphysematous changes bilaterally, at least moderate in degree, upper lobe predominant. 7 mm pulmonary nodule identified within the superior segment of the left lower lobe (series 6, image 42). No evidence of pneumonia. No pleural effusion or pneumothorax seen. Upper Abdomen: No acute abnormality identified within the upper abdomen Musculoskeletal: Mild degenerative change within the thoracic spine. No acute or suspicious osseous finding. Superficial soft tissues are unremarkable. Review of the MIP images confirms the above findings. IMPRESSION: 1. No evidence of pulmonary embolism, with mild study limitations detailed above. 2. Moderate-sized hiatal hernia. Esophagus is moderately distended above the level of the hiatal hernia, partially obstructed versus chronically patulous, with the esophagus containing fluid-like material to the level of the upper/mid mediastinum which raises the possibility of aspiration event. 3. No aortic aneurysm or dissection.  Heart size is normal. 4. Emphysema, at least moderate in degree, upper lobe predominant. 5. 7 mm pulmonary nodule within the superior segment of the left lower lobe. Non-contrast chest CT at 6-12 months  is recommended. If the nodule is stable at time of repeat CT, then future CT at 18-24 months (from today's scan) is considered optional for low-risk patients, but is recommended for high-risk  patients. Patient is presumably high risk given the emphysema. This recommendation follows the consensus statement: Guidelines for Management of Incidental Pulmonary Nodules Detected on CT Images: From the Fleischner Society 2017; Radiology 2017; 284:228-243. 6. No evidence of pneumonia.  No pleural effusion. Electronically Signed   By: Bary Richard M.D.   On: 10/20/2016 21:54       Assessment & Plan:    Active Problems:   COPD exacerbation (HCC)   Vomiting   HTN (hypertension)   Chronic kidney disease, stage III (moderate)   Left lower extremities swelling  1. COPD exacerbation- place under observation, start Solu-Medrol 60 mg IV every 6 hours, DuoNeb nebulizers every 6 hours, Mucinex 1 tablet by mouth twice a day. Start doxycycline 100 mg by mouth twice a day. Influenza PCR has been obtained, results are pending at this time. Will follow the results. She has received empiric Tamiflu 75 mg by mouth 1 in the ED. 2. Left lower extremity swelling- patient has swelling of left lower extremity, will obtain lower extremity venous Doppler in a.m. to rule out DVT. 3. Chronic kidney stage III- creatinine is 1.42, around her baseline. Start on gentle IV hydration with normal saline at 75 mL per hour for 12 hours.Follow BMP in a.m 4. Hypertension- blood pressure stable, continue Cardizem CD 120 mg by mouth daily   DVT Prophylaxis-   Lovenox   AM Labs Ordered, also please review Full Orders  Family Communication: Admission, patients condition and plan of care including tests being ordered have been discussed with the patient and her husband and daughter at bedside  who indicate understanding and agree with the plan and Code Status.  Code Status:  Full code  Admission status: Observation  Time spent in minutes : 60 minutes   Deanna Acosta S M.D on 10/21/2016 at 12:36 AM  Between 7am to 7pm - Pager - [phone]. After 7pm go to [email or web address] - password [facility]  [facility] - Office   [phone]

## 2016-10-21 NOTE — ED Notes (Signed)
[facility] I made her aware that patients influenza is still pending, that we have given on a dose of Tamiflu and have been taking precautions as if she Influenza positive. Patient will be transferred with mask, I place mask on patient myself and made Cletis AthensJon, ED Tech who will be transferring patient aware of precautions

## 2016-10-21 NOTE — Discharge Summary (Signed)
Physician Discharge Summary  Deanna Acosta AVW:[id number] DOB: 02-01-1936 DOA: 10/20/2016  PCP: [name], MD  Admit date: 10/20/2016 Discharge date: 10/21/2016  Time spent: 45 minutes  Recommendations for Outpatient Follow-up:  -To be discharged home today -Advised to continue Tamiflu for 5 days. -Advised to follow-up with PCP in 1-2 weeks   Discharge Diagnoses:  Principal Problem:   Influenza A Active Problems:   COPD exacerbation (HCC)   Vomiting   HTN (hypertension)   Chronic kidney disease, stage III (moderate)   Discharge Condition: Stable and improved  Filed Weights   10/20/16 1830 10/21/16 0100  Weight: 88 kg (194 lb) 90.4 kg (199 lb 3.2 oz)    History of present illness:  As per Dr. Sharl Ma on 1/16: Deanna Acosta  is a 81 y.o. female, with history of COPD hypertension, hyperlipidemia, GERD, hiatal hernia who came to the hospital with worsening shortness of breath, cough with yellow colored phlegm, fever off 2 days duration. Patient has history of COPD and was recently treated with tapering dose of prednisone by PCP which patient completed a few days ago. Patient says that since stopping the prednisone she noticed worsening shortness of breath, cough. She denies chest pain. Does not use home oxygen. She also hasof left lower extremity swelling which has been chronic.  In the ED CT chest showed no evidence of pulmonary embolism, moderate size hiatal hernia with moderately distended esophagus above the level of heart hernia.  She denies nausea vomiting or diarrhea. No dysuria no abdominal pain.  Hospital Course:   Shortness of breath/cough -Chest x-ray negative, flu PCR has resulted positive for influenza a, treatment with Tamiflu started and will be continued for 5 days total. -Patient advised on DC home today, rest and copious amounts of by mouth fluids as well as symptomatic management with over-the-counter cough suppressants and pain  relievers.  Procedures:  None   Consultations:  None  Discharge Instructions  Discharge Instructions    VAS [place] LOWER EXTREMITY VENOUS (DVT)    Complete by:   Oct 20, 2016 (Approximate)    Laterality:  Bilateral   Where should this test be performed:  [facility]   Reason for Exam:  Le pain   Expected Date:  ASAP   Diet - low sodium heart healthy    Complete by:  As directed    Increase activity slowly    Complete by:  As directed      Allergies as of 10/21/2016      Reactions   Cortisone Other (See Comments)   headaches   Penicillins Hives   Has patient had a PCN reaction causing immediate rash, facial/tongue/throat swelling, SOB or lightheadedness with hypotension: Yes Has patient had a PCN reaction causing severe rash involving mucus membranes or skin necrosis: No Has patient had a PCN reaction that required hospitalization No Has patient had a PCN reaction occurring within the last 10 years: No If all of the above answers are "NO", then may proceed with Cephalosporin use. 2   Levofloxacin Rash      Medication List    STOP taking these medications   predniSONE 10 MG (21) Tbpk tablet Commonly known as:  STERAPRED UNI-PAK 21 TAB     TAKE these medications   albuterol 108 (90 Base) MCG/ACT inhaler Commonly known as:  PROVENTIL HFA;VENTOLIN HFA Inhale into the lungs every 6 (six) hours as needed for wheezing or shortness of breath.   alendronate 70 MG tablet Commonly known as:  FOSAMAX Take 70 mg by mouth once a week.   aspirin 325 MG tablet Take 325 mg by mouth every 6 (six) hours as needed for moderate pain or headache.   benzonatate 100 MG capsule Commonly known as:  TESSALON Take 1 capsule (100 mg total) by mouth every 8 (eight) hours.   Cholecalciferol 1000 units capsule Take 1,000 Units by mouth daily.   cyclobenzaprine 5 MG tablet Commonly known as:  FLEXERIL Take 1 tablet (5 mg total) by mouth 3 (three) times daily as needed for muscle spasms.    cycloSPORINE 0.05 % ophthalmic emulsion Commonly known as:  RESTASIS Place 1 drop into both eyes 2 times daily as needed for dry eyes. Mostly takes at bedtime   dextromethorphan 30 MG/5ML liquid Commonly known as:  DELSYM Take 2.5 mLs (15 mg total) by mouth 2 (two) times daily as needed for cough.   diltiazem 120 MG 24 hr capsule Commonly known as:  CARDIZEM CD Take 120 mg by mouth daily.   fexofenadine 180 MG tablet Commonly known as:  ALLEGRA Take 180 mg by mouth daily.   FLUoxetine 40 MG capsule Commonly known as:  PROZAC Take 40 mg by mouth daily before breakfast.   fluticasone 50 MCG/ACT nasal spray Commonly known as:  FLONASE Place 1 spray into both nostrils daily as needed for allergies.   Fluticasone-Salmeterol 250-50 MCG/DOSE Aepb Commonly known as:  ADVAIR Inhale 1 puff into the lungs every 12 (twelve) hours.   hydrochlorothiazide 25 MG tablet Commonly known as:  HYDRODIURIL Take 1 tablet by mouth every 7 (seven) days.   omeprazole 40 MG capsule Commonly known as:  PRILOSEC Take 40 mg by mouth daily.   oseltamivir 30 MG capsule Commonly known as:  TAMIFLU Take 1 capsule (30 mg total) by mouth 2 (two) times daily.   potassium chloride SA 20 MEQ tablet Commonly known as:  K-DUR,KLOR-CON Take 20 mEq by mouth daily.   SPIRIVA HANDIHALER 18 MCG inhalation capsule Generic drug:  tiotropium Place 18 mcg into inhaler and inhale daily.      Allergies  Allergen Reactions  . Cortisone Other (See Comments)    headaches  . Penicillins Hives    Has patient had a PCN reaction causing immediate rash, facial/tongue/throat swelling, SOB or lightheadedness with hypotension: Yes Has patient had a PCN reaction causing severe rash involving mucus membranes or skin necrosis: No Has patient had a PCN reaction that required hospitalization No Has patient had a PCN reaction occurring within the last 10 years: No If all of the above answers are "NO", then may proceed with  Cephalosporin use. 2  . Levofloxacin Rash   Follow-up Information    [name], MD. Schedule an appointment as soon as possible for a visit in 2 week(s).   Specialty:  Family Medicine Contact information: [address] [place] [address] [phone]            The results of significant diagnostics from this hospitalization (including imaging, microbiology, ancillary and laboratory) are listed below for reference.    Significant Diagnostic Studies: Dg Chest 2 View  Result Date: 10/20/2016 CLINICAL DATA:  Productive cough with worsening shortness of breath, body aches and headaches for 2 days. EXAM: CHEST  2 VIEW COMPARISON:  10/11/2016 and 09/26/2016. FINDINGS: The heart size and mediastinal contours are stable. There is a probable hiatal hernia. There is improved aeration of the lung bases on the lateral view. There is chronic central airway thickening and interstitial prominence, but  no edema, focal airspace disease or pleural effusion. The bones appear unchanged. IMPRESSION: Stable chronic interstitial prominence and central airway thickening. No acute cardiopulmonary process. Electronically Signed   By: Carey BullocksWilliam  Veazey M.D.   On: 10/20/2016 19:10   Dg Chest 2 View  Result Date: 10/11/2016 CLINICAL DATA:  Complaints of generalized muscle cramping, tremors, chest pain, and lightheadedness that started after taking potassium. Pt reports that she had some tremors and muscle cramping before and was diagnosed with hypokalemia. Pt repor.*comment was truncated* EXAM: CHEST  2 VIEW COMPARISON:  None. FINDINGS: Normal mediastinum and cardiac silhouette. Normal pulmonary vasculature. No evidence of effusion, infiltrate, or pneumothorax. No acute bony abnormality. IMPRESSION: No acute cardiopulmonary process. Electronically Signed   By: Genevive BiStewart  Edmunds M.D.   On: 10/11/2016 20:50   Dg Chest 2 View  Result Date: 09/26/2016 CLINICAL DATA:  SOB, Pt reports sob for a few weeks,  productive cough, headache. Pt was seen by pcp and was given nebulizer treatments to give self at home without relief HISTORY OF ASTHMA, HTN, COPD, PNEUMONIA, GERD, ANXIETY EXAM: CHEST - 2 VIEW COMPARISON:  09/18/2016 FINDINGS: Somewhat coarse and attenuated bronchovascular markings particularly in the upper lobes, as before. No focal infiltrate or overt edema. Heart size and mediastinal contours are within normal limits. Atheromatous aorta.  No pneumothorax.  No pleural effusion. Visualized bones unremarkable. Surgical clips in the upper abdomen. IMPRESSION: 1. Stable hyperinflation without acute or superimposed abnormality Electronically Signed   By: Corlis Leak  Hassell M.D.   On: 09/26/2016 15:06   Ct Angio Chest Pe W And/or Wo Contrast  Result Date: 10/20/2016 CLINICAL DATA:  Pt. Was recently admitted for a COPD exacerbation. Has been on and off steroids and antibiotics. Pt. states fevers up to 100 at home. States her Temperature normally runs low. Slight dull chest pain. Some swelling and left leg EXAM: CT ANGIOGRAPHY CHEST WITH CONTRAST TECHNIQUE: Multidetector CT imaging of the chest was performed using the standard protocol during bolus administration of intravenous contrast. Multiplanar CT image reconstructions and MIPs were obtained to evaluate the vascular anatomy. CONTRAST:  80 cc Isovue 370 COMPARISON:  Chest x-ray from earlier same day FINDINGS: Cardiovascular: Some of the most peripheral segmental and subsegmental pulmonary artery branches are difficult to definitively characterize due to patient breathing motion artifact but there is no convincing pulmonary embolism identified within the main, lobar or central segmental pulmonary arteries bilaterally. Scattered atherosclerotic changes of the normal caliber thoracic aorta. No aortic aneurysm or dissection. Heart size is normal. No pericardial effusion. Mediastinum/Nodes: There is a moderate-sized hiatal hernia. There is at least moderate distention of the  thoracic esophagus which contains fluid to the level of the upper mediastinum. No mass or enlarged lymph nodes seen within the mediastinum or perihilar regions. Trachea and central bronchi are unremarkable. Lungs/Pleura: Emphysematous changes bilaterally, at least moderate in degree, upper lobe predominant. 7 mm pulmonary nodule identified within the superior segment of the left lower lobe (series 6, image 42). No evidence of pneumonia. No pleural effusion or pneumothorax seen. Upper Abdomen: No acute abnormality identified within the upper abdomen Musculoskeletal: Mild degenerative change within the thoracic spine. No acute or suspicious osseous finding. Superficial soft tissues are unremarkable. Review of the MIP images confirms the above findings. IMPRESSION: 1. No evidence of pulmonary embolism, with mild study limitations detailed above. 2. Moderate-sized hiatal hernia. Esophagus is moderately distended above the level of the hiatal hernia, partially obstructed versus chronically patulous, with the esophagus containing fluid-like material to the level  of the upper/mid mediastinum which raises the possibility of aspiration event. 3. No aortic aneurysm or dissection.  Heart size is normal. 4. Emphysema, at least moderate in degree, upper lobe predominant. 5. 7 mm pulmonary nodule within the superior segment of the left lower lobe. Non-contrast chest CT at 6-12 months is recommended. If the nodule is stable at time of repeat CT, then future CT at 18-24 months (from today's scan) is considered optional for low-risk patients, but is recommended for high-risk patients. Patient is presumably high risk given the emphysema. This recommendation follows the consensus statement: Guidelines for Management of Incidental Pulmonary Nodules Detected on CT Images: From the Fleischner Society 2017; Radiology 2017; 284:228-243. 6. No evidence of pneumonia.  No pleural effusion. Electronically Signed   By: Bary Richard M.D.   On:  10/20/2016 21:54   US Venous Img Lower Bilateral  Result Date: 10/21/2016 CLINICAL DATA:  Chronic left lower extremity edema. EXAM: BILATERAL LOWER EXTREMITY VENOUS DOPPLER ULTRASOUND TECHNIQUE: Gray-scale sonography with graded compression, as well as color Doppler and duplex ultrasound were performed to evaluate the lower extremity deep venous systems from the level of the common femoral vein and including the common femoral, femoral, profunda femoral, popliteal and calf veins including the posterior tibial, peroneal and gastrocnemius veins when visible. The superficial great saphenous vein was also interrogated. Spectral Doppler was utilized to evaluate flow at rest and with distal augmentation maneuvers in the common femoral, femoral and popliteal veins. COMPARISON:  None. FINDINGS: RIGHT LOWER EXTREMITY Common Femoral Vein: No evidence of thrombus. Normal compressibility, respiratory phasicity and response to augmentation. Saphenofemoral Junction: No evidence of thrombus. Normal compressibility and flow on color Doppler imaging. Profunda Femoral Vein: No evidence of thrombus. Normal compressibility and flow on color Doppler imaging. Femoral Vein: No evidence of thrombus. Normal compressibility, respiratory phasicity and response to augmentation. Popliteal Vein: No evidence of thrombus. Normal compressibility, respiratory phasicity and response to augmentation. Calf Veins: Visualized right deep calf veins are patent without thrombus. Superficial Great Saphenous Vein: Visualized right great saphenous vein is compressible without thrombus. Other Findings: There is a complex fluid collection in the right popliteal fossa compatible with a Baker's cyst. This measures 6.1 x 2.6 x 5.2 cm. LEFT LOWER EXTREMITY Common Femoral Vein: No evidence of thrombus. Normal compressibility, respiratory phasicity and response to augmentation. Saphenofemoral Junction: No evidence of thrombus. Normal compressibility and flow on  color Doppler imaging. Profunda Femoral Vein: No evidence of thrombus. Normal compressibility and flow on color Doppler imaging. Femoral Vein: No evidence of thrombus. Normal compressibility, respiratory phasicity and response to augmentation. Popliteal Vein: No evidence of thrombus. Normal compressibility, respiratory phasicity and response to augmentation. Calf Veins: Visualized left deep calf veins are patent without thrombus. Superficial Great Saphenous Vein: Visualized left great saphenous vein is compressible without thrombus. Other Findings:  None. IMPRESSION: No evidence of deep venous thrombosis. Right Baker's cyst measuring up to 6.1 cm. Electronically Signed   By: Richarda Overlie M.D.   On: 10/21/2016 10:52    Microbiology: No results found for this or any previous visit (from the past 240 hour(s)).   Labs: Basic Metabolic Panel:  Recent Labs Lab 10/20/16 1951 10/21/16 0508  NA 136 134*  K 4.0 4.0  CL 104 103  CO2 24 22  GLUCOSE 128* 132*  BUN 11 12  CREATININE 1.42* 1.40*  CALCIUM 9.1 8.9   Liver Function Tests:  Recent Labs Lab 10/21/16 0508  AST 27  ALT 27  ALKPHOS 81  BILITOT  0.6  PROT 6.5  ALBUMIN 3.1*   No results for input(s): LIPASE, AMYLASE in the last 168 hours. No results for input(s): AMMONIA in the last 168 hours. CBC:  Recent Labs Lab 10/20/16 1951 10/21/16 0508  WBC 6.4 6.6  NEUTROABS 5.2  --   HGB 11.1* 11.4*  HCT 34.3* 35.4*  MCV 89.8 88.7  PLT 238 253   Cardiac Enzymes: No results for input(s): CKTOTAL, CKMB, CKMBINDEX, TROPONINI in the last 168 hours. BNP: BNP (last 3 results) No results for input(s): BNP in the last 8760 hours.  ProBNP (last 3 results) No results for input(s): PROBNP in the last 8760 hours.  CBG: No results for input(s): GLUCAP in the last 168 hours.     SignedChaya Jan  [facility] Pager: [phone] 10/21/2016, 4:05 PM

## 2016-11-05 ENCOUNTER — Encounter: Payer: Self-pay | Admitting: Physician Assistant

## 2016-11-13 ENCOUNTER — Ambulatory Visit (INDEPENDENT_AMBULATORY_CARE_PROVIDER_SITE_OTHER): Payer: Medicare Other | Admitting: Physician Assistant

## 2016-11-13 ENCOUNTER — Encounter: Payer: Self-pay | Admitting: Physician Assistant

## 2016-11-13 VITALS — BP 120/70 | HR 80

## 2016-11-13 DIAGNOSIS — R053 Chronic cough: Secondary | ICD-10-CM

## 2016-11-13 DIAGNOSIS — R938 Abnormal findings on diagnostic imaging of other specified body structures: Secondary | ICD-10-CM | POA: Diagnosis not present

## 2016-11-13 DIAGNOSIS — R05 Cough: Secondary | ICD-10-CM

## 2016-11-13 DIAGNOSIS — R1319 Other dysphagia: Secondary | ICD-10-CM

## 2016-11-13 DIAGNOSIS — K219 Gastro-esophageal reflux disease without esophagitis: Secondary | ICD-10-CM

## 2016-11-13 DIAGNOSIS — R9389 Abnormal findings on diagnostic imaging of other specified body structures: Secondary | ICD-10-CM

## 2016-11-13 NOTE — Patient Instructions (Signed)
Continue Omeprazole 40 mg Continue the Carafate- 1 gram as liquid, twicd daily.  Strict anti-reflux Regimen. We have given you brochures with this information.  You have been scheduled for an endoscopy. Please follow written instructions given to you at your visit today. If you use inhalers (even only as needed), please bring them with you on the day of your procedure.

## 2016-11-13 NOTE — Progress Notes (Signed)
Subjective:    Patient ID: Deanna Acosta, female    DOB: Mar 28, 1936, 81 y.o.   MRN: [id number]  HPI Deanna Acosta is a pleasant 81 year old white female, known to Dr. Marina GoodellPerry. She has history of hypertension, COPD, chronic kidney disease, diverticulosis and history of adenomatous colon polyps. She last had colonoscopy in October 2012. She was found to have moderate diverticulosis and no polyps at that time and was recommended for 5 year interval follow-up. She had very remote EGD in 1997 with finding of erosive esophagitis and a stricture. She also had a distal esophageal diverticulum and therefore stricture was not dilated at that time. Patient comes in in today after recent admission 1:15 3 10/21/2016. Add presented with cough and weakness. She was felt to have a COPD exacerbation and also had influenza and was treated with Tamiflu. She had CT and Geodon when she presented to the emergency room and this did show a moderate hiatal hernia with at least moderate distention of the thoracic esophagus which pains fluid to the level of the upper mediastinum there was no mass or enlarged lymph nodes and also had emphysematous changes bilaterally. Question partially obstructed esophagus versus chronically patulous, raising possibility of aspiration event.  Patient states that she has had chronic problems with heartburn and indigestion. She has been on Prilosec 40 mg long-term. She had Carafate 1 g liquid added to her regimen at the time of hospitalization. She says she has frequent problems with sour brash type symptoms and has had a sense of food sitting in her esophagus with about every meal over the past several months. Not had any regurgitation episodes but says she has to stop eating and wait for the food to go down. Appetite has been okay. She has not had any significant weight loss, no complaints of abdominal pain. He mentions that she is to have a spinal stimulator placed in a few weeks and would like to  have GI workup done prior to that.  Review of Systems Pertinent positive and negative review of systems were noted in the above HPI section.  All other review of systems was otherwise negative.  Outpatient Encounter Prescriptions as of 11/13/2016  Medication Sig  . albuterol (PROVENTIL HFA;VENTOLIN HFA) 108 (90 Base) MCG/ACT inhaler Inhale into the lungs every 6 (six) hours as needed for wheezing or shortness of breath.  . benzonatate (TESSALON) 100 MG capsule Take 1 capsule (100 mg total) by mouth every 8 (eight) hours. (Patient taking differently: Take 1-2 tablet as needed)  . Cholecalciferol 1000 units capsule Take 1,000 Units by mouth daily.  . cycloSPORINE (RESTASIS) 0.05 % ophthalmic emulsion Place 1 drop into both eyes 2 times daily as needed for dry eyes. Mostly takes at bedtime  . dextromethorphan (DELSYM) 30 MG/5ML liquid Take 2.5 mLs (15 mg total) by mouth 2 (two) times daily as needed for cough.  . diltiazem (CARDIZEM CD) 120 MG 24 hr capsule Take 120 mg by mouth daily.   . fexofenadine (ALLEGRA) 180 MG tablet Take 180 mg by mouth daily as needed.   Marland Kitchen. FLUoxetine (PROZAC) 40 MG capsule Take 40 mg by mouth daily before breakfast.   . fluticasone (FLONASE) 50 MCG/ACT nasal spray Place 1 spray into both nostrils daily as needed for allergies.   . Fluticasone-Salmeterol (ADVAIR) 250-50 MCG/DOSE AEPB Inhale 1 puff into the lungs every 12 (twelve) hours.  Marland Kitchen. omeprazole (PRILOSEC) 40 MG capsule Take 40 mg by mouth daily as needed.   . potassium chloride SA (K-DUR,KLOR-CON)  20 MEQ tablet Take 20 mEq by mouth daily.  Marland Kitchen SPIRIVA HANDIHALER 18 MCG inhalation capsule Place 18 mcg into inhaler and inhale daily.   . [DISCONTINUED] alendronate (FOSAMAX) 70 MG tablet Take 70 mg by mouth once a week.   . [DISCONTINUED] aspirin 325 MG tablet Take 325 mg by mouth every 6 (six) hours as needed for moderate pain or headache.  . [DISCONTINUED] cyclobenzaprine (FLEXERIL) 5 MG tablet Take 1 tablet (5 mg total)  by mouth 3 (three) times daily as needed for muscle spasms.  . [DISCONTINUED] hydrochlorothiazide (HYDRODIURIL) 25 MG tablet Take 1 tablet by mouth every 7 (seven) days.   . [DISCONTINUED] oseltamivir (TAMIFLU) 30 MG capsule Take 1 capsule (30 mg total) by mouth 2 (two) times daily.   No facility-administered encounter medications on file as of 11/13/2016.    Allergies  Allergen Reactions  . Cortisone Other (See Comments)    headaches  . Penicillins Hives    Has patient had a PCN reaction causing immediate rash, facial/tongue/throat swelling, SOB or lightheadedness with hypotension: Yes Has patient had a PCN reaction causing severe rash involving mucus membranes or skin necrosis: No Has patient had a PCN reaction that required hospitalization No Has patient had a PCN reaction occurring within the last 10 years: No If all of the above answers are "NO", then may proceed with Cephalosporin use. 2  . Levofloxacin Rash   Patient Active Problem List   Diagnosis Date Noted  . Influenza A 10/21/2016  . COPD with acute exacerbation (HCC) 09/26/2016  . Headache(784.0) 09/24/2012  . COPD exacerbation (HCC) 09/23/2012  . Hypoxemia 09/23/2012  . Vomiting 09/23/2012  . Dehydration 09/23/2012  . HTN (hypertension) 09/23/2012  . Chronic kidney disease, stage III (moderate) 09/23/2012   Social History   Social History  . Marital status: Married    Spouse name: N/A  . Number of children: N/A  . Years of education: N/A   Occupational History  . Not on file.   Social History Main Topics  . Smoking status: Former Smoker    Packs/day: 2.00    Years: 54.00    Types: Cigarettes    Quit date: 02/25/1999  . Smokeless tobacco: Never Used  . Alcohol use Yes     Comment: only on anniversary   . Drug use: No  . Sexual activity: Yes    Birth control/ protection: Surgical   Other Topics Concern  . Not on file   Social History Narrative  . No narrative on file    Ms. Cirigliano's family history  includes Cancer in her father; Ulcers in her father.      Objective:    Vitals:   11/13/16 1405  BP: 120/70  Pulse: 80    Physical Exam  well-developed older white female in no acute distress, accompanied by her husband, pleasant blood pressure 120/70 pulse 80, height 5 foot 4, weight 171, BMI 29.4. HEENT; nontraumatic normocephalic EOMI PERRLA sclera anicteric, Cardiovascular; regular rate and rhythm with S1-S2 no murmur or gallop, Pulmonary ;scattered rhonchi, Abdomen; soft, nontender nondistended bowel sounds are active no palpable mass or hepatosplenomegaly on Rectal; exam not done, Ext; no clubbing cyanosis or edema skin warm and dry, Neuropsych ;mood and affect appropriate       Assessment & Plan:   #5 81 year old white female with chronic GERD, very remote EGD showing erosive esophagitis and distal stricture was distal esophageal diverticulum. Now with worsening heartburn, sour brash and dysphagia for several months. Exline CT and Geodon  on 10/20/2006 when patient presented with chronic cough 2 months showing a moderate hiatal hernia and a dilated thoracic esophagus containing fluid to the level of the upper mediastinum. Rule out esophageal stricture, rule out underlying motility disorder, either may have contributed to an aspiration event #2 COPD #3 recent influenza Number for chronic kidney disease #5 history of adenomatous colon polyps-A she is due for follow-up colonoscopy, she would like to delay this until she gets her esophageal and back issues under control.  Plan; continue omeprazole 40 mg by mouth every morning Continue Carafate 1 g by mouth twice a day, she is using tablets and making them into a slurry Strict antireflux regimen including elevation of the head of the bed Will schedule for EGD with possible esophageal dilation with Dr. Marina Goodell. Procedure discussed in detail with patient including risks and benefits and she is agreeable to proceed.  We'll need to  readdress colonoscopy in a few months.   Youcef Klas Oswald Hillock PA-C 11/13/2016   Cc: Daisy Floro, MD

## 2016-11-14 NOTE — Progress Notes (Signed)
Reviewed. Agree with assessment and plans. I do not think she needs future colonoscopy

## 2016-11-17 ENCOUNTER — Ambulatory Visit (AMBULATORY_SURGERY_CENTER): Payer: Medicare Other | Admitting: Internal Medicine

## 2016-11-17 ENCOUNTER — Encounter: Payer: Self-pay | Admitting: Internal Medicine

## 2016-11-17 VITALS — BP 143/71 | HR 92 | Temp 96.2°F | Resp 18 | Ht 64.0 in | Wt 192.0 lb

## 2016-11-17 DIAGNOSIS — K219 Gastro-esophageal reflux disease without esophagitis: Secondary | ICD-10-CM | POA: Diagnosis not present

## 2016-11-17 DIAGNOSIS — R1310 Dysphagia, unspecified: Secondary | ICD-10-CM

## 2016-11-17 MED ORDER — SODIUM CHLORIDE 0.9 % IV SOLN: 500.0000 mL | INTRAVENOUS | Status: AC

## 2016-11-17 NOTE — Patient Instructions (Signed)
Impression/Recommendations:  Small portion meals more frequently. Food should consist of liquids and soft items.   Advise to stay away from stringy meats and stringy vegetables unless pureed. Sit up while eating.  Do not eat within 4 hours of going to bed.   Elevate the head of the bed on 6 inch blocks.  Continue present medications.  YOU HAD AN ENDOSCOPIC PROCEDURE TODAY AT THE [facility]:   Refer to the procedure report that was given to you for any specific questions about what was found during the examination.  If the procedure report does not answer your questions, please call your gastroenterologist to clarify.  If you requested that your care partner not be given the details of your procedure findings, then the procedure report has been included in a sealed envelope for you to review at your convenience later.  YOU SHOULD EXPECT: Some feelings of bloating in the abdomen. Passage of more gas than usual.  Walking can help get rid of the air that was put into your GI tract during the procedure and reduce the bloating. If you had a lower endoscopy (such as a colonoscopy or flexible sigmoidoscopy) you may notice spotting of blood in your stool or on the toilet paper. If you underwent a bowel prep for your procedure, you may not have a normal bowel movement for a few days.  Please Note:  You might notice some irritation and congestion in your nose or some drainage.  This is from the oxygen used during your procedure.  There is no need for concern and it should clear up in a day or so.  SYMPTOMS TO REPORT IMMEDIATELY:  Following upper endoscopy (EGD)  Vomiting of blood or coffee ground material  New chest pain or pain under the shoulder blades  Painful or persistently difficult swallowing  New shortness of breath  Fever of 100F or higher  Black, tarry-looking stools  For urgent or emergent issues, a gastroenterologist can be reached at any hour by calling [phone]  [phone].   DIET:  We do recommend a small meal at first, but then you may proceed to your regular diet.  Drink plenty of fluids but you should avoid alcoholic beverages for 24 hours.  ACTIVITY:  You should plan to take it easy for the rest of today and you should NOT DRIVE or use heavy machinery until tomorrow (because of the sedation medicines used during the test).    FOLLOW UP: Our staff will call the number listed on your records the next business day following your procedure to check on you and address any questions or concerns that you may have regarding the information given to you following your procedure. If we do not reach you, we will leave a message.  However, if you are feeling well and you are not experiencing any problems, there is no need to return our call.  We will assume that you have returned to your regular daily activities without incident.  If any biopsies were taken you will be contacted by phone or by letter within the next 1-3 weeks.  Please call us at [phone] if you have not heard about the biopsies in 3 weeks.    SIGNATURES/CONFIDENTIALITY: You and/or your care partner have signed paperwork which will be entered into your electronic medical record.  These signatures attest to the fact that that the information above on your After Visit Summary has been reviewed and is understood.  Full responsibility of the confidentiality of this  discharge information lies with you and/or your care-partner. 

## 2016-11-17 NOTE — Op Note (Signed)
[facility] Patient Name: Deanna Acosta Procedure Date: 11/17/2016 10:15 AM MRN: [id number] Endoscopist: Wilhemina Bonito. Marina Goodell , MD Age: 81 Referring MD:  Date of Birth: Nov 28, 1935 Gender: Female Account #: [id number] Procedure:                Upper GI endoscopy Indications:              Dysphagia, Esophageal reflux, Abnormal CT of the GI                            tract Medicines:                Monitored Anesthesia Care Procedure:                Pre-Anesthesia Assessment:                           - Prior to the procedure, a History and Physical                            was performed, and patient medications and                            allergies were reviewed. The patient's tolerance of                            previous anesthesia was also reviewed. The risks                            and benefits of the procedure and the sedation                            options and risks were discussed with the patient.                            All questions were answered, and informed consent                            was obtained. Prior Anticoagulants: The patient has                            taken no previous anticoagulant or antiplatelet                            agents. ASA Grade Assessment: II - A patient with                            mild systemic disease. After reviewing the risks                            and benefits, the patient was deemed in                            satisfactory condition to undergo the procedure.  After obtaining informed consent, the endoscope was                            passed under direct vision. Throughout the                            procedure, the patient's blood pressure, pulse, and                            oxygen saturations were monitored continuously. The                            Model GIF-[id number]) scope was introduced                            through the mouth, and advanced to the  second part                            of duodenum. The upper GI endoscopy was                            accomplished without difficulty. The patient                            tolerated the procedure well. Scope In: Scope Out: Findings:                 The esophagus was serpiginous and dilated with                            large food bolus in the distal most portion. This                            was gently advanced into the stomach. The                            gastroesophageal junction was widely patent without                            stricture. Actually patulous.                           The stomach was normal, except for a moderate-sized                            hiatal hernia.                           The examined duodenum was normal.                           The cardia and gastric fundus were normal on                            retroflexion. Complications:  No immediate complications. Estimated Blood Loss:     Estimated blood loss: none. Impression:               - Esophagus demonstrating severe dysmotility with                            patulous GE junction and retained foodstuff as                            described.                           - Normal stomach, except for hiatal hernia.                           - Normal examined duodenum.                           - No specimens collected. Recommendation:           - Recommend small portion meals more frequently                           - Food should consist of liquids and soft items.                            Would stay away from stringy meats and stringy                            vegetables unless pured.                           - Setup while eating. Do not eat within 4 hours of                            going to bed. Elevating head of bed on 6 inch                            blocks.                           - Continue present medications. Wilhemina Bonito. Marina Goodell, MD 11/17/2016 10:39:32 AM This  report has been signed electronically.

## 2016-11-17 NOTE — Progress Notes (Signed)
Report given to PACU, vss 

## 2016-11-17 NOTE — Progress Notes (Signed)
Albuterol given to pt by CRNA 2 puffs, robinal .1 mg given IV. vss

## 2016-11-18 ENCOUNTER — Telehealth: Payer: Self-pay

## 2016-11-18 NOTE — Telephone Encounter (Signed)
  Follow up Call-  Call back number 11/17/2016  Post procedure Call Back phone  # #[phone]  hm  Permission to leave phone message Yes  Some recent data might be hidden     Patient questions:  Do you have a fever, pain , or abdominal swelling? No. Pain Score  0 *  Have you tolerated food without any problems? Yes.    Have you been able to return to your normal activities? Yes.    Do you have any questions about your discharge instructions: Diet   No. Medications  No. Follow up visit  No.  Do you have questions or concerns about your Care? No.  Actions: * If pain score is 4 or above: No action needed, pain <4.

## 2016-12-08 ENCOUNTER — Ambulatory Visit (INDEPENDENT_AMBULATORY_CARE_PROVIDER_SITE_OTHER): Payer: Medicare Other | Admitting: Vascular Surgery

## 2016-12-08 ENCOUNTER — Encounter: Payer: Self-pay | Admitting: Vascular Surgery

## 2016-12-08 VITALS — BP 140/75 | HR 77 | Temp 97.0°F | Resp 18 | Ht 64.0 in | Wt 194.4 lb

## 2016-12-08 DIAGNOSIS — I83893 Varicose veins of bilateral lower extremities with other complications: Secondary | ICD-10-CM | POA: Insufficient documentation

## 2016-12-08 NOTE — Progress Notes (Signed)
Subjective:     Patient ID: Deanna Acosta, female   DOB: [birth year], 81 y.o.   MRN: [id number]  HPI This 81 year old female is evaluated for bilateral painful varicosities. She has no history of DVT, but does have a remote history of thrombophlebitis but not sure which leg. She had a left knee replacement a few years ago. She has been developing progressive edema in both legs left worse than right. She has also had ulceration lateral aspect of her left lower leg which has not healed in the past year despite some treatment with "ointment" from a dermatologist. She does not elastic compression stockings. She develops aching throbbing and burning discomfort in both legs particularly in the calves which worsens as the day progresses. This is affecting her daily living.  Past Medical History:  Diagnosis Date  . Allergy    seasonal  . Anxiety   . Arthritis   . Asthma    bronchitis  . Blood clot in vein    LEG    YEARS AGO  . Blood dyscrasia    ELEVATED WBC  HX  . Bronchitis   . Cataract    removed bilaterally   . Clotting disorder (HCC)    clot in leg   . COPD (chronic obstructive pulmonary disease) (HCC)   . Depression   . Flu    Oct 27, 2016 - admitted to [facility] for one day per pt  . GERD (gastroesophageal reflux disease)   . Hyperlipidemia   . Hypertension   . Osteoporosis   . Pneumonia    2013  . RSD (reflex sympathetic dystrophy)   . Shortness of breath   . Ulcer (HCC)    H-pylorie treated    Social History  Substance Use Topics  . Smoking status: Former Smoker    Packs/day: 2.00    Years: 54.00    Types: Cigarettes    Quit date: 02/25/1999  . Smokeless tobacco: Never Used     Comment: chews nicotine gum  . Alcohol use Yes     Comment: only on anniversary     Family History  Problem Relation Age of Onset  . Ulcers Father     bleeding/gastric  . Cancer Father   . Colon cancer Neg Hx   . Esophageal cancer Neg Hx   . Stomach cancer Neg Hx   . Rectal  cancer Neg Hx     Allergies  Allergen Reactions  . Cortisone Other (See Comments)    headaches  . Penicillins Hives    Has patient had a PCN reaction causing immediate rash, facial/tongue/throat swelling, SOB or lightheadedness with hypotension: Yes Has patient had a PCN reaction causing severe rash involving mucus membranes or skin necrosis: No Has patient had a PCN reaction that required hospitalization No Has patient had a PCN reaction occurring within the last 10 years: No If all of the above answers are "NO", then may proceed with Cephalosporin use. 2  . Levofloxacin Rash     Current Outpatient Prescriptions:  .  albuterol (PROVENTIL HFA;VENTOLIN HFA) 108 (90 Base) MCG/ACT inhaler, Inhale into the lungs every 6 (six) hours as needed for wheezing or shortness of breath., Disp: , Rfl:  .  benzonatate (TESSALON) 100 MG capsule, Take 1 capsule (100 mg total) by mouth every 8 (eight) hours. (Patient taking differently: Take 1-2 tablet as needed), Disp: 21 capsule, Rfl: 0 .  Cholecalciferol 1000 units capsule, Take 1,000 Units by mouth daily., Disp: , Rfl:  .  cycloSPORINE (RESTASIS) 0.05 % ophthalmic emulsion, Place 1 drop into both eyes 2 times daily as needed for dry eyes. Mostly takes at bedtime, Disp: , Rfl:  .  FLUoxetine (PROZAC) 40 MG capsule, Take 40 mg by mouth daily before breakfast. , Disp: , Rfl:  .  fluticasone (FLONASE) 50 MCG/ACT nasal spray, Place 1 spray into both nostrils daily as needed for allergies. , Disp: , Rfl: 0 .  Fluticasone-Salmeterol (ADVAIR) 250-50 MCG/DOSE AEPB, Inhale 1 puff into the lungs every 12 (twelve) hours., Disp: , Rfl:  .  omeprazole (PRILOSEC) 40 MG capsule, Take 40 mg by mouth daily as needed. , Disp: , Rfl:  .  potassium chloride SA (K-DUR,KLOR-CON) 20 MEQ tablet, Take 20 mEq by mouth daily., Disp: , Rfl:  .  SPIRIVA HANDIHALER 18 MCG inhalation capsule, Place 18 mcg into inhaler and inhale daily. , Disp: , Rfl: 0 .  dextromethorphan (DELSYM)  30 MG/5ML liquid, Take 2.5 mLs (15 mg total) by mouth 2 (two) times daily as needed for cough. (Patient not taking: Reported on 11/17/2016), Disp: 89 mL, Rfl: 0 .  diltiazem (CARDIZEM CD) 120 MG 24 hr capsule, Take 120 mg by mouth daily. , Disp: , Rfl: 1 .  fexofenadine (ALLEGRA) 180 MG tablet, Take 180 mg by mouth daily as needed. , Disp: , Rfl:   Current Facility-Administered Medications:  .  0.9 %  sodium chloride infusion, 500 mL, Intravenous, Continuous, Hilarie Fredrickson, MD  Vitals:   12/08/16 1013  BP: 140/75  Pulse: 77  Resp: 18  Temp: 97 F (36.1 C)  TempSrc: Oral  SpO2: 96%  Weight: 194 lb 6.4 oz (88.2 kg)  Height: 5\' 4"  (1.626 m)    Body mass index is 33.37 kg/m.         Review of Systems denies chest pain but does have mild dyspnea on exertion due to COPD. Quit smoking 20 years ago. Has history of anxiety. Other systems negative and complete review of systems     Objective:   Physical Exam BP 140/75 (BP Location: Left Arm, Patient Position: Sitting, Cuff Size: Normal)   Pulse 77   Temp 97 F (36.1 C) (Oral)   Resp 18   Ht 5\' 4"  (1.626 m)   Wt 194 lb 6.4 oz (88.2 kg)   SpO2 96%   BMI 33.37 kg/m     Gen.-alert and oriented x3 in no apparent distress HEENT normal for age Lungs no rhonchi or wheezing Cardiovascular regular rhythm no murmurs carotid pulses 3+ palpable no bruits audible Abdomen soft nontender no palpable masses Musculoskeletal free of  major deformities Skin clear -no rashes Neurologic normal Lower extremities 3+ femoral and dorsalis pedis pulses palpable bilaterally with 1+ edema bilaterally Large bulging varicosities in the right medial calf over the great saphenous system with extensive reticular network in the right Peri- malleolar area Left leg with superficial ulceration lateral lower leg about 8 cm proximal lateral malleolus measuring about 1 cm in diameter. Bulging varicosities medial thigh and calf. Extensive reticular network left  medial malleolar area.  Today I performed a bedside SonoSite ultrasound exam which revealed large great saphenous veins bilaterally with gross reflux supplying these painful varicosities       Assessment:     Bilateral painful varicosities with swelling causing pain with nonhealing ulcer left lateral leg due to gross reflux bilateral great saphenous veins. This is causing symptoms which are affecting patient's daily living.    Plan:         #  1 long leg elastic compression stockings 20-30 mm gradient #2 elevate legs as much as possible #3 ibuprofen daily on a regular basis for pain #4 return in 3 months-she will health formal venous reflux exam performed on return and we will make formal recommendation She will likely need bilateral great saphenous vein laser ablation procedures followed by three-month waiting period and possible stab phlebectomy and/or sclerotherapy at that time Return in 3 months

## 2016-12-15 NOTE — Addendum Note (Signed)
Addended by: Burton ApleyPETTY, Finnlee Silvernail A on: 12/15/2016 09:22 AM   Modules accepted: Orders

## 2016-12-19 ENCOUNTER — Other Ambulatory Visit: Payer: Self-pay | Admitting: Family Medicine

## 2016-12-19 DIAGNOSIS — Z1231 Encounter for screening mammogram for malignant neoplasm of breast: Secondary | ICD-10-CM

## 2016-12-30 ENCOUNTER — Ambulatory Visit: Payer: Medicare Other

## 2017-01-06 ENCOUNTER — Ambulatory Visit: Payer: Medicare Other

## 2017-01-21 ENCOUNTER — Ambulatory Visit
Admission: RE | Admit: 2017-01-21 | Discharge: 2017-01-21 | Disposition: A | Discharge status: Home / Self Care | Payer: Medicare Other | Source: Ambulatory Visit | Attending: Family Medicine | Admitting: Family Medicine

## 2017-01-21 DIAGNOSIS — Z1231 Encounter for screening mammogram for malignant neoplasm of breast: Secondary | ICD-10-CM

## 2017-02-15 ENCOUNTER — Encounter (HOSPITAL_COMMUNITY): Payer: Self-pay | Admitting: Emergency Medicine

## 2017-02-15 ENCOUNTER — Emergency Department (HOSPITAL_COMMUNITY)
Admission: EM | Admit: 2017-02-15 | Discharge: 2017-02-15 | Disposition: A | Discharge status: Home / Self Care | Payer: Medicare Other | Attending: Emergency Medicine | Admitting: Emergency Medicine

## 2017-02-15 ENCOUNTER — Emergency Department (HOSPITAL_COMMUNITY): Payer: Medicare Other

## 2017-02-15 DIAGNOSIS — S22080A Wedge compression fracture of T11-T12 vertebra, initial encounter for closed fracture: Secondary | ICD-10-CM

## 2017-02-15 DIAGNOSIS — Z96652 Presence of left artificial knee joint: Secondary | ICD-10-CM | POA: Insufficient documentation

## 2017-02-15 DIAGNOSIS — Y9389 Activity, other specified: Secondary | ICD-10-CM | POA: Insufficient documentation

## 2017-02-15 DIAGNOSIS — N183 Chronic kidney disease, stage 3 (moderate): Secondary | ICD-10-CM | POA: Insufficient documentation

## 2017-02-15 DIAGNOSIS — Y9289 Other specified places as the place of occurrence of the external cause: Secondary | ICD-10-CM | POA: Insufficient documentation

## 2017-02-15 DIAGNOSIS — S22088A Other fracture of T11-T12 vertebra, initial encounter for closed fracture: Secondary | ICD-10-CM | POA: Insufficient documentation

## 2017-02-15 DIAGNOSIS — X500XXA Overexertion from strenuous movement or load, initial encounter: Secondary | ICD-10-CM | POA: Diagnosis not present

## 2017-02-15 DIAGNOSIS — R52 Pain, unspecified: Secondary | ICD-10-CM

## 2017-02-15 DIAGNOSIS — Z79899 Other long term (current) drug therapy: Secondary | ICD-10-CM | POA: Insufficient documentation

## 2017-02-15 DIAGNOSIS — J449 Chronic obstructive pulmonary disease, unspecified: Secondary | ICD-10-CM | POA: Diagnosis not present

## 2017-02-15 DIAGNOSIS — Z87891 Personal history of nicotine dependence: Secondary | ICD-10-CM | POA: Diagnosis not present

## 2017-02-15 DIAGNOSIS — I129 Hypertensive chronic kidney disease with stage 1 through stage 4 chronic kidney disease, or unspecified chronic kidney disease: Secondary | ICD-10-CM | POA: Insufficient documentation

## 2017-02-15 DIAGNOSIS — Y999 Unspecified external cause status: Secondary | ICD-10-CM | POA: Insufficient documentation

## 2017-02-15 DIAGNOSIS — S299XXA Unspecified injury of thorax, initial encounter: Secondary | ICD-10-CM | POA: Diagnosis present

## 2017-02-15 MED ORDER — MAGNESIUM CITRATE PO SOLN: 1.0000 | Freq: Once | ORAL | 0 refills | Status: DC | PRN

## 2017-02-15 MED ORDER — POLYETHYLENE GLYCOL 3350 17 GM/SCOOP PO POWD: 17.0000 g | Freq: Every day | ORAL | 0 refills | Status: DC

## 2017-02-15 MED ORDER — OXYCODONE-ACETAMINOPHEN 5-325 MG PO TABS: 1.0000 | ORAL_TABLET | ORAL | 0 refills | Status: DC | PRN

## 2017-02-15 MED ORDER — OXYCODONE-ACETAMINOPHEN 5-325 MG PO TABS
1.0000 | ORAL_TABLET | Freq: Once | ORAL | Status: AC
  Administered 2017-02-15: 1 via ORAL
  Filled 2017-02-15: qty 1

## 2017-02-15 MED ORDER — SENNOSIDES-DOCUSATE SODIUM 8.6-50 MG PO TABS: 1.0000 | ORAL_TABLET | Freq: Two times a day (BID) | ORAL | 0 refills | Status: DC

## 2017-02-15 NOTE — ED Triage Notes (Signed)
Patient was trying to open a window on Wednesday and heard a "pop" in her back.  She has had increasing pain since then, has been using OTC meds with no relief.

## 2017-02-15 NOTE — ED Notes (Signed)
Pt requests more pain meds

## 2017-02-15 NOTE — ED Notes (Signed)
Pt did well ambulating in hall to fish bowl and back. Pt complained of pain however it was not as bad as before. Pt pain score is 7

## 2017-02-15 NOTE — ED Provider Notes (Signed)
[facility]-EMERGENCY DEPT Provider Note   CSN: [id number] Arrival date & time: 02/15/17  0550     History   Chief Complaint Chief Complaint  Patient presents with  . Back Pain    HPI Deanna Acosta is a 81 y.o. female.  The history is provided by the patient.  Back Pain   This is a new problem. The current episode started more than 2 days ago. The problem occurs constantly. The problem has been gradually worsening. The pain is associated with lifting heavy objects. The pain is present in the lumbar spine. The quality of the pain is described as stabbing. The pain radiates to the left thigh. The pain is severe. The symptoms are aggravated by certain positions, twisting and bending. The pain is the same all the time. Pertinent negatives include no chest pain, no fever, no numbness, no bowel incontinence, no perianal numbness, no bladder incontinence, no dysuria, no paresthesias, no paresis, no tingling and no weakness. She has tried analgesics for the symptoms. The treatment provided no relief.     81 year old female who presents with back pain. Started suddenly 4 days ago while she was trying to lift up a heavy window. Felt her back pop and since then have low back pain radiating towards the left groin. Worse with movement and walking. No numbness or weakness in the legs. Difficulty walking due to pain. No bowel incontinence, urinary retention or incontinence, no saddle anesthesia. No fever or chills. Is not anticoagulated and not immunosuppressed. Follows with Dr. 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 He sees her tomorrow in clinic.  Past Medical History:  Diagnosis Date  . Allergy    seasonal  . Anxiety   . Arthritis   . Asthma    bronchitis  . Blood clot in vein    LEG    YEARS AGO  . Blood dyscrasia    ELEVATED WBC  HX  . Bronchitis   . Cataract    removed bilaterally   . Clotting disorder (HCC)    clot in leg   . COPD (chronic obstructive pulmonary  disease) (HCC)   . Depression   . Flu    Oct 27, 2016 - admitted to [facility] for one day per pt  . GERD (gastroesophageal reflux disease)   . Hyperlipidemia   . Hypertension   . Osteoporosis   . Pneumonia    2013  . RSD (reflex sympathetic dystrophy)   . Shortness of breath   . Ulcer    H-pylorie treated    Patient Active Problem List   Diagnosis Date Noted  . Varicose veins of bilateral lower extremities with other complications 12/08/2016  . Influenza A 10/21/2016  . COPD with acute exacerbation (HCC) 09/26/2016  . Headache(784.0) 09/24/2012  . COPD exacerbation (HCC) 09/23/2012  . Hypoxemia 09/23/2012  . Vomiting 09/23/2012  . Dehydration 09/23/2012  . HTN (hypertension) 09/23/2012  . Chronic kidney disease, stage III (moderate) 09/23/2012    Past Surgical History:  Procedure Laterality Date  . CATARACT EXTRACTION W/PHACO Left 11/29/2013   Procedure: CATARACT EXTRACTION PHACO AND INTRAOCULAR LENS PLACEMENT (IOC);  Surgeon: Loraine LericheMark T. Nile RiggsShapiro, MD;  Location: [facility];  Service: Ophthalmology;  Laterality: Left;  CDE 21.93  . CATARACT EXTRACTION W/PHACO Right 12/13/2013   Procedure: CATARACT EXTRACTION PHACO AND INTRAOCULAR LENS PLACEMENT (IOC);  Surgeon: Loraine LericheMark T. Nile RiggsShapiro, MD;  Location: [facility];  Service: Ophthalmology;  Laterality: Right;  CDE:5.40  . CHOLECYSTECTOMY  2000  .  COLONOSCOPY    . ELBOW ARTHROSCOPY     tendonitis/right  . GANGLION CYST EXCISION     left wrist  . JOINT REPLACEMENT Left    knee- left   . KNEE ARTHROSCOPY     right  . KNEE LIGAMENT RECONSTRUCTION     right/water ski accident  . ORIF DISTAL RADIUS FRACTURE Right 2002   plate  . POLYPECTOMY    . ROTATOR CUFF REPAIR Right 2009  . TONSILLECTOMY AND ADENOIDECTOMY    . TOTAL ABDOMINAL HYSTERECTOMY  1980   fibroids  . TOTAL KNEE ARTHROPLASTY Left 06/20/2013   Procedure: LEFT TOTAL KNEE ARTHROPLASTY;  Surgeon: Verlee Rossetti, MD;  Location: [facility];  Service: Orthopedics;  Laterality: Left;  .  UPPER GASTROINTESTINAL ENDOSCOPY    . YAG LASER APPLICATION Left 12/04/2015   Procedure: YAG LASER APPLICATION;  Surgeon: Jethro Bolus, MD;  Location: [facility];  Service: Ophthalmology;  Laterality: Left;    OB History    No data available       Home Medications    Prior to Admission medications   Medication Sig Start Date End Date Taking? Authorizing Provider  acetaminophen (TYLENOL) 650 MG CR tablet Take 1,300 mg by mouth every 8 (eight) hours as needed for pain.   Yes [provider]  Cholecalciferol 1000 units capsule Take 1,000 Units by mouth daily.   Yes [provider]  omeprazole (PRILOSEC) 40 MG capsule Take 40 mg by mouth daily.    Yes [provider]  albuterol (PROVENTIL HFA;VENTOLIN HFA) 108 (90 Base) MCG/ACT inhaler Inhale 2 puffs into the lungs every 6 (six) hours as needed for wheezing or shortness of breath.     [provider]  benzonatate (TESSALON) 100 MG capsule Take 1 capsule (100 mg total) by mouth every 8 (eight) hours. Patient not taking: Reported on 02/15/2017 10/20/16   Raeford Razor, MD  dextromethorphan (DELSYM) 30 MG/5ML liquid Take 2.5 mLs (15 mg total) by mouth 2 (two) times daily as needed for cough. Patient not taking: Reported on 11/17/2016 10/21/16   Philip Aspen, Limmie Patricia, MD  diltiazem (CARDIZEM CD) 120 MG 24 hr capsule Take 120 mg by mouth daily.  08/01/14   [provider]  fexofenadine (ALLEGRA) 180 MG tablet Take 180 mg by mouth daily as needed.     [provider]  FLUoxetine (PROZAC) 40 MG capsule Take 40 mg by mouth daily before breakfast.     [provider]  fluticasone (FLONASE) 50 MCG/ACT nasal spray Place 1 spray into both nostrils daily as needed for allergies.  06/13/14   [provider]  Fluticasone-Salmeterol (ADVAIR) 250-50 MCG/DOSE AEPB Inhale 1 puff into the lungs every 12 (twelve) hours.    [provider]  magnesium citrate SOLN Take 296 mLs (1 Bottle  total) by mouth once as needed for severe constipation. 02/15/17   Lavera Guise, MD  polyethylene glycol powder (GLYCOLAX/MIRALAX) powder Take 17 g by mouth daily. 02/15/17   Lavera Guise, MD  potassium chloride SA (K-DUR,KLOR-CON) 20 MEQ tablet Take 20 mEq by mouth daily.    [provider]  senna-docusate (SENOKOT-S) 8.6-50 MG tablet Take 1 tablet by mouth 2 (two) times daily. 02/15/17   Lavera Guise, MD  SPIRIVA HANDIHALER 18 MCG inhalation capsule Place 18 mcg into inhaler and inhale daily.  09/28/15   [provider]    Family History Family History  Problem Relation Age of Onset  . Ulcers Father  bleeding/gastric  . Cancer Father   . Breast cancer Maternal Aunt 64  . Colon cancer Neg Hx   . Esophageal cancer Neg Hx   . Stomach cancer Neg Hx   . Rectal cancer Neg Hx     Social History Social History  Substance Use Topics  . Smoking status: Former Smoker    Packs/day: 2.00    Years: 54.00    Types: Cigarettes    Quit date: 02/25/1999  . Smokeless tobacco: Never Used     Comment: chews nicotine gum  . Alcohol use Yes     Comment: only on anniversary      Allergies   Cortisone; Penicillins; Other; and Levofloxacin   Review of Systems Review of Systems  Constitutional: Negative for fever.  Respiratory: Negative for shortness of breath.   Cardiovascular: Negative for chest pain.  Gastrointestinal: Negative for bowel incontinence.  Genitourinary: Negative for bladder incontinence and dysuria.  Musculoskeletal: Positive for back pain.  Neurological: Negative for tingling, weakness, numbness and paresthesias.  All other systems reviewed and are negative.    Physical Exam Updated Vital Signs BP (!) 147/68   Pulse 70   Temp 97.6 F (36.4 C) (Oral)   Resp 17   Ht 5\' 4"  (1.626 m)   Wt 182 lb (82.6 kg)   SpO2 94%   BMI 31.24 kg/m   Physical Exam Physical Exam  Nursing note and vitals reviewed. Constitutional: Non-toxic, and in no  acute distress Head: Normocephalic and atraumatic.  Mouth/Throat: Oropharynx is clear and moist.  Neck: Normal range of motion. Neck supple.  Cardiovascular: Normal rate and regular rhythm.   Pulmonary/Chest: Effort normal and breath sounds normal.  Abdominal: Soft. There is no tenderness. There is no rebound and no guarding.  Musculoskeletal: Normal range of motion of back and bilateral upper and lower extremities. There is midline tenderness in the upper lumbar spine.  Neurological: Alert, no facial droop, fluent speech, moves all extremities symmetrically, +2 patellar and achilles reflexes, there is full strength in hip flexion/extension, knee flexion/extension, and ankle dorsi/plantar flexion, sensation to light touch in tact throughout in bilateral lower extremities, normal rectal tone, no saddle anesthesia Skin: Skin is warm and dry.  Psychiatric: Cooperative   ED Treatments / Results  Labs (all labs ordered are listed, but only abnormal results are displayed) Labs Reviewed - No data to display  EKG  EKG Interpretation None       Radiology Dg Thoracic Spine W/swimmers  Result Date: 02/15/2017 CLINICAL DATA:  81 y/o  F; 4 days of mid to lower back pain. EXAM: THORACIC SPINE - 3 VIEWS COMPARISON:  12/26/2016 thoracic spine MRI. FINDINGS: There is no evidence of thoracic spine fracture. Alignment is normal. No other significant bone abnormalities are identified. IMPRESSION: Negative. Electronically Signed   By: Mitzi Hansen M.D.   On: 02/15/2017 06:46   Dg Lumbar Spine Complete  Result Date: 02/15/2017 CLINICAL DATA:  81 y/o  F; mid to lower back pain for 4 days. EXAM: LUMBAR SPINE - COMPLETE 4+ VIEW COMPARISON:  01/26/2017 lumbar spine radiograph FINDINGS: Mild loss of height of the T12 vertebral body new from prior radiographs. Moderate lumbar spondylosis with disc space narrowing greatest at L3-4 and L4-5. Calcific aortic atherosclerosis. Normal lumbar lordosis and  mild lumbar levocurvature with apex at L3. IMPRESSION: Mild compression deformity of the T12 vertebral body is new from 01/26/2017. Stable lumbar spine degenerative changes. Electronically Signed   By: Mitzi Hansen M.D.   On: 02/15/2017  06:50    Procedures Procedures (including critical care time)  Medications Ordered in ED Medications  oxyCODONE-acetaminophen (PERCOCET/ROXICET) 5-325 MG per tablet 1 tablet (1 tablet Oral Given 02/15/17 0737)  oxyCODONE-acetaminophen (PERCOCET/ROXICET) 5-325 MG per tablet 1 tablet (1 tablet Oral Given 02/15/17 4098)     Initial Impression / Assessment and Plan / ED Course  I have reviewed the triage vital signs and the nursing notes.  Pertinent labs & imaging results that were available during my care of the patient were reviewed by me and considered in my medical decision making (see chart for details).     Presenting with low back pain after trying to open a window several days ago. She is nontoxic in no acute distress. There is tenderness along the midline of the upper lumbar spine. X-rays visualized and didn't show a new T12 compression deformity, likely causing her symptoms. She is neurologically intact. She has been able to ambulate with pain control. There is no concerning features of her history or exam at this time and that it would suggest spinal cord compression. She has follow-up tomorrow morning with Dr. Yetta Barre who is her neurosurgeon. I will discharge home at this time with pain control and bowel regimen. Strict return instructions are reviewed.  Final Clinical Impressions(s) / ED Diagnoses   Final diagnoses:  Pain  T12 compression fracture (HCC)    New Prescriptions New Prescriptions   MAGNESIUM CITRATE SOLN    Take 296 mLs (1 Bottle total) by mouth once as needed for severe constipation.   POLYETHYLENE GLYCOL POWDER (GLYCOLAX/MIRALAX) POWDER    Take 17 g by mouth daily.   SENNA-DOCUSATE (SENOKOT-S) 8.6-50 MG TABLET    Take 1  tablet by mouth 2 (two) times daily.     Lavera Guise, MD 02/15/17 [phone]

## 2017-02-15 NOTE — ED Notes (Signed)
Pt did not need anything at this time  

## 2017-02-15 NOTE — Discharge Instructions (Signed)
You have a fracture of the T12 vertebrae. Please see Dr. Yetta BarreJones tomorrow as scheduled.  Please take medications as prescribed. While you are on your pain medications, you NEED to be taking stool softeners. MiraLAX or polyethylene glycol has no feeling. You can start off taking once a day but if you're not having adequate bowel movement, please increase to 2 times a day, then 3 times a day, then 4 times a day, and so forth.  Please return without fail for worsening symptoms, including intractable vomiting, escalating pain, inability to walk, or any other symptoms concerning to you

## 2017-02-16 ENCOUNTER — Other Ambulatory Visit: Payer: Self-pay | Admitting: Neurological Surgery

## 2017-02-16 DIAGNOSIS — R5381 Other malaise: Secondary | ICD-10-CM

## 2017-02-18 ENCOUNTER — Other Ambulatory Visit: Payer: Self-pay | Admitting: Neurological Surgery

## 2017-02-18 DIAGNOSIS — M8580 Other specified disorders of bone density and structure, unspecified site: Secondary | ICD-10-CM

## 2017-02-20 ENCOUNTER — Ambulatory Visit
Admission: RE | Admit: 2017-02-20 | Discharge: 2017-02-20 | Disposition: A | Discharge status: Home / Self Care | Payer: Medicare Other | Source: Ambulatory Visit | Attending: Neurological Surgery | Admitting: Neurological Surgery

## 2017-02-20 DIAGNOSIS — M8580 Other specified disorders of bone density and structure, unspecified site: Secondary | ICD-10-CM

## 2017-03-11 ENCOUNTER — Encounter: Payer: Self-pay | Admitting: Vascular Surgery

## 2017-03-16 ENCOUNTER — Ambulatory Visit (HOSPITAL_COMMUNITY)
Admission: RE | Admit: 2017-03-16 | Discharge: 2017-03-16 | Disposition: A | Discharge status: Home / Self Care | Payer: Medicare Other | Source: Ambulatory Visit | Attending: Vascular Surgery | Admitting: Vascular Surgery

## 2017-03-16 ENCOUNTER — Encounter: Payer: Self-pay | Admitting: Vascular Surgery

## 2017-03-16 ENCOUNTER — Ambulatory Visit (INDEPENDENT_AMBULATORY_CARE_PROVIDER_SITE_OTHER): Payer: Medicare Other | Admitting: Vascular Surgery

## 2017-03-16 VITALS — BP 136/63 | HR 73 | Temp 97.0°F | Resp 18 | Ht 64.0 in | Wt 176.0 lb

## 2017-03-16 DIAGNOSIS — I83893 Varicose veins of bilateral lower extremities with other complications: Secondary | ICD-10-CM

## 2017-03-16 DIAGNOSIS — I872 Venous insufficiency (chronic) (peripheral): Secondary | ICD-10-CM | POA: Insufficient documentation

## 2017-03-16 NOTE — Progress Notes (Signed)
Subjective:     Patient ID: Deanna Acosta, female   DOB: 04/19/1936, 81 y.o.   MRN: [id number]  HPI This 81 year old female returns for 3 month follow-up regarding her bilateral painful varicosities. She has a history of edema in both legs left worse than right. She does have a remote history of thrombophlebitis and is also had an active ulceration in the left leg distally which is been resistant to healing. She was found to have large caliber bilateral great saphenous veins with reflux on my bedside exam last visit. She continues to have aching and throbbing discomfort in both legs which worsens as the day progresses and is also developed a lot of itching and a dark bluish discoloration of both feet. Her symptoms have not been improved by long leg elastic compression stockings 20-30 millimeter gradient nor elevation and ibuprofen and this is affecting her daily living and she requests treatment.  Past Medical History:  Diagnosis Date  . Allergy    seasonal  . Anxiety   . Arthritis   . Asthma    bronchitis  . Blood clot in vein    LEG    YEARS AGO  . Blood dyscrasia    ELEVATED WBC  HX  . Bronchitis   . Cataract    removed bilaterally   . Clotting disorder (HCC)    clot in leg   . COPD (chronic obstructive pulmonary disease) (HCC)   . Depression   . Flu    Oct 27, 2016 - admitted to [facility] for one day per pt  . GERD (gastroesophageal reflux disease)   . Hyperlipidemia   . Hypertension   . Osteoporosis   . Pneumonia    2013  . RSD (reflex sympathetic dystrophy)   . Shortness of breath   . Ulcer    H-pylorie treated    Social History  Substance Use Topics  . Smoking status: Former Smoker    Packs/day: 2.00    Years: 54.00    Types: Cigarettes    Quit date: 02/25/1999  . Smokeless tobacco: Never Used     Comment: chews nicotine gum  . Alcohol use Yes     Comment: only on anniversary     Family History  Problem Relation Age of Onset  . Ulcers Father    bleeding/gastric  . Cancer Father   . Breast cancer Maternal Aunt 64  . Colon cancer Neg Hx   . Esophageal cancer Neg Hx   . Stomach cancer Neg Hx   . Rectal cancer Neg Hx     Allergies  Allergen Reactions  . Cortisone Other (See Comments)    headaches  . Penicillins Hives and Other (See Comments)    Has patient had a PCN reaction causing immediate rash, facial/tongue/throat swelling, SOB or lightheadedness with hypotension: Yes Has patient had a PCN reaction causing severe rash involving mucus membranes or skin necrosis: No Has patient had a PCN reaction that required hospitalization No Has patient had a PCN reaction occurring within the last 10 years: No If all of the above answers are "NO", then may proceed with Cephalosporin use. 2  . Other Other (See Comments)    Fluid pills give pt cramps  . Levofloxacin Rash     Current Outpatient Prescriptions:  .  albuterol (PROVENTIL HFA;VENTOLIN HFA) 108 (90 Base) MCG/ACT inhaler, Inhale 2 puffs into the lungs every 6 (six) hours as needed for wheezing or shortness of breath. , Disp: , Rfl:  .  Cholecalciferol 1000 units capsule, Take 1,000 Units by mouth daily., Disp: , Rfl:  .  diltiazem (CARDIZEM CD) 120 MG 24 hr capsule, Take 120 mg by mouth daily. , Disp: , Rfl: 1 .  FLUoxetine (PROZAC) 40 MG capsule, Take 40 mg by mouth daily before breakfast. , Disp: , Rfl:  .  fluticasone (FLONASE) 50 MCG/ACT nasal spray, Place 1 spray into both nostrils daily as needed for allergies. , Disp: , Rfl: 0 .  Fluticasone-Salmeterol (ADVAIR) 250-50 MCG/DOSE AEPB, Inhale 1 puff into the lungs every 12 (twelve) hours., Disp: , Rfl:  .  omeprazole (PRILOSEC) 40 MG capsule, Take 40 mg by mouth daily. , Disp: , Rfl:  .  potassium chloride SA (K-DUR,KLOR-CON) 20 MEQ tablet, Take 20 mEq by mouth daily., Disp: , Rfl:  .  SPIRIVA HANDIHALER 18 MCG inhalation capsule, Place 18 mcg into inhaler and inhale daily. , Disp: , Rfl: 0 .  acetaminophen (TYLENOL) 650 MG  CR tablet, Take 1,300 mg by mouth every 8 (eight) hours as needed for pain., Disp: , Rfl:  .  benzonatate (TESSALON) 100 MG capsule, Take 1 capsule (100 mg total) by mouth every 8 (eight) hours. (Patient not taking: Reported on 02/15/2017), Disp: 21 capsule, Rfl: 0 .  dextromethorphan (DELSYM) 30 MG/5ML liquid, Take 2.5 mLs (15 mg total) by mouth 2 (two) times daily as needed for cough. (Patient not taking: Reported on 11/17/2016), Disp: 89 mL, Rfl: 0 .  fexofenadine (ALLEGRA) 180 MG tablet, Take 180 mg by mouth daily as needed. , Disp: , Rfl:  .  magnesium citrate SOLN, Take 296 mLs (1 Bottle total) by mouth once as needed for severe constipation. (Patient not taking: Reported on 03/16/2017), Disp: 195 mL, Rfl: 0 .  oxyCODONE-acetaminophen (PERCOCET/ROXICET) 5-325 MG tablet, Take 1-2 tablets by mouth every 4 (four) hours as needed for severe pain. (Patient not taking: Reported on 03/16/2017), Disp: 10 tablet, Rfl: 0 .  polyethylene glycol powder (GLYCOLAX/MIRALAX) powder, Take 17 g by mouth daily. (Patient not taking: Reported on 03/16/2017), Disp: 500 g, Rfl: 0 .  senna-docusate (SENOKOT-S) 8.6-50 MG tablet, Take 1 tablet by mouth 2 (two) times daily. (Patient not taking: Reported on 03/16/2017), Disp: 60 tablet, Rfl: 0  Current Facility-Administered Medications:  .  0.9 %  sodium chloride infusion, 500 mL, Intravenous, Continuous, Hilarie Fredrickson, MD  Vitals:   03/16/17 1034  BP: 136/63  Pulse: 73  Resp: 18  Temp: 97 F (36.1 C)  SpO2: 93%  Weight: 176 lb (79.8 kg)  Height: 5\' 4"  (1.626 m)    Body mass index is 30.21 kg/m.         Review of Systems Denies chest pain, dyspnea on exertion, PND, orthopnea, hemoptysis    Objective:   Physical Exam BP 136/63 (BP Location: Left Arm, Patient Position: Sitting, Cuff Size: Normal)   Pulse 73   Temp 97 F (36.1 C)   Resp 18   Ht 5\' 4"  (1.626 m)   Wt 176 lb (79.8 kg)   SpO2 93%   BMI 30.21 kg/m   Gen. well-developed well-nourished  female no apparent distress alert and oriented 3 Lungs no rhonchi or wheezing Right leg with extensive bulging varicosities beginning in the distal thigh extending into the medial calf with extensive network of reticular veins and spider veins in the medial aspect of the foot with no ulceration noted. Early hyperpigmentation lower third right leg. 2+ posterior tibial pulse palpable Left leg with bulging varicosities medial calf over great  saphenous system with extensive network of reticular and spider veins lower third end of the left medial malleolar area with no active ulcer. 2+ posterior tibial pulse palpable.  Today ordered a venous duplex exam  of both legs which I reviewed and interpreted. She does have large caliber great saphenous veins bilaterally with gross reflux throughout supplying these painful varicosities       Assessment:     #1 painful varicosities bilateral lower extremities with bilateral edema and severe venous congestion lower third both legs with skin changes causing symptoms which are affecting patient's daily living and resistant to conservative measures including long-leg elastic compression stockings 20-30 millimeter gradient, elevation, and ibuprofen CEAP3    Plan:     Patient needs #1 laser ablation right great saphenous vein followed by #2 laser ablation left great saphenous vein. She will then need a three-month waiting period to be evaluated for multiple stab phlebectomy of residual varicosities and/or sclerotherapy We will proceed with precertification to perform this in the near future and relieve her symptoms

## 2017-03-26 ENCOUNTER — Other Ambulatory Visit: Payer: Self-pay | Admitting: *Deleted

## 2017-03-26 ENCOUNTER — Encounter: Payer: Self-pay | Admitting: Vascular Surgery

## 2017-03-26 DIAGNOSIS — I83893 Varicose veins of bilateral lower extremities with other complications: Secondary | ICD-10-CM

## 2017-03-30 ENCOUNTER — Encounter: Payer: Self-pay | Admitting: Vascular Surgery

## 2017-03-30 ENCOUNTER — Other Ambulatory Visit: Payer: Self-pay | Admitting: *Deleted

## 2017-03-30 ENCOUNTER — Ambulatory Visit (INDEPENDENT_AMBULATORY_CARE_PROVIDER_SITE_OTHER): Payer: Medicare Other | Admitting: Vascular Surgery

## 2017-03-30 VITALS — BP 140/80 | HR 80 | Temp 97.0°F | Resp 18 | Ht 64.0 in | Wt 180.6 lb

## 2017-03-30 DIAGNOSIS — I83891 Varicose veins of right lower extremities with other complications: Secondary | ICD-10-CM

## 2017-03-30 DIAGNOSIS — I83893 Varicose veins of bilateral lower extremities with other complications: Secondary | ICD-10-CM

## 2017-03-30 HISTORY — PX: ENDOVENOUS ABLATION SAPHENOUS VEIN W/ LASER: SUR449

## 2017-03-30 NOTE — Progress Notes (Signed)
Laser Ablation Procedure    Date: 03/30/2017   Loralie ChampagneGeraldine G Doren DOB:28-Jun-1936  Consent signed: Yes    Surgeon:  Dr. Quita SkyeJames D. Hart RochesterLawson  Procedure: Laser Ablation: right Greater Saphenous Vein  BP 140/80 (BP Location: Left Arm, Patient Position: Sitting, Cuff Size: Normal)   Pulse 80   Temp 97 F (36.1 C) (Oral)   Resp 18   Ht 5\' 4"  (1.626 m)   Wt 180 lb 9.6 oz (81.9 kg)   SpO2 96%   BMI 31.00 kg/m   Tumescent Anesthesia: 400 cc 0.9% NaCl with 50 cc Lidocaine HCL with 1% Epi and 15 cc 8.4% NaHCO3  Local Anesthesia: 2 cc Lidocaine HCL and NaHCO3 (ratio 2:1)  Pulsed Mode: 15 watts, 500ms delay, 1.0 duration  Total Energy:2604 Joules              Total Pulses: 174               Total Time: 2:54      Patient tolerated procedure well    Description of Procedure:  After marking the course of the secondary varicosities, the patient was placed on the operating table in the supine position, and the right leg was prepped and draped in sterile fashion.   Local anesthetic was administered and under ultrasound guidance the saphenous vein was accessed with a micro needle and guide wire; then the mirco puncture sheath was placed.  A guide wire was inserted saphenofemoral junction , followed by a 5 french sheath.  The position of the sheath and then the laser fiber below the junction was confirmed using the ultrasound.  Tumescent anesthesia was administered along the course of the saphenous vein using ultrasound guidance. The patient was placed in Trendelenburg position and protective laser glasses were placed on patient and staff, and the laser was fired at 15 watts continuous mode advancing 1-372mm/second for a total of 2604 joules.      Steri strip was applied to the IV insertion site and ABD pads and thigh high compression stockings were applied.  Ace wrap bandages were applied at the top of the saphenofemoral junction. Blood loss was less than 15 cc.  The patient ambulated out of the  operating room having tolerated the procedure well.

## 2017-03-30 NOTE — Progress Notes (Signed)
Subjective:     Patient ID: Deanna Acosta, female   DOB: 1936/02/02, 81 y.o.   MRN: [id number]  HPI This 81 year old female had laser ablation of the right great saphenous vein from the proximal calf to near the saphenofemoral junction performed under local tumescent anesthesia. A total of 2604 J of energy was utilized. She tolerated the procedure well.  Review of Systems     Objective:   Physical Exam BP 140/80 (BP Location: Left Arm, Patient Position: Sitting, Cuff Size: Normal)   Pulse 80   Temp 97 F (36.1 C) (Oral)   Resp 18   Ht 5\' 4"  (1.626 m)   Wt 180 lb 9.6 oz (81.9 kg)   SpO2 96%   BMI 31.00 kg/m       Assessment:     Well-tolerated laser ablation right great saphenous vein performed under local tumescent anesthesia    Plan:     Return in 1 week for venous duplex exam to confirm closure right great saphenous vein Patient will have similar procedure and contralateral left leg in the near future

## 2017-04-06 ENCOUNTER — Encounter: Payer: Self-pay | Admitting: Vascular Surgery

## 2017-04-07 ENCOUNTER — Ambulatory Visit (INDEPENDENT_AMBULATORY_CARE_PROVIDER_SITE_OTHER): Payer: Medicare Other | Admitting: Vascular Surgery

## 2017-04-07 ENCOUNTER — Ambulatory Visit (HOSPITAL_COMMUNITY)
Admission: RE | Admit: 2017-04-07 | Discharge: 2017-04-07 | Disposition: A | Discharge status: Home / Self Care | Payer: Medicare Other | Source: Ambulatory Visit | Attending: Vascular Surgery | Admitting: Vascular Surgery

## 2017-04-07 ENCOUNTER — Encounter: Payer: Self-pay | Admitting: Vascular Surgery

## 2017-04-07 VITALS — BP 163/87 | HR 69 | Temp 97.0°F | Resp 18 | Ht 64.0 in | Wt 179.4 lb

## 2017-04-07 DIAGNOSIS — I82811 Embolism and thrombosis of superficial veins of right lower extremities: Secondary | ICD-10-CM | POA: Diagnosis not present

## 2017-04-07 DIAGNOSIS — I83893 Varicose veins of bilateral lower extremities with other complications: Secondary | ICD-10-CM

## 2017-04-07 NOTE — Progress Notes (Signed)
Subjective:     Patient ID: Deanna Acosta, female   DOB: 02-10-1936, 81 y.o.   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 She has worn her long leg elastic compression stockings 20-30 millimeter gradient and taken ibuprofen as instructed. She has had some mild discomfort along the course of the great saphenous vein. She is noted no distal edema.  Past Medical History:  Diagnosis Date  . Allergy    seasonal  . Anxiety   . Arthritis   . Asthma    bronchitis  . Blood clot in vein    LEG    YEARS AGO  . Blood dyscrasia    ELEVATED WBC  HX  . Bronchitis   . Cataract    removed bilaterally   . Clotting disorder (HCC)    clot in leg   . COPD (chronic obstructive pulmonary disease) (HCC)   . Depression   . Flu    Oct 27, 2016 - [facility] for one day per pt  . GERD (gastroesophageal reflux disease)   . Hyperlipidemia   . Hypertension   . Osteoporosis   . Pneumonia    2013  . RSD (reflex sympathetic dystrophy)   . Shortness of breath   . Ulcer    H-pylorie treated    Social History  Substance Use Topics  . Smoking status: Former Smoker    Packs/day: 2.00    Years: 54.00    Types: Cigarettes    Quit date: 02/25/1999  . Smokeless tobacco: Never Used     Comment: chews nicotine gum  . Alcohol use Yes     Comment: only on anniversary     Family History  Problem Relation Age of Onset  . Ulcers Father        bleeding/gastric  . Cancer Father   . Breast cancer Maternal Aunt 64  . Colon cancer Neg Hx   . Esophageal cancer Neg Hx   . Stomach cancer Neg Hx   . Rectal cancer Neg Hx     Allergies  Allergen Reactions  . Cortisone Other (See Comments)    headaches  . Penicillins Hives and Other (See Comments)    Has patient had a PCN reaction causing immediate rash, facial/tongue/throat swelling, SOB or lightheadedness with hypotension: Yes Has  patient had a PCN reaction causing severe rash involving mucus membranes or skin necrosis: No Has patient had a PCN reaction that required hospitalization No Has patient had a PCN reaction occurring within the last 10 years: No If all of the above answers are "NO", then may proceed with Cephalosporin use. 2  . Other Other (See Comments)    Fluid pills give pt cramps  . Levofloxacin Rash     Current Outpatient Prescriptions:  .  acetaminophen (TYLENOL) 650 MG CR tablet, Take 1,300 mg by mouth every 8 (eight) hours as needed for pain., Disp: , Rfl:  .  albuterol (PROVENTIL HFA;VENTOLIN HFA) 108 (90 Base) MCG/ACT inhaler, Inhale 2 puffs into the lungs every 6 (six) hours as needed for wheezing or shortness of breath. , Disp: , Rfl:  .  Cholecalciferol 1000 units capsule, Take 1,000 Units by mouth daily., Disp: , Rfl:  .  diltiazem (CARDIZEM CD) 120 MG 24 hr capsule, Take 120 mg by mouth daily. , Disp: , Rfl: 1 .  fexofenadine (ALLEGRA) 180 MG tablet, Take 180 mg by mouth daily  as needed. , Disp: , Rfl:  .  FLUoxetine (PROZAC) 40 MG capsule, Take 40 mg by mouth daily before breakfast. , Disp: , Rfl:  .  fluticasone (FLONASE) 50 MCG/ACT nasal spray, Place 1 spray into both nostrils daily as needed for allergies. , Disp: , Rfl: 0 .  Fluticasone-Salmeterol (ADVAIR) 250-50 MCG/DOSE AEPB, Inhale 1 puff into the lungs every 12 (twelve) hours., Disp: , Rfl:  .  magnesium citrate SOLN, Take 296 mLs (1 Bottle total) by mouth once as needed for severe constipation., Disp: 195 mL, Rfl: 0 .  omeprazole (PRILOSEC) 40 MG capsule, Take 40 mg by mouth daily. , Disp: , Rfl:  .  oxyCODONE-acetaminophen (PERCOCET/ROXICET) 5-325 MG tablet, Take 1-2 tablets by mouth every 4 (four) hours as needed for severe pain., Disp: 10 tablet, Rfl: 0 .  polyethylene glycol powder (GLYCOLAX/MIRALAX) powder, Take 17 g by mouth daily., Disp: 500 g, Rfl: 0 .  potassium chloride SA (K-DUR,KLOR-CON) 20 MEQ tablet, Take 20 mEq by mouth  daily., Disp: , Rfl:  .  senna-docusate (SENOKOT-S) 8.6-50 MG tablet, Take 1 tablet by mouth 2 (two) times daily., Disp: 60 tablet, Rfl: 0 .  SPIRIVA HANDIHALER 18 MCG inhalation capsule, Place 18 mcg into inhaler and inhale daily. , Disp: , Rfl: 0 .  benzonatate (TESSALON) 100 MG capsule, Take 1 capsule (100 mg total) by mouth every 8 (eight) hours. (Patient not taking: Reported on 02/15/2017), Disp: 21 capsule, Rfl: 0 .  dextromethorphan (DELSYM) 30 MG/5ML liquid, Take 2.5 mLs (15 mg total) by mouth 2 (two) times daily as needed for cough. (Patient not taking: Reported on 11/17/2016), Disp: 89 mL, Rfl: 0  Current Facility-Administered Medications:  .  0.9 %  sodium chloride infusion, 500 mL, Intravenous, Continuous, Hilarie Fredrickson, MD  Vitals:   04/07/17 1358 04/07/17 1401  BP: (!) 176/81 (!) 163/87  Pulse: 69   Resp: 18   Temp: (!) 97 F (36.1 C)   TempSrc: Oral   SpO2: 96%   Weight: 179 lb 6.4 oz (81.4 kg)   Height: 5\' 4"  (1.626 m)     Body mass index is 30.79 kg/m.         Review of Systems Denies chest pain, orthopnea, hemoptysis. Does have history of asthma and GERD.    Objective:   Physical Exam BP (!) 163/87 (BP Location: Left Arm, Patient Position: Sitting, Cuff Size: Normal)   Pulse 69   Temp (!) 97 F (36.1 C) (Oral)   Resp 18   Ht 5\' 4"  (1.626 m)   Wt 179 lb 6.4 oz (81.4 kg)   SpO2 96%   BMI 30.79 kg/m   Gen. well-developed well-nourished female no apparent stress alert and oriented 3 Right leg with mild discomfort to palpation of great saphenous vein from knee to near the saphenofemoral junction. Mild irritation at 2 injection sites measuring about 2 mm in diameter and proximal thigh. No distal edema noted. Bulging varicosities below the knee are easily visible.  Today I ordered a venous duplex exam the right leg which I reviewed and interpreted. There is no DVT. There is total closure of the right great saphenous vein from the knee to near the  saphenofemoral junction     Assessment:     Successful laser ablation right great saphenous vein for painful varicosities with gross reflux    Plan:     Patient return next week for similar procedure left leg and then will return in 3 months for evaluation for  possible stab phlebectomy and/or sclerotherapy

## 2017-04-10 ENCOUNTER — Encounter: Payer: Self-pay | Admitting: Vascular Surgery

## 2017-04-13 ENCOUNTER — Encounter: Payer: Self-pay | Admitting: Vascular Surgery

## 2017-04-13 ENCOUNTER — Ambulatory Visit (INDEPENDENT_AMBULATORY_CARE_PROVIDER_SITE_OTHER): Payer: Medicare Other | Admitting: Vascular Surgery

## 2017-04-13 VITALS — BP 157/81 | HR 72 | Temp 97.5°F | Resp 16 | Ht 64.0 in | Wt 175.0 lb

## 2017-04-13 DIAGNOSIS — I83892 Varicose veins of left lower extremities with other complications: Secondary | ICD-10-CM | POA: Diagnosis not present

## 2017-04-13 HISTORY — PX: ENDOVENOUS ABLATION SAPHENOUS VEIN W/ LASER: SUR449

## 2017-04-13 NOTE — Progress Notes (Signed)
Subjective:     Patient ID: Deanna Acosta, female   DOB: 1936-08-14, 81 y.o.   MRN: [id number]  HPI This 81 year old female had laser ablation left great saphenous vein from the proximal calf to near the saphenofemoral junction performed under local tumescent anesthesia. A total of 2403 J of energy was utilized. She tolerated the procedure well.  Review of Systems     Objective:   Physical Exam BP (!) 157/81 (BP Location: Left Arm, Patient Position: Sitting, Cuff Size: Normal)   Pulse 72   Temp (!) 97.5 F (36.4 C) (Oral)   Resp 16   Ht 5\' 4"  (1.626 m)   Wt 175 lb (79.4 kg)   SpO2 98%   BMI 30.04 kg/m        Assessment:     Well-tolerated laser ablation left great saphenous vein performed under local tumescent anesthesia    Plan:     Return in 1 week for venous duplex exam to confirm closure left great saphenous vein Patient will then return in 3 months to be evaluated for possible stab phlebectomy bilateral residual varicosities

## 2017-04-13 NOTE — Progress Notes (Signed)
Laser Ablation Procedure    Date: 04/13/2017   Loralie ChampagneGeraldine G Kalina DOB:Jun 11, 1936  Consent signed: Yes    Surgeon:  Dr. Quita SkyeJames D. Hart RochesterLawson  Procedure: Laser Ablation: left Greater Saphenous Vein  BP (!) 157/81 (BP Location: Left Arm, Patient Position: Sitting, Cuff Size: Normal)   Pulse 72   Temp (!) 97.5 F (36.4 C) (Oral)   Resp 16   Ht 5\' 4"  (1.626 m)   Wt 175 lb (79.4 kg)   SpO2 98%   BMI 30.04 kg/m   Tumescent Anesthesia: 425 cc 0.9% NaCl with 50 cc Lidocaine HCL with 1% Epi and 15 cc 8.4% NaHCO3  Local Anesthesia: 4 cc Lidocaine HCL and NaHCO3 (ratio 2:1)  Pulsed Mode: 15 watts, 500ms delay, 1.0 duration  Total Energy: 2403 Joules             Total Pulses: 161               Total Time: 2:40     Patient tolerated procedure well    Description of Procedure:  After marking the course of the secondary varicosities, the patient was placed on the operating table in the supine position, and the left leg was prepped and draped in sterile fashion.   Local anesthetic was administered and under ultrasound guidance the saphenous vein was accessed with a micro needle and guide wire; then the mirco puncture sheath was placed.  A guide wire was inserted saphenofemoral junction , followed by a 5 french sheath.  The position of the sheath and then the laser fiber below the junction was confirmed using the ultrasound.  Tumescent anesthesia was administered along the course of the saphenous vein using ultrasound guidance. The patient was placed in Trendelenburg position and protective laser glasses were placed on patient and staff, and the laser was fired at 15 watts continuous mode advancing 1-342mm/second for a total of 2403 joules.     Steri strip was applied to IV insertion site and ABD pads and thigh high compression stockings were applied.  Ace wrap bandages were applied over the left thigh and at the top of the saphenofemoral junction. Blood loss was less than 15 cc.  The patient ambulated  out of the operating room having tolerated the procedure well.

## 2017-04-20 ENCOUNTER — Ambulatory Visit (HOSPITAL_COMMUNITY)
Admission: RE | Admit: 2017-04-20 | Discharge: 2017-04-20 | Disposition: A | Discharge status: Home / Self Care | Payer: Medicare Other | Source: Ambulatory Visit | Attending: Vascular Surgery | Admitting: Vascular Surgery

## 2017-04-20 ENCOUNTER — Ambulatory Visit: Payer: Medicare Other | Admitting: Vascular Surgery

## 2017-04-20 DIAGNOSIS — I83893 Varicose veins of bilateral lower extremities with other complications: Secondary | ICD-10-CM | POA: Insufficient documentation

## 2017-04-20 DIAGNOSIS — I82812 Embolism and thrombosis of superficial veins of left lower extremities: Secondary | ICD-10-CM | POA: Diagnosis not present

## 2017-04-22 ENCOUNTER — Encounter: Payer: Self-pay | Admitting: Vascular Surgery

## 2017-04-22 ENCOUNTER — Ambulatory Visit (INDEPENDENT_AMBULATORY_CARE_PROVIDER_SITE_OTHER): Payer: Medicare Other | Admitting: Vascular Surgery

## 2017-04-22 VITALS — BP 185/77 | HR 79 | Temp 97.8°F | Resp 16 | Ht 64.0 in | Wt 179.0 lb

## 2017-04-22 DIAGNOSIS — I83893 Varicose veins of bilateral lower extremities with other complications: Secondary | ICD-10-CM

## 2017-04-22 NOTE — Progress Notes (Signed)
Subjective:     Patient ID: Deanna Acosta, female   DOB: [birth year], 81 y.o.   MRN: [id number]  HPI This 81 year old female returns 1 week post-laser ablation left great saphenous vein for painful varicosities and swelling with gross reflux. She has worn her elastic compression stocking and taken ibuprofen as instructed. She has had some mild discomfort along the course of the great saphenous vein and no change in her distal edema. She has no complaints.  Past Medical History:  Diagnosis Date  . Allergy    seasonal  . Anxiety   . Arthritis   . Asthma    bronchitis  . Blood clot in vein    LEG    YEARS AGO  . Blood dyscrasia    ELEVATED WBC  HX  . Bronchitis   . Cataract    removed bilaterally   . Clotting disorder (HCC)    clot in leg   . COPD (chronic obstructive pulmonary disease) (HCC)   . Depression   . Flu    Oct 27, 2016 - [facility] one day per pt  . GERD (gastroesophageal reflux disease)   . Hyperlipidemia   . Hypertension   . Osteoporosis   . Pneumonia    2013  . RSD (reflex sympathetic dystrophy)   . Shortness of breath   . Ulcer    H-pylorie treated    Social History  Substance Use Topics  . Smoking status: Former Smoker    Packs/day: 2.00    Years: 54.00    Types: Cigarettes    Quit date: 02/25/1999  . Smokeless tobacco: Never Used     Comment: chews nicotine gum  . Alcohol use Yes     Comment: only on anniversary     Family History  Problem Relation Age of Onset  . Ulcers Father        bleeding/gastric  . Cancer Father   . Breast cancer Maternal Aunt 64  . Colon cancer Neg Hx   . Esophageal cancer Neg Hx   . Stomach cancer Neg Hx   . Rectal cancer Neg Hx     Allergies  Allergen Reactions  . Cortisone Other (See Comments)    headaches  . Penicillins Hives and Other (See Comments)    Has patient had a PCN reaction causing immediate rash, facial/tongue/throat swelling, SOB or lightheadedness with hypotension: Yes Has  patient had a PCN reaction causing severe rash involving mucus membranes or skin necrosis: No Has patient had a PCN reaction that required hospitalization No Has patient had a PCN reaction occurring within the last 10 years: No If all of the above answers are "NO", then may proceed with Cephalosporin use. 2  . Other Other (See Comments)    Fluid pills give pt cramps  . Levofloxacin Rash     Current Outpatient Prescriptions:  .  acetaminophen (TYLENOL) 650 MG CR tablet, Take 1,300 mg by mouth every 8 (eight) hours as needed for pain., Disp: , Rfl:  .  albuterol (PROVENTIL HFA;VENTOLIN HFA) 108 (90 Base) MCG/ACT inhaler, Inhale 2 puffs into the lungs every 6 (six) hours as needed for wheezing or shortness of breath. , Disp: , Rfl:  .  benzonatate (TESSALON) 100 MG capsule, Take 1 capsule (100 mg total) by mouth every 8 (eight) hours. (Patient not taking: Reported on 02/15/2017), Disp: 21 capsule, Rfl: 0 .  Cholecalciferol 1000 units capsule, Take 1,000 Units by mouth daily., Disp: , Rfl:  .  dextromethorphan (  DELSYM) 30 MG/5ML liquid, Take 2.5 mLs (15 mg total) by mouth 2 (two) times daily as needed for cough. (Patient not taking: Reported on 11/17/2016), Disp: 89 mL, Rfl: 0 .  diltiazem (CARDIZEM CD) 120 MG 24 hr capsule, Take 120 mg by mouth daily. , Disp: , Rfl: 1 .  fexofenadine (ALLEGRA) 180 MG tablet, Take 180 mg by mouth daily as needed. , Disp: , Rfl:  .  FLUoxetine (PROZAC) 40 MG capsule, Take 40 mg by mouth daily before breakfast. , Disp: , Rfl:  .  fluticasone (FLONASE) 50 MCG/ACT nasal spray, Place 1 spray into both nostrils daily as needed for allergies. , Disp: , Rfl: 0 .  Fluticasone-Salmeterol (ADVAIR) 250-50 MCG/DOSE AEPB, Inhale 1 puff into the lungs every 12 (twelve) hours., Disp: , Rfl:  .  magnesium citrate SOLN, Take 296 mLs (1 Bottle total) by mouth once as needed for severe constipation., Disp: 195 mL, Rfl: 0 .  omeprazole (PRILOSEC) 40 MG capsule, Take 40 mg by mouth  daily. , Disp: , Rfl:  .  oxyCODONE-acetaminophen (PERCOCET/ROXICET) 5-325 MG tablet, Take 1-2 tablets by mouth every 4 (four) hours as needed for severe pain., Disp: 10 tablet, Rfl: 0 .  polyethylene glycol powder (GLYCOLAX/MIRALAX) powder, Take 17 g by mouth daily., Disp: 500 g, Rfl: 0 .  potassium chloride SA (K-DUR,KLOR-CON) 20 MEQ tablet, Take 20 mEq by mouth daily., Disp: , Rfl:  .  senna-docusate (SENOKOT-S) 8.6-50 MG tablet, Take 1 tablet by mouth 2 (two) times daily., Disp: 60 tablet, Rfl: 0 .  SPIRIVA HANDIHALER 18 MCG inhalation capsule, Place 18 mcg into inhaler and inhale daily. , Disp: , Rfl: 0  Current Facility-Administered Medications:  .  0.9 %  sodium chloride infusion, 500 mL, Intravenous, Continuous, Hilarie Fredrickson, MD  Vitals:   04/22/17 1037 04/22/17 1038  BP: (!) 181/77 (!) 185/77  Pulse: 79   Resp: 16   Temp: 97.8 F (36.6 C)   SpO2: 93%   Weight: 179 lb (81.2 kg)   Height: 5\' 4"  (1.626 m)     Body mass index is 30.73 kg/m.         Review of Systems Denies chest pain, mild dyspnea on exertion, PND, orthopnea, hemoptysis. Patient does have chronic COPD.    Objective:   Physical Exam BP (!) 185/77   Pulse 79   Temp 97.8 F (36.6 C)   Resp 16   Ht 5\' 4"  (1.626 m)   Wt 179 lb (81.2 kg)   SpO2 93%   BMI 30.73 kg/m   Gen. well-developed well-nourished female no apparent chest alert and oriented 3 Lungs no rhonchi or wheezing Cardiovascular regular rhythm no murmurs Left leg with mild discomfort to deep palpation over the great saphenous vein from the distal thigh to the proximal inguinal area. 2+ dorsalis pedis pulse palpable. Varicosities remain visible in the medial calf.  Today I ordered a venous duplex exam the left leg which I reviewed and interpreted. There is no DVT. 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

## 2017-06-16 ENCOUNTER — Emergency Department (HOSPITAL_COMMUNITY): Payer: Medicare Other

## 2017-06-16 ENCOUNTER — Emergency Department (HOSPITAL_COMMUNITY)
Admission: EM | Admit: 2017-06-16 | Discharge: 2017-06-16 | Disposition: A | Discharge status: Home / Self Care | Payer: Medicare Other | Attending: Emergency Medicine | Admitting: Emergency Medicine

## 2017-06-16 ENCOUNTER — Encounter (HOSPITAL_COMMUNITY): Payer: Self-pay | Admitting: Nurse Practitioner

## 2017-06-16 DIAGNOSIS — Z87891 Personal history of nicotine dependence: Secondary | ICD-10-CM | POA: Diagnosis not present

## 2017-06-16 DIAGNOSIS — Z9049 Acquired absence of other specified parts of digestive tract: Secondary | ICD-10-CM | POA: Diagnosis not present

## 2017-06-16 DIAGNOSIS — Z96652 Presence of left artificial knee joint: Secondary | ICD-10-CM | POA: Diagnosis not present

## 2017-06-16 DIAGNOSIS — I129 Hypertensive chronic kidney disease with stage 1 through stage 4 chronic kidney disease, or unspecified chronic kidney disease: Secondary | ICD-10-CM | POA: Insufficient documentation

## 2017-06-16 DIAGNOSIS — F329 Major depressive disorder, single episode, unspecified: Secondary | ICD-10-CM | POA: Insufficient documentation

## 2017-06-16 DIAGNOSIS — Y998 Other external cause status: Secondary | ICD-10-CM | POA: Insufficient documentation

## 2017-06-16 DIAGNOSIS — Z79899 Other long term (current) drug therapy: Secondary | ICD-10-CM | POA: Diagnosis not present

## 2017-06-16 DIAGNOSIS — J449 Chronic obstructive pulmonary disease, unspecified: Secondary | ICD-10-CM | POA: Diagnosis not present

## 2017-06-16 DIAGNOSIS — Y92008 Other place in unspecified non-institutional (private) residence as the place of occurrence of the external cause: Secondary | ICD-10-CM | POA: Diagnosis not present

## 2017-06-16 DIAGNOSIS — S91312A Laceration without foreign body, left foot, initial encounter: Secondary | ICD-10-CM

## 2017-06-16 DIAGNOSIS — F419 Anxiety disorder, unspecified: Secondary | ICD-10-CM | POA: Insufficient documentation

## 2017-06-16 DIAGNOSIS — Y9389 Activity, other specified: Secondary | ICD-10-CM | POA: Diagnosis not present

## 2017-06-16 DIAGNOSIS — J45909 Unspecified asthma, uncomplicated: Secondary | ICD-10-CM | POA: Insufficient documentation

## 2017-06-16 DIAGNOSIS — N183 Chronic kidney disease, stage 3 (moderate): Secondary | ICD-10-CM | POA: Insufficient documentation

## 2017-06-16 MED ORDER — SULFAMETHOXAZOLE-TRIMETHOPRIM 800-160 MG PO TABS: 1.0000 | ORAL_TABLET | Freq: Two times a day (BID) | ORAL | 0 refills | Status: AC

## 2017-06-16 MED ORDER — HYDROCODONE-ACETAMINOPHEN 5-325 MG PO TABS: 2.0000 | ORAL_TABLET | ORAL | 0 refills | Status: DC | PRN

## 2017-06-16 MED ORDER — LIDOCAINE-EPINEPHRINE-TETRACAINE (LET) SOLUTION
3.0000 mL | Freq: Once | NASAL | Status: AC
  Administered 2017-06-16: 3 mL via TOPICAL
  Filled 2017-06-16: qty 3

## 2017-06-16 MED ORDER — OXYCODONE-ACETAMINOPHEN 5-325 MG PO TABS
2.0000 | ORAL_TABLET | Freq: Once | ORAL | Status: AC
  Administered 2017-06-16: 2 via ORAL
  Filled 2017-06-16: qty 2

## 2017-06-16 MED ORDER — LIDOCAINE HCL 2 % IJ SOLN
20.0000 mL | Freq: Once | INTRAMUSCULAR | Status: AC
  Administered 2017-06-16: 400 mg
  Filled 2017-06-16: qty 20

## 2017-06-16 NOTE — ED Notes (Signed)
Pt to xray

## 2017-06-16 NOTE — ED Triage Notes (Signed)
Per EMS pt was driving her truck in her driveway and got out of truck and had forgotten to put truck in park and truck rolled over her left foot. 8 inch laceration noted to anterior foot, bleeding controlled, cap refill less than 3 seconds- good color maintained. Denies LOC,and pain anywhere else. Pt has small abrasion to left elbow as well.

## 2017-06-16 NOTE — Discharge Instructions (Signed)
Suture removal in 10 days.  Return if any problems.
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
## 2017-06-21 ENCOUNTER — Emergency Department (HOSPITAL_COMMUNITY)
Admission: EM | Admit: 2017-06-21 | Discharge: 2017-06-21 | Disposition: A | Discharge status: Home / Self Care | Payer: Medicare Other | Attending: Emergency Medicine | Admitting: Emergency Medicine

## 2017-06-21 ENCOUNTER — Emergency Department (HOSPITAL_COMMUNITY): Payer: Medicare Other

## 2017-06-21 ENCOUNTER — Encounter (HOSPITAL_COMMUNITY): Payer: Self-pay | Admitting: *Deleted

## 2017-06-21 DIAGNOSIS — I129 Hypertensive chronic kidney disease with stage 1 through stage 4 chronic kidney disease, or unspecified chronic kidney disease: Secondary | ICD-10-CM | POA: Diagnosis not present

## 2017-06-21 DIAGNOSIS — B370 Candidal stomatitis: Secondary | ICD-10-CM

## 2017-06-21 DIAGNOSIS — S2232XA Fracture of one rib, left side, initial encounter for closed fracture: Secondary | ICD-10-CM

## 2017-06-21 DIAGNOSIS — J45909 Unspecified asthma, uncomplicated: Secondary | ICD-10-CM | POA: Diagnosis not present

## 2017-06-21 DIAGNOSIS — Y9389 Activity, other specified: Secondary | ICD-10-CM | POA: Diagnosis not present

## 2017-06-21 DIAGNOSIS — Y999 Unspecified external cause status: Secondary | ICD-10-CM | POA: Diagnosis not present

## 2017-06-21 DIAGNOSIS — J449 Chronic obstructive pulmonary disease, unspecified: Secondary | ICD-10-CM | POA: Diagnosis not present

## 2017-06-21 DIAGNOSIS — Z87891 Personal history of nicotine dependence: Secondary | ICD-10-CM | POA: Insufficient documentation

## 2017-06-21 DIAGNOSIS — S92022D Displaced fracture of anterior process of left calcaneus, subsequent encounter for fracture with routine healing: Secondary | ICD-10-CM | POA: Insufficient documentation

## 2017-06-21 DIAGNOSIS — Z79899 Other long term (current) drug therapy: Secondary | ICD-10-CM | POA: Insufficient documentation

## 2017-06-21 DIAGNOSIS — Y929 Unspecified place or not applicable: Secondary | ICD-10-CM | POA: Diagnosis not present

## 2017-06-21 DIAGNOSIS — N183 Chronic kidney disease, stage 3 (moderate): Secondary | ICD-10-CM | POA: Diagnosis not present

## 2017-06-21 DIAGNOSIS — S358X9A Unspecified injury of other blood vessels at abdomen, lower back and pelvis level, initial encounter: Secondary | ICD-10-CM | POA: Diagnosis present

## 2017-06-21 MED ORDER — OXYCODONE-ACETAMINOPHEN 5-325 MG PO TABS
2.0000 | ORAL_TABLET | Freq: Once | ORAL | Status: AC
  Administered 2017-06-21: 2 via ORAL
  Filled 2017-06-21: qty 2

## 2017-06-21 MED ORDER — SENNOSIDES-DOCUSATE SODIUM 8.6-50 MG PO TABS: 1.0000 | ORAL_TABLET | Freq: Two times a day (BID) | ORAL | 0 refills | Status: DC

## 2017-06-21 MED ORDER — OXYCODONE-ACETAMINOPHEN 5-325 MG PO TABS: 1.0000 | ORAL_TABLET | Freq: Four times a day (QID) | ORAL | 0 refills | Status: DC | PRN

## 2017-06-21 MED ORDER — NYSTATIN 100000 UNIT/ML MT SUSP: 500000.0000 [IU] | Freq: Four times a day (QID) | OROMUCOSAL | 0 refills | Status: DC

## 2017-06-21 MED ORDER — POLYETHYLENE GLYCOL 3350 17 GM/SCOOP PO POWD: 17.0000 g | Freq: Every day | ORAL | 0 refills | Status: DC

## 2017-06-21 NOTE — ED Notes (Signed)
Patient transported to X-ray 

## 2017-06-21 NOTE — Progress Notes (Signed)
Orthopedic Tech Progress Note Patient [name] 08/08/1936 [id number]  Patient ID: Deanna Acosta, female   DOB: [birth year], 81 y.o.   MRN: [id number]   [name] 06/21/2017, 2:47 PM RN stated that pt has crutches at home

## 2017-06-21 NOTE — ED Notes (Signed)
Patient returned to room and placed back on monitor.  

## 2017-06-21 NOTE — Progress Notes (Signed)
Orthopedic Tech Progress Note Patient Details:  Deanna Acosta 08-Feb-1936 [id number]  Ortho Devices Type of Ortho Device: Ace wrap, Post (short leg) splint Ortho Device/Splint Location: lle Ortho Device/Splint Interventions: Application   Arshia Spellman 06/21/2017, 2:44 PM

## 2017-06-21 NOTE — Discharge Instructions (Signed)
No weight bearing on the left foot

## 2017-06-21 NOTE — ED Notes (Signed)
Patient verbalized understanding of discharge instructions and denies any further needs or questions at this time. VS stable. Patient ambulatory with steady gait. RN assisted patient to ED entrance and into vehicle.
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
## 2017-07-17 IMAGING — US US EXTREM LOW VENOUS BILAT
1 series · 13 of 24 positions shown · non-contrast
Comparison: None.

CLINICAL DATA: Chronic left lower extremity edema.



[Series 1: us extrem low venous bilat · 0.09mm/px · 13 of 32 slices shown]
[im 1/32]
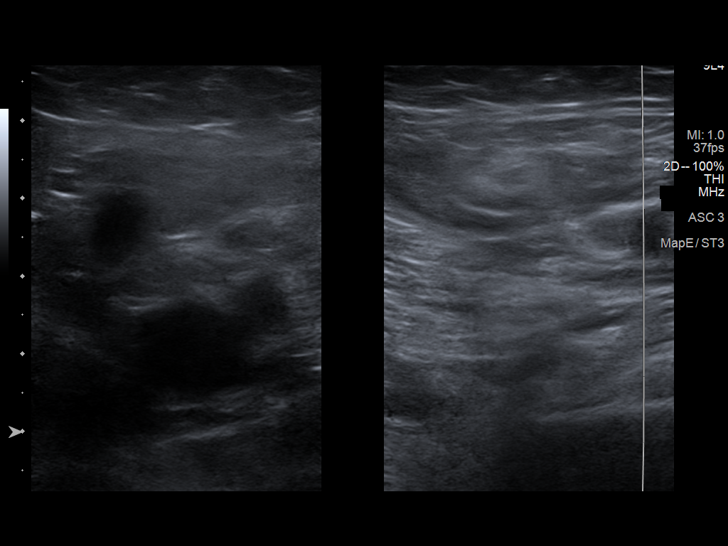
[im 3/32]
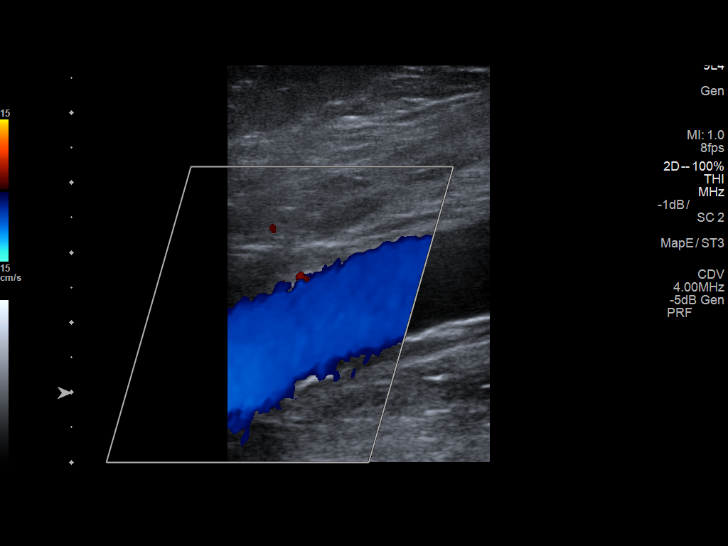
[im 6/32]
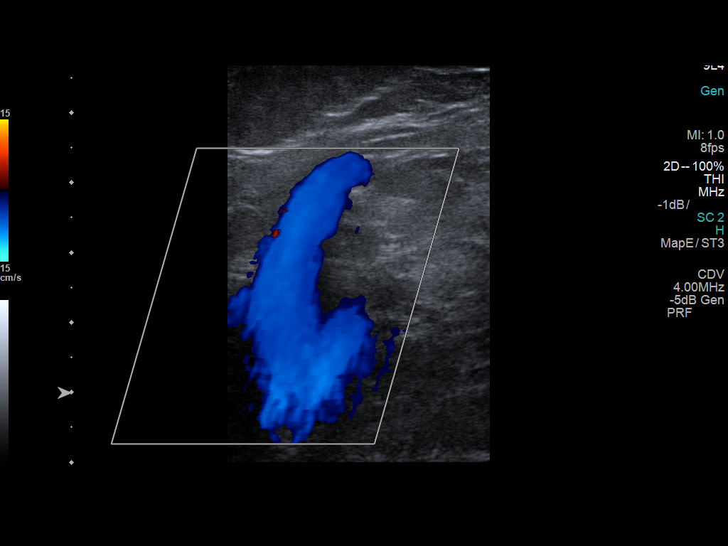
[im 9/32]
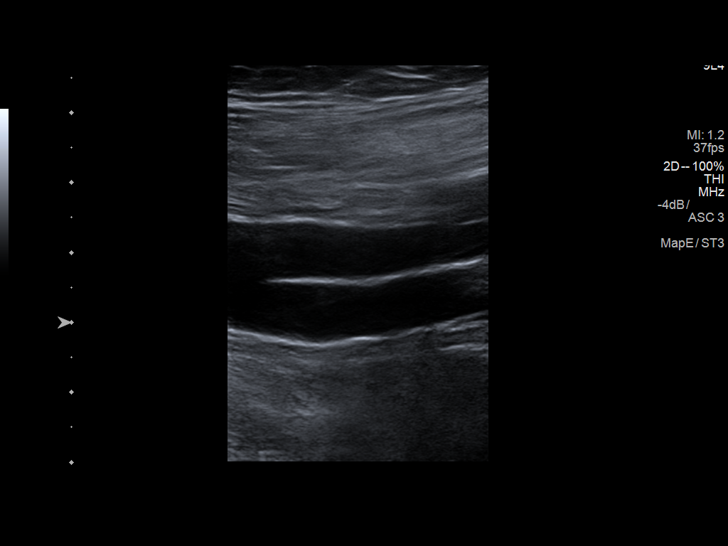
[im 11/32]
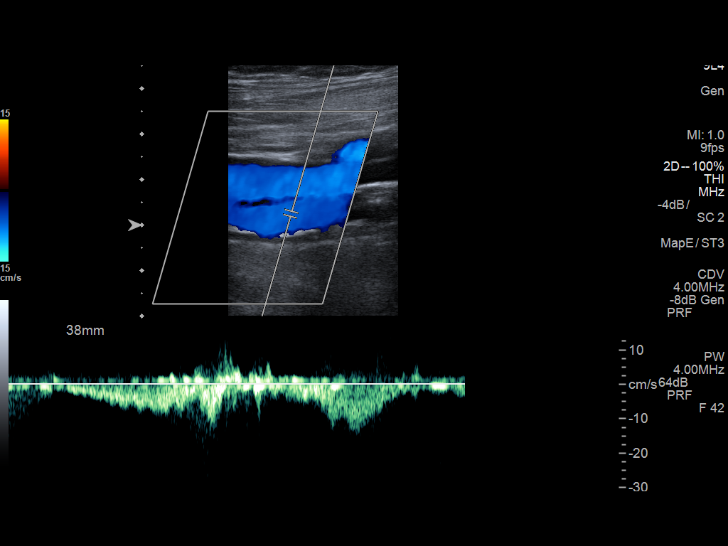
[im 14/32]
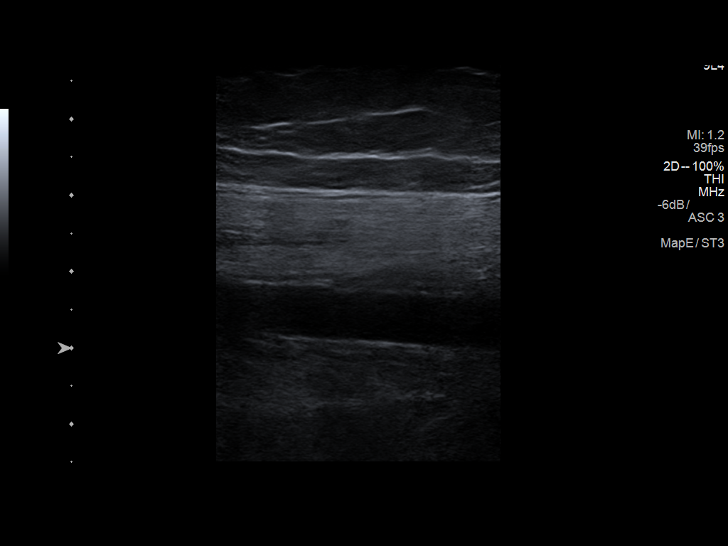
[im 17/32]
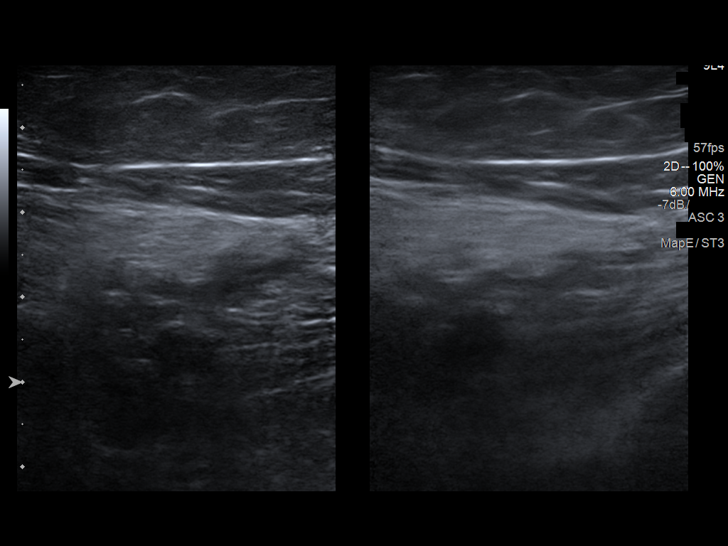
[im 18/32]
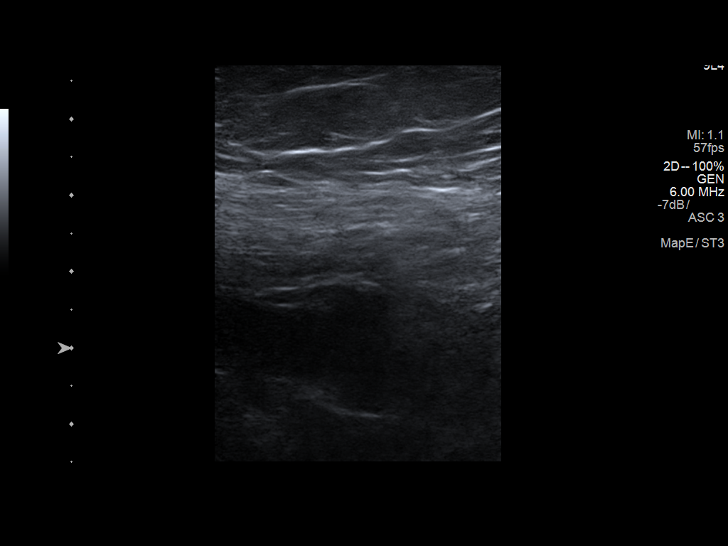
[im 21/32]
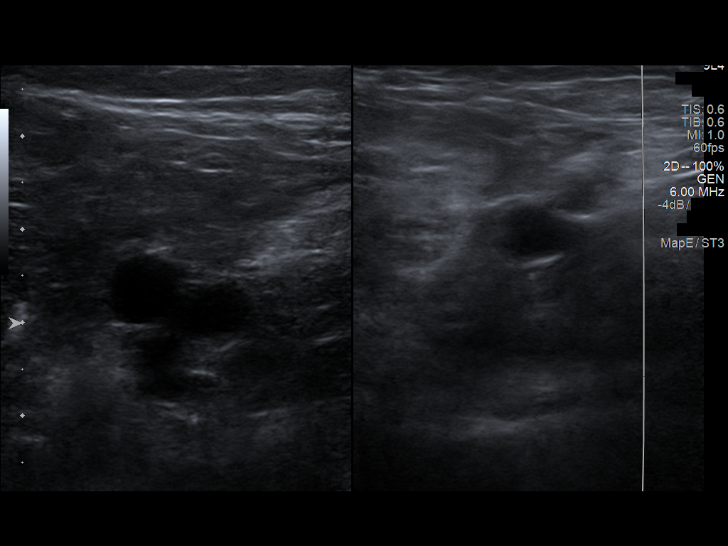
[im 23/32]
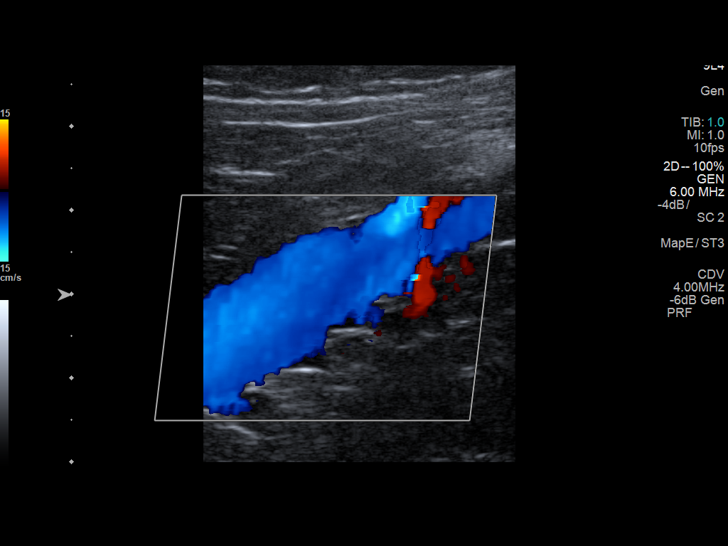
[im 26/32]
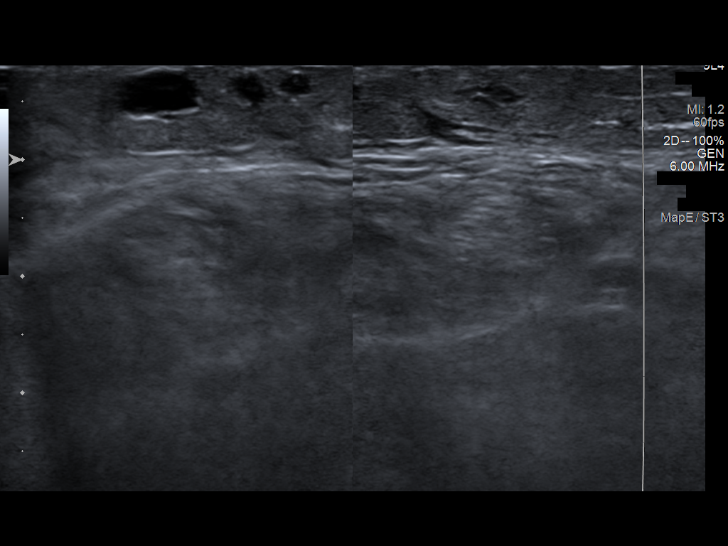
[im 29/32]
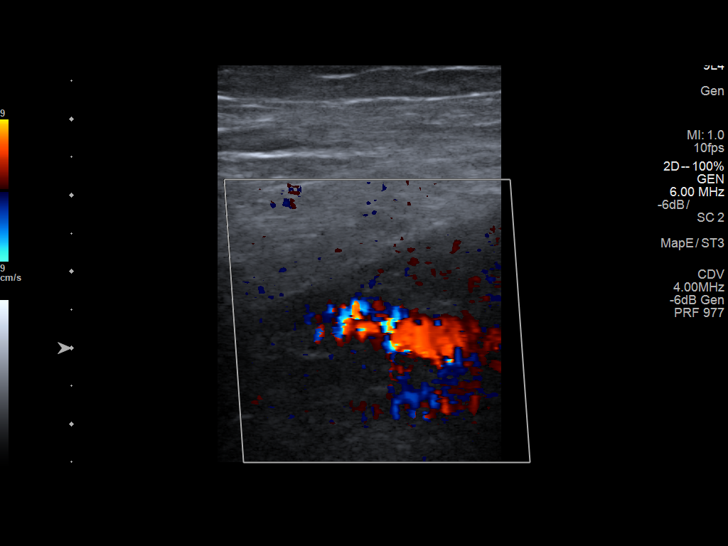
[im 32/32]
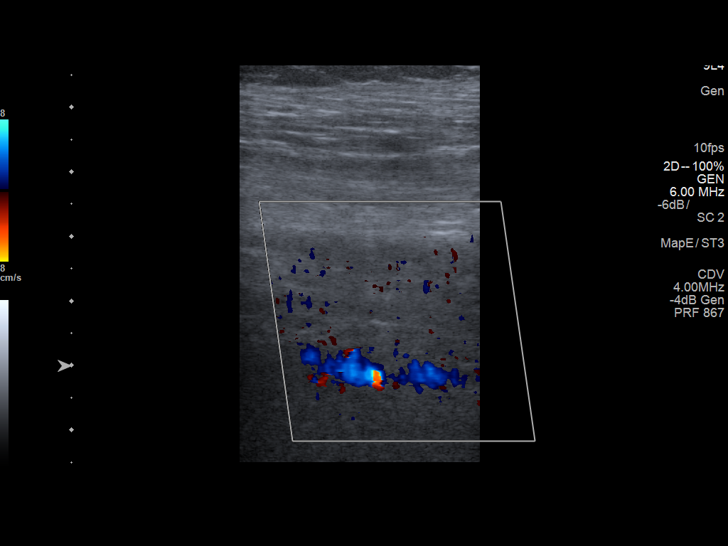

[13 of 24 positions shown; findings below may reference images not displayed]

FINDINGS: RIGHT LOWER EXTREMITY

Common Femoral Vein: No evidence of thrombus. Normal
compressibility, respiratory phasicity and response to augmentation.

Saphenofemoral Junction: No evidence of thrombus. Normal
compressibility and flow on color Doppler imaging.

Profunda Femoral Vein: No evidence of thrombus. Normal
compressibility and flow on color Doppler imaging.

Femoral Vein: No evidence of thrombus. Normal compressibility,
respiratory phasicity and response to augmentation.

Popliteal Vein: No evidence of thrombus. Normal compressibility,
respiratory phasicity and response to augmentation.

Calf Veins: Visualized right deep calf veins are patent without
thrombus.

Superficial Great Saphenous Vein: Visualized right great saphenous
vein is compressible without thrombus.

Other Findings: There is a complex fluid collection in the right
popliteal fossa compatible with a Baker's cyst. This measures 6.1 x
2.6 x 5.2 cm.

LEFT LOWER EXTREMITY

Common Femoral Vein: No evidence of thrombus. Normal
compressibility, respiratory phasicity and response to augmentation.

Saphenofemoral Junction: No evidence of thrombus. Normal
compressibility and flow on color Doppler imaging.

Profunda Femoral Vein: No evidence of thrombus. Normal
compressibility and flow on color Doppler imaging.

Femoral Vein: No evidence of thrombus. Normal compressibility,
respiratory phasicity and response to augmentation.

Popliteal Vein: No evidence of thrombus. Normal compressibility,
respiratory phasicity and response to augmentation.

Calf Veins: Visualized left deep calf veins are patent without
thrombus.

Superficial Great Saphenous Vein: Visualized left great saphenous
vein is compressible without thrombus.

Other Findings:  None.
IMPRESSION: No evidence of deep venous thrombosis.

Right Baker's cyst measuring up to 6.1 cm.

## 2017-07-27 ENCOUNTER — Ambulatory Visit: Payer: Medicare Other | Admitting: Vascular Surgery

## 2017-08-18 ENCOUNTER — Ambulatory Visit: Payer: Medicare Other | Admitting: Vascular Surgery

## 2017-09-01 ENCOUNTER — Ambulatory Visit: Payer: Medicare Other | Admitting: Vascular Surgery

## 2017-09-01 ENCOUNTER — Encounter: Payer: Self-pay | Admitting: Vascular Surgery

## 2017-09-01 VITALS — BP 132/78 | HR 84 | Temp 97.2°F | Resp 18 | Ht 64.5 in | Wt 178.5 lb

## 2017-09-01 DIAGNOSIS — I83893 Varicose veins of bilateral lower extremities with other complications: Secondary | ICD-10-CM
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
## 2017-10-01 ENCOUNTER — Telehealth: Payer: Self-pay | Admitting: *Deleted

## 2017-10-01 NOTE — Telephone Encounter (Signed)
Mrs. Deanna Acosta calling to inform of upcoming knee replacement surgery in February 2019 and deferring stab phlebectomy procedures.  Mrs. Deanna Acosta will call [facility] to schedule stab phlebectomy procedures after she has recovered from knee replacement surgery.
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
## 2017-11-30 NOTE — Pre-Procedure Instructions (Signed)
Deanna Acosta  11/30/2017      [facility] [address] [place] - [address] [place] [address] Phone: [phone] Fax: [phone]    Your procedure is scheduled on 12/11/2017.  Report to [facility] Admitting at 1030 A.M.  Call this number if you have problems the morning of surgery:  [phone]   Remember:  Do not eat food or drink liquids after midnight.   Continue all medications as directed by your physician except follow these medication instructions before surgery below   Take these medicines the morning of surgery with A SIP OF WATER: Albuterol (Proventil/Ventolin) inhaler - if needed for wheezing Diltiazem (Cardizem) Fluticasone (Flonase) nasal spray - if needed for allergies Fluticasone-salmeterol (Advair) Omeprazole (Prilosec) - if needed Tramadol (Ultram) - if needed for pain  7 days prior to surgery STOP taking any Aspirin (unless otherwise instructed by your surgeon), Aleve, Naproxen, Ibuprofen, Motrin, Advil, Goody's, BC's, all herbal medications, fish oil, and all vitamins.  Follow your doctors instructions regarding your Aspirin.  If no instructions were given by your doctor, then you will need to call the prescribing office office to get instructions.       Do not wear jewelry, make-up or nail polish.  Do not wear lotions, powders, or perfumes, or deodorant.  Do not shave 48 hours prior to surgery.    Do not bring valuables to the hospital.  [facility] is not responsible for any belongings or valuables.  Hearing aids, eyeglasses, contacts, dentures, partials, or bridgework may not be worn into surgery.  Leave your suitcase in the car.  After surgery it may be brought to your room.  For patients admitted to the hospital, discharge time will be determined by your treatment team.  Patients discharged the day of surgery will not be allowed to drive home.   Name and phone number of your  driver:   Special instructions:   - Preparing For Surgery  Before surgery, you can play an important role. Because skin is not sterile, your skin needs to be as free of germs as possible. You can reduce the number of germs on your skin by washing with CHG (chlorahexidine gluconate) Soap before surgery.  CHG is an antiseptic cleaner which kills germs and bonds with the skin to continue killing germs even after washing.  Please do not use if you have an allergy to CHG or antibacterial soaps. If your skin becomes reddened/irritated stop using the CHG.  Do not shave (including legs and underarms) for at least 48 hours prior to first CHG shower. It is OK to shave your face.  Please follow these instructions carefully.   1. Shower the NIGHT BEFORE SURGERY and the MORNING OF SURGERY with CHG.   2. If you chose to wash your hair, wash your hair first as usual with your normal shampoo.  3. After you shampoo, rinse your hair and body thoroughly to remove the shampoo.  4. Use CHG as you would any other liquid soap. You can apply CHG directly to the skin and wash gently with a scrungie or a clean washcloth.   5. Apply the CHG Soap to your body ONLY FROM THE NECK DOWN.  Do not use on open wounds or open sores. Avoid contact with your eyes, ears, mouth and genitals (private parts). Wash Face and genitals (private parts)  with your normal soap.  6. Wash thoroughly, paying special attention to the area where your  surgery will be performed.  7. Thoroughly rinse your body with warm water from the neck down.  8. DO NOT shower/wash with your normal soap after using and rinsing off the CHG Soap.  9. Pat yourself dry with a CLEAN TOWEL.  10. Wear CLEAN PAJAMAS to bed the night before surgery, wear comfortable clothes the morning of surgery  11. Place CLEAN SHEETS on your bed the night of your first shower and DO NOT SLEEP WITH PETS.    Day of Surgery: Shower as stated above. Do not apply  any deodorants/lotions. Please wear clean clothes to the hospital/surgery center.      Please read over the following fact sheets that you were given.

## 2017-12-01 ENCOUNTER — Encounter (HOSPITAL_COMMUNITY): Payer: Self-pay

## 2017-12-01 ENCOUNTER — Other Ambulatory Visit: Payer: Self-pay

## 2017-12-01 ENCOUNTER — Encounter (HOSPITAL_COMMUNITY)
Admission: RE | Admit: 2017-12-01 | Discharge: 2017-12-01 | Disposition: A | Discharge status: Home / Self Care | Payer: Medicare Other | Source: Ambulatory Visit | Attending: Orthopedic Surgery | Admitting: Orthopedic Surgery

## 2017-12-01 DIAGNOSIS — Z01812 Encounter for preprocedural laboratory examination: Secondary | ICD-10-CM | POA: Insufficient documentation

## 2017-12-01 HISTORY — DX: Other specified postprocedural states: Z98.890

## 2017-12-01 HISTORY — DX: Personal history of other diseases of the digestive system: Z87.19

## 2017-12-01 HISTORY — DX: Nausea with vomiting, unspecified: R11.2

## 2017-12-01 LAB — BASIC METABOLIC PANEL
Anion gap: 12 (ref 5–15)
BUN: 18 mg/dL (ref 6–20)
CO2: 23 mmol/L (ref 22–32)
Calcium: 9.6 mg/dL (ref 8.9–10.3)
Chloride: 103 mmol/L (ref 101–111)
Creatinine, Ser: 1.38 mg/dL — ABNORMAL HIGH (ref 0.44–1.00)
GFR calc Af Amer: 41 mL/min — ABNORMAL LOW (ref >60)
GFR calc non Af Amer: 35 mL/min — ABNORMAL LOW (ref >60)
Glucose, Bld: 103 mg/dL — ABNORMAL HIGH (ref 65–99)
Potassium: 4 mmol/L (ref 3.5–5.1)
Sodium: 138 mmol/L (ref 135–145)

## 2017-12-01 LAB — CBC
HCT: 38.9 % (ref 36.0–46.0)
Hemoglobin: 12.5 g/dL (ref 12.0–15.0)
MCH: 28.5 pg (ref 26.0–34.0)
MCHC: 32.1 g/dL (ref 30.0–36.0)
MCV: 88.6 fL (ref 78.0–100.0)
Platelets: 325 10*3/uL (ref 150–400)
RBC: 4.39 MIL/uL (ref 3.87–5.11)
RDW: 14.3 % (ref 11.5–15.5)
WBC: 9.4 10*3/uL (ref 4.0–10.5)

## 2017-12-01 LAB — SURGICAL PCR SCREEN
MRSA, PCR: NEGATIVE
Staphylococcus aureus: NEGATIVE

## 2017-12-01 NOTE — Progress Notes (Signed)
PCP - [facility]igmund [facility]Lisa Miller [facility] [address] - patient states she went in January for medical clearance, will request Cardiologist - patient denies  Chest x-ray - n/a EKG - 10/2017 - requesting tracing from Dr. Rondel BatonMiller's office Stress Test - patient denies ECHO - patient denies Cardiac Cath - patient denies  Sleep Study - patient denies  Aspirin Instructions:patient states she takes ASA for pain, instructed to stop 7 days prior to surgery.  Anesthesia review: n/a  Patient denies shortness of breath, fever, cough and chest pain at PAT appointment   Patient verbalized understanding of instructions that were given to them at the PAT appointment. Patient was also instructed that they will need to review over the PAT instructions again at home before surgery.

## 2017-12-10 NOTE — Progress Notes (Signed)
Re-requested medical clearance note and EKG tracing from [facility].  [phone]

## 2017-12-11 ENCOUNTER — Inpatient Hospital Stay (HOSPITAL_COMMUNITY): Payer: Medicare Other

## 2017-12-11 ENCOUNTER — Inpatient Hospital Stay (HOSPITAL_COMMUNITY): Payer: Medicare Other | Admitting: Anesthesiology

## 2017-12-11 ENCOUNTER — Other Ambulatory Visit: Payer: Self-pay

## 2017-12-11 ENCOUNTER — Encounter (HOSPITAL_COMMUNITY): Payer: Self-pay | Admitting: *Deleted

## 2017-12-11 ENCOUNTER — Inpatient Hospital Stay (HOSPITAL_COMMUNITY)
Admission: RE | Admit: 2017-12-11 | Discharge: 2017-12-14 | DRG: 470 | Disposition: A | Discharge status: Home Health | Payer: Medicare Other | Source: Ambulatory Visit | Attending: Orthopedic Surgery | Admitting: Orthopedic Surgery

## 2017-12-11 ENCOUNTER — Encounter (HOSPITAL_COMMUNITY)
Admission: RE | Disposition: A | Discharge status: Home Health | Payer: Self-pay | Source: Ambulatory Visit | Attending: Orthopedic Surgery

## 2017-12-11 DIAGNOSIS — J40 Bronchitis, not specified as acute or chronic: Secondary | ICD-10-CM | POA: Diagnosis present

## 2017-12-11 DIAGNOSIS — T18108A Unspecified foreign body in esophagus causing other injury, initial encounter: Secondary | ICD-10-CM | POA: Diagnosis not present

## 2017-12-11 DIAGNOSIS — M810 Age-related osteoporosis without current pathological fracture: Secondary | ICD-10-CM | POA: Diagnosis present

## 2017-12-11 DIAGNOSIS — I739 Peripheral vascular disease, unspecified: Secondary | ICD-10-CM | POA: Diagnosis present

## 2017-12-11 DIAGNOSIS — D649 Anemia, unspecified: Secondary | ICD-10-CM | POA: Diagnosis present

## 2017-12-11 DIAGNOSIS — E871 Hypo-osmolality and hyponatremia: Secondary | ICD-10-CM | POA: Diagnosis present

## 2017-12-11 DIAGNOSIS — J988 Other specified respiratory disorders: Secondary | ICD-10-CM

## 2017-12-11 DIAGNOSIS — N183 Chronic kidney disease, stage 3 unspecified: Secondary | ICD-10-CM | POA: Diagnosis present

## 2017-12-11 DIAGNOSIS — Z87891 Personal history of nicotine dependence: Secondary | ICD-10-CM | POA: Diagnosis not present

## 2017-12-11 DIAGNOSIS — R1310 Dysphagia, unspecified: Secondary | ICD-10-CM | POA: Diagnosis not present

## 2017-12-11 DIAGNOSIS — J441 Chronic obstructive pulmonary disease with (acute) exacerbation: Secondary | ICD-10-CM | POA: Diagnosis present

## 2017-12-11 DIAGNOSIS — K2100 Gastro-esophageal reflux disease with esophagitis, without bleeding: Secondary | ICD-10-CM

## 2017-12-11 DIAGNOSIS — Z7983 Long term (current) use of bisphosphonates: Secondary | ICD-10-CM

## 2017-12-11 DIAGNOSIS — I129 Hypertensive chronic kidney disease with stage 1 through stage 4 chronic kidney disease, or unspecified chronic kidney disease: Secondary | ICD-10-CM | POA: Diagnosis present

## 2017-12-11 DIAGNOSIS — K224 Dyskinesia of esophagus: Secondary | ICD-10-CM | POA: Diagnosis present

## 2017-12-11 DIAGNOSIS — M25561 Pain in right knee: Secondary | ICD-10-CM | POA: Diagnosis present

## 2017-12-11 DIAGNOSIS — K449 Diaphragmatic hernia without obstruction or gangrene: Secondary | ICD-10-CM | POA: Diagnosis present

## 2017-12-11 DIAGNOSIS — R059 Cough, unspecified: Secondary | ICD-10-CM

## 2017-12-11 DIAGNOSIS — K219 Gastro-esophageal reflux disease without esophagitis: Secondary | ICD-10-CM | POA: Diagnosis present

## 2017-12-11 DIAGNOSIS — Z9049 Acquired absence of other specified parts of digestive tract: Secondary | ICD-10-CM | POA: Diagnosis not present

## 2017-12-11 DIAGNOSIS — R0902 Hypoxemia: Secondary | ICD-10-CM | POA: Diagnosis present

## 2017-12-11 DIAGNOSIS — Z90710 Acquired absence of both cervix and uterus: Secondary | ICD-10-CM

## 2017-12-11 DIAGNOSIS — E785 Hyperlipidemia, unspecified: Secondary | ICD-10-CM | POA: Diagnosis present

## 2017-12-11 DIAGNOSIS — R05 Cough: Secondary | ICD-10-CM

## 2017-12-11 DIAGNOSIS — I10 Essential (primary) hypertension: Secondary | ICD-10-CM | POA: Diagnosis present

## 2017-12-11 DIAGNOSIS — M1711 Unilateral primary osteoarthritis, right knee: Secondary | ICD-10-CM | POA: Diagnosis present

## 2017-12-11 DIAGNOSIS — J449 Chronic obstructive pulmonary disease, unspecified: Secondary | ICD-10-CM | POA: Diagnosis present

## 2017-12-11 DIAGNOSIS — Z96651 Presence of right artificial knee joint: Secondary | ICD-10-CM | POA: Diagnosis not present

## 2017-12-11 DIAGNOSIS — Z96652 Presence of left artificial knee joint: Secondary | ICD-10-CM | POA: Diagnosis present

## 2017-12-11 DIAGNOSIS — J690 Pneumonitis due to inhalation of food and vomit: Secondary | ICD-10-CM | POA: Diagnosis not present

## 2017-12-11 DIAGNOSIS — M7121 Synovial cyst of popliteal space [Baker], right knee: Secondary | ICD-10-CM | POA: Diagnosis present

## 2017-12-11 HISTORY — PX: TOTAL KNEE ARTHROPLASTY: SHX125

## 2017-12-11 SURGERY — ARTHROPLASTY, KNEE, TOTAL
Anesthesia: Monitor Anesthesia Care | Site: Knee | Laterality: Right

## 2017-12-11 MED ORDER — OXYCODONE-ACETAMINOPHEN 5-325 MG PO TABS
ORAL_TABLET | ORAL | Status: AC
  Filled 2017-12-11: qty 2

## 2017-12-11 MED ORDER — MENTHOL 3 MG MT LOZG: 1.0000 | LOZENGE | OROMUCOSAL | Status: DC | PRN

## 2017-12-11 MED ORDER — FENTANYL CITRATE (PF) 100 MCG/2ML IJ SOLN
25.0000 ug | INTRAMUSCULAR | Status: DC | PRN
  Administered 2017-12-11 (×2): 50 ug via INTRAVENOUS

## 2017-12-11 MED ORDER — MORPHINE SULFATE (PF) 2 MG/ML IV SOLN
0.5000 mg | INTRAVENOUS | Status: DC | PRN
  Administered 2017-12-11 (×2): 1 mg via INTRAVENOUS
  Filled 2017-12-11 (×2): qty 1

## 2017-12-11 MED ORDER — METOCLOPRAMIDE HCL 5 MG PO TABS: 5.0000 mg | ORAL_TABLET | Freq: Three times a day (TID) | ORAL | Status: DC | PRN

## 2017-12-11 MED ORDER — OXYCODONE HCL 5 MG PO TABS: 5.0000 mg | ORAL_TABLET | Freq: Once | ORAL | Status: DC | PRN

## 2017-12-11 MED ORDER — ATORVASTATIN CALCIUM 10 MG PO TABS
10.0000 mg | ORAL_TABLET | ORAL | Status: DC
  Administered 2017-12-12: 10 mg via ORAL
  Filled 2017-12-11 (×2): qty 1

## 2017-12-11 MED ORDER — PHENYLEPHRINE HCL 10 MG/ML IJ SOLN
INTRAVENOUS | Status: DC | PRN
  Administered 2017-12-11: 25 ug/min via INTRAVENOUS

## 2017-12-11 MED ORDER — METHOCARBAMOL 500 MG PO TABS
ORAL_TABLET | ORAL | Status: AC
  Filled 2017-12-11: qty 1

## 2017-12-11 MED ORDER — CLINDAMYCIN PHOSPHATE 600 MG/50ML IV SOLN
600.0000 mg | Freq: Four times a day (QID) | INTRAVENOUS | Status: AC
  Administered 2017-12-11 – 2017-12-12 (×2): 600 mg via INTRAVENOUS
  Filled 2017-12-11 (×2): qty 50

## 2017-12-11 MED ORDER — FENTANYL CITRATE (PF) 100 MCG/2ML IJ SOLN
50.0000 ug | Freq: Once | INTRAMUSCULAR | Status: AC
  Administered 2017-12-11: 50 ug via INTRAVENOUS

## 2017-12-11 MED ORDER — METHOCARBAMOL 500 MG PO TABS
500.0000 mg | ORAL_TABLET | Freq: Four times a day (QID) | ORAL | Status: DC | PRN
  Administered 2017-12-11 – 2017-12-12 (×4): 500 mg via ORAL
  Filled 2017-12-11 (×3): qty 1

## 2017-12-11 MED ORDER — ONDANSETRON HCL 4 MG/2ML IJ SOLN
INTRAMUSCULAR | Status: DC | PRN
  Administered 2017-12-11: 4 mg via INTRAVENOUS

## 2017-12-11 MED ORDER — ALBUTEROL SULFATE (2.5 MG/3ML) 0.083% IN NEBU
2.5000 mg | INHALATION_SOLUTION | Freq: Four times a day (QID) | RESPIRATORY_TRACT | Status: DC | PRN
  Administered 2017-12-13: 2.5 mg via RESPIRATORY_TRACT
  Filled 2017-12-11: qty 3

## 2017-12-11 MED ORDER — MIDAZOLAM HCL 2 MG/2ML IJ SOLN
INTRAMUSCULAR | Status: AC
  Administered 2017-12-11: 1 mg via INTRAVENOUS
  Filled 2017-12-11: qty 2

## 2017-12-11 MED ORDER — POTASSIUM 99 MG PO TABS: 99.0000 mg | ORAL_TABLET | ORAL | Status: DC

## 2017-12-11 MED ORDER — PANTOPRAZOLE SODIUM 40 MG PO TBEC
40.0000 mg | DELAYED_RELEASE_TABLET | Freq: Every day | ORAL | Status: DC
  Administered 2017-12-12 – 2017-12-13 (×2): 40 mg via ORAL
  Filled 2017-12-11 (×3): qty 1

## 2017-12-11 MED ORDER — ASPIRIN 81 MG PO CHEW
81.0000 mg | CHEWABLE_TABLET | Freq: Two times a day (BID) | ORAL | Status: DC
  Administered 2017-12-11 – 2017-12-13 (×5): 81 mg via ORAL
  Filled 2017-12-11 (×6): qty 1

## 2017-12-11 MED ORDER — BUPIVACAINE IN DEXTROSE 0.75-8.25 % IT SOLN
INTRATHECAL | Status: DC | PRN
  Administered 2017-12-11: 1.8 mL via INTRATHECAL

## 2017-12-11 MED ORDER — CLINDAMYCIN PHOSPHATE 900 MG/50ML IV SOLN
INTRAVENOUS | Status: AC
  Filled 2017-12-11: qty 50

## 2017-12-11 MED ORDER — PROPOFOL 500 MG/50ML IV EMUL
INTRAVENOUS | Status: DC | PRN
  Administered 2017-12-11: 100 ug/kg/min via INTRAVENOUS

## 2017-12-11 MED ORDER — ACETAMINOPHEN 160 MG/5ML PO SOLN: 325.0000 mg | ORAL | Status: DC | PRN

## 2017-12-11 MED ORDER — HYDROCODONE-ACETAMINOPHEN 5-325 MG PO TABS
1.0000 | ORAL_TABLET | ORAL | Status: DC | PRN
  Administered 2017-12-11 – 2017-12-14 (×8): 2 via ORAL
  Filled 2017-12-11 (×8): qty 2

## 2017-12-11 MED ORDER — OXYCODONE HCL 5 MG PO TABS: 5.0000 mg | ORAL_TABLET | ORAL | 0 refills | Status: DC | PRN

## 2017-12-11 MED ORDER — SODIUM CHLORIDE 0.9 % IV SOLN
500.0000 mL | INTRAVENOUS | Status: DC
  Administered 2017-12-11: 500 mL via INTRAVENOUS

## 2017-12-11 MED ORDER — CHLORHEXIDINE GLUCONATE 4 % EX LIQD: 60.0000 mL | Freq: Once | CUTANEOUS | Status: DC

## 2017-12-11 MED ORDER — FERROUS SULFATE 325 (65 FE) MG PO TABS
325.0000 mg | ORAL_TABLET | Freq: Three times a day (TID) | ORAL | Status: DC
  Administered 2017-12-11 – 2017-12-13 (×6): 325 mg via ORAL
  Filled 2017-12-11 (×8): qty 1

## 2017-12-11 MED ORDER — OXYCODONE-ACETAMINOPHEN 5-325 MG PO TABS
1.0000 | ORAL_TABLET | Freq: Four times a day (QID) | ORAL | Status: DC | PRN
  Administered 2017-12-11 – 2017-12-13 (×2): 2 via ORAL
  Filled 2017-12-11: qty 2

## 2017-12-11 MED ORDER — METOCLOPRAMIDE HCL 5 MG/ML IJ SOLN: 5.0000 mg | Freq: Three times a day (TID) | INTRAMUSCULAR | Status: DC | PRN

## 2017-12-11 MED ORDER — HYDROCHLOROTHIAZIDE 25 MG PO TABS
25.0000 mg | ORAL_TABLET | Freq: Every day | ORAL | Status: DC
  Administered 2017-12-12 – 2017-12-13 (×2): 25 mg via ORAL
  Filled 2017-12-11 (×3): qty 1

## 2017-12-11 MED ORDER — CLINDAMYCIN PHOSPHATE 900 MG/50ML IV SOLN
900.0000 mg | INTRAVENOUS | Status: AC
  Administered 2017-12-11: 900 mg via INTRAVENOUS

## 2017-12-11 MED ORDER — SENNOSIDES-DOCUSATE SODIUM 8.6-50 MG PO TABS
1.0000 | ORAL_TABLET | Freq: Two times a day (BID) | ORAL | Status: DC
  Administered 2017-12-11 – 2017-12-13 (×5): 1 via ORAL
  Filled 2017-12-11 (×6): qty 1

## 2017-12-11 MED ORDER — MIDAZOLAM HCL 2 MG/2ML IJ SOLN
1.0000 mg | Freq: Once | INTRAMUSCULAR | Status: AC
  Administered 2017-12-11: 1 mg via INTRAVENOUS

## 2017-12-11 MED ORDER — ACETAMINOPHEN 325 MG PO TABS: 325.0000 mg | ORAL_TABLET | ORAL | Status: DC | PRN

## 2017-12-11 MED ORDER — ROPIVACAINE HCL 7.5 MG/ML IJ SOLN
INTRAMUSCULAR | Status: DC | PRN
  Administered 2017-12-11: 20 mL via PERINEURAL

## 2017-12-11 MED ORDER — FENTANYL CITRATE (PF) 100 MCG/2ML IJ SOLN
INTRAMUSCULAR | Status: AC
  Administered 2017-12-11: 50 ug via INTRAVENOUS
  Filled 2017-12-11: qty 2

## 2017-12-11 MED ORDER — POLYETHYLENE GLYCOL 3350 17 G PO PACK
17.0000 g | PACK | Freq: Every day | ORAL | Status: DC
  Administered 2017-12-12 – 2017-12-13 (×2): 17 g via ORAL
  Filled 2017-12-11 (×3): qty 1

## 2017-12-11 MED ORDER — ALENDRONATE SODIUM 70 MG PO TABS: 70.0000 mg | ORAL_TABLET | ORAL | Status: DC

## 2017-12-11 MED ORDER — ASPIRIN EC 325 MG PO TBEC: 650.0000 mg | DELAYED_RELEASE_TABLET | Freq: Every day | ORAL | Status: DC | PRN

## 2017-12-11 MED ORDER — MOMETASONE FURO-FORMOTEROL FUM 200-5 MCG/ACT IN AERO
2.0000 | INHALATION_SPRAY | Freq: Two times a day (BID) | RESPIRATORY_TRACT | Status: DC
  Administered 2017-12-11 – 2017-12-13 (×5): 2 via RESPIRATORY_TRACT
  Filled 2017-12-11 (×2): qty 8.8

## 2017-12-11 MED ORDER — FLUOXETINE HCL 20 MG PO CAPS
40.0000 mg | ORAL_CAPSULE | Freq: Every day | ORAL | Status: DC
  Administered 2017-12-12 – 2017-12-13 (×2): 40 mg via ORAL
  Filled 2017-12-11 (×6): qty 2

## 2017-12-11 MED ORDER — 0.9 % SODIUM CHLORIDE (POUR BTL) OPTIME
TOPICAL | Status: DC | PRN
  Administered 2017-12-11: 1000 mL

## 2017-12-11 MED ORDER — ONDANSETRON HCL 4 MG PO TABS: 4.0000 mg | ORAL_TABLET | Freq: Four times a day (QID) | ORAL | Status: DC | PRN

## 2017-12-11 MED ORDER — DOCUSATE SODIUM 100 MG PO CAPS
100.0000 mg | ORAL_CAPSULE | Freq: Two times a day (BID) | ORAL | Status: DC
  Administered 2017-12-11 – 2017-12-13 (×5): 100 mg via ORAL
  Filled 2017-12-11 (×6): qty 1

## 2017-12-11 MED ORDER — VITAMIN D 1000 UNITS PO TABS
1000.0000 [IU] | ORAL_TABLET | Freq: Every day | ORAL | Status: DC
  Administered 2017-12-12 – 2017-12-13 (×2): 1000 [IU] via ORAL
  Filled 2017-12-11 (×4): qty 1

## 2017-12-11 MED ORDER — TRAMADOL HCL 50 MG PO TABS
50.0000 mg | ORAL_TABLET | Freq: Two times a day (BID) | ORAL | Status: DC | PRN
  Administered 2017-12-14: 50 mg via ORAL
  Filled 2017-12-11: qty 1

## 2017-12-11 MED ORDER — HYDROCODONE-ACETAMINOPHEN 7.5-325 MG PO TABS
1.0000 | ORAL_TABLET | ORAL | Status: DC | PRN
  Administered 2017-12-11 – 2017-12-12 (×2): 2 via ORAL
  Filled 2017-12-11 (×2): qty 2

## 2017-12-11 MED ORDER — SODIUM CHLORIDE 0.9 % IV SOLN: INTRAVENOUS | Status: DC

## 2017-12-11 MED ORDER — LOSARTAN POTASSIUM 50 MG PO TABS
100.0000 mg | ORAL_TABLET | Freq: Every day | ORAL | Status: DC
  Administered 2017-12-12 – 2017-12-13 (×2): 100 mg via ORAL
  Filled 2017-12-11 (×3): qty 2

## 2017-12-11 MED ORDER — DILTIAZEM HCL ER COATED BEADS 120 MG PO CP24
120.0000 mg | ORAL_CAPSULE | Freq: Every day | ORAL | Status: DC
  Administered 2017-12-12 – 2017-12-13 (×2): 120 mg via ORAL
  Filled 2017-12-11 (×3): qty 1

## 2017-12-11 MED ORDER — FLUTICASONE PROPIONATE 50 MCG/ACT NA SUSP: 1.0000 | Freq: Every day | NASAL | Status: DC | PRN

## 2017-12-11 MED ORDER — PHENOL 1.4 % MT LIQD: 1.0000 | OROMUCOSAL | Status: DC | PRN

## 2017-12-11 MED ORDER — METHOCARBAMOL 1000 MG/10ML IJ SOLN
500.0000 mg | Freq: Four times a day (QID) | INTRAVENOUS | Status: DC | PRN
  Filled 2017-12-11: qty 5

## 2017-12-11 MED ORDER — ACETAMINOPHEN 325 MG PO TABS: 325.0000 mg | ORAL_TABLET | Freq: Four times a day (QID) | ORAL | Status: DC | PRN

## 2017-12-11 MED ORDER — SODIUM CHLORIDE 0.9 % IR SOLN
Status: DC | PRN
  Administered 2017-12-11: 3000 mL

## 2017-12-11 MED ORDER — ONDANSETRON HCL 4 MG/2ML IJ SOLN
4.0000 mg | Freq: Four times a day (QID) | INTRAMUSCULAR | Status: DC | PRN
  Administered 2017-12-11 – 2017-12-12 (×2): 4 mg via INTRAVENOUS
  Filled 2017-12-11 (×2): qty 2

## 2017-12-11 MED ORDER — OXYCODONE HCL 5 MG/5ML PO SOLN: 5.0000 mg | Freq: Once | ORAL | Status: DC | PRN

## 2017-12-11 MED ORDER — LACTATED RINGERS IV SOLN
INTRAVENOUS | Status: DC
  Administered 2017-12-11: 11:00:00 via INTRAVENOUS

## 2017-12-11 SURGICAL SUPPLY — 60 items
BANDAGE ACE 6X5 VEL STRL LF (GAUZE/BANDAGES/DRESSINGS) ×1 IMPLANT
BANDAGE ESMARK 6X9 LF (GAUZE/BANDAGES/DRESSINGS) ×1 IMPLANT
BLADE SAG 18X100X1.27 (BLADE) ×2 IMPLANT
BLADE SAW SGTL 13X75X1.27 (BLADE) ×2 IMPLANT
BNDG CMPR 9X6 STRL LF SNTH (GAUZE/BANDAGES/DRESSINGS) ×1
BNDG CMPR MED 10X6 ELC LF (GAUZE/BANDAGES/DRESSINGS) ×1
BNDG ELASTIC 6X10 VLCR STRL LF (GAUZE/BANDAGES/DRESSINGS) ×2 IMPLANT
BNDG ESMARK 6X9 LF (GAUZE/BANDAGES/DRESSINGS) ×2
BNDG GAUZE ELAST 4 BULKY (GAUZE/BANDAGES/DRESSINGS) ×3 IMPLANT
BOWL SMART MIX CTS (DISPOSABLE) ×2 IMPLANT
CAP KNEE TOTAL 3 SIGMA ×1 IMPLANT
CEMENT HV SMART SET (Cement) ×4 IMPLANT
COVER SURGICAL LIGHT HANDLE (MISCELLANEOUS) ×2 IMPLANT
CUFF TOURNIQUET SINGLE 34IN LL (TOURNIQUET CUFF) IMPLANT
CUFF TOURNIQUET SINGLE 44IN (TOURNIQUET CUFF) IMPLANT
DRAPE EXTREMITY T 121X128X90 (DRAPE) ×2 IMPLANT
DRAPE HALF SHEET 40X57 (DRAPES) ×2 IMPLANT
DRAPE U-SHAPE 47X51 STRL (DRAPES) ×2 IMPLANT
DRSG ADAPTIC 3X8 NADH LF (GAUZE/BANDAGES/DRESSINGS) ×2 IMPLANT
DRSG PAD ABDOMINAL 8X10 ST (GAUZE/BANDAGES/DRESSINGS) ×4 IMPLANT
DURAPREP 26ML APPLICATOR (WOUND CARE) ×2 IMPLANT
ELECT CAUTERY BLADE 6.4 (BLADE) ×2 IMPLANT
ELECT REM PT RETURN 9FT ADLT (ELECTROSURGICAL) ×2
ELECTRODE REM PT RTRN 9FT ADLT (ELECTROSURGICAL) ×1 IMPLANT
FACESHIELD STD STERILE (MASK) ×1 IMPLANT
GAUZE SPONGE 4X4 12PLY STRL (GAUZE/BANDAGES/DRESSINGS) ×2 IMPLANT
GAUZE SPONGE 4X4 12PLY STRL LF (GAUZE/BANDAGES/DRESSINGS) ×1 IMPLANT
GLOVE BIOGEL PI ORTHO PRO 7.5 (GLOVE) ×1
GLOVE BIOGEL PI ORTHO PRO SZ8 (GLOVE) ×1
GLOVE ORTHO TXT STRL SZ7.5 (GLOVE) ×2 IMPLANT
GLOVE PI ORTHO PRO STRL 7.5 (GLOVE) ×1 IMPLANT
GLOVE PI ORTHO PRO STRL SZ8 (GLOVE) ×1 IMPLANT
GLOVE SURG ORTHO 8.5 STRL (GLOVE) ×2 IMPLANT
GOWN STRL REUS W/ TWL XL LVL3 (GOWN DISPOSABLE) ×3 IMPLANT
GOWN STRL REUS W/TWL XL LVL3 (GOWN DISPOSABLE) ×6
HANDPIECE INTERPULSE COAX TIP (DISPOSABLE) ×2
IMMOBILIZER KNEE 22 UNIV (SOFTGOODS) IMPLANT
KIT BASIN OR (CUSTOM PROCEDURE TRAY) ×2 IMPLANT
KIT MANIFOLD (MISCELLANEOUS) ×2 IMPLANT
KIT ROOM TURNOVER OR (KITS) ×2 IMPLANT
MANIFOLD NEPTUNE II (INSTRUMENTS) ×2 IMPLANT
NS IRRIG 1000ML POUR BTL (IV SOLUTION) ×2 IMPLANT
PACK TOTAL JOINT (CUSTOM PROCEDURE TRAY) ×2 IMPLANT
PAD ABD 8X10 STRL (GAUZE/BANDAGES/DRESSINGS) ×1 IMPLANT
PAD ARMBOARD 7.5X6 YLW CONV (MISCELLANEOUS) ×4 IMPLANT
PIN STEINMAN FIXATION KNEE (PIN) IMPLANT
SET HNDPC FAN SPRY TIP SCT (DISPOSABLE) ×1 IMPLANT
STRIP CLOSURE SKIN 1/2X4 (GAUZE/BANDAGES/DRESSINGS) ×4 IMPLANT
SUCTION FRAZIER HANDLE 10FR (MISCELLANEOUS) ×1
SUCTION TUBE FRAZIER 10FR DISP (MISCELLANEOUS) ×1 IMPLANT
SUT MNCRL AB 3-0 PS2 18 (SUTURE) ×2 IMPLANT
SUT VIC AB 0 CT1 27 (SUTURE) ×4
SUT VIC AB 0 CT1 27XBRD ANBCTR (SUTURE) ×2 IMPLANT
SUT VIC AB 1 CT1 27 (SUTURE) ×6
SUT VIC AB 1 CT1 27XBRD ANBCTR (SUTURE) ×3 IMPLANT
SUT VIC AB 2-0 CT1 27 (SUTURE) ×4
SUT VIC AB 2-0 CT1 TAPERPNT 27 (SUTURE) ×2 IMPLANT
TOWEL OR 17X24 6PK STRL BLUE (TOWEL DISPOSABLE) ×2 IMPLANT
TOWEL OR 17X26 10 PK STRL BLUE (TOWEL DISPOSABLE) ×2 IMPLANT
TRAY FOLEY BAG SILVER LF 14FR (CATHETERS) ×1 IMPLANT

## 2017-12-11 NOTE — Anesthesia Procedure Notes (Signed)
Anesthesia Regional Block: Adductor canal block   Pre-Anesthetic Checklist: ,, timeout performed, Correct Patient, Correct Site, Correct Laterality, Correct Procedure, Correct Position, site marked, Risks and benefits discussed,  Surgical consent,  Pre-op evaluation,  At surgeon's request and post-op pain management  Laterality: Lower and Right  Prep: chloraprep       Needles:  Injection technique: Single-shot  Needle Type: Echogenic Stimulator Needle          Additional Needles:   Procedures:,,,, ultrasound used (permanent image in chart),,,,  Narrative:  Start time: 12/11/2017 11:49 AM End time: 12/11/2017 11:52 AM Injection made incrementally with aspirations every 5 mL.  Performed by: Personally  Anesthesiologist: Val EagleMoser, Linsey Hirota, MD  Additional Notes: H+P and labs reviewed, risks and benefits discussed with patient, procedure tolerated well without complications

## 2017-12-11 NOTE — Brief Op Note (Signed)
12/11/2017  2:51 PM  PATIENT:  Deanna Acosta  82 y.o. female  PRE-OPERATIVE DIAGNOSIS:  right knee end stage osteoarthritis  POST-OPERATIVE DIAGNOSIS:  right knee end stage osteoarthritis  PROCEDURE:  Procedure(s): TOTAL RIGHT KNEE ARTHROPLASTY (Right) DePuy Sigma RP  SURGEON:  Surgeon(s) and Role:    Beverely Low* Landrey Mahurin, MD - Primary  PHYSICIAN ASSISTANT:   ASSISTANTS: Thea Gisthomas B Dixon, PA-C   ANESTHESIA:   regional and spinal  EBL:  50 mL   BLOOD ADMINISTERED:none  DRAINS: none   LOCAL MEDICATIONS USED:  NONE  SPECIMEN:  No Specimen  DISPOSITION OF SPECIMEN:  N/A  COUNTS:  YES  TOURNIQUET:  * Missing tourniquet times found for documented tourniquets in log: [id number] *  DICTATION: .Other Dictation: Dictation Number [id number]  PLAN OF CARE: Admit to inpatient   PATIENT DISPOSITION:  PACU - hemodynamically stable.   Delay start of Pharmacological VTE agent (>24hrs) due to surgical blood loss or risk of bleeding: no

## 2017-12-11 NOTE — Plan of Care (Signed)
  Progressing Education: Knowledge of General Education information will improve 12/11/2017 1708 - Progressing by Darreld Mcleanox, Vala Raffo, RN Health Behavior/Discharge Planning: Ability to manage health-related needs will improve 12/11/2017 1708 - Progressing by Darreld Mcleanox, Verneice Caspers, RN Clinical Measurements: Ability to maintain clinical measurements within normal limits will improve 12/11/2017 1708 - Progressing by Darreld Mcleanox, Dejay Kronk, RN Will remain free from infection 12/11/2017 1708 - Progressing by Darreld Mcleanox, Terianna Peggs, RN Diagnostic test results will improve 12/11/2017 1708 - Progressing by Darreld Mcleanox, Foday Cone, RN Respiratory complications will improve 12/11/2017 1708 - Progressing by Darreld Mcleanox, Sophiarose Eades, RN Cardiovascular complication will be avoided 12/11/2017 1708 - Progressing by Darreld Mcleanox, Owyn Raulston, RN Activity: Risk for activity intolerance will decrease 12/11/2017 1708 - Progressing by Darreld Mcleanox, Kassey Laforest, RN Nutrition: Adequate nutrition will be maintained 12/11/2017 1708 - Progressing by Darreld Mcleanox, Caralee Morea, RN Coping: Level of anxiety will decrease 12/11/2017 1708 - Progressing by Darreld Mcleanox, Ivory Bail, RN Elimination: Will not experience complications related to bowel motility 12/11/2017 1708 - Progressing by Darreld Mcleanox, Delmas Faucett, RN Will not experience complications related to urinary retention 12/11/2017 1708 - Progressing by Darreld Mcleanox, Meli Faley, RN Pain Managment: General experience of comfort will improve 12/11/2017 1708 - Progressing by Darreld Mcleanox, Jaiyah Beining, RN Safety: Ability to remain free from injury will improve 12/11/2017 1708 - Progressing by Darreld Mcleanox, Blaike Vickers, RN Skin Integrity: Risk for impaired skin integrity will decrease 12/11/2017 1708 - Progressing by Darreld Mcleanox, Tawsha Terrero, RN

## 2017-12-11 NOTE — Anesthesia Preprocedure Evaluation (Addendum)
Anesthesia Evaluation  Patient identified by MRN, date of birth, ID band Patient awake    Reviewed: Allergy & Precautions, H&P , NPO status , Patient's Chart, lab work & pertinent test results  History of Anesthesia Complications (+) PONV and history of anesthetic complications  Airway Mallampati: II  TM Distance: >3 FB Neck ROM: Full    Dental  (+) Teeth Intact, Partial Upper, Dental Advisory Given,    Pulmonary shortness of breath, asthma , pneumonia, resolved, COPD, former smoker,    breath sounds clear to auscultation       Cardiovascular hypertension, Pt. on medications + Peripheral Vascular Disease   Rhythm:Regular Rate:Normal     Neuro/Psych  Headaches, PSYCHIATRIC DISORDERS Anxiety Depression  Neuromuscular disease    GI/Hepatic hiatal hernia, GERD  Controlled and Medicated,  Endo/Other    Renal/GU Renal InsufficiencyRenal disease     Musculoskeletal  (+) Arthritis ,   Abdominal   Peds  Hematology  (+) Blood dyscrasia, anemia ,   Anesthesia Other Findings   Reproductive/Obstetrics                            Anesthesia Physical Anesthesia Plan  ASA: III  Anesthesia Plan: MAC, Regional and Spinal   Post-op Pain Management:    Induction:   PONV Risk Score and Plan: 3 and Treatment may vary due to age or medical condition  Airway Management Planned: Nasal Cannula  Additional Equipment: None  Intra-op Plan:   Post-operative Plan:   Informed Consent: I have reviewed the patients History and Physical, chart, labs and discussed the procedure including the risks, benefits and alternatives for the proposed anesthesia with the patient or authorized representative who has indicated his/her understanding and acceptance.   Dental advisory given  Plan Discussed with: CRNA and Surgeon  Anesthesia Plan Comments:         Anesthesia Quick Evaluation

## 2017-12-11 NOTE — Interval H&P Note (Signed)
History and Physical Interval Note:  12/11/2017 12:19 PM  Deanna Acosta  has presented today for surgery, with the diagnosis of right knee end stage osteoarthritis  The various methods of treatment have been discussed with the patient and family. After consideration of risks, benefits and other options for treatment, the patient has consented to  Procedure(s): TOTAL RIGHT KNEE ARTHROPLASTY (Right) as a surgical intervention .  The patient's history has been reviewed, patient examined, no change in status, stable for surgery.  I have reviewed the patient's chart and labs.  Questions were answered to the patient's satisfaction.     Sybilla Malhotra,STEVEN R

## 2017-12-11 NOTE — Anesthesia Procedure Notes (Signed)
Spinal  Patient location during procedure: OR Start time: 12/11/2017 12:48 PM End time: 12/11/2017 12:51 PM Staffing Anesthesiologist: Val EagleMoser, Ethan Clayburn, MD Preanesthetic Checklist Completed: patient identified, surgical consent, pre-op evaluation, timeout performed, IV checked, risks and benefits discussed and monitors and equipment checked Spinal Block Patient position: sitting Prep: site prepped and draped and DuraPrep Patient monitoring: heart rate, cardiac monitor, continuous pulse ox and blood pressure Approach: midline Location: L4-5 Injection technique: single-shot Needle Needle type: Pencan  Needle gauge: 24 G Needle length: 10 cm Assessment Sensory level: T6

## 2017-12-11 NOTE — Transfer of Care (Signed)
Immediate Anesthesia Transfer of Care Note  Patient: Deanna Acosta  Procedure(s) Performed: TOTAL RIGHT KNEE ARTHROPLASTY (Right Knee)  Patient Location: PACU  Anesthesia Type:GA combined with regional for post-op pain  Level of Consciousness: awake, alert  and oriented  Airway & Oxygen Therapy: Patient Spontanous Breathing and Patient connected to nasal cannula oxygen  Post-op Assessment: Report given to RN, Post -op Vital signs reviewed and stable and Patient moving all extremities X 4  Post vital signs: Reviewed and stable  Last Vitals:  Vitals:   12/11/17 1027 12/11/17 1452  BP: (!) 155/59   Pulse: 82   Resp: 20   Temp: 37.1 C (!) (P) 36.3 C  SpO2: 98%     Last Pain:  Vitals:   12/11/17 1452  TempSrc:   PainSc: (P) 0-No pain      Patients Stated Pain Goal: 3 (12/11/17 1056)  Complications: No apparent anesthesia complications

## 2017-12-11 NOTE — Progress Notes (Signed)
Orthopedic Tech Progress Note Patient Details:  Loralie ChampagneGeraldine G Mort 03-11-1936 [id number]  CPM Right Knee CPM Right Knee: On Right Knee Flexion (Degrees): 90 Right Knee Extension (Degrees): 0  Post Interventions Patient Tolerated: Well Instructions Provided: Care of device Ortho Devices Type of Ortho Device: Bone foam zero knee Ortho Device/Splint Interventions: Ordered, Application, Adjustment   Post Interventions Patient Tolerated: Well Instructions Provided: Care of device   Jennye MoccasinHughes, Rudolph Daoust Craig 12/11/2017, 3:39 PM

## 2017-12-11 NOTE — Anesthesia Procedure Notes (Signed)
Procedure Name: MAC Date/Time: 12/11/2017 12:55 PM Performed by: Kyung Rudd, CRNA Pre-anesthesia Checklist: Patient identified, Emergency Drugs available, Suction available and Patient being monitored Patient Re-evaluated:Patient Re-evaluated prior to induction Oxygen Delivery Method: Simple face mask Induction Type: IV induction Dental Injury: Teeth and Oropharynx as per pre-operative assessment

## 2017-12-11 NOTE — Evaluation (Signed)
Physical Therapy Evaluation Patient Details Name: Deanna Acosta MRN: [id number] DOB: 04-17-1936 Today's Date: 12/11/2017   History of Present Illness  Pt is a 82 y/o female s/p elective R TKA. PMH includes asthma, COPD, HTN, osteoporosis, and L TKA.   Clinical Impression  Pt is s/p surgery above with deficits below. Mobility limited to chair this session as pt with increased pain and numbness in R knee following surgery. Required min A with RW for mobility. Supine HEP initiated. Anticipate pt will progress well once pain controlled and sensation improved. Will continue to follow acutely to maximize functional mobility independence and safety.     Follow Up Recommendations Follow surgeon's recommendation for DC plan and follow-up therapies;Supervision for mobility/OOB    Equipment Recommendations  None recommended by PT    Recommendations for Other Services       Precautions / Restrictions Precautions Precautions: Knee Precaution Booklet Issued: Yes (comment) Precaution Comments: Reviewed supine ther ex and knee precautious  Required Braces or Orthoses: Knee Immobilizer - Right Knee Immobilizer - Right: Other (comment)(until discontinued ) Restrictions Weight Bearing Restrictions: Yes RLE Weight Bearing: Weight bearing as tolerated      Mobility  Bed Mobility Overal bed mobility: Needs Assistance Bed Mobility: Supine to Sit     Supine to sit: Min assist     General bed mobility comments: Min A for RLE management and trunk elevation. Increased time to perform secondary to pain.   Transfers Overall transfer level: Needs assistance Equipment used: Rolling walker (2 wheeled) Transfers: Sit to/from [employer] Sit to Stand: Min assist Stand pivot transfers: Min assist       General transfer comment: Min A for lift assist and steadying. Verbal cues for safe hand placement. Pt with increased pain and decreased sensation, therefore mobility limited to  chair. Min A for steadying and verbal cues for sequencing with RW.   Ambulation/Gait             General Gait Details: NT  Stairs            Wheelchair Mobility    Modified Rankin (Stroke Patients Only)       Balance Overall balance assessment: Needs assistance Sitting-balance support: No upper extremity supported;Feet supported Sitting balance-Leahy Scale: Good     Standing balance support: Bilateral upper extremity supported;During functional activity Standing balance-Leahy Scale: Poor Standing balance comment: Reliant on RW for stability                              Pertinent Vitals/Pain Pain Assessment: 0-10 Pain Score: 10-Worst pain ever Pain Location: R knee  Pain Descriptors / Indicators: Aching;Operative site guarding Pain Intervention(s): Limited activity within patient's tolerance;Monitored during session;Repositioned    Home Living Family/patient expects to be discharged to:: Private residence Living Arrangements: Spouse/significant other Available Help at Discharge: Family;Available 24 hours/day Type of Home: House Home Access: Stairs to enter Entrance Stairs-Rails: Doctor, general practice of Steps: 4 Home Layout: One level Home Equipment: Walker - 2 wheels;Bedside commode;Cane - single point      Prior Function Level of Independence: Independent with assistive device(s)         Comments: Occasional use of RW      Hand Dominance   Dominant Hand: Right    Extremity/Trunk Assessment   Upper Extremity Assessment Upper Extremity Assessment: Defer to OT evaluation    Lower Extremity Assessment Lower Extremity Assessment: RLE deficits/detail RLE Deficits / Details: Reports  decreased sensation. Able to perform ther ex below. Deficits consistent with post op pain and weakness.  RLE Sensation: decreased light touch    Cervical / Trunk Assessment Cervical / Trunk Assessment: Normal  Communication    Communication: No difficulties  Cognition Arousal/Alertness: Awake/alert Behavior During Therapy: WFL for tasks assessed/performed Overall Cognitive Status: Within Functional Limits for tasks assessed                                        General Comments General comments (skin integrity, edema, etc.): Pt's husband present during session.     Exercises Total Joint Exercises Ankle Circles/Pumps: AROM;Both;20 reps Quad Sets: AROM;Right;10 reps Towel Squeeze: AROM;Both;10 reps Heel Slides: AROM;Right;5 reps   Assessment/Plan    PT Assessment Patient needs continued PT services  PT Problem List Decreased strength;Decreased activity tolerance;Decreased range of motion;Decreased balance;Decreased mobility;Decreased knowledge of use of DME;Decreased knowledge of precautions;Impaired sensation;Pain       PT Treatment Interventions Gait training;DME instruction;Stair training;Functional mobility training;Therapeutic activities;Therapeutic exercise;Balance training;Neuromuscular re-education;Patient/family education    PT Goals (Current goals can be found in the Care Plan section)  Acute Rehab PT Goals Patient Stated Goal: to go home  PT Goal Formulation: With patient Time For Goal Achievement: 12/25/17 Potential to Achieve Goals: Good    Frequency 7X/week   Barriers to discharge        Co-evaluation               AM-PAC PT "6 Clicks" Daily Activity  Outcome Measure Difficulty turning over in bed (including adjusting bedclothes, sheets and blankets)?: A Little Difficulty moving from lying on back to sitting on the side of the bed? : Unable Difficulty sitting down on and standing up from a chair with arms (e.g., wheelchair, bedside commode, etc,.)?: Unable Help needed moving to and from a bed to chair (including a wheelchair)?: A Little Help needed walking in hospital room?: A Lot Help needed climbing 3-5 steps with a railing? : A Lot 6 Click Score: 12     End of Session Equipment Utilized During Treatment: Gait belt;Right knee immobilizer Activity Tolerance: Patient limited by pain Patient left: in chair;with call bell/phone within reach Nurse Communication: Mobility status PT Visit Diagnosis: Other abnormalities of gait and mobility (R26.89);Pain Pain - Right/Left: Right Pain - part of body: Knee    Time: [id number] PT Time Calculation (min) (ACUTE ONLY): 27 min   Charges:   PT Evaluation $PT Eval Low Complexity: 1 Low PT Treatments $Therapeutic Activity: 8-22 mins   PT G Codes:        Gladys DammeBrittany Jhoanna Heyde, PT, DPT  Acute Rehabilitation Services  Pager: [phone]   Lehman PromBrittany S Cheetara Hoge 12/11/2017, 6:33 PM

## 2017-12-12 ENCOUNTER — Inpatient Hospital Stay (HOSPITAL_COMMUNITY): Payer: Medicare Other

## 2017-12-12 LAB — BASIC METABOLIC PANEL
Anion gap: 11 (ref 5–15)
BUN: 19 mg/dL (ref 6–20)
CO2: 23 mmol/L (ref 22–32)
Calcium: 8.8 mg/dL — ABNORMAL LOW (ref 8.9–10.3)
Chloride: 100 mmol/L — ABNORMAL LOW (ref 101–111)
Creatinine, Ser: 1.39 mg/dL — ABNORMAL HIGH (ref 0.44–1.00)
GFR calc Af Amer: 41 mL/min — ABNORMAL LOW (ref >60)
GFR calc non Af Amer: 35 mL/min — ABNORMAL LOW (ref >60)
Glucose, Bld: 110 mg/dL — ABNORMAL HIGH (ref 65–99)
Potassium: 4.2 mmol/L (ref 3.5–5.1)
Sodium: 134 mmol/L — ABNORMAL LOW (ref 135–145)

## 2017-12-12 LAB — CBC
HCT: 35 % — ABNORMAL LOW (ref 36.0–46.0)
Hemoglobin: 11.2 g/dL — ABNORMAL LOW (ref 12.0–15.0)
MCH: 28.2 pg (ref 26.0–34.0)
MCHC: 32 g/dL (ref 30.0–36.0)
MCV: 88.2 fL (ref 78.0–100.0)
Platelets: 288 10*3/uL (ref 150–400)
RBC: 3.97 MIL/uL (ref 3.87–5.11)
RDW: 14.6 % (ref 11.5–15.5)
WBC: 12.2 10*3/uL — ABNORMAL HIGH (ref 4.0–10.5)

## 2017-12-12 NOTE — Anesthesia Postprocedure Evaluation (Signed)
Anesthesia Post Note  Patient: Deanna Acosta  Procedure(s) Performed: TOTAL RIGHT KNEE ARTHROPLASTY (Right Knee)     Patient location during evaluation: PACU Anesthesia Type: Regional, MAC and Spinal Level of consciousness: awake and alert Pain management: pain level controlled Vital Signs Assessment: post-procedure vital signs reviewed and stable Respiratory status: spontaneous breathing, nonlabored ventilation, respiratory function stable and patient connected to nasal cannula oxygen Cardiovascular status: stable and blood pressure returned to baseline Postop Assessment: no apparent nausea or vomiting and spinal receding Anesthetic complications: no    Last Vitals:  Vitals:   12/12/17 0957 12/12/17 1300  BP:  (!) 177/72  Pulse:  91  Resp:  16  Temp:  37.1 C  SpO2: 92% 93%    Last Pain:  Vitals:   12/12/17 1300  TempSrc: Oral  PainSc:                  Idaliz Tinkle

## 2017-12-12 NOTE — Progress Notes (Signed)
Orthopedics Progress Note  Subjective: Pain controlled this morning.  Objective:  Vitals:   12/12/17 0040 12/12/17 0440  BP: (!) 146/61 (!) 135/56  Pulse: 83 90  Resp: 16   Temp: 98.9 F (37.2 C) 99.1 F (37.3 C)  SpO2: 91% 93%    General: Awake and alert  Musculoskeletal: Right knee dressing CDI, no cords, good ankle pumps and quad sets. Neurovascularly intact  Lab Results  Component Value Date   WBC 12.2 (H) 12/12/2017   HGB 11.2 (L) 12/12/2017   HCT 35.0 (L) 12/12/2017   MCV 88.2 12/12/2017   PLT 288 12/12/2017       Component Value Date/Time   NA 134 (L) 12/12/2017 0342   K 4.2 12/12/2017 0342   CL 100 (L) 12/12/2017 0342   CO2 23 12/12/2017 0342   GLUCOSE 110 (H) 12/12/2017 0342   BUN 19 12/12/2017 0342   CREATININE 1.39 (H) 12/12/2017 0342   CALCIUM 8.8 (L) 12/12/2017 0342   GFRNONAA 35 (L) 12/12/2017 0342   GFRAA 41 (L) 12/12/2017 0342    Lab Results  Component Value Date   INR 1.48 06/23/2013   INR 1.27 06/22/2013   INR 1.07 06/21/2013    Assessment/Plan: POD #1 s/p Procedure(s): TOTAL RIGHT KNEE ARTHROPLASTY Stable overnight, PT, OT DVT  Prophylaxis Change bandage to Aquacel tomorrow, possible D/C tomorrow  Viviann SpareSteven R. Ranell [name], MD 12/12/2017 9:02 AM

## 2017-12-12 NOTE — Evaluation (Signed)
Occupational Therapy Evaluation Patient Details Name: Deanna Acosta MRN: [id number] DOB: Jan 08, 1936 Today's Date: 12/12/2017    History of Present Illness Pt is a 82 y/o female s/p elective R TKA. PMH includes asthma, COPD, HTN, osteoporosis, and L TKA.    Clinical Impression   Pt. reports she was independent in ADLs PTA. Currently, pt. requires min assist with LB bathing and LB dressing. Pt. was educated on compensatory strategies for LB dressing and educated on the use of DME during LB bathing. Pt. Currently uses 2-wheeled walker for functional mobility tasks and requires min guard for safety and stability of walker. Pt. needs OT services to continue to improve her ability to engage in LB ADLs safely. Pt. would benefit from training on how to complete a tub transfer with shower bench to increase independence in LB bathing. Recommendation is that pt. is discharged home with family and with 24/7 supervision for safety precautions.     Follow Up Recommendations  No OT follow up;Supervision/Assistance - 24 hour(initially)    Equipment Recommendations  None recommended by OT    Recommendations for Other Services       Precautions / Restrictions Precautions Precautions: Knee Precaution Booklet Issued: No Required Braces or Orthoses: Knee Immobilizer - Right Knee Immobilizer - Right: Other (comment)(until discontinued) Restrictions Weight Bearing Restrictions: Yes RLE Weight Bearing: Weight bearing as tolerated      Mobility Bed Mobility Overal bed mobility: Needs Assistance Bed Mobility: Supine to Sit     Supine to sit: Min assist     General bed mobility comments: Tactile cues and physical assist needed for RLE to assume EOB sitting. Pt. required more time to complete bed mobility task.  Transfers Overall transfer level: Needs assistance Equipment used: Rolling walker (2 wheeled) Transfers: Sit to/from Stand Sit to Stand: Min assist         General transfer  comment: physical assist needed for sit-stand and for steadying of walker for balance. Pt. verbalized that she needed help with standing. Pt. required cues for hand placement during transfer.     Balance Overall balance assessment: Needs assistance Sitting-balance support: No upper extremity supported;Feet supported Sitting balance-Leahy Scale: Good     Standing balance support: Bilateral upper extremity supported Standing balance-Leahy Scale: Poor                             ADL either performed or assessed with clinical judgement   ADL Overall ADL's : Needs assistance/impaired Eating/Feeding: Set up;Sitting   Grooming: Set up;Sitting   Upper Body Bathing: Set up;Supervision/ safety;Sitting   Lower Body Bathing: Minimal assistance;Cueing for compensatory techniques Lower Body Bathing Details (indicate cue type and reason): Pt. reports her husband will assist with bathing. Pt. has tub bench. Upper Body Dressing : Set up;Sitting   Lower Body Dressing: Minimal assistance;Cueing for compensatory techniques;Sit to/from stand Lower Body Dressing Details (indicate cue type and reason): Pt. educated on compensatory techniques for LB dressing. Toilet Transfer: Min guard;Cueing for safety;Ambulation;RW Toilet Transfer Details (indicate cue type and reason): Simulated toilet transfer during sit-stand functional mobility task. Toileting- Architect and Hygiene: Min guard;Cueing for safety;Sit to/from Nurse, children's Details (indicate cue type and reason): Educated on use of DME for tub/shower transfer; would benefit from practice prior to d/c. Functional mobility during ADLs: Min guard;Cueing for safety;Rolling walker General ADL Comments: Pt. was educated on compensatory strategies for LB dressing as well as safety precuations  during LB dressing and bathing.     Vision         Perception     Praxis      Pertinent Vitals/Pain Pain Assessment:  Faces Faces Pain Scale: Hurts little more Pain Location: R knee Pain Descriptors / Indicators: Discomfort;Sore Pain Intervention(s): Limited activity within patient's tolerance;Monitored during session;Ice applied     Hand Dominance Right   Extremity/Trunk Assessment Upper Extremity Assessment Upper Extremity Assessment: Overall WFL for tasks assessed   Lower Extremity Assessment Lower Extremity Assessment: Defer to PT evaluation   Cervical / Trunk Assessment Cervical / Trunk Assessment: Normal   Communication Communication Communication: No difficulties   Cognition Arousal/Alertness: Awake/alert Behavior During Therapy: WFL for tasks assessed/performed Overall Cognitive Status: Within Functional Limits for tasks assessed                                     General Comments  SpO2 >94% on RA with activity; reapplied supplemtal O2 at end of session.    Exercises     Shoulder Instructions      Home Living Family/patient expects to be discharged to:: Private residence Living Arrangements: Spouse/significant other Available Help at Discharge: Family;Available 24 hours/day Type of Home: House Home Access: Stairs to enter [employer] CorporationEntrance Stairs-Number of Steps: 4-5   Home Layout: One level     Bathroom Shower/Tub: Tub/shower unit;Walk-in shower(Prefers tub)   Bathroom Toilet: Handicapped height     Home Equipment: Environmental consultantWalker - 2 wheels;Tub bench;Bedside commode          Prior Functioning/Environment Level of Independence: Independent        Comments: Occasional use of RW         OT Problem List: Decreased strength;Decreased range of motion;Decreased activity tolerance;Impaired balance (sitting and/or standing);Decreased safety awareness;Decreased knowledge of use of DME or AE;Decreased knowledge of precautions;Pain;Increased edema      OT Treatment/Interventions: Self-care/ADL training;DME and/or AE instruction;Therapeutic activities;Patient/family  education;Balance training    OT Goals(Current goals can be found in the care plan section) Acute Rehab OT Goals Patient Stated Goal: to go home  OT Goal Formulation: With patient/family Time For Goal Achievement: 12/26/17 Potential to Achieve Goals: Good ADL Goals Pt Will Perform Lower Body Bathing: with supervision;sit to/from stand Pt Will Perform Lower Body Dressing: with supervision;sit to/from stand Pt Will Perform Tub/Shower Transfer: Tub transfer;with supervision;ambulating;tub bench;rolling walker  OT Frequency: Min 2X/week   Barriers to D/C:            Co-evaluation              AM-PAC PT "6 Clicks" Daily Activity     Outcome Measure Help from another person eating meals?: None Help from another person taking care of personal grooming?: None Help from another person toileting, which includes using toliet, bedpan, or urinal?: A Little Help from another person bathing (including washing, rinsing, drying)?: A Little Help from another person to put on and taking off regular upper body clothing?: None Help from another person to put on and taking off regular lower body clothing?: A Little 6 Click Score: 21   End of Session Equipment Utilized During Treatment: Rolling walker;Oxygen CPM Right Knee CPM Right Knee: Off  Activity Tolerance: Patient tolerated treatment well Patient left: in chair;with call bell/phone within reach;with family/visitor present  OT Visit Diagnosis: Unsteadiness on feet (R26.81);Other abnormalities of gait and mobility (R26.89);Pain Pain - Right/Left: Right Pain - part of  body: Knee                Time: 1610-9604 OT Time Calculation (min): 16 min Charges:  OT General Charges $OT Visit: 1 Visit OT Evaluation $OT Eval Moderate Complexity: 1 Mod G-Codes:     Dejean Tribby A. Brett Albino, M.S., OTR/L Pager: [phone]  Gaye Alken 12/12/2017, 12:18 PM

## 2017-12-12 NOTE — Progress Notes (Signed)
Physical Therapy Treatment Patient Details Name: Deanna ChampagneGeraldine G Kulish MRN: [id number] DOB: [birth year] Today's Date: 12/12/2017    History of Present Illness Pt is a 82 y/o female s/p elective R TKA. PMH includes asthma, COPD, HTN, osteoporosis, and L TKA.     PT Comments    Patient continues to make progress toward PT goals. Pt tolerated gait training and completed LE HEP. Patient needs to practice stairs next session.      Follow Up Recommendations  Follow surgeon's recommendation for DC plan and follow-up therapies;Supervision for mobility/OOB     Equipment Recommendations  None recommended by PT    Recommendations for Other Services       Precautions / Restrictions Precautions Precautions: Knee Precaution Comments: knee precautions reviewed with pt Required Braces or Orthoses: Knee Immobilizer - Right Knee Immobilizer - Right: Other (comment)(until discontinued ) Restrictions Weight Bearing Restrictions: Yes RLE Weight Bearing: Weight bearing as tolerated    Mobility  Bed Mobility Overal bed mobility: Modified Independent Bed Mobility: Supine to Sit;Sit to Supine           General bed mobility comments: increased time and effort  Transfers Overall transfer level: Needs assistance Equipment used: Rolling walker (2 wheeled) Transfers: Sit to/from Stand Sit to Stand: Min guard         General transfer comment: min guard for safety; cues for safe hand placement  Ambulation/Gait Ambulation/Gait assistance: Min guard Ambulation Distance (Feet): 100 Feet Assistive device: Rolling walker (2 wheeled) Gait Pattern/deviations: Step-through pattern;Decreased weight shift to right;Decreased step length - left;Decreased stance time - right;Antalgic Gait velocity: decreased   General Gait Details: cues for R heel strike and posture; improved step length symmetry   Stairs            Wheelchair Mobility    Modified Rankin (Stroke Patients Only)        Balance Overall balance assessment: Needs assistance Sitting-balance support: No upper extremity supported;Feet supported Sitting balance-Leahy Scale: Good     Standing balance support: Bilateral upper extremity supported;During functional activity Standing balance-Leahy Scale: Poor Standing balance comment: Reliant on RW for stability                             Cognition Arousal/Alertness: Awake/alert Behavior During Therapy: WFL for tasks assessed/performed Overall Cognitive Status: Within Functional Limits for tasks assessed                                        Exercises Total Joint Exercises Quad Sets: AROM;Right;10 reps Short Arc Quad: AROM;AAROM;Right;10 reps Heel Slides: AROM;Right;10 reps Hip ABduction/ADduction: AROM;Right;10 reps Straight Leg Raises: AROM;Right;10 reps Long Arc Quad: AROM;Right;10 reps;Seated Knee Flexion: AROM;Right;5 reps;Seated;Other (comment)(10 sec holds)    General Comments General comments (skin integrity, edema, etc.): SpO2 > 90% on RA during session; husband present; pt with nausea and emesis end of session      Pertinent Vitals/Pain Pain Assessment: Faces Faces Pain Scale: Hurts little more Pain Location: R knee  Pain Descriptors / Indicators: Guarding;Sore Pain Intervention(s): Limited activity within patient's tolerance;Monitored during session;Repositioned    Home Living                      Prior Function            PT Goals (current goals can now be found in the care  plan section) Acute Rehab PT Goals Patient Stated Goal: to go home  PT Goal Formulation: With patient Time For Goal Achievement: 12/25/17 Potential to Achieve Goals: Good Progress towards PT goals: Progressing toward goals    Frequency    7X/week      PT Plan Current plan remains appropriate    Co-evaluation              AM-PAC PT "6 Clicks" Daily Activity  Outcome Measure  Difficulty turning over in  bed (including adjusting bedclothes, sheets and blankets)?: A Little Difficulty moving from lying on back to sitting on the side of the bed? : Unable Difficulty sitting down on and standing up from a chair with arms (e.g., wheelchair, bedside commode, etc,.)?: Unable Help needed moving to and from a bed to chair (including a wheelchair)?: A Little Help needed walking in hospital room?: A Little Help needed climbing 3-5 steps with a railing? : A Little 6 Click Score: 14    End of Session Equipment Utilized During Treatment: Gait belt Activity Tolerance: Patient tolerated treatment well Patient left: with call bell/phone within reach;in bed Nurse Communication: Mobility status PT Visit Diagnosis: Other abnormalities of gait and mobility (R26.89);Pain Pain - Right/Left: Right Pain - part of body: Knee     Time: 1610-9604 PT Time Calculation (min) (ACUTE ONLY): 23 min  Charges:  $Gait Training: 8-22 mins $Therapeutic Exercise: 8-22 mins                    G Codes:       Erline Levine, PTA Pager: [phone]     Carolynne Edouard 12/12/2017, 4:09 PM

## 2017-12-12 NOTE — Progress Notes (Signed)
Physical Therapy Treatment Patient Details Name: Deanna Acosta MRN: [id number] DOB: [birth year] Today's Date: 12/12/2017    History of Present Illness Pt is a 82 y/o female s/p elective R TKA. PMH includes asthma, COPD, HTN, osteoporosis, and L TKA.     PT Comments    Patient is making progress toward mobility goals. Pt limited by nausea during session and vomited end of session. RN notified pt requests nausea medication. Continue to progress as tolerated.    Follow Up Recommendations  Follow surgeon's recommendation for DC plan and follow-up therapies;Supervision for mobility/OOB     Equipment Recommendations  None recommended by PT    Recommendations for Other Services       Precautions / Restrictions Precautions Precautions: Knee Precaution Booklet Issued: No Precaution Comments: knee precautions reviewed with pt Required Braces or Orthoses: Knee Immobilizer - Right Knee Immobilizer - Right: Other (comment)(until discontinued ) Restrictions Weight Bearing Restrictions: Yes RLE Weight Bearing: Weight bearing as tolerated    Mobility  Bed Mobility Overal bed mobility: Needs Assistance Bed Mobility: Supine to Sit     Supine to sit: Min assist     General bed mobility comments: pt OOB in chair upon arrival  Transfers Overall transfer level: Needs assistance Equipment used: Rolling walker (2 wheeled) Transfers: Sit to/from Stand Sit to Stand: Min guard         General transfer comment: min guard for safety; cues for safe hand placement  Ambulation/Gait Ambulation/Gait assistance: Min guard Ambulation Distance (Feet): 100 Feet Assistive device: Rolling walker (2 wheeled) Gait Pattern/deviations: Step-through pattern;Decreased weight shift to right;Decreased step length - left;Decreased stance time - right;Antalgic     General Gait Details: cues for sequencing, R heel strike/toe off, and posture   Stairs            Wheelchair Mobility     Modified Rankin (Stroke Patients Only)       Balance Overall balance assessment: Needs assistance Sitting-balance support: No upper extremity supported;Feet supported Sitting balance-Leahy Scale: Good     Standing balance support: Bilateral upper extremity supported;During functional activity Standing balance-Leahy Scale: Poor                              Cognition Arousal/Alertness: Awake/alert Behavior During Therapy: WFL for tasks assessed/performed Overall Cognitive Status: Within Functional Limits for tasks assessed                                        Exercises      General Comments General comments (skin integrity, edema, etc.): SpO2 > 90% on RA during session; husband present; pt with nausea and emesis end of session      Pertinent Vitals/Pain Pain Assessment: Faces Faces Pain Scale: Hurts little more Pain Location: R knee  Pain Descriptors / Indicators: Guarding;Sore Pain Intervention(s): Limited activity within patient's tolerance;Monitored during session;Premedicated before session;Repositioned;Ice applied    Home Living Family/patient expects to be discharged to:: Private residence Living Arrangements: Spouse/significant other Available Help at Discharge: Family;Available 24 hours/day Type of Home: House Home Access: Stairs to enter   Home Layout: One level Home Equipment: Environmental consultantWalker - 2 wheels;Tub bench;Bedside commode      Prior Function Level of Independence: Independent      Comments: Occasional use of RW    PT Goals (current goals can now be found in the  care plan section) Acute Rehab PT Goals Patient Stated Goal: to go home  PT Goal Formulation: With patient Time For Goal Achievement: 12/25/17 Potential to Achieve Goals: Good Progress towards PT goals: Progressing toward goals    Frequency    7X/week      PT Plan Current plan remains appropriate    Co-evaluation              AM-PAC PT "6  Clicks" Daily Activity  Outcome Measure  Difficulty turning over in bed (including adjusting bedclothes, sheets and blankets)?: A Little Difficulty moving from lying on back to sitting on the side of the bed? : Unable Difficulty sitting down on and standing up from a chair with arms (e.g., wheelchair, bedside commode, etc,.)?: Unable Help needed moving to and from a bed to chair (including a wheelchair)?: A Little Help needed walking in hospital room?: A Little Help needed climbing 3-5 steps with a railing? : A Little 6 Click Score: 14    End of Session Equipment Utilized During Treatment: Gait belt Activity Tolerance: Patient limited by pain;Other (comment)(nausea) Patient left: in chair;with call bell/phone within reach;with family/visitor present Nurse Communication: Mobility status;Other (comment)(pt requests nausea medication) PT Visit Diagnosis: Other abnormalities of gait and mobility (R26.89);Pain Pain - Right/Left: Right Pain - part of body: Knee     Time: 1610-9604 PT Time Calculation (min) (ACUTE ONLY): 17 min  Charges:  $Gait Training: 8-22 mins                    G Codes:       Erline Levine, PTA Pager: [phone]     Carolynne Edouard 12/12/2017, 12:50 PM

## 2017-12-13 ENCOUNTER — Inpatient Hospital Stay (HOSPITAL_COMMUNITY): Payer: Medicare Other

## 2017-12-13 DIAGNOSIS — Z96651 Presence of right artificial knee joint: Secondary | ICD-10-CM

## 2017-12-13 DIAGNOSIS — J690 Pneumonitis due to inhalation of food and vomit: Secondary | ICD-10-CM | POA: Clinically undetermined

## 2017-12-13 DIAGNOSIS — K224 Dyskinesia of esophagus: Secondary | ICD-10-CM | POA: Diagnosis present

## 2017-12-13 LAB — RESPIRATORY PANEL BY PCR

## 2017-12-13 LAB — BASIC METABOLIC PANEL
Anion gap: 11 (ref 5–15)
BUN: 17 mg/dL (ref 6–20)
CO2: 22 mmol/L (ref 22–32)
Calcium: 9 mg/dL (ref 8.9–10.3)
Chloride: 94 mmol/L — ABNORMAL LOW (ref 101–111)
Creatinine, Ser: 1.42 mg/dL — ABNORMAL HIGH (ref 0.44–1.00)
GFR calc Af Amer: 40 mL/min — ABNORMAL LOW (ref >60)
GFR calc non Af Amer: 34 mL/min — ABNORMAL LOW (ref >60)
Glucose, Bld: 102 mg/dL — ABNORMAL HIGH (ref 65–99)
Potassium: 4 mmol/L (ref 3.5–5.1)
Sodium: 127 mmol/L — ABNORMAL LOW (ref 135–145)

## 2017-12-13 LAB — CBC
HCT: 33.9 % — ABNORMAL LOW (ref 36.0–46.0)
Hemoglobin: 11 g/dL — ABNORMAL LOW (ref 12.0–15.0)
MCH: 28.6 pg (ref 26.0–34.0)
MCHC: 32.4 g/dL (ref 30.0–36.0)
MCV: 88.1 fL (ref 78.0–100.0)
Platelets: 298 10*3/uL (ref 150–400)
RBC: 3.85 MIL/uL — ABNORMAL LOW (ref 3.87–5.11)
RDW: 14.7 % (ref 11.5–15.5)
WBC: 14.3 10*3/uL — ABNORMAL HIGH (ref 4.0–10.5)

## 2017-12-13 LAB — STREP PNEUMONIAE URINARY ANTIGEN: Strep Pneumo Urinary Antigen: NEGATIVE

## 2017-12-13 MED ORDER — ALBUTEROL SULFATE (2.5 MG/3ML) 0.083% IN NEBU
2.5000 mg | INHALATION_SOLUTION | RESPIRATORY_TRACT | Status: DC | PRN
Start: 2017-12-13 — End: 2017-12-14

## 2017-12-13 MED ORDER — SODIUM CHLORIDE 0.9 % IV SOLN
2.0000 g | INTRAVENOUS | Status: DC
  Administered 2017-12-13: 2 g via INTRAVENOUS
  Filled 2017-12-13 (×2): qty 2

## 2017-12-13 MED ORDER — VANCOMYCIN HCL IN DEXTROSE 1-5 GM/200ML-% IV SOLN
1000.0000 mg | INTRAVENOUS | Status: DC
  Administered 2017-12-13 – 2017-12-14 (×2): 1000 mg via INTRAVENOUS
  Filled 2017-12-13 (×2): qty 200

## 2017-12-13 MED ORDER — IPRATROPIUM-ALBUTEROL 0.5-2.5 (3) MG/3ML IN SOLN
3.0000 mL | Freq: Four times a day (QID) | RESPIRATORY_TRACT | Status: DC
  Administered 2017-12-13: 3 mL via RESPIRATORY_TRACT
  Filled 2017-12-13 (×2): qty 3

## 2017-12-13 MED ORDER — METOCLOPRAMIDE HCL 5 MG/ML IJ SOLN
5.0000 mg | Freq: Four times a day (QID) | INTRAMUSCULAR | Status: DC
  Administered 2017-12-13 – 2017-12-14 (×4): 5 mg via INTRAVENOUS
  Filled 2017-12-13 (×5): qty 2
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
## 2017-12-13 NOTE — Progress Notes (Signed)
PHARMACY NOTE:  ANTIMICROBIAL RENAL DOSAGE ADJUSTMENT  Current antimicrobial regimen includes a mismatch between antimicrobial dosage and estimated renal function.  As per policy approved by the Pharmacy & Therapeutics and Medical Executive Committees, the antimicrobial dosage will be adjusted accordingly.  Current antimicrobial dosage:  Cefepime 1g q 8h  Indication: HCAP  Renal Function:  Estimated Creatinine Clearance: 34.9 mL/min (A) (by C-G formula based on SCr of 1.39 mg/dL (H)). []      On intermittent HD, scheduled: []      On CRRT    Antimicrobial dosage has been changed to:  Cefepime 2g q 24h  Additional comments: Do not anticipate improvement with CrCl with CKD3 and baseline SCr ~ 1.3-1.4  Deanna PoseyJonathan Emerson Acosta, PharmD Pharmacy Resident Pager #: [phone] 12/13/2017 2:19 PM

## 2017-12-13 NOTE — Progress Notes (Signed)
Dr. Willette PaSheehan called me back to inform me she spoke with the GI doctor on call and they said to make her NPO now and keep her head of bed elevated at 45 degrees. I informed the patient and her husband of the plan.

## 2017-12-13 NOTE — Care Management (Signed)
Patient pre-operatively set up with [facility].  They will follow patient and [facility].  No DME recommended at this time.

## 2017-12-13 NOTE — Progress Notes (Signed)
Orthopedics Progress Note  Subjective: "I am coughing and getting sick I can't go home"  Objective:  Vitals:   12/13/17 0904 12/13/17 0914  BP:    Pulse:    Resp:    Temp:    SpO2: 98% 99%    General: Awake and alert  Musculoskeletal: right knee dressing CDI, dressing changed to Aquacel, no cords and minimal swelling Neurovascularly intact  Lab Results  Component Value Date   WBC 14.3 (H) 12/13/2017   HGB 11.0 (L) 12/13/2017   HCT 33.9 (L) 12/13/2017   MCV 88.1 12/13/2017   PLT 298 12/13/2017       Component Value Date/Time   NA 134 (L) 12/12/2017 0342   K 4.2 12/12/2017 0342   CL 100 (L) 12/12/2017 0342   CO2 23 12/12/2017 0342   GLUCOSE 110 (H) 12/12/2017 0342   BUN 19 12/12/2017 0342   CREATININE 1.39 (H) 12/12/2017 0342   CALCIUM 8.8 (L) 12/12/2017 0342   GFRNONAA 35 (L) 12/12/2017 0342   GFRAA 41 (L) 12/12/2017 0342    Lab Results  Component Value Date   INR 1.48 06/23/2013   INR 1.27 06/22/2013   INR 1.07 06/21/2013    Assessment/Plan: POD #2  s/p Procedure(s): TOTAL RIGHT KNEE ARTHROPLASTY Patient with new onset cough and decreased sats on O2 Increased WBC 14K Will repeat CXR 2view and get BMET Consult [facility] for medical consultation and comanagement Hold discharge till tomorriw  Commercial Metals CompanySteven R. Ranell [name], MD 12/13/2017 10:26 AM

## 2017-12-13 NOTE — Progress Notes (Signed)
Pharmacy Antibiotic Note  Deanna Acosta is a 82 y.o. female admitted on 12/11/2017 with pneumonia.  Pharmacy has been consulted for Vancomycin  dosing.  Plan: Vancomycin 1000 mg IV every 24 hours.  Goal trough 15-20 mcg/mL.  Cefepime 1 gram iv Q 24 hours  Height: 5\' 5"  (165.1 cm) Weight: 183 lb (83 kg) IBW/kg (Calculated) : 57  Temp (24hrs), Avg:98.2 F (36.8 C), Min:98.1 F (36.7 C), Max:98.2 F (36.8 C)  Recent Labs  Lab 12/12/17 0342 12/13/17 0529  WBC 12.2* 14.3*  CREATININE 1.39*  --     Estimated Creatinine Clearance: 34.9 mL/min (A) (by C-G formula based on SCr of 1.39 mg/dL (H)).    Allergies  Allergen Reactions  . Cortisone Other (See Comments)    headaches  . Penicillins Hives and Other (See Comments)    Has patient had a PCN reaction causing immediate rash, facial/tongue/throat swelling, SOB or lightheadedness with hypotension: Yes Has patient had a PCN reaction causing severe rash involving mucus membranes or skin necrosis: No Has patient had a PCN reaction that required hospitalization No Has patient had a PCN reaction occurring within the last 10 years: No If all of the above answers are "NO", then may proceed with Cephalosporin use. 2  . Losartan Nausea And Vomiting    Cramps and severe diarrhea  . Other Nausea And Vomiting and Other (See Comments)    Fluid pills give pt cramps  . Levofloxacin Rash     Thank you for allowing pharmacy to be a part of this patient's care. Okey RegalLisa Valeria Boza, PharmD [phone] 12/13/2017 2:19 PM

## 2017-12-13 NOTE — Progress Notes (Signed)
Physical Therapy Treatment Patient Details Name: Deanna Acosta MRN: [id number] DOB: [birth year] Today's Date: 12/13/2017    History of Present Illness Pt is a 82 y/o female s/p elective R TKA. PMH includes asthma, COPD, HTN, osteoporosis, and L TKA.     PT Comments    Patient declining working with PT on first attempt this morning with patient stating "something is in my lungs, it's hard to breathe." PT returning with patient more motivated to work with PT. Ambulating short distances within the room to restroom with Min Guard needed for transfers and gait. Tolerable to seated ther ex. Will continue to follow acutely to maximize functional mobility prior to d/c.     Follow Up Recommendations  Follow surgeon's recommendation for DC plan and follow-up therapies;Supervision for mobility/OOB     Equipment Recommendations  None recommended by PT    Recommendations for Other Services       Precautions / Restrictions Precautions Precautions: Knee Precaution Comments: knee precautions reviewed with pt Required Braces or Orthoses: Knee Immobilizer - Right Knee Immobilizer - Right: (until discontinued) Restrictions Weight Bearing Restrictions: Yes RLE Weight Bearing: Weight bearing as tolerated    Mobility  Bed Mobility Overal bed mobility: Modified Independent Bed Mobility: Supine to Sit           General bed mobility comments: HOB elevated; increased time and effort  Transfers Overall transfer level: Needs assistance Equipment used: Rolling walker (2 wheeled) Transfers: Sit to/from Stand Sit to Stand: Min guard Stand pivot transfers: Min guard       General transfer comment: for general safety and sequencing; VC to extend R LE when sitting for pain control  Ambulation/Gait Ambulation/Gait assistance: Min guard Ambulation Distance (Feet): (within room) Assistive device: Rolling walker (2 wheeled) Gait Pattern/deviations: Step-to pattern;Decreased stance time -  right;Decreased step length - left;Decreased weight shift to right;Antalgic         Stairs            Wheelchair Mobility    Modified Rankin (Stroke Patients Only)       Balance Overall balance assessment: Needs assistance Sitting-balance support: No upper extremity supported;Feet supported Sitting balance-Leahy Scale: Good     Standing balance support: Bilateral upper extremity supported;During functional activity Standing balance-Leahy Scale: Fair Standing balance comment: Reliant on RW for stability                             Cognition Arousal/Alertness: Awake/alert Behavior During Therapy: WFL for tasks assessed/performed Overall Cognitive Status: Within Functional Limits for tasks assessed                                        Exercises Total Joint Exercises Ankle Circles/Pumps: AROM;Both;15 reps;Seated Heel Slides: AROM;Right;10 reps;Seated Long Arc Quad: AROM;Right;10 reps;Seated Knee Flexion: AROM;Right;10 reps;Seated    General Comments        Pertinent Vitals/Pain Pain Assessment: Faces Faces Pain Scale: Hurts little more Pain Location: R knee  Pain Descriptors / Indicators: Grimacing;Operative site guarding Pain Intervention(s): Limited activity within patient's tolerance;Monitored during session;Repositioned    Home Living                      Prior Function            PT Goals (current goals can now be found in the care plan  section) Acute Rehab PT Goals Patient Stated Goal: to go home  PT Goal Formulation: With patient Time For Goal Achievement: 12/25/17 Potential to Achieve Goals: Good Progress towards PT goals: Progressing toward goals    Frequency    7X/week      PT Plan Current plan remains appropriate    Co-evaluation              AM-PAC PT "6 Clicks" Daily Activity  Outcome Measure  Difficulty turning over in bed (including adjusting bedclothes, sheets and blankets)?: A  Little Difficulty moving from lying on back to sitting on the side of the bed? : A Lot Difficulty sitting down on and standing up from a chair with arms (e.g., wheelchair, bedside commode, etc,.)?: Unable Help needed moving to and from a bed to chair (including a wheelchair)?: A Little Help needed walking in hospital room?: A Little Help needed climbing 3-5 steps with a railing? : A Little 6 Click Score: 15    End of Session Equipment Utilized During Treatment: Gait belt Activity Tolerance: Patient tolerated treatment well Patient left: in chair;with call bell/phone within reach;with family/visitor present Nurse Communication: Mobility status PT Visit Diagnosis: Other abnormalities of gait and mobility (R26.89);Pain Pain - Right/Left: Right Pain - part of body: Knee     Time: 1208-1227 PT Time Calculation (min) (ACUTE ONLY): 19 min  Charges:  $Therapeutic Exercise: 8-22 mins                    G Codes:       Kipp Laurence, PT, DPT 12/13/17 12:36 PM

## 2017-12-13 NOTE — Progress Notes (Addendum)
Occupational Therapy Treatment Patient Details Name: Deanna Acosta MRN: [id number] DOB: 11-30-1935 Today's Date: 12/13/2017    History of present illness Pt is a 82 y/o female s/p elective R TKA. PMH includes asthma, COPD, HTN, osteoporosis, and L TKA.    OT comments  Pt. Initially very reluctant for participation in skilled OT attempting to cancel session.  Reports "ive caught something, im not well I cant breathe good either".  RN present and we encouraged participation in session. Pt. Agreeable to bed mobility and sitting in recliner but declined intended goal of tub transfer.  States "im not walking today I cant".  Will attempt tub transfer at next session as pt. Able.    Follow Up Recommendations  No OT follow up;Supervision/Assistance - 24 hour    Equipment Recommendations  None recommended by OT    Recommendations for Other Services      Precautions / Restrictions Precautions Precautions: Knee Required Braces or Orthoses: Knee Immobilizer - Right Knee Immobilizer - Right: Other (comment)(until discontinued) Restrictions RLE Weight Bearing: Weight bearing as tolerated       Mobility Bed Mobility Overal bed mobility: Modified Independent Bed Mobility: Supine to Sit           General bed mobility comments: hob elevated, pt. able to manage RLE without any physical assistance  Transfers Overall transfer level: Needs assistance Equipment used: Rolling walker (2 wheeled) Transfers: Sit to/from [employer] Sit to Stand: Min guard Stand pivot transfers: Min guard       General transfer comment: cues for hand placement on bed when transitioning into standing not to pull on RW but push from the bed    Balance                                           ADL either performed or assessed with clinical judgement   ADL Overall ADL's : Needs assistance/impaired                     Lower Body Dressing: Maximal  assistance;Sitting/lateral leans Lower Body Dressing Details (indicate cue type and reason): pt. declined donning L sock states "That guy standing right there does it for me" (referring to husband present in the room)           Tub/Shower Transfer Details (indicate cue type and reason): reviewed stated goal/plan for Tub trasnfer pt. declined see below Functional mobility during ADLs: Min guard;Cueing for safety;Rolling walker General ADL Comments: pt. reluctant for participation in skilled OT.  intially declining session but with encouragement from myself and RN who was present she was agreeable to bed mobility, and amb. to recliner.  declined tub transfer and any further mobility.  "ive caught something im not well, its affecting my breathing" RN present and made aware of pt.s reported medical changes.  pt. cont. to refuse participation stating she was nauseas with any mobility and saying "the doctors surely not going to let me go home today dont you think"     Vision       Perception     Praxis      Cognition Arousal/Alertness: Awake/alert Behavior During Therapy: [facility] for tasks assessed/performed Overall Cognitive Status: Within Functional Limits for tasks assessed  Exercises     Shoulder Instructions       General Comments      Pertinent Vitals/ Pain       Pain Assessment: No/denies pain  Home Living                                          Prior Functioning/Environment              Frequency  Min 2X/week        Progress Toward Goals  OT Goals(current goals can now be found in the care plan section)  Progress towards OT goals: Progressing toward goals     Plan      Co-evaluation                 AM-PAC PT "6 Clicks" Daily Activity     Outcome Measure   Help from another person eating meals?: None Help from another person taking care of personal grooming?: None Help  from another person toileting, which includes using toliet, bedpan, or urinal?: A Little Help from another person bathing (including washing, rinsing, drying)?: A Little Help from another person to put on and taking off regular upper body clothing?: None Help from another person to put on and taking off regular lower body clothing?: A Little 6 Click Score: 21    End of Session Equipment Utilized During Treatment: Rolling walker;Oxygen, KI, Gait belt  OT Visit Diagnosis: Unsteadiness on feet (R26.81);Other abnormalities of gait and mobility (R26.89);Pain Pain - Right/Left: Right Pain - part of body: Knee   Activity Tolerance Patient tolerated treatment well   Patient Left in chair;with call bell/phone within reach;with nursing/sitter in room;with family/visitor present   Nurse Communication Other (comment)(pt. reporting shes "caught something" referring to a cough and breathing difficulty)        Time: 8119-14780923-0934 OT Time Calculation (min): 11 min  Charges: OT General Charges $OT Visit: 1 Visit OT Treatments $Self Care/Home Management : 8-22 mins  Robet LeuMorris, Trannie Bardales Lorraine, COTA/L 12/13/2017, 9:44 AM

## 2017-12-13 NOTE — Progress Notes (Addendum)
CT scan shows a patulous esophagus with retained food in the esophagus and no pneumonia.  Based on patient's physical exam she at least has bronchitis.  To continue her antibiotics.  I have consulted Dr. Liborio NixonJanice from [facility].  Patient sees Dr. Marina GoodellPerry as an outpatient.  Also started metoclopramide 5 mg IV every 6 hours for 10 doses.   @@@Addendum @@@ I spoke with Dr. Lamar SprinklesPerry's partner on-call.  Tomorrow Dr. Marina GoodellPerry will be on call and will see the patient.  He request that we make the patient n.p.o. we keep her head of bed elevated 45 degrees.

## 2017-12-13 NOTE — Progress Notes (Signed)
Dr. Willette PaSheehan called to inform me of the plan of care for the patient after the CT scan resulted. Due to it showing patulous esophagus with retained food she wanted to start her on reglan IV scheduled every 6 hours. She informed me that she was going to consult [facility] because Dr. Marina GoodellPerry has seen this patient in the past.

## 2017-12-14 ENCOUNTER — Inpatient Hospital Stay (HOSPITAL_COMMUNITY): Payer: Medicare Other | Admitting: Anesthesiology

## 2017-12-14 ENCOUNTER — Encounter (HOSPITAL_COMMUNITY)
Admission: RE | Disposition: A | Discharge status: Home Health | Payer: Self-pay | Source: Ambulatory Visit | Attending: Orthopedic Surgery

## 2017-12-14 ENCOUNTER — Encounter (HOSPITAL_COMMUNITY): Payer: Self-pay | Admitting: Anesthesiology

## 2017-12-14 DIAGNOSIS — K224 Dyskinesia of esophagus: Secondary | ICD-10-CM

## 2017-12-14 DIAGNOSIS — R1310 Dysphagia, unspecified: Secondary | ICD-10-CM

## 2017-12-14 DIAGNOSIS — E871 Hypo-osmolality and hyponatremia: Secondary | ICD-10-CM

## 2017-12-14 DIAGNOSIS — D649 Anemia, unspecified: Secondary | ICD-10-CM

## 2017-12-14 DIAGNOSIS — K2100 Gastro-esophageal reflux disease with esophagitis, without bleeding: Secondary | ICD-10-CM

## 2017-12-14 DIAGNOSIS — J690 Pneumonitis due to inhalation of food and vomit: Secondary | ICD-10-CM

## 2017-12-14 DIAGNOSIS — T18108A Unspecified foreign body in esophagus causing other injury, initial encounter: Secondary | ICD-10-CM

## 2017-12-14 DIAGNOSIS — K449 Diaphragmatic hernia without obstruction or gangrene: Secondary | ICD-10-CM

## 2017-12-14 DIAGNOSIS — N183 Chronic kidney disease, stage 3 (moderate): Secondary | ICD-10-CM

## 2017-12-14 HISTORY — PX: FOREIGN BODY REMOVAL: SHX962

## 2017-12-14 HISTORY — PX: ESOPHAGOGASTRODUODENOSCOPY: SHX5428

## 2017-12-14 LAB — IRON AND TIBC
Iron: 12 ug/dL — ABNORMAL LOW (ref 28–170)
Saturation Ratios: 5 % — ABNORMAL LOW (ref 10.4–31.8)
TIBC: 242 ug/dL — ABNORMAL LOW (ref 250–450)
UIBC: 230 ug/dL

## 2017-12-14 LAB — HIV ANTIBODY (ROUTINE TESTING W REFLEX): HIV Screen 4th Generation wRfx: NONREACTIVE

## 2017-12-14 LAB — FOLATE: Folate: 12.3 ng/mL (ref >5.9)

## 2017-12-14 LAB — RETICULOCYTES
RBC.: 3.64 MIL/uL — ABNORMAL LOW (ref 3.87–5.11)
Retic Count, Absolute: 69.2 10*3/uL (ref 19.0–186.0)
Retic Ct Pct: 1.9 % (ref 0.4–3.1)

## 2017-12-14 LAB — CBC
HCT: 32.7 % — ABNORMAL LOW (ref 36.0–46.0)
Hemoglobin: 10.4 g/dL — ABNORMAL LOW (ref 12.0–15.0)
MCH: 28 pg (ref 26.0–34.0)
MCHC: 31.8 g/dL (ref 30.0–36.0)
MCV: 88.1 fL (ref 78.0–100.0)
Platelets: 299 10*3/uL (ref 150–400)
RBC: 3.71 MIL/uL — ABNORMAL LOW (ref 3.87–5.11)
RDW: 14.6 % (ref 11.5–15.5)
WBC: 14.3 10*3/uL — ABNORMAL HIGH (ref 4.0–10.5)

## 2017-12-14 LAB — VITAMIN B12: Vitamin B-12: 178 pg/mL — ABNORMAL LOW (ref 180–914)

## 2017-12-14 LAB — PROCALCITONIN: Procalcitonin: 0.45 ng/mL

## 2017-12-14 LAB — FERRITIN: Ferritin: 125 ng/mL (ref 11–307)

## 2017-12-14 SURGERY — EGD (ESOPHAGOGASTRODUODENOSCOPY)
Anesthesia: Monitor Anesthesia Care

## 2017-12-14 MED ORDER — SULFAMETHOXAZOLE-TRIMETHOPRIM 800-160 MG PO TABS: 1.0000 | ORAL_TABLET | Freq: Two times a day (BID) | ORAL | 0 refills | Status: DC

## 2017-12-14 MED ORDER — IPRATROPIUM-ALBUTEROL 0.5-2.5 (3) MG/3ML IN SOLN
3.0000 mL | Freq: Three times a day (TID) | RESPIRATORY_TRACT | Status: DC
  Administered 2017-12-14 (×2): 3 mL via RESPIRATORY_TRACT
  Filled 2017-12-14 (×2): qty 3

## 2017-12-14 MED ORDER — PROPOFOL 500 MG/50ML IV EMUL
INTRAVENOUS | Status: DC | PRN
  Administered 2017-12-14: 75 ug/kg/min via INTRAVENOUS

## 2017-12-14 MED ORDER — LEVOFLOXACIN 500 MG PO TABS: 500.0000 mg | ORAL_TABLET | Freq: Every day | ORAL | 0 refills | Status: AC

## 2017-12-14 MED ORDER — LACTATED RINGERS IV SOLN
INTRAVENOUS | Status: DC | PRN
  Administered 2017-12-14: 13:00:00 via INTRAVENOUS

## 2017-12-14 MED ORDER — LIDOCAINE HCL (CARDIAC) 20 MG/ML IV SOLN
INTRAVENOUS | Status: DC | PRN
  Administered 2017-12-14: 30 mg via INTRAVENOUS

## 2017-12-14 MED ORDER — SODIUM CHLORIDE 0.9 % IV SOLN
INTRAVENOUS | Status: DC
  Administered 2017-12-14: 11:00:00 via INTRAVENOUS

## 2017-12-14 MED ORDER — ASPIRIN 81 MG PO CHEW: 81.0000 mg | CHEWABLE_TABLET | Freq: Two times a day (BID) | ORAL | 0 refills | Status: DC

## 2017-12-14 NOTE — Progress Notes (Signed)
Orthopedics Progress Note  Subjective: Patient stable overnight, npo now awaiting upper endoscopy per Dr Marina GoodellPerry.  Objective:  Vitals:   12/14/17 0419 12/14/17 0825  BP: (!) 105/55   Pulse: 97   Resp: 16   Temp: 98.3 F (36.8 C)   SpO2: 93% 94%    General: Awake and alert  Musculoskeletal: knee dressing intact, mod swelling Neurovascularly intact  Lab Results  Component Value Date   WBC 14.3 (H) 12/14/2017   HGB 10.4 (L) 12/14/2017   HCT 32.7 (L) 12/14/2017   MCV 88.1 12/14/2017   PLT 299 12/14/2017       Component Value Date/Time   NA 127 (L) 12/13/2017 1122   K 4.0 12/13/2017 1122   CL 94 (L) 12/13/2017 1122   CO2 22 12/13/2017 1122   GLUCOSE 102 (H) 12/13/2017 1122   BUN 17 12/13/2017 1122   CREATININE 1.42 (H) 12/13/2017 1122   CALCIUM 9.0 12/13/2017 1122   GFRNONAA 34 (L) 12/13/2017 1122   GFRAA 40 (L) 12/13/2017 1122    Lab Results  Component Value Date   INR 1.48 06/23/2013   INR 1.27 06/22/2013   INR 1.07 06/21/2013    Assessment/Plan: POD #1 s/p Procedure(s): TOTAL RIGHT KNEE ARTHROPLASTY Hyponatremia - free water restriction and recommendations per [facility] - NS while NPO Hgb stable post-op Await upper EGD today - maintain NPO  Almedia BallsSteven R. Ranell [name], MD 12/14/2017 11:06 AM

## 2017-12-14 NOTE — Progress Notes (Signed)
Orthopedics Progress Note  Subjective: Patient feeling better, stable for discharge home  Objective:  Vitals:   12/14/17 1305 12/14/17 1320  BP: (!) 100/47 (!) 115/46  Pulse: 95 (!) 101  Resp: (!) 26 (!) 21  Temp: 98.2 F (36.8 C)   SpO2: 94% 91%    General: Awake and alert  Musculoskeletal: Good active ROM of the right leg, no cords and no swelling Neurovascularly intact  Lab Results  Component Value Date   WBC 14.3 (H) 12/14/2017   HGB 10.4 (L) 12/14/2017   HCT 32.7 (L) 12/14/2017   MCV 88.1 12/14/2017   PLT 299 12/14/2017       Component Value Date/Time   NA 127 (L) 12/13/2017 1122   K 4.0 12/13/2017 1122   CL 94 (L) 12/13/2017 1122   CO2 22 12/13/2017 1122   GLUCOSE 102 (H) 12/13/2017 1122   BUN 17 12/13/2017 1122   CREATININE 1.42 (H) 12/13/2017 1122   CALCIUM 9.0 12/13/2017 1122   GFRNONAA 34 (L) 12/13/2017 1122   GFRAA 40 (L) 12/13/2017 1122    Lab Results  Component Value Date   INR 1.48 06/23/2013   INR 1.27 06/22/2013   INR 1.07 06/21/2013    Assessment/Plan: POD #3 s/p Procedure(s): TKR right ESOPHAGOGASTRODUODENOSCOPY (EGD) FOREIGN BODY REMOVAL Discharge home  Almedia BallsSteven R. Ranell [name], MD 12/14/2017 2:24 PM

## 2017-12-14 NOTE — Discharge Instructions (Signed)
Esophagogastroduodenoscopy, Care After Refer to this sheet in the next few weeks. These instructions provide you with information about caring for yourself after your procedure. Your health care provider may also give you more specific instructions. Your treatment has been planned according to current medical practices, but problems sometimes occur. Call your health care provider if you have any problems or questions after your procedure. What can I expect after the procedure? After the procedure, it is common to have:  A sore throat.  Nausea.  Bloating.  Dizziness.  Fatigue.  Follow these instructions at home:  Do not eat or drink anything until the numbing medicine (local anesthetic) has worn off and your gag reflex has returned. You will know that the local anesthetic has worn off when you can swallow comfortably.  Do not drive for 24 hours if you received a medicine to help you relax (sedative).  If your health care provider took a tissue sample for testing during the procedure, make sure to get your test results. This is your responsibility. Ask your health care provider or the department performing the test when your results will be ready.  Keep all follow-up visits as told by your health care provider. This is important. Contact a health care provider if:  You cannot stop coughing.  You are not urinating.  You are urinating less than usual. Get help right away if:  You have trouble swallowing.  You cannot eat or drink.  You have throat or chest pain that gets worse.  You are dizzy or light-headed.  You faint.  You have nausea or vomiting.  You have chills.  You have a fever.  You have severe abdominal pain.  You have black, tarry, or bloody stools. This information is not intended to replace advice given to you by your health care provider. Make sure you discuss any questions you have with your health care provider. Document Released: 09/08/2012 Document  Revised: 02/28/2016 Document Reviewed: 08/16/2015 Elsevier Interactive Patient Education  2018 [employer]  DR Jowanna Loeffler    TOTAL KNEE INSTRUCTIONS  Ice to the knee as much as possible.  Keep the incision covered and clean and dry for one week, then ok to get it wet in the shower.  Do exercises as instructed, use the knee brace at night to keep the knee straight while asleep.  Ok to put full weight on the right leg.  Follow up in two weeks in the office. Call [phone] for appt  If breathing not better in a week, need to see primary care MD.  If breathing worsens seek medical help immediately  Dr Ranell [name]

## 2017-12-14 NOTE — Progress Notes (Signed)
One more dose of IV Vanc given prior to discharge. Discharge instructions reviewed with patient and husband. Patient verbalized understanding. IV removed, patient dressed with instructions and prescriptions waiting for wheelchair.

## 2017-12-14 NOTE — Anesthesia Procedure Notes (Signed)
Procedure Name: MAC Date/Time: 12/14/2017 12:51 PM Performed by: Eligha Bridegroom, CRNA Pre-anesthesia Checklist: Patient identified, Emergency Drugs available, Suction available, Patient being monitored and Timeout performed Patient Re-evaluated:Patient Re-evaluated prior to induction Oxygen Delivery Method: Nasal cannula Preoxygenation: Pre-oxygenation with 100% oxygen Induction Type: IV induction

## 2017-12-14 NOTE — Op Note (Signed)
[facility] Patient Name: Deanna Acosta Procedure Date : 12/14/2017 MRN: [id number] Attending MD: Wilhemina Bonito. Marina Goodell , MD Date of Birth: October 02, 1936 CSN: [id number] Age: 82 Admit Type: Inpatient Procedure:                Upper GI endoscopy, with esophageal food removal Indications:              Dysphagia. No definite esophageal dysmotility. Last                            EGD in February 2018 Providers:                Wilhemina Bonito. Marina Goodell, MD, Leandrew Koyanagi, RN, Margo Aye, Technician, Gwenyth Allegra CRNA, CRNA Referring MD:             [facility] Medicines:                Monitored Anesthesia Care Complications:            No immediate complications. Estimated Blood Loss:     Estimated blood loss: none. Procedure:                Pre-Anesthesia Assessment:                           - Prior to the procedure, a History and Physical                            was performed, and patient medications and                            allergies were reviewed. The patient's tolerance of                            previous anesthesia was also reviewed. The risks                            and benefits of the procedure and the sedation                            options and risks were discussed with the patient.                            All questions were answered, and informed consent                            was obtained. Prior Anticoagulants: The patient has                            taken no previous anticoagulant or antiplatelet                            agents. ASA Grade Assessment: III - A patient with  severe systemic disease. After reviewing the risks                            and benefits, the patient was deemed in                            satisfactory condition to undergo the procedure.                           After obtaining informed consent, the endoscope was                            passed under direct  vision. Throughout the                            procedure, the patient's blood pressure, pulse, and                            oxygen saturations were monitored continuously. The                            EG-[id number] ([id number]) scope was introduced through the                            mouth, and advanced to the second part of duodenum.                            The upper GI endoscopy was accomplished without                            difficulty. The patient tolerated the procedure                            well. Scope In: Scope Out: Findings:      The esophagus was dilated with minor amounts of food stuff in the distal       most portion as well as mild esophagitis. No stricture or mechanical       obstruction. The food debris was advanced into the stomach.      The stomach revealed a moderate hiatal hernia with food stuff and pills       in the sac. Sensation of minor obstruction without tortion. The food and       pill debris was advanced into the stomach. The stomach was otherwise       normal.      The examined duodenum was normal.      The cardia and gastric fundus were normal on retroflexion, save for       hernia. Impression:               1. Esophageal dysmotility without obstruction.                            Foodstuff advanced into the stomach endoscopically                           2.  Hiatal hernia                           3. Otherwise normal EGD Moderate Sedation:      none Recommendation:           1. Soft diet                           2. Eat Small portions at a time                           3. Continue PPI daily indefinitely                           4. Set up at all times while awaiting                           5.Do not eat within 4 hours of bedtime                           6. Return to the care of primary service. GI                            follow-up as needed                           Discussed with patient and husband. Procedure                             report provider. Procedure Code(s):        --- Professional ---                           [id number], Esophagogastroduodenoscopy, flexible,                            transoral; diagnostic, including collection of                            specimen(s) by brushing or washing, when performed                            (separate procedure) Diagnosis Code(s):        --- Professional ---                           R13.10, Dysphagia, unspecified CPT copyright 2016 American Medical Association. All rights reserved. The codes documented in this report are preliminary and upon coder review may  be revised to meet current compliance requirements. Wilhemina BonitoJohn N. Marina GoodellPerry, MD 12/14/2017 1:14:20 PM This report has been signed electronically. Number of Addenda: 0

## 2017-12-14 NOTE — Progress Notes (Signed)
Physical Therapy Cancellation Note   12/14/17 1338  PT Visit Information  Last PT Received On 12/14/17  Reason Eval/Treat Not Completed Patient at procedure or test/unavailable; pt off unit for endoscopy. PT will continue to follow acutely.    Erline LevineKellyn Clennon Nasca, PTA Pager: [phone]

## 2017-12-14 NOTE — Progress Notes (Signed)
OT Cancellation Note  Patient Details Name: Loralie ChampagneGeraldine G Glatt MRN: [id number] DOB: [birth year]   Cancelled Treatment:    Reason Eval/Treat Not Completed: Patient declined, no reason specified(requesting OT try later- pt finishing PT with PTA in room )  Harolyn RutherfordJones, Lynden Carrithers B  [phone] 12/14/2017, 11:23 AM

## 2017-12-14 NOTE — Transfer of Care (Signed)
Immediate Anesthesia Transfer of Care Note  Patient: Deanna Acosta  Procedure(s) Performed: ESOPHAGOGASTRODUODENOSCOPY (EGD) (N/A ) FOREIGN BODY REMOVAL (N/A )  Patient Location: Endoscopy Unit  Anesthesia Type:MAC  Level of Consciousness: awake and alert   Airway & Oxygen Therapy: Patient Spontanous Breathing and Patient connected to nasal cannula oxygen  Post-op Assessment: Report given to RN and Post -op Vital signs reviewed and stable  Post vital signs: Reviewed and stable  Last Vitals:  Vitals:   12/14/17 1220 12/14/17 1305  BP: (!) 133/49 (!) 100/47  Pulse: 100 95  Resp: 13 (!) 26  Temp:    SpO2: 96% 94%    Last Pain:  Vitals:   12/14/17 0600  TempSrc:   PainSc: 5       Patients Stated Pain Goal: 3 (53/64/68 0321)  Complications: No apparent anesthesia complications

## 2017-12-14 NOTE — Progress Notes (Signed)
Follow-up: Hospitalist consult for respiratory issues following the surgery. Patient found to have aspiration, history of esophageal dysmotility which likely cause aspiration. Started on IV antibiotics. Breathing stabilized and by hospital day 2 improved. Spring by speech therapy who found no evidence of oropharyngeal dysphagia. GI consulted and took patient for EGD noting food and pill bolus in lower esophagus which was able to be pushed through the stomach.  Aspiration pneumonia: Secondary to esophageal dysmotility: Status post EGD.  1, Aspiration Pneumonia causing acute respiratory failure with hypoxia: Needs 6 more days of Levaquin.  Although reported allergy to fluoroquinolones, had pharmacy review previous history an patient given this medication back in November. Okay to crush pills given swallowing issues.  2: esophageal dysmotility issues instructions given to patient. She will follow up with GI.

## 2017-12-14 NOTE — Progress Notes (Signed)
Physical Therapy Treatment Note  Patient continues to progress toward PT goals and tolerated gait and stair training this session. Pt c/o feeling fatigued due to not being able to eat however agreeable to participate in therapy and eager to progress mobility. Continue to progress as tolerated. Current plan remains appropriate.   12/14/17 1340  PT Visit Information  Last PT Received On 12/14/17  Assistance Needed +1  History of Present Illness Pt is a 82 y/o female s/p elective R TKA. PMH includes asthma, COPD, HTN, osteoporosis, and L TKA.   Subjective Data  Patient Stated Goal to go home   Precautions  Precautions Knee  Precaution Comments knee precautions reviewed with pt  Required Braces or Orthoses Knee Immobilizer - Right  Knee Immobilizer - Right Other (comment) (until discontinued )  Restrictions  Weight Bearing Restrictions Yes  RLE Weight Bearing WBAT  Pain Assessment  Pain Assessment Faces  Faces Pain Scale 4  Pain Location R knee   Pain Descriptors / Indicators Sore  Pain Intervention(s) Limited activity within patient's tolerance;Monitored during session;Repositioned;Patient requesting pain meds-RN notified  Cognition  Arousal/Alertness Awake/alert  Behavior During Therapy WFL for tasks assessed/performed  Overall Cognitive Status Within Functional Limits for tasks assessed  Bed Mobility  Overal bed mobility Modified Independent  Bed Mobility Supine to Sit  General bed mobility comments increased time and effort  Transfers  Overall transfer level Needs assistance  Equipment used Rolling walker (2 wheeled)  Transfers Sit to/from Stand  Sit to Stand Min guard  General transfer comment min guard for safety from EOB and recliner  Ambulation/Gait  Ambulation/Gait assistance Min guard  Ambulation Distance (Feet) 60 Feet  Assistive device Rolling walker (2 wheeled)  Gait Pattern/deviations Step-through pattern;Decreased step length - left;Decreased stance time - right   General Gait Details cues for posture and step length symmetry  Gait velocity decreased  Stairs Yes  Stairs assistance Min guard  Stair Management One rail Right;Step to pattern;Sideways  Number of Stairs (2 steps X2)  General stair comments cues for sequencing and technique; min guard for safety  Balance  Overall balance assessment Needs assistance  Sitting-balance support No upper extremity supported;Feet supported  Sitting balance-Leahy Scale Good  Standing balance support Bilateral upper extremity supported;During functional activity  Standing balance-Leahy Scale Poor  PT - End of Session  Equipment Utilized During Treatment Gait belt;Oxygen  Activity Tolerance Patient tolerated treatment well  Patient left with call bell/phone within reach;in chair;with family/visitor present  Nurse Communication Mobility status  PT - Assessment/Plan  PT Plan Current plan remains appropriate  PT Visit Diagnosis Other abnormalities of gait and mobility (R26.89);Pain  Pain - Right/Left Right  Pain - part of body Knee  PT Frequency (ACUTE ONLY) 7X/week  Follow Up Recommendations Follow surgeon's recommendation for DC plan and follow-up therapies;Supervision for mobility/OOB  PT equipment None recommended by PT  AM-PAC PT "6 Clicks" Daily Activity Outcome Measure  Difficulty turning over in bed (including adjusting bedclothes, sheets and blankets)? 3  Difficulty moving from lying on back to sitting on the side of the bed?  3  Difficulty sitting down on and standing up from a chair with arms (e.g., wheelchair, bedside commode, etc,.)? 1  Help needed moving to and from a bed to chair (including a wheelchair)? 3  Help needed walking in hospital room? 3  Help needed climbing 3-5 steps with a railing?  3  6 Click Score 16  Mobility G Code  CK  PT Goal Progression  Progress  towards PT goals Progressing toward goals  Acute Rehab PT Goals  PT Goal Formulation With patient  Time For Goal Achievement  12/25/17  Potential to Achieve Goals Good  PT Time Calculation  PT Start Time (ACUTE ONLY) 1012  PT Stop Time (ACUTE ONLY) 1043  PT Time Calculation (min) (ACUTE ONLY) 31 min  PT General Charges  $$ ACUTE PT VISIT 1 Visit  PT Treatments  $Gait Training 23-37 mins   Erline LevineKellyn Jassmin Kemmerer, PTA Pager: [phone]
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
## 2017-12-14 NOTE — Progress Notes (Signed)
OT Cancellation Note  Patient Details Name: Deanna Acosta MRN: [id number] DOB: 1936-04-01   Cancelled Treatment:    Reason Eval/Treat Not Completed: Patient at procedure or test/ unavailable. Ot second attempt and patient is off the unit for testing at this time.  Harolyn RutherfordJones, Indiya Izquierdo B  [phone] 12/14/2017, 1:54 PM

## 2017-12-14 NOTE — Op Note (Signed)
Deanna Acosta:  Deanna Acosta            ACCOUNT NO.:  [id number]  MEDICAL RECORD NO.:  [id number]  LOCATION:                                 FACILITY:  PHYSICIAN:  Almedia BallsSteven R. Ranell [name], M.D. DATE OF BIRTH:  October 22, 1935  DATE OF PROCEDURE:  12/11/2017 DATE OF DISCHARGE:                              OPERATIVE REPORT   PREOPERATIVE DIAGNOSIS:  Right knee end-stage osteoarthritis.  POSTOPERATIVE DIAGNOSIS:  Right knee end-stage osteoarthritis.  PROCEDURE PERFORMED:  Right total knee arthroplasty using DePuy Sigma rotating platform prosthesis.  ATTENDING SURGEON:  Almedia BallsSteven R. Ranell [name], M.D.  ASSISTANT:  Donnie Coffinhomas B. Dixon, PA-C, who was scrubbed during the entire procedure and necessary for satisfactory completion of surgery.  ANESTHESIA:  Spinal anesthesia was used plus an adductor canal block.  ESTIMATED BLOOD LOSS:  Minimal.  FLUID REPLACEMENT:  1500 mL crystalloid.  INSTRUMENT COUNTS:  Correct.  COMPLICATIONS:  There were no complications.  Perioperative antibiotics were given.  INDICATIONS:  The patient is a 82 year old female, who presents with worsening right knee pain secondary to end-stage arthritis.  The patient has had progressive pain despite conservative management and desires operative treatment to restore function and eliminate pain.  Informed consent obtained.  DESCRIPTION OF PROCEDURE:  After an adequate level of anesthesia was achieved, the patient was positioned in supine position.  Right leg was correctly identified, and a nonsterile tourniquet placed on the proximal thigh.  The left leg was padded appropriately and secured to the table. After sterile prep and drape of the right leg and knee, a time-out was called verifying correct patient and correct site.  We then elevated the leg, exsanguinated the limb using an Esmarch bandage, and elevated the tourniquet to 325 mmHg.  We placed the knee in flexion.  A longitudinal midline incision was created with a #10 blade  scalpel, dissection sharply down through the subcutaneous tissues.  A fresh #10 blade was used for the medial parapatellar arthrotomy.  Lateral patellofemoral ligaments divided.  Patella everted.  Distal femur entered with a step- cut drill.  We placed intramedullary resection guide and resected 11 mm of bone off the affected right distal femur set on 5 degrees right.  The patient had bone-on-bone arthritis and areas of full-thickness cartilage loss with remodeling of the bone.  Once we had our distal femur cut, we sized our femur to size 4 anterior down and then performed our anterior, posterior, and chamfer cuts off the 4-in-1 block.  We then resected ACL, PCL, and meniscal tissue; subluxed the tibia anteriorly; performed our proximal tibial cut 90 degrees perpendicular to long axis of the tibia using an external alignment jig with minimal posterior slope for this posterior cruciate substituting prosthesis.  Once we had the proximal tibia cut, we used a laminar spreader and removed excess posterior bone off the posterior femoral condyles with a three-quarter inch curved osteotome.  We irrigated thoroughly.  We evacuated the fluid out of the Baker cyst posteriorly.  Next, when we completed our tibial preparation, we checked our gaps, which were symmetric at 12.5 mm.  We then completed our tibial preparation with a modular keel punch and drill.  Then, we completed our femoral preparation and we sized  the tibia to a size 3. We then did a femoral preparation.  We did a box cut with the box cut guide with the oscillating saw and then placed our posterior cruciate substituting size 4 narrow right femur in place and impacted that.  We reduced the knee with a +12 poly, felt like we could probably get a +15 in there.  We irrigated and then did our patellar resurfacing.  We went from a 22 mm thickness down to a 16 mm thickness and used the patellar clamp for the resection on the backside of the  patella.  We then drilled lug holes for the 35 patella.  We placed that on and ranged the knee. We had nice patellar tracking with no-touch technique.  We removed all trial components, vacuum mixed high viscosity cement on the back table, and then cemented the components into place, 3 tibia right, 4 right narrow femur, and then used the 12.5 poly for the trial and then did the patellar clamp for the 35 patellar button.  Once the cement hardened, we removed excess cement with quarter-inch curved osteotome.  We looked in the back of the knee and around the lateral gutter and made sure we had everything free and clear and there was no loose debris or pieces of cement.  Then, we selected a 15 trial and we were happy with that and then did the 4, 15 poly and inserted that on the tibia after thorough irrigation, then reduced the knee, had a nice little pop there medially as it came into reduction.  Knee was stable both in flexion and extension, excellent range of motion, and normal tracking of the patella.  We then irrigated thoroughly, closed the parapatellar arthrotomy with #1 Vicryl suture interrupted followed by 2-0 Vicryl for subcutaneous closure and 4-0 Monocryl for skin.  Steri-Strips were applied followed by a sterile dressing.  The patient tolerated the surgery well.     Almedia Balls. Ranell Patrick, M.D.     SRN/MEDQ  D:  12/11/2017  T:  12/12/2017  Job:  [id number]

## 2017-12-14 NOTE — Anesthesia Preprocedure Evaluation (Signed)
Anesthesia Evaluation  Patient identified by MRN, date of birth, ID band Patient awake    Reviewed: Allergy & Precautions, H&P , Patient's Chart, lab work & pertinent test results, reviewed documented beta blocker date and time   Airway Mallampati: II  TM Distance: >3 FB Neck ROM: full    Dental no notable dental hx.    Pulmonary asthma , former smoker,    Pulmonary exam normal breath sounds clear to auscultation       Cardiovascular hypertension,  Rhythm:regular Rate:Normal     Neuro/Psych    GI/Hepatic   Endo/Other    Renal/GU      Musculoskeletal   Abdominal   Peds  Hematology   Anesthesia Other Findings   Reproductive/Obstetrics                             Anesthesia Physical Anesthesia Plan  ASA: III  Anesthesia Plan: MAC   Post-op Pain Management:    Induction: Intravenous  PONV Risk Score and Plan: 2 and Treatment may vary due to age or medical condition  Airway Management Planned: Mask and Natural Airway  Additional Equipment:   Intra-op Plan:   Post-operative Plan:   Informed Consent: I have reviewed the patients History and Physical, chart, labs and discussed the procedure including the risks, benefits and alternatives for the proposed anesthesia with the patient or authorized representative who has indicated his/her understanding and acceptance.   Dental Advisory Given  Plan Discussed with: CRNA and Surgeon  Anesthesia Plan Comments:         Anesthesia Quick Evaluation
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
## 2017-12-14 NOTE — Progress Notes (Signed)
Physical Therapy Treatment Patient Details Name: Deanna Acosta MRN: [id number] DOB: [birth year] Today's Date: 12/14/2017    History of Present Illness Pt is a 82 y/o female s/p elective R TKA. PMH includes asthma, COPD, HTN, osteoporosis, and L TKA.     PT Comments    Patient is making good progress with PT.  From a mobility standpoint anticipate patient will be ready for DC home when medically ready.    Follow Up Recommendations  Follow surgeon's recommendation for DC plan and follow-up therapies;Supervision for mobility/OOB     Equipment Recommendations  None recommended by PT    Recommendations for Other Services       Precautions / Restrictions Precautions Precautions: Knee Precaution Comments: knee precautions reviewed with pt Required Braces or Orthoses: Knee Immobilizer - Right Knee Immobilizer - Right: Other (comment)(until discontinued ) Restrictions Weight Bearing Restrictions: Yes RLE Weight Bearing: Weight bearing as tolerated    Mobility  Bed Mobility Overal bed mobility: Modified Independent;Needs Assistance Bed Mobility: Supine to Sit;Sit to Supine       Sit to supine: Min assist   General bed mobility comments: assist to bring R LE into bed; husband assisted  Transfers Overall transfer level: Needs assistance Equipment used: Rolling walker (2 wheeled) Transfers: Sit to/from Stand Sit to Stand: Min guard         General transfer comment: min guard for safety from EOB and recliner  Ambulation/Gait Ambulation/Gait assistance: Min guard Ambulation Distance (Feet): 60 Feet Assistive device: Rolling walker (2 wheeled) Gait Pattern/deviations: Step-through pattern;Decreased step length - left;Decreased stance time - right Gait velocity: decreased   General Gait Details: cues for posture and step length symmetry   Stairs Stairs: Yes   Stair Management: One rail Right;Step to pattern;Sideways Number of Stairs: (2 steps X2) General stair  comments: cues for sequencing and technique; min guard for safety  Wheelchair Mobility    Modified Rankin (Stroke Patients Only)       Balance Overall balance assessment: Needs assistance Sitting-balance support: No upper extremity supported;Feet supported Sitting balance-Leahy Scale: Good     Standing balance support: Bilateral upper extremity supported;During functional activity Standing balance-Leahy Scale: Poor                              Cognition Arousal/Alertness: Awake/alert Behavior During Therapy: WFL for tasks assessed/performed Overall Cognitive Status: Within Functional Limits for tasks assessed                                        Exercises Total Joint Exercises Quad Sets: AROM;Both;10 reps Short Arc Quad: AROM;Right;10 reps Heel Slides: AROM;AAROM;Right;10 reps Hip ABduction/ADduction: AROM;Right;10 reps Straight Leg Raises: AROM;Right;10 reps Long Arc Quad: AROM;Right;10 reps Knee Flexion: AROM;Right;10 reps;Seated;Other (comment)(10 sec holds) Goniometric ROM: approx 5-90    General Comments        Pertinent Vitals/Acosta Acosta Assessment: Faces Faces Acosta Scale: Hurts little more Acosta Location: R knee  Acosta Descriptors / Indicators: Sore Acosta Intervention(s): Limited activity within patient's tolerance;Monitored during session;Repositioned    Home Living                      Prior Function            PT Goals (current goals can now be found in the care plan section) Acute Rehab PT Goals Patient  Stated Goal: to go home  PT Goal Formulation: With patient Time For Goal Achievement: 12/25/17 Potential to Achieve Goals: Good Progress towards PT goals: Progressing toward goals    Frequency    7X/week      PT Plan Current plan remains appropriate    Co-evaluation              AM-PAC PT "6 Clicks" Daily Activity  Outcome Measure  Difficulty turning over in bed (including adjusting  bedclothes, sheets and blankets)?: A Little Difficulty moving from lying on back to sitting on the side of the bed? : A Little Difficulty sitting down on and standing up from a chair with arms (e.g., wheelchair, bedside commode, etc,.)?: Unable Help needed moving to and from a bed to chair (including a wheelchair)?: A Little Help needed walking in hospital room?: A Little Help needed climbing 3-5 steps with a railing? : A Little 6 Click Score: 16    End of Session Equipment Utilized During Treatment: Gait belt;Oxygen Activity Tolerance: Patient tolerated treatment well Patient left: with call bell/phone within reach;with family/visitor present;in bed Nurse Communication: Mobility status PT Visit Diagnosis: Other abnormalities of gait and mobility (R26.89);Acosta Acosta - Right/Left: Right Acosta - part of body: Knee     Time: 1533-1600 PT Time Calculation (min) (ACUTE ONLY): 27 min  Charges:  $Therapeutic Exercise: 23-37 mins                    G Codes:       Erline Levine, PTA Pager: [phone]     Carolynne Edouard 12/14/2017, 4:24 PM

## 2017-12-15 ENCOUNTER — Encounter (HOSPITAL_COMMUNITY): Payer: Self-pay | Admitting: Internal Medicine

## 2017-12-15 NOTE — Anesthesia Postprocedure Evaluation (Signed)
Anesthesia Post Note  Patient: Deanna Acosta  Procedure(s) Performed: ESOPHAGOGASTRODUODENOSCOPY (EGD) (N/A ) FOREIGN BODY REMOVAL (N/A )     Patient location during evaluation: PACU Anesthesia Type: MAC Level of consciousness: awake and alert Pain management: pain level controlled Vital Signs Assessment: post-procedure vital signs reviewed and stable Respiratory status: spontaneous breathing, nonlabored ventilation, respiratory function stable and patient connected to nasal cannula oxygen Cardiovascular status: stable and blood pressure returned to baseline Postop Assessment: no apparent nausea or vomiting Anesthetic complications: no    Last Vitals:  Vitals:   12/14/17 1320 12/14/17 1441  BP: (!) 115/46   Pulse: (!) 101   Resp: (!) 21   Temp:    SpO2: 91% 95%    Last Pain:  Vitals:   12/14/17 1305  TempSrc: Oral  PainSc:                  Riccardo Dubin

## 2017-12-18 LAB — CULTURE, BLOOD (ROUTINE X 2)
Culture: NO GROWTH
Culture: NO GROWTH
Special Requests: ADEQUATE
Special Requests: ADEQUATE
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
## 2018-02-16 ENCOUNTER — Other Ambulatory Visit: Payer: Self-pay | Admitting: Family Medicine

## 2018-02-16 DIAGNOSIS — Z1231 Encounter for screening mammogram for malignant neoplasm of breast: Secondary | ICD-10-CM

## 2018-03-11 ENCOUNTER — Ambulatory Visit
Admission: RE | Admit: 2018-03-11 | Discharge: 2018-03-11 | Disposition: A | Discharge status: Home / Self Care | Payer: Medicare Other | Source: Ambulatory Visit | Attending: Family Medicine | Admitting: Family Medicine

## 2018-03-11 DIAGNOSIS — Z1231 Encounter for screening mammogram for malignant neoplasm of breast: Secondary | ICD-10-CM

## 2018-08-04 ENCOUNTER — Other Ambulatory Visit: Payer: Self-pay | Admitting: Family Medicine

## 2018-08-04 DIAGNOSIS — N6310 Unspecified lump in the right breast, unspecified quadrant: Secondary | ICD-10-CM

## 2018-08-06 ENCOUNTER — Ambulatory Visit
Admission: RE | Admit: 2018-08-06 | Discharge: 2018-08-06 | Disposition: A | Discharge status: Home / Self Care | Payer: Medicare Other | Source: Ambulatory Visit | Attending: Family Medicine | Admitting: Family Medicine

## 2018-08-06 DIAGNOSIS — N6310 Unspecified lump in the right breast, unspecified quadrant: Secondary | ICD-10-CM

## 2018-11-02 ENCOUNTER — Other Ambulatory Visit: Payer: Self-pay | Admitting: Family Medicine

## 2018-11-02 DIAGNOSIS — Z1231 Encounter for screening mammogram for malignant neoplasm of breast: Secondary | ICD-10-CM

## 2018-11-02 DIAGNOSIS — M810 Age-related osteoporosis without current pathological fracture: Secondary | ICD-10-CM

## 2018-11-16 ENCOUNTER — Other Ambulatory Visit: Payer: Self-pay | Admitting: Neurological Surgery

## 2018-11-24 NOTE — Pre-Procedure Instructions (Signed)
Deanna Acosta  11/24/2018      [facility] [address] [place] - [address] [place] [address] Phone: [phone] Fax: [phone]    Your procedure is scheduled on Mon., Feb. 24, 2020 from 12:08PM-2:39PM  Report to [facility] Entrance "A" at 10:05AM  Call this number if you have problems the morning of surgery:  [phone]   Remember:  Do not eat or drink after midnight on Feb. 23rd    Take these medicines the morning of surgery with A SIP OF WATER: Diltiazem (CARDIZEM CD), FLUoxetine (PROZAC),  and Omeprazole (PRILOSEC)  If needed: TraMADol (ULTRAM), Fluticasone (FLONASE), Fluticasone-Salmeterol (ADVAIR), and Albuterol Inhaler- bring inhalers with you the day of surgery  Follow your surgeon's instructions on when to stop Aspirin.  If no instructions were given by your surgeon then you will need to call the office to get those instructions.    As of today, stop taking all Other Aspirin Products, Vitamins, Fish oils, and Herbal medications. Also stop all NSAIDS i.e. Advil, Ibuprofen, Motrin, Aleve, Anaprox, Naproxen, BC, Goody Powders, and all Supplements.   Do not wear jewelry, make-up or nail polish.  Do not wear lotions, powders, or perfumes, or deodorant.  Do not shave 48 hours prior to surgery.    Do not bring valuables to the hospital.  [facility] is not responsible for any belongings or valuables.  Contacts, dentures or bridgework may not be worn into surgery.  Leave your suitcase in the car.  After surgery it may be brought to your room.  For patients admitted to the hospital, discharge time will be determined by your treatment team.  Patients discharged the day of surgery will not be allowed to drive home.   Special instructions:  [facility]- Preparing For Surgery  Before surgery, you can play an important role. Because skin is not sterile, your skin needs to be as free of germs as possible. You  can reduce the number of germs on your skin by washing with CHG (chlorahexidine gluconate) Soap before surgery.  CHG is an antiseptic cleaner which kills germs and bonds with the skin to continue killing germs even after washing.    Oral Hygiene is also important to reduce your risk of infection.  Remember - BRUSH YOUR TEETH THE MORNING OF SURGERY WITH YOUR REGULAR TOOTHPASTE  Please do not use if you have an allergy to CHG or antibacterial soaps. If your skin becomes reddened/irritated stop using the CHG.  Do not shave (including legs and underarms) for at least 48 hours prior to first CHG shower. It is OK to shave your face.  Please follow these instructions carefully.   1. Shower the NIGHT BEFORE SURGERY and the MORNING OF SURGERY with CHG.   2. If you chose to wash your hair, wash your hair first as usual with your normal shampoo.  3. After you shampoo, rinse your hair and body thoroughly to remove the shampoo.  4. Use CHG as you would any other liquid soap. You can apply CHG directly to the skin and wash gently with a scrungie or a clean washcloth.   5. Apply the CHG Soap to your body ONLY FROM THE NECK DOWN.  Do not use on open wounds or open sores. Avoid contact with your eyes, ears, mouth and genitals (private parts). Wash Face and genitals (private parts)  with your normal soap.  6. Wash thoroughly, paying special attention to the area where your surgery  will be performed.  7. Thoroughly rinse your body with warm water from the neck down.  8. DO NOT shower/wash with your normal soap after using and rinsing off the CHG Soap.  9. Pat yourself dry with a CLEAN TOWEL.  10. Wear CLEAN PAJAMAS to bed the night before surgery, wear comfortable clothes the morning of surgery  11. Place CLEAN SHEETS on your bed the night of your first shower and DO NOT SLEEP WITH PETS.  Day of Surgery:  Do not apply any deodorants/lotions.  Please wear clean clothes to the hospital/surgery center.    Remember to brush your teeth WITH YOUR REGULAR TOOTHPASTE.  Please read over the following fact sheets that you were given. Pain Booklet, Coughing and Deep Breathing, MRSA Information and Surgical Site Infection Prevention

## 2018-11-25 ENCOUNTER — Encounter (HOSPITAL_COMMUNITY): Payer: Self-pay

## 2018-11-25 ENCOUNTER — Encounter (HOSPITAL_COMMUNITY)
Admission: RE | Admit: 2018-11-25 | Discharge: 2018-11-25 | Disposition: A | Discharge status: Home / Self Care | Payer: Medicare Other | Source: Ambulatory Visit | Attending: Neurological Surgery | Admitting: Neurological Surgery

## 2018-11-25 ENCOUNTER — Other Ambulatory Visit: Payer: Self-pay

## 2018-11-25 DIAGNOSIS — Z01818 Encounter for other preprocedural examination: Secondary | ICD-10-CM | POA: Insufficient documentation

## 2018-11-25 DIAGNOSIS — G9589 Other specified diseases of spinal cord: Secondary | ICD-10-CM | POA: Insufficient documentation

## 2018-11-25 HISTORY — DX: Chronic kidney disease, unspecified: N18.9

## 2018-11-25 LAB — BASIC METABOLIC PANEL
Anion gap: 9 (ref 5–15)
BUN: 18 mg/dL (ref 8–23)
CO2: 23 mmol/L (ref 22–32)
Calcium: 9.4 mg/dL (ref 8.9–10.3)
Chloride: 106 mmol/L (ref 98–111)
Creatinine, Ser: 1.51 mg/dL — ABNORMAL HIGH (ref 0.44–1.00)
GFR calc Af Amer: 37 mL/min — ABNORMAL LOW (ref >60)
GFR calc non Af Amer: 32 mL/min — ABNORMAL LOW (ref >60)
Glucose, Bld: 114 mg/dL — ABNORMAL HIGH (ref 70–99)
Potassium: 3.8 mmol/L (ref 3.5–5.1)
Sodium: 138 mmol/L (ref 135–145)

## 2018-11-25 LAB — PROTIME-INR
INR: 1.03
Prothrombin Time: 13.4 seconds (ref 11.4–15.2)

## 2018-11-25 LAB — CBC WITH DIFFERENTIAL/PLATELET
Abs Immature Granulocytes: 0.06 10*3/uL (ref 0.00–0.07)
Basophils Absolute: 0.1 10*3/uL (ref 0.0–0.1)
Basophils Relative: 1 %
Eosinophils Absolute: 0.1 10*3/uL (ref 0.0–0.5)
Eosinophils Relative: 1 %
HCT: 37.5 % (ref 36.0–46.0)
Hemoglobin: 11.6 g/dL — ABNORMAL LOW (ref 12.0–15.0)
Immature Granulocytes: 1 %
Lymphocytes Relative: 16 %
Lymphs Abs: 2.1 10*3/uL (ref 0.7–4.0)
MCH: 27.4 pg (ref 26.0–34.0)
MCHC: 30.9 g/dL (ref 30.0–36.0)
MCV: 88.4 fL (ref 80.0–100.0)
Monocytes Absolute: 0.8 10*3/uL (ref 0.1–1.0)
Monocytes Relative: 6 %
Neutro Abs: 9.7 10*3/uL — ABNORMAL HIGH (ref 1.7–7.7)
Neutrophils Relative %: 75 %
Platelets: 347 10*3/uL (ref 150–400)
RBC: 4.24 MIL/uL (ref 3.87–5.11)
RDW: 14.4 % (ref 11.5–15.5)
WBC: 12.9 10*3/uL — ABNORMAL HIGH (ref 4.0–10.5)
nRBC: 0 % (ref 0.0–0.2)

## 2018-11-25 LAB — SURGICAL PCR SCREEN
MRSA, PCR: NEGATIVE
Staphylococcus aureus: POSITIVE — AB

## 2018-11-25 LAB — HEMOGLOBIN A1C
Hgb A1c MFr Bld: 5.6 % (ref 4.8–5.6)
Mean Plasma Glucose: 114.02 mg/dL

## 2018-11-25 NOTE — Progress Notes (Signed)
PCP - Dr Hyacinth MeekerDeboraha Sprang- I requested records  Cardiologist - no  Chest x-ray - 11/25/2018- pending  EKG - 11/25/18 Stress Test -   ECHO - no  Cardiac Cath - no  Sleep Study - noCPAP - no  LABS- CBC, BMP, A1C  ASA-on hold, doesn't remember anyone telling her to take it just does.  HA1C  Fasting Blood Sugar - o Checks Blood Sugar _0____ times a day  Anesthesia-  Pt denies having chest pain, sob, or fever at this time. All instructions explained to the pt, with a verbal understanding of the material. Pt agrees to go over the instructions while at home for a better understanding. The opportunity to ask questions was provided.

## 2018-11-29 ENCOUNTER — Inpatient Hospital Stay (HOSPITAL_COMMUNITY): Payer: Medicare Other | Admitting: Anesthesiology

## 2018-11-29 ENCOUNTER — Inpatient Hospital Stay (HOSPITAL_COMMUNITY): Payer: Medicare Other

## 2018-11-29 ENCOUNTER — Inpatient Hospital Stay (HOSPITAL_COMMUNITY): Payer: Medicare Other | Admitting: Physician Assistant

## 2018-11-29 ENCOUNTER — Encounter (HOSPITAL_COMMUNITY)
Admission: RE | Disposition: A | Discharge status: Home Health | Payer: Self-pay | Source: Home / Self Care | Attending: Neurological Surgery

## 2018-11-29 ENCOUNTER — Inpatient Hospital Stay (HOSPITAL_COMMUNITY)
Admission: RE | Admit: 2018-11-29 | Discharge: 2018-12-02 | DRG: 029 | Disposition: A | Discharge status: Home Health | Payer: Medicare Other | Attending: Neurosurgery | Admitting: Neurosurgery

## 2018-11-29 ENCOUNTER — Other Ambulatory Visit: Payer: Self-pay

## 2018-11-29 ENCOUNTER — Encounter (HOSPITAL_COMMUNITY): Payer: Self-pay | Admitting: Anesthesiology

## 2018-11-29 DIAGNOSIS — R7303 Prediabetes: Secondary | ICD-10-CM | POA: Diagnosis present

## 2018-11-29 DIAGNOSIS — Z961 Presence of intraocular lens: Secondary | ICD-10-CM | POA: Diagnosis present

## 2018-11-29 DIAGNOSIS — R0902 Hypoxemia: Secondary | ICD-10-CM

## 2018-11-29 DIAGNOSIS — G9589 Other specified diseases of spinal cord: Secondary | ICD-10-CM | POA: Diagnosis present

## 2018-11-29 DIAGNOSIS — Z880 Allergy status to penicillin: Secondary | ICD-10-CM | POA: Diagnosis not present

## 2018-11-29 DIAGNOSIS — E669 Obesity, unspecified: Secondary | ICD-10-CM | POA: Diagnosis present

## 2018-11-29 DIAGNOSIS — Z90710 Acquired absence of both cervix and uterus: Secondary | ICD-10-CM | POA: Diagnosis not present

## 2018-11-29 DIAGNOSIS — R51 Headache: Secondary | ICD-10-CM | POA: Diagnosis not present

## 2018-11-29 DIAGNOSIS — G9050 Complex regional pain syndrome I, unspecified: Secondary | ICD-10-CM | POA: Diagnosis present

## 2018-11-29 DIAGNOSIS — Z7951 Long term (current) use of inhaled steroids: Secondary | ICD-10-CM | POA: Diagnosis not present

## 2018-11-29 DIAGNOSIS — Z87891 Personal history of nicotine dependence: Secondary | ICD-10-CM

## 2018-11-29 DIAGNOSIS — I129 Hypertensive chronic kidney disease with stage 1 through stage 4 chronic kidney disease, or unspecified chronic kidney disease: Secondary | ICD-10-CM | POA: Diagnosis present

## 2018-11-29 DIAGNOSIS — Z9842 Cataract extraction status, left eye: Secondary | ICD-10-CM | POA: Diagnosis not present

## 2018-11-29 DIAGNOSIS — Z7982 Long term (current) use of aspirin: Secondary | ICD-10-CM

## 2018-11-29 DIAGNOSIS — Z803 Family history of malignant neoplasm of breast: Secondary | ICD-10-CM

## 2018-11-29 DIAGNOSIS — J449 Chronic obstructive pulmonary disease, unspecified: Secondary | ICD-10-CM | POA: Diagnosis present

## 2018-11-29 DIAGNOSIS — Z9049 Acquired absence of other specified parts of digestive tract: Secondary | ICD-10-CM | POA: Diagnosis not present

## 2018-11-29 DIAGNOSIS — Z96651 Presence of right artificial knee joint: Secondary | ICD-10-CM

## 2018-11-29 DIAGNOSIS — Z96653 Presence of artificial knee joint, bilateral: Secondary | ICD-10-CM | POA: Diagnosis present

## 2018-11-29 DIAGNOSIS — Z79899 Other long term (current) drug therapy: Secondary | ICD-10-CM | POA: Diagnosis not present

## 2018-11-29 DIAGNOSIS — Z888 Allergy status to other drugs, medicaments and biological substances status: Secondary | ICD-10-CM

## 2018-11-29 DIAGNOSIS — N189 Chronic kidney disease, unspecified: Secondary | ICD-10-CM | POA: Diagnosis present

## 2018-11-29 DIAGNOSIS — K219 Gastro-esophageal reflux disease without esophagitis: Secondary | ICD-10-CM | POA: Diagnosis present

## 2018-11-29 DIAGNOSIS — Z9841 Cataract extraction status, right eye: Secondary | ICD-10-CM

## 2018-11-29 DIAGNOSIS — Z79891 Long term (current) use of opiate analgesic: Secondary | ICD-10-CM | POA: Diagnosis not present

## 2018-11-29 DIAGNOSIS — F329 Major depressive disorder, single episode, unspecified: Secondary | ICD-10-CM | POA: Diagnosis present

## 2018-11-29 DIAGNOSIS — F419 Anxiety disorder, unspecified: Secondary | ICD-10-CM | POA: Diagnosis present

## 2018-11-29 DIAGNOSIS — M810 Age-related osteoporosis without current pathological fracture: Secondary | ICD-10-CM | POA: Diagnosis present

## 2018-11-29 DIAGNOSIS — Z419 Encounter for procedure for purposes other than remedying health state, unspecified: Secondary | ICD-10-CM

## 2018-11-29 DIAGNOSIS — Z6831 Body mass index (BMI) 31.0-31.9, adult: Secondary | ICD-10-CM | POA: Diagnosis not present

## 2018-11-29 DIAGNOSIS — E785 Hyperlipidemia, unspecified: Secondary | ICD-10-CM | POA: Diagnosis present

## 2018-11-29 DIAGNOSIS — Z9889 Other specified postprocedural states: Secondary | ICD-10-CM

## 2018-11-29 HISTORY — DX: Prediabetes: R73.03

## 2018-11-29 HISTORY — PX: LAMINECTOMY: SHX219

## 2018-11-29 SURGERY — THORACIC LAMINECTOMY FOR TUMOR
Anesthesia: General | Site: Back

## 2018-11-29 MED ORDER — DILTIAZEM HCL ER COATED BEADS 120 MG PO CP24
120.0000 mg | ORAL_CAPSULE | Freq: Every day | ORAL | Status: DC
  Administered 2018-11-30 – 2018-12-02 (×3): 120 mg via ORAL
  Filled 2018-11-29 (×3): qty 1

## 2018-11-29 MED ORDER — MORPHINE SULFATE (PF) 2 MG/ML IV SOLN
1.0000 mg | INTRAVENOUS | Status: DC | PRN
  Administered 2018-11-29 – 2018-12-02 (×5): 1 mg via INTRAVENOUS
  Filled 2018-11-29 (×5): qty 1

## 2018-11-29 MED ORDER — VANCOMYCIN HCL IN DEXTROSE 1-5 GM/200ML-% IV SOLN
1000.0000 mg | INTRAVENOUS | Status: AC
  Administered 2018-11-29: 1000 mg via INTRAVENOUS

## 2018-11-29 MED ORDER — SODIUM CHLORIDE 0.9% FLUSH: 3.0000 mL | INTRAVENOUS | Status: DC | PRN

## 2018-11-29 MED ORDER — HYDROCHLOROTHIAZIDE 25 MG PO TABS
25.0000 mg | ORAL_TABLET | Freq: Every day | ORAL | Status: DC
  Filled 2018-11-29 (×4): qty 1

## 2018-11-29 MED ORDER — LOSARTAN POTASSIUM 50 MG PO TABS
100.0000 mg | ORAL_TABLET | Freq: Every day | ORAL | Status: DC
  Administered 2018-11-30 – 2018-12-02 (×3): 100 mg via ORAL
  Filled 2018-11-29 (×4): qty 2

## 2018-11-29 MED ORDER — FENTANYL CITRATE (PF) 250 MCG/5ML IJ SOLN
INTRAMUSCULAR | Status: AC
  Filled 2018-11-29: qty 5

## 2018-11-29 MED ORDER — LIDOCAINE 2% (20 MG/ML) 5 ML SYRINGE
INTRAMUSCULAR | Status: DC | PRN
  Administered 2018-11-29: 60 mg via INTRAVENOUS

## 2018-11-29 MED ORDER — ONDANSETRON HCL 4 MG/2ML IJ SOLN
INTRAMUSCULAR | Status: AC
  Filled 2018-11-29: qty 2

## 2018-11-29 MED ORDER — ONDANSETRON HCL 4 MG/2ML IJ SOLN: 4.0000 mg | Freq: Once | INTRAMUSCULAR | Status: DC | PRN

## 2018-11-29 MED ORDER — 0.9 % SODIUM CHLORIDE (POUR BTL) OPTIME
TOPICAL | Status: DC | PRN
  Administered 2018-11-29: 1000 mL

## 2018-11-29 MED ORDER — PROPOFOL 10 MG/ML IV BOLUS
INTRAVENOUS | Status: AC
  Filled 2018-11-29: qty 20

## 2018-11-29 MED ORDER — BUPIVACAINE HCL (PF) 0.25 % IJ SOLN
INTRAMUSCULAR | Status: AC
  Filled 2018-11-29: qty 30

## 2018-11-29 MED ORDER — PHENOL 1.4 % MT LIQD: 1.0000 | OROMUCOSAL | Status: DC | PRN

## 2018-11-29 MED ORDER — FENTANYL CITRATE (PF) 100 MCG/2ML IJ SOLN
INTRAMUSCULAR | Status: DC | PRN
  Administered 2018-11-29: 25 ug via INTRAVENOUS
  Administered 2018-11-29 (×2): 50 ug via INTRAVENOUS
  Administered 2018-11-29: 25 ug via INTRAVENOUS
  Administered 2018-11-29 (×2): 50 ug via INTRAVENOUS

## 2018-11-29 MED ORDER — METHOCARBAMOL 1000 MG/10ML IJ SOLN
500.0000 mg | Freq: Four times a day (QID) | INTRAVENOUS | Status: DC | PRN
  Filled 2018-11-29: qty 5

## 2018-11-29 MED ORDER — CHLORHEXIDINE GLUCONATE CLOTH 2 % EX PADS: 6.0000 | MEDICATED_PAD | Freq: Once | CUTANEOUS | Status: DC

## 2018-11-29 MED ORDER — FLUOXETINE HCL 40 MG PO CAPS: 40.0000 mg | ORAL_CAPSULE | Freq: Every day | ORAL | Status: DC

## 2018-11-29 MED ORDER — ONDANSETRON HCL 4 MG/2ML IJ SOLN
4.0000 mg | Freq: Four times a day (QID) | INTRAMUSCULAR | Status: DC | PRN
  Administered 2018-11-29: 4 mg via INTRAVENOUS
  Filled 2018-11-29: qty 2

## 2018-11-29 MED ORDER — MOMETASONE FURO-FORMOTEROL FUM 200-5 MCG/ACT IN AERO
2.0000 | INHALATION_SPRAY | Freq: Two times a day (BID) | RESPIRATORY_TRACT | Status: DC
  Administered 2018-11-29 – 2018-12-01 (×4): 2 via RESPIRATORY_TRACT
  Filled 2018-11-29: qty 8.8

## 2018-11-29 MED ORDER — POTASSIUM CHLORIDE IN NACL 20-0.9 MEQ/L-% IV SOLN
INTRAVENOUS | Status: DC
  Administered 2018-11-30: 04:00:00 via INTRAVENOUS
  Filled 2018-11-29: qty 1000

## 2018-11-29 MED ORDER — ROCURONIUM BROMIDE 50 MG/5ML IV SOSY
PREFILLED_SYRINGE | INTRAVENOUS | Status: DC | PRN
  Administered 2018-11-29: 20 mg via INTRAVENOUS
  Administered 2018-11-29: 50 mg via INTRAVENOUS

## 2018-11-29 MED ORDER — HYDROCODONE-ACETAMINOPHEN 7.5-325 MG PO TABS
1.0000 | ORAL_TABLET | Freq: Four times a day (QID) | ORAL | Status: DC
  Administered 2018-11-29 – 2018-12-02 (×10): 1 via ORAL
  Filled 2018-11-29 (×10): qty 1

## 2018-11-29 MED ORDER — SENNA 8.6 MG PO TABS
1.0000 | ORAL_TABLET | Freq: Two times a day (BID) | ORAL | Status: DC
  Administered 2018-11-29 – 2018-12-02 (×4): 8.6 mg via ORAL
  Filled 2018-11-29 (×7): qty 1

## 2018-11-29 MED ORDER — THROMBIN 20000 UNITS EX SOLR
CUTANEOUS | Status: AC
  Filled 2018-11-29: qty 20000

## 2018-11-29 MED ORDER — SUGAMMADEX SODIUM 200 MG/2ML IV SOLN
INTRAVENOUS | Status: DC | PRN
  Administered 2018-11-29: 200 mg via INTRAVENOUS

## 2018-11-29 MED ORDER — SODIUM CHLORIDE 0.9% FLUSH
3.0000 mL | Freq: Two times a day (BID) | INTRAVENOUS | Status: DC
  Administered 2018-11-29 – 2018-12-02 (×5): 3 mL via INTRAVENOUS

## 2018-11-29 MED ORDER — ORAL CARE MOUTH RINSE
15.0000 mL | Freq: Two times a day (BID) | OROMUCOSAL | Status: DC
  Administered 2018-11-29 – 2018-12-02 (×6): 15 mL via OROMUCOSAL

## 2018-11-29 MED ORDER — LACTATED RINGERS IV SOLN
INTRAVENOUS | Status: DC
  Administered 2018-11-29 (×2): via INTRAVENOUS

## 2018-11-29 MED ORDER — ONDANSETRON HCL 4 MG/2ML IJ SOLN
INTRAMUSCULAR | Status: DC | PRN
  Administered 2018-11-29: 4 mg via INTRAVENOUS

## 2018-11-29 MED ORDER — DEXAMETHASONE SODIUM PHOSPHATE 10 MG/ML IJ SOLN
INTRAMUSCULAR | Status: AC
  Filled 2018-11-29: qty 1

## 2018-11-29 MED ORDER — THROMBIN 5000 UNITS EX SOLR
OROMUCOSAL | Status: DC | PRN
  Administered 2018-11-29: 13:00:00 via TOPICAL

## 2018-11-29 MED ORDER — PROPOFOL 10 MG/ML IV BOLUS
INTRAVENOUS | Status: DC | PRN
  Administered 2018-11-29: 50 mg via INTRAVENOUS
  Administered 2018-11-29: 100 mg via INTRAVENOUS

## 2018-11-29 MED ORDER — ACETAMINOPHEN 500 MG PO TABS
1000.0000 mg | ORAL_TABLET | Freq: Once | ORAL | Status: AC
  Administered 2018-11-29: 1000 mg via ORAL
  Filled 2018-11-29: qty 2

## 2018-11-29 MED ORDER — PROPOFOL 500 MG/50ML IV EMUL
INTRAVENOUS | Status: DC | PRN
  Administered 2018-11-29: 25 ug/kg/min via INTRAVENOUS

## 2018-11-29 MED ORDER — THROMBIN 5000 UNITS EX SOLR
CUTANEOUS | Status: AC
  Filled 2018-11-29: qty 5000

## 2018-11-29 MED ORDER — KETOROLAC TROMETHAMINE 15 MG/ML IJ SOLN: 15.0000 mg | Freq: Once | INTRAMUSCULAR | Status: DC | PRN

## 2018-11-29 MED ORDER — BUPIVACAINE HCL (PF) 0.25 % IJ SOLN
INTRAMUSCULAR | Status: DC | PRN
  Administered 2018-11-29: 7 mL

## 2018-11-29 MED ORDER — FENTANYL CITRATE (PF) 100 MCG/2ML IJ SOLN
25.0000 ug | INTRAMUSCULAR | Status: DC | PRN
  Administered 2018-11-29 (×2): 25 ug via INTRAVENOUS

## 2018-11-29 MED ORDER — ROCURONIUM BROMIDE 50 MG/5ML IV SOSY
PREFILLED_SYRINGE | INTRAVENOUS | Status: AC
  Filled 2018-11-29: qty 5

## 2018-11-29 MED ORDER — FENTANYL CITRATE (PF) 100 MCG/2ML IJ SOLN
INTRAMUSCULAR | Status: AC
  Administered 2018-11-29: 25 ug via INTRAVENOUS
  Filled 2018-11-29: qty 2

## 2018-11-29 MED ORDER — DEXAMETHASONE SODIUM PHOSPHATE 4 MG/ML IJ SOLN
INTRAMUSCULAR | Status: DC | PRN
  Administered 2018-11-29: 4 mg via INTRAVENOUS

## 2018-11-29 MED ORDER — ACETAMINOPHEN 650 MG RE SUPP: 650.0000 mg | RECTAL | Status: DC | PRN

## 2018-11-29 MED ORDER — ACETAMINOPHEN 325 MG PO TABS
650.0000 mg | ORAL_TABLET | ORAL | Status: DC | PRN
  Administered 2018-11-29: 650 mg via ORAL
  Filled 2018-11-29: qty 2

## 2018-11-29 MED ORDER — FLUOXETINE HCL 20 MG PO CAPS
40.0000 mg | ORAL_CAPSULE | Freq: Every day | ORAL | Status: DC
  Administered 2018-11-30 – 2018-12-02 (×3): 40 mg via ORAL
  Filled 2018-11-29 (×3): qty 2

## 2018-11-29 MED ORDER — SODIUM CHLORIDE 0.9 % IV SOLN
INTRAVENOUS | Status: DC | PRN
  Administered 2018-11-29: 25 ug/min via INTRAVENOUS

## 2018-11-29 MED ORDER — METHOCARBAMOL 500 MG PO TABS
500.0000 mg | ORAL_TABLET | Freq: Four times a day (QID) | ORAL | Status: DC | PRN
  Administered 2018-11-29 – 2018-11-30 (×4): 500 mg via ORAL
  Filled 2018-11-29 (×4): qty 1

## 2018-11-29 MED ORDER — ALBUTEROL SULFATE (2.5 MG/3ML) 0.083% IN NEBU: 3.0000 mL | INHALATION_SOLUTION | Freq: Four times a day (QID) | RESPIRATORY_TRACT | Status: DC | PRN

## 2018-11-29 MED ORDER — THROMBIN 20000 UNITS EX SOLR
CUTANEOUS | Status: DC | PRN
  Administered 2018-11-29: 13:00:00 via TOPICAL

## 2018-11-29 MED ORDER — MENTHOL 3 MG MT LOZG: 1.0000 | LOZENGE | OROMUCOSAL | Status: DC | PRN

## 2018-11-29 MED ORDER — PROPOFOL 10 MG/ML IV BOLUS
INTRAVENOUS | Status: AC
  Filled 2018-11-29: qty 40

## 2018-11-29 MED ORDER — CELECOXIB 200 MG PO CAPS
200.0000 mg | ORAL_CAPSULE | Freq: Two times a day (BID) | ORAL | Status: DC
  Administered 2018-11-29 – 2018-12-02 (×6): 200 mg via ORAL
  Filled 2018-11-29 (×7): qty 1

## 2018-11-29 MED ORDER — LIDOCAINE 2% (20 MG/ML) 5 ML SYRINGE
INTRAMUSCULAR | Status: AC
  Filled 2018-11-29: qty 5

## 2018-11-29 MED ORDER — ONDANSETRON HCL 4 MG PO TABS: 4.0000 mg | ORAL_TABLET | Freq: Four times a day (QID) | ORAL | Status: DC | PRN

## 2018-11-29 MED ORDER — HEMOSTATIC AGENTS (NO CHARGE) OPTIME
TOPICAL | Status: DC | PRN
  Administered 2018-11-29: 1 via TOPICAL

## 2018-11-29 MED ORDER — SODIUM CHLORIDE 0.9 % IV SOLN: 250.0000 mL | INTRAVENOUS | Status: DC

## 2018-11-29 MED ORDER — VANCOMYCIN HCL IN DEXTROSE 1-5 GM/200ML-% IV SOLN
1000.0000 mg | Freq: Once | INTRAVENOUS | Status: AC
  Administered 2018-11-29: 1000 mg via INTRAVENOUS
  Filled 2018-11-29: qty 200

## 2018-11-29 SURGICAL SUPPLY — 70 items
ADH SKN CLS APL DERMABOND .7 (GAUZE/BANDAGES/DRESSINGS) ×1
APL SKNCLS STERI-STRIP NONHPOA (GAUZE/BANDAGES/DRESSINGS) ×1
BAG DECANTER FOR FLEXI CONT (MISCELLANEOUS) ×2 IMPLANT
BENZOIN TINCTURE PRP APPL 2/3 (GAUZE/BANDAGES/DRESSINGS) ×2 IMPLANT
BLADE CLIPPER SURG (BLADE) IMPLANT
BUR MATCHSTICK NEURO 3.0 LAGG (BURR) ×1 IMPLANT
CABLE SNG STERILE W/CRIMP (MISCELLANEOUS) IMPLANT
CANISTER SUCT 3000ML PPV (MISCELLANEOUS) ×2 IMPLANT
CARTRIDGE OIL MAESTRO DRILL (MISCELLANEOUS) ×1 IMPLANT
COVER WAND RF STERILE (DRAPES) ×1 IMPLANT
DERMABOND ADVANCED (GAUZE/BANDAGES/DRESSINGS) ×1
DERMABOND ADVANCED .7 DNX12 (GAUZE/BANDAGES/DRESSINGS) ×1 IMPLANT
DIFFUSER DRILL AIR PNEUMATIC (MISCELLANEOUS) ×2 IMPLANT
DRAPE C-ARM 42X72 X-RAY (DRAPES) ×2 IMPLANT
DRAPE LAPAROTOMY 100X72 PEDS (DRAPES) IMPLANT
DRAPE LAPAROTOMY 100X72X124 (DRAPES) ×2 IMPLANT
DRAPE MICROSCOPE LEICA (MISCELLANEOUS) ×2 IMPLANT
DRAPE POUCH INSTRU U-SHP 10X18 (DRAPES) ×1 IMPLANT
DRAPE SURG 17X23 STRL (DRAPES) ×3 IMPLANT
DRSG OPSITE POSTOP 4X6 (GAUZE/BANDAGES/DRESSINGS) ×1 IMPLANT
ELECT REM PT RETURN 9FT ADLT (ELECTROSURGICAL) ×2
ELECTRODE REM PT RTRN 9FT ADLT (ELECTROSURGICAL) ×1 IMPLANT
FORCEPS BIPOLAR SPETZLER 8 1.0 (NEUROSURGERY SUPPLIES) ×1 IMPLANT
GAUZE 4X4 16PLY RFD (DISPOSABLE) IMPLANT
GAUZE SPONGE 4X4 12PLY STRL (GAUZE/BANDAGES/DRESSINGS) ×1 IMPLANT
GLOVE BIO SURGEON STRL SZ7 (GLOVE) ×3 IMPLANT
GLOVE BIO SURGEON STRL SZ8 (GLOVE) ×2 IMPLANT
GLOVE BIOGEL PI IND STRL 6.5 (GLOVE) IMPLANT
GLOVE BIOGEL PI IND STRL 7.0 (GLOVE) IMPLANT
GLOVE BIOGEL PI IND STRL 7.5 (GLOVE) IMPLANT
GLOVE BIOGEL PI INDICATOR 6.5 (GLOVE) ×1
GLOVE BIOGEL PI INDICATOR 7.0 (GLOVE) ×1
GLOVE BIOGEL PI INDICATOR 7.5 (GLOVE) ×2
GLOVE SURG SS PI 6.0 STRL IVOR (GLOVE) ×1 IMPLANT
GOWN STRL REUS W/ TWL LRG LVL3 (GOWN DISPOSABLE) IMPLANT
GOWN STRL REUS W/ TWL XL LVL3 (GOWN DISPOSABLE) IMPLANT
GOWN STRL REUS W/TWL 2XL LVL3 (GOWN DISPOSABLE) ×1 IMPLANT
GOWN STRL REUS W/TWL LRG LVL3 (GOWN DISPOSABLE) ×4
GOWN STRL REUS W/TWL XL LVL3 (GOWN DISPOSABLE) ×2
HEMOSTAT POWDER KIT SURGIFOAM (HEMOSTASIS) ×1 IMPLANT
HEMOSTAT SURGICEL 2X14 (HEMOSTASIS) IMPLANT
KIT BASIN OR (CUSTOM PROCEDURE TRAY) ×2 IMPLANT
KIT TURNOVER KIT B (KITS) ×2 IMPLANT
NDL SPNL 20GX3.5 QUINCKE YW (NEEDLE) IMPLANT
NEEDLE HYPO 22GX1.5 SAFETY (NEEDLE) ×2 IMPLANT
NEEDLE SPNL 20GX3.5 QUINCKE YW (NEEDLE) ×2 IMPLANT
NS IRRIG 1000ML POUR BTL (IV SOLUTION) ×2 IMPLANT
OIL CARTRIDGE MAESTRO DRILL (MISCELLANEOUS) ×2
PACK LAMINECTOMY NEURO (CUSTOM PROCEDURE TRAY) ×2 IMPLANT
PATTIES SURGICAL .5 X3 (DISPOSABLE) ×2 IMPLANT
RUBBERBAND STERILE (MISCELLANEOUS) ×4 IMPLANT
SEALANT ADHERUS EXTEND TIP (MISCELLANEOUS) ×1 IMPLANT
SPONGE LAP 4X18 RFD (DISPOSABLE) IMPLANT
SPONGE SURGIFOAM ABS GEL 100 (HEMOSTASIS) ×1 IMPLANT
STAPLER VISISTAT 35W (STAPLE) ×1 IMPLANT
STRIP CLOSURE SKIN 1/2X4 (GAUZE/BANDAGES/DRESSINGS) ×2 IMPLANT
SUT BONE WAX W31G (SUTURE) IMPLANT
SUT ETHILON 4 0 PS 2 18 (SUTURE) ×1 IMPLANT
SUT NURALON 4 0 TR CR/8 (SUTURE) ×2 IMPLANT
SUT PROLENE 6 0 BV (SUTURE) IMPLANT
SUT SILK 2 0 TIES 10X30 (SUTURE) ×1 IMPLANT
SUT VIC AB 0 CT1 18XCR BRD8 (SUTURE) IMPLANT
SUT VIC AB 0 CT1 8-18 (SUTURE) ×2
SUT VIC AB 2-0 CP2 18 (SUTURE) ×2 IMPLANT
SUT VIC AB 3-0 SH 8-18 (SUTURE) ×2 IMPLANT
SUT VICRYL 4-0 PS2 18IN ABS (SUTURE) IMPLANT
TOWEL GREEN STERILE (TOWEL DISPOSABLE) ×2 IMPLANT
TOWEL GREEN STERILE FF (TOWEL DISPOSABLE) ×2 IMPLANT
TRAY FOLEY MTR SLVR 16FR STAT (SET/KITS/TRAYS/PACK) ×1 IMPLANT
WATER STERILE IRR 1000ML POUR (IV SOLUTION) ×2 IMPLANT

## 2018-11-29 NOTE — Anesthesia Procedure Notes (Signed)
Procedure Name: Intubation Date/Time: 11/29/2018 11:18 AM Performed by: Caren Macadam, CRNA Pre-anesthesia Checklist: Patient identified, Emergency Drugs available, Suction available and Patient being monitored Patient Re-evaluated:Patient Re-evaluated prior to induction Oxygen Delivery Method: Circle system utilized Preoxygenation: Pre-oxygenation with 100% oxygen Induction Type: IV induction Ventilation: Mask ventilation without difficulty Laryngoscope Size: Miller and 2 Grade View: Grade II Tube type: Oral Tube size: 7.0 mm Number of attempts: 1 Airway Equipment and Method: Stylet Placement Confirmation: ETT inserted through vocal cords under direct vision,  positive ETCO2 and breath sounds checked- equal and bilateral Secured at: 22 cm Tube secured with: Tape Dental Injury: Teeth and Oropharynx as per pre-operative assessment

## 2018-11-29 NOTE — Anesthesia Postprocedure Evaluation (Signed)
Anesthesia Post Note  Patient: Deanna Acosta  Procedure(s) Performed: Laminectomy - Thoracic eleven-thoracic twelve- intradural extramedullary mass resection (N/A Back)     Patient location during evaluation: PACU Anesthesia Type: General Level of consciousness: awake Pain management: pain level controlled Vital Signs Assessment: post-procedure vital signs reviewed and stable Respiratory status: spontaneous breathing, nonlabored ventilation, respiratory function stable and patient connected to nasal cannula oxygen Cardiovascular status: blood pressure returned to baseline and stable Postop Assessment: no apparent nausea or vomiting Anesthetic complications: no    Last Vitals:  Vitals:   11/29/18 1415 11/29/18 1457  BP: (!) 148/68 136/67  Pulse: 78 75  Resp: 17 20  Temp: (!) 36.3 C 36.4 C  SpO2: 95% 93%    Last Pain:  Vitals:   11/29/18 1457  TempSrc: Oral  PainSc:                  Thedford Bunton P Seibert Keeter
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
## 2018-11-29 NOTE — Transfer of Care (Signed)
Immediate Anesthesia Transfer of Care Note  Patient: Deanna Acosta  Procedure(s) Performed: Laminectomy - Thoracic eleven-thoracic twelve- intradural extramedullary mass resection (N/A Back)  Patient Location: PACU  Anesthesia Type:General  Level of Consciousness: awake, alert  and oriented  Airway & Oxygen Therapy: Patient Spontanous Breathing and Patient connected to face mask oxygen  Post-op Assessment: Report given to RN and Post -op Vital signs reviewed and stable  Post vital signs: Reviewed and stable  Last Vitals:  Vitals Value Taken Time  BP 154/70 11/29/2018  1:35 PM  Temp    Pulse 94 11/29/2018  1:36 PM  Resp 14 11/29/2018  1:36 PM  SpO2 92 % 11/29/2018  1:36 PM  Vitals shown include unvalidated device data.  Last Pain:  Vitals:   11/29/18 1048  TempSrc:   PainSc: 0-No pain         Complications: No apparent anesthesia complications

## 2018-11-29 NOTE — Op Note (Signed)
11/29/2018  1:33 PM  PATIENT:  Deanna Acosta  83 y.o. female  PRE-OPERATIVE DIAGNOSIS: Intradural extra medullary mass T11  POST-OPERATIVE DIAGNOSIS:  same  PROCEDURE: T11 laminectomy for resection of intradural extra medullary mass  SURGEON:  Marikay Alar, MD  ASSISTANTS: Verlin Dike FNP  ANESTHESIA:   General  EBL: 25 ml  Total I/O In: 700 [I.V.:700] Out: 125 [Urine:100; Blood:25]  BLOOD ADMINISTERED: none  DRAINS: none  SPECIMEN:  none  INDICATION FOR PROCEDURE: This patient presented with a dural extra medullary mass at T11 showed both on serial MRI.  Recommended appendectomy for resection of the lesion. Patient understood the risks, benefits, and alternatives and potential outcomes and wished to proceed.  PROCEDURE DETAILS: The patient was taken to the operating room and after induction of adequate generalized endotracheal anesthesia, the patient was rolled into the prone position on the Thinnes frame and all pressure points were padded. The thoracic and lumbar region was cleaned and then prepped with DuraPrep and draped in the usual sterile fashion. 5 cc of local anesthesia was injected and then a dorsal midline incision was made and carried down to the Hasek fascia. The fascia was opened and the paraspinous musculature was taken down in a subperiosteal fashion to expose T11 and T12 bilaterally. Intraoperative fluoroscopy confirmed my level, and then I used a combination of the high-speed drill and the Kerrison punches to perform a hemilaminectomy, medial facetectomy, and foraminotomy at T11. The underlying yellow ligament was opened and removed in a piecemeal fashion to expose the underlying dura and exiting nerve root. I undercut the lateral recess and dissected down until I was medial to and distal to the pedicle.  We made a midline durotomy and tacked these back with 4-0 Nurolon sutures.  Utilizing microscopic dissection we remove the arachnoid adhesions and found a  small globular tumor attached to the lateral part of the dura on the right.  This was removed with microdissection and tumor forceps.  Then coagulated the lateral part of the dura.  The tumor was sent for permanent pathology.  Irrigated with saline and inspected the dura once again.  The spinal cord and nerve roots looked good.  We then closed the dura with a running 4-0 Nurolon suture and then checked for any leakage with a Valsalva up to 30 or 40.  Irrigated with saline solution and lined the dura with Tisseel fibrin glue and Gelfoam.  I irrigated with saline solution containing bacitracin. Achieved hemostasis with bipolar cautery,  and then closed the fascia with 0 Vicryl. I closed the subcutaneous tissues with 2-0 Vicryl and the subcuticular tissues with 3-0 Vicryl. The skin was then closed with benzoin and Steri-Strips. The drapes were removed, a sterile dressing was applied. The patient was awakened from general anesthesia and transferred to the recovery room in stable condition. At the end of the procedure all sponge, needle and instrument counts were correct.    PLAN OF CARE: Admit to inpatient   PATIENT DISPOSITION:  PACU - hemodynamically stable.   Delay start of Pharmacological VTE agent (>24hrs) due to surgical blood loss or risk of bleeding:  yes

## 2018-11-29 NOTE — Anesthesia Preprocedure Evaluation (Addendum)
Anesthesia Evaluation  Patient identified by MRN, date of birth, ID band Patient awake    Reviewed: Allergy & Precautions, NPO status , Patient's Chart, lab work & pertinent test results  History of Anesthesia Complications (+) PONV and history of anesthetic complications  Airway Mallampati: I  TM Distance: >3 FB Neck ROM: Full    Dental  (+) Missing   Pulmonary shortness of breath and with exertion, asthma , COPD,  COPD inhaler, former smoker,    Pulmonary exam normal breath sounds clear to auscultation       Cardiovascular hypertension, Pt. on medications Normal cardiovascular exam Rhythm:Regular Rate:Normal  ECG: NSR, rate 78   Neuro/Psych  Headaches, PSYCHIATRIC DISORDERS Anxiety Depression  Neuromuscular disease    GI/Hepatic Neg liver ROS, hiatal hernia, GERD  Medicated and Controlled,  Endo/Other  diabetes (PRE)  Renal/GU Renal InsufficiencyRenal disease     Musculoskeletal RSD (reflex sympathetic dystrophy)   Abdominal (+) + obese,   Peds  Hematology HLD   Anesthesia Other Findings Intradural mass  Reproductive/Obstetrics                           Anesthesia Physical Anesthesia Plan  ASA: III  Anesthesia Plan: General   Post-op Pain Management:    Induction: Intravenous  PONV Risk Score and Plan: 4 or greater and Ondansetron, Dexamethasone, Treatment may vary due to age or medical condition and Propofol infusion  Airway Management Planned: Oral ETT  Additional Equipment:   Intra-op Plan:   Post-operative Plan: Extubation in OR  Informed Consent: I have reviewed the patients History and Physical, chart, labs and discussed the procedure including the risks, benefits and alternatives for the proposed anesthesia with the patient or authorized representative who has indicated his/her understanding and acceptance.     Dental advisory given  Plan Discussed with:  CRNA  Anesthesia Plan Comments:         Anesthesia Quick Evaluation

## 2018-11-30 ENCOUNTER — Encounter (HOSPITAL_COMMUNITY): Payer: Self-pay | Admitting: Neurological Surgery

## 2018-11-30 NOTE — Progress Notes (Signed)
Subjective: Patient reports some mild back pain and left sided neck pain. Reports some mild headaches overnight  Objective: Vital signs in last 24 hours: Temp:  [97.3 F (36.3 C)-97.8 F (36.6 C)] 97.8 F (36.6 C) (02/25 0620) Pulse Rate:  [72-94] 84 (02/25 0620) Resp:  [12-21] 21 (02/25 0620) BP: (136-192)/(61-83) 174/82 (02/25 0620) SpO2:  [90 %-96 %] 90 % (02/25 0726) Weight:  [84.4 kg] 84.4 kg (02/24 1048)  Intake/Output from previous day: 02/24 0701 - 02/25 0700 In: 1278 [P.O.:240; I.V.:1038] Out: 2075 [Urine:2050; Blood:25] Intake/Output this shift: No intake/output data recorded.  Neurologic: Grossly normal  Lab Results: Lab Results  Component Value Date   WBC 12.9 (H) 11/25/2018   HGB 11.6 (L) 11/25/2018   HCT 37.5 11/25/2018   MCV 88.4 11/25/2018   PLT 347 11/25/2018   Lab Results  Component Value Date   INR 1.03 11/25/2018   BMET Lab Results  Component Value Date   NA 138 11/25/2018   K 3.8 11/25/2018   CL 106 11/25/2018   CO2 23 11/25/2018   GLUCOSE 114 (H) 11/25/2018   BUN 18 11/25/2018   CREATININE 1.51 (H) 11/25/2018   CALCIUM 9.4 11/25/2018    Studies/Results: Dg Lumbar Spine 2-3 Views  Result Date: 11/29/2018 CLINICAL DATA:  83 year old female 4 T11-12 laminectomy in order to resect intradural extramedullary mass. Subsequent encounter. EXAM: DG C-ARM 61-120 MIN; LUMBAR SPINE - 2-3 VIEW Fluoroscopic time: 7 seconds. COMPARISON:  11/10/2018 MR. FINDINGS: Two intraoperative C-arm views submitted for review after surgery. Utilizing T12 anterior wedge compression deformity as a marker, metallic spreaders on lateral view located posterior to the mid to lower T11 level and upper aspect of the T11-12 disc space. Frontal view reveals metallic structure which appears to be grasping the T11 spinous process. IMPRESSION: Localization T11 vertebra and upper aspect of the T11-12 disc space as noted above. Electronically Signed   By: Lacy Duverney M.D.   On:  11/29/2018 13:11   Dg C-arm 1-60 Min  Result Date: 11/29/2018 CLINICAL DATA:  82 year old female 4 T11-12 laminectomy in order to resect intradural extramedullary mass. Subsequent encounter. EXAM: DG C-ARM 61-120 MIN; LUMBAR SPINE - 2-3 VIEW Fluoroscopic time: 7 seconds. COMPARISON:  11/10/2018 MR. FINDINGS: Two intraoperative C-arm views submitted for review after surgery. Utilizing T12 anterior wedge compression deformity as a marker, metallic spreaders on lateral view located posterior to the mid to lower T11 level and upper aspect of the T11-12 disc space. Frontal view reveals metallic structure which appears to be grasping the T11 spinous process. IMPRESSION: Localization T11 vertebra and upper aspect of the T11-12 disc space as noted above. Electronically Signed   By: Lacy Duverney M.D.   On: 11/29/2018 13:11    Assessment/Plan: Out of bed and ambulate today, dc foley. Incision is CDI.    LOS: 1 day    Deanna Acosta [facility] 11/30/2018, 8:18 AM

## 2018-11-30 NOTE — Progress Notes (Signed)
RT Note:  Patient has Advair at bedside. Has taken the pm dose. Dulera held. Patient will resume Dulera for future dosages and put away her Advair.

## 2018-11-30 NOTE — Evaluation (Signed)
Physical Therapy Evaluation Patient Details Name: Deanna Acosta MRN: [id number] DOB: [birth year] Today's Date: 11/30/2018   History of Present Illness  Pt is an 83yo female found to have intradural exra medullary mass at T11-12. Pt admitte for tumor removal 2/24. PMH: depression, anxiety, arthritis, GERD, COPD PSH: bilat TKA  Clinical Impression  Patient is s/p above surgery resulting in the deficits listed below (see PT Problem List). Pt progressing well with minimal pain however very SOB with amb and SPO2 dec to 80% on 1LO2 via [place]. RN made aware. Patient will benefit from skilled PT to increase their independence and safety with mobility (while adhering to their precautions) to allow discharge to the venue listed below.     Follow Up Recommendations Home health PT;Supervision - Intermittent(may progress and not need it)    Equipment Recommendations  Rolling walker with 5" wheels    Recommendations for Other Services       Precautions / Restrictions Precautions Precautions: Back Precaution Booklet Issued: No Precaution Comments: discussed back precautions to aide in pain management Restrictions Weight Bearing Restrictions: No      Mobility  Bed Mobility               General bed mobility comments: pt up in chair upon PT arrival, discussed using logroll technique for transferring in/out of bed  Transfers Overall transfer level: Needs assistance Equipment used: Rolling walker (2 wheeled) Transfers: Sit to/from Stand Sit to Stand: Min guard         General transfer comment: verbal cues to push up from arm rests not pull up on RW, no physical assist  Ambulation/Gait Ambulation/Gait assistance: Min guard Gait Distance (Feet): 150 Feet Assistive device: Rolling walker (2 wheeled) Gait Pattern/deviations: Step-through pattern;Decreased stride length Gait velocity: dec   General Gait Details: SOB, pt SpO2 dec to 80% on 1lO2 via [place], returned to 91 s/p 2 min rest  break while stll on 1Lo2 via [place]  Stairs            Wheelchair Mobility    Modified Rankin (Stroke Patients Only)       Balance Overall balance assessment: Needs assistance Sitting-balance support: Feet supported;No upper extremity supported Sitting balance-Leahy Scale: Fair     Standing balance support: No upper extremity supported Standing balance-Leahy Scale: Fair Standing balance comment: pt requires RW for energy conservation and balance for long distance ambulation                             Pertinent Vitals/Pain Pain Assessment: 0-10 Pain Score: 2  Pain Location: surgical site Pain Descriptors / Indicators: Sore    Home Living Family/patient expects to be discharged to:: Private residence Living Arrangements: Spouse/significant other Available Help at Discharge: Family;Available 24 hours/day Type of Home: House Home Access: Stairs to enter Entrance Stairs-Rails: Right Entrance Stairs-Number of Steps: 4-5 Home Layout: One level Home Equipment: Walker - 2 wheels;Shower seat;Tub bench;Toilet riser      Prior Function Level of Independence: Independent               Hand Dominance   Dominant Hand: Right    Extremity/Trunk Assessment   Upper Extremity Assessment Upper Extremity Assessment: Generalized weakness(pain onset in upper back with MMT)    Lower Extremity Assessment Lower Extremity Assessment: Generalized weakness    Cervical / Trunk Assessment Cervical / Trunk Assessment: Other exceptions Cervical / Trunk Exceptions: recent back surgery  Communication   Communication:  No difficulties  Cognition Arousal/Alertness: Awake/alert Behavior During Therapy: WFL for tasks assessed/performed Overall Cognitive Status: Within Functional Limits for tasks assessed                                        General Comments General comments (skin integrity, edema, etc.): pt no typically on O2, pt SpO2 91% at rest on  1lo2 via [place], dropped to 80% on 1lO2 via [place] during amb    Exercises     Assessment/Plan    PT Assessment Patient needs continued PT services  PT Problem List Decreased strength;Decreased range of motion;Decreased activity tolerance;Decreased balance;Decreased mobility;Decreased coordination;Decreased cognition;Decreased knowledge of use of DME       PT Treatment Interventions DME instruction;Gait training;Stair training;Functional mobility training;Therapeutic activities;Therapeutic exercise;Balance training;Neuromuscular re-education    PT Goals (Current goals can be found in the Care Plan section)  Acute Rehab PT Goals Patient Stated Goal: home PT Goal Formulation: With patient Time For Goal Achievement: 12/14/18 Potential to Achieve Goals: Good    Frequency Min 5X/week   Barriers to discharge        Co-evaluation               AM-PAC PT "6 Clicks" Mobility  Outcome Measure Help needed turning from your back to your side while in a flat bed without using bedrails?: A Little Help needed moving from lying on your back to sitting on the side of a flat bed without using bedrails?: A Little Help needed moving to and from a bed to a chair (including a wheelchair)?: A Little Help needed standing up from a chair using your arms (e.g., wheelchair or bedside chair)?: A Little Help needed to walk in hospital room?: A Little Help needed climbing 3-5 steps with a railing? : A Little 6 Click Score: 18    End of Session Equipment Utilized During Treatment: Gait belt Activity Tolerance: Patient tolerated treatment well Patient left: in chair;with call bell/phone within reach;with family/visitor present Nurse Communication: Mobility status PT Visit Diagnosis: Unsteadiness on feet (R26.81)    Time: 4801-6553 PT Time Calculation (min) (ACUTE ONLY): 18 min   Charges:   PT Evaluation $PT Eval Low Complexity: 1 Low          Lewis Shock, PT, DPT Acute Rehabilitation  Services Pager #: [phone] Office #: [phone]   Iona Hansen 11/30/2018, 12:42 PM

## 2018-12-01 ENCOUNTER — Inpatient Hospital Stay (HOSPITAL_COMMUNITY): Payer: Medicare Other

## 2018-12-01 LAB — GLUCOSE, CAPILLARY: Glucose-Capillary: 121 mg/dL — ABNORMAL HIGH (ref 70–99)

## 2018-12-01 NOTE — Progress Notes (Signed)
Physical Therapy Treatment Patient Details Name: Deanna Acosta MRN: [id number] DOB: [birth year] Today's Date: 12/01/2018    History of Present Illness Pt is an 83yo female found to have intradural exra medullary mass at T11-12. Pt admitte for tumor removal 2/24. PMH: depression, anxiety, arthritis, GERD, COPD PSH: bilat TKA    PT Comments    Pt mobilizing well however SpO2 drops significantly and pt has SOB with all activity. Pt functioning at supervision level and has minimal pain. Pt safe to d/c home with assist of spouse and RW once medically stable.   SATURATION QUALIFICATIONS: (This note is used to comply with regulatory documentation for home oxygen)  Patient Saturations on Room Air at Rest = 87%  Patient Saturations on Room Air while Ambulating = 78%  Patient Saturations on 3 Liters of oxygen while Ambulating = 82%  Please briefly explain why patient needs home oxygen: unable to maintain >88% on RA   Follow Up Recommendations  Home health PT;Supervision - Intermittent     Equipment Recommendations  Rolling walker with 5" wheels(i believe she has a RW)    Recommendations for Other Services       Precautions / Restrictions Precautions Precautions: Back Precaution Booklet Issued: No Precaution Comments: discussed back precautions to aide in pain management Restrictions Weight Bearing Restrictions: No    Mobility  Bed Mobility Overal bed mobility: Modified Independent             General bed mobility comments: discussed log rolling to minimize pain, no physical assist needed  Transfers Overall transfer level: Needs assistance Equipment used: Rolling walker (2 wheeled) Transfers: Sit to/from Stand Sit to Stand: Min guard         General transfer comment: verbal cues to push up from bed and to minimize trunk flexion  Ambulation/Gait Ambulation/Gait assistance: Min guard Gait Distance (Feet): 200 Feet Assistive device: Rolling walker (2  wheeled) Gait Pattern/deviations: Step-through pattern Gait velocity: dec Gait velocity interpretation: 1.31 - 2.62 ft/sec, indicative of limited community ambulator General Gait Details: SOB, SpO2 dec to 78% on 1 LO2 via [place] [employer] to 3lO2 via [place] SPO2 in low 80s   Stairs Stairs: Yes Stairs assistance: Min guard Stair Management: One rail Right;Alternating pattern;Forwards Number of Stairs: 3 General stair comments: cautious but able to complete without difficulty   Wheelchair Mobility    Modified Rankin (Stroke Patients Only)       Balance Overall balance assessment: Needs assistance Sitting-balance support: No upper extremity supported Sitting balance-Leahy Scale: Good Sitting balance - Comments: pt able to bring foot to knee to pull up socks   Standing balance support: No upper extremity supported Standing balance-Leahy Scale: Fair                              Cognition Arousal/Alertness: Awake/alert Behavior During Therapy: WFL for tasks assessed/performed Overall Cognitive Status: Within Functional Limits for tasks assessed                                        Exercises      General Comments General comments (skin integrity, edema, etc.): watch SpO2      Pertinent Vitals/Pain Pain Assessment: 0-10 Pain Score: 2  Pain Location: surigical site Pain Descriptors / Indicators: Sore    Home Living  Prior Function            PT Goals (current goals can now be found in the care plan section) Acute Rehab PT Goals Patient Stated Goal: home today Progress towards PT goals: Progressing toward goals    Frequency    Min 5X/week      PT Plan Current plan remains appropriate    Co-evaluation              AM-PAC PT "6 Clicks" Mobility   Outcome Measure  Help needed turning from your back to your side while in a flat bed without using bedrails?: A Little Help needed moving from lying on your  back to sitting on the side of a flat bed without using bedrails?: A Little Help needed moving to and from a bed to a chair (including a wheelchair)?: A Little Help needed standing up from a chair using your arms (e.g., wheelchair or bedside chair)?: A Little Help needed to walk in hospital room?: A Little Help needed climbing 3-5 steps with a railing? : A Little 6 Click Score: 18    End of Session Equipment Utilized During Treatment: Gait belt Activity Tolerance: Patient tolerated treatment well Patient left: in chair;with call bell/phone within reach;with family/visitor present Nurse Communication: Mobility status PT Visit Diagnosis: Unsteadiness on feet (R26.81)     Time: 0626-9485 PT Time Calculation (min) (ACUTE ONLY): 13 min  Charges:  $Gait Training: 8-22 mins                     Lewis Shock, PT, DPT Acute Rehabilitation Services Pager #: [phone] Office #: [phone]    Iona Hansen 12/01/2018, 11:03 AM

## 2018-12-01 NOTE — Progress Notes (Signed)
Patient ID: Deanna Acosta, female   DOB: January 23, 1936, 83 y.o.   MRN: [id number] Subjective: Patient reports her pain is well controlled, no headache, no SOB or CP  Objective: Vital signs in last 24 hours: Temp:  [97.5 F (36.4 C)-99.5 F (37.5 C)] 97.5 F (36.4 C) (02/26 1121) Pulse Rate:  [81-86] 84 (02/26 1121) Resp:  [16] 16 (02/26 1121) BP: (115-164)/(47-73) 137/62 (02/26 1121) SpO2:  [90 %-93 %] 92 % (02/26 1121)  Intake/Output from previous day: 02/25 0701 - 02/26 0700 In: 600 [P.O.:600] Out: 2150 [Urine:2150] Intake/Output this shift: No intake/output data recorded.  Neurologic: Grossly normal  Lab Results: Lab Results  Component Value Date   WBC 12.9 (H) 11/25/2018   HGB 11.6 (L) 11/25/2018   HCT 37.5 11/25/2018   MCV 88.4 11/25/2018   PLT 347 11/25/2018   Lab Results  Component Value Date   INR 1.03 11/25/2018   BMET Lab Results  Component Value Date   NA 138 11/25/2018   K 3.8 11/25/2018   CL 106 11/25/2018   CO2 23 11/25/2018   GLUCOSE 114 (H) 11/25/2018   BUN 18 11/25/2018   CREATININE 1.51 (H) 11/25/2018   CALCIUM 9.4 11/25/2018    Studies/Results: Dg Lumbar Spine 2-3 Views  Result Date: 11/29/2018 CLINICAL DATA:  83 year old female 4 T11-12 laminectomy in order to resect intradural extramedullary mass. Subsequent encounter. EXAM: DG C-ARM 61-120 MIN; LUMBAR SPINE - 2-3 VIEW Fluoroscopic time: 7 seconds. COMPARISON:  11/10/2018 MR. FINDINGS: Two intraoperative C-arm views submitted for review after surgery. Utilizing T12 anterior wedge compression deformity as a marker, metallic spreaders on lateral view located posterior to the mid to lower T11 level and upper aspect of the T11-12 disc space. Frontal view reveals metallic structure which appears to be grasping the T11 spinous process. IMPRESSION: Localization T11 vertebra and upper aspect of the T11-12 disc space as noted above. Electronically Signed   By: Lacy Duverney M.D.   On: 11/29/2018 13:11    Dg C-arm 1-60 Min  Result Date: 11/29/2018 CLINICAL DATA:  83 year old female 4 T11-12 laminectomy in order to resect intradural extramedullary mass. Subsequent encounter. EXAM: DG C-ARM 61-120 MIN; LUMBAR SPINE - 2-3 VIEW Fluoroscopic time: 7 seconds. COMPARISON:  11/10/2018 MR. FINDINGS: Two intraoperative C-arm views submitted for review after surgery. Utilizing T12 anterior wedge compression deformity as a marker, metallic spreaders on lateral view located posterior to the mid to lower T11 level and upper aspect of the T11-12 disc space. Frontal view reveals metallic structure which appears to be grasping the T11 spinous process. IMPRESSION: Localization T11 vertebra and upper aspect of the T11-12 disc space as noted above. Electronically Signed   By: Lacy Duverney M.D.   On: 11/29/2018 13:11    Assessment/Plan: Doing well, sats sag when walking but this isn;t new for her, no CP, highly doubt PE, will check CXR, mobilize  Estimated body mass index is 31.43 kg/m as calculated from the following:   Height as of this encounter: 5' 4.5" (1.638 m).   Weight as of this encounter: 84.4 kg.    LOS: 2 days    Tia Alert 12/01/2018, 11:58 AM

## 2018-12-01 NOTE — Evaluation (Signed)
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 Pt admitte for tumor removal 2/24. PMH: depression, anxiety, arthritis, GERD, COPD PSH: bilat TKA   Clinical Impression   Pt PTA: living with spouse and reports having DME from prior TKAs. Pt currently limited by minguardA for balance. Pt able to perform UB/LB ADLs with figure four technique with no pain reported. Pt verbalized agreement to back precautions and plans to have spouse assist as needed. Pt minguardA for transfers and ADL functional mobility. Pt fair balance at sink for ADL. Pt does not require continued OT services. OT signing off.    Follow Up Recommendations  No OT follow up;Supervision - Intermittent    Equipment Recommendations  None recommended by OT    Recommendations for Other Services       Precautions / Restrictions Precautions Precautions: Back Precaution Booklet Issued: Yes (comment) Precaution Comments: disussion of pain management and verbally discussed booklet for ADL Restrictions Weight Bearing Restrictions: No      Mobility Bed Mobility Overal bed mobility: Modified Independent             General bed mobility comments: performed log rolling  Transfers Overall transfer level: Needs assistance Equipment used: Rolling walker (2 wheeled) Transfers: Sit to/from Stand Sit to Stand: Min guard         General transfer comment: verbal cues to push up from bed and to minimize trunk flexion    Balance Overall balance assessment: Needs assistance Sitting-balance support: No upper extremity supported Sitting balance-Leahy Scale: Good Sitting balance - Comments: pt able to bring foot to knee to pull up socks   Standing balance support: No upper extremity supported Standing balance-Leahy Scale: Fair                              ADL either performed or assessed with clinical judgement   ADL Overall ADL's : At baseline                                       General ADL Comments: Pt able to perform LB dressing/bathing/ADL with figure 4 technique and pt has DME and AE at home to assist. Pt able to donn/doff pants and top with no difficulty abiding by back precautions     Vision Baseline Vision/History: No visual deficits Vision Assessment?: No apparent visual deficits     Perception     Praxis      Pertinent Vitals/Pain Pain Assessment: 0-10 Pain Score: 2  Pain Location: surgical site Pain Descriptors / Indicators: Sore     Hand Dominance Right   Extremity/Trunk Assessment Upper Extremity Assessment Upper Extremity Assessment: Generalized weakness   Lower Extremity Assessment Lower Extremity Assessment: Generalized weakness;Defer to PT evaluation   Cervical / Trunk Assessment Cervical / Trunk Assessment: Other exceptions Cervical / Trunk Exceptions: recent back surgery   Communication Communication Communication: No difficulties   Cognition Arousal/Alertness: Awake/alert Behavior During Therapy: WFL for tasks assessed/performed Overall Cognitive Status: Within Functional Limits for tasks assessed  General Comments  O2 desats on RA to 80s requiring 2L O2 to regain >90%    Exercises     Shoulder Instructions      Home Living Family/patient expects to be discharged to:: Private residence Living Arrangements: Spouse/significant other Available Help at Discharge: Family;Available 24 hours/day Type of Home: House Home Access: Stairs to enter [employer] of Steps: 4-5 Entrance Stairs-Rails: Right Home Layout: One level     Bathroom Shower/Tub: Chief Strategy Officer: Handicapped height     Home Equipment: Environmental consultant - 2 wheels;Shower seat;Tub bench;Toilet riser           Prior Functioning/Environment Level of Independence: Independent                 OT Problem List: Impaired balance (sitting and/or standing)      OT Treatment/Interventions:      OT Goals(Current goals can be found in the care plan section) Acute Rehab OT Goals Patient Stated Goal: home today  OT Frequency:     Barriers to D/C:            Co-evaluation              AM-PAC OT "6 Clicks" Daily Activity     Outcome Measure Help from another person eating meals?: None Help from another person taking care of personal grooming?: None Help from another person toileting, which includes using toliet, bedpan, or urinal?: None Help from another person bathing (including washing, rinsing, drying)?: A Little Help from another person to put on and taking off regular upper body clothing?: None Help from another person to put on and taking off regular lower body clothing?: A Little 6 Click Score: 22   End of Session Equipment Utilized During Treatment: Rolling walker Nurse Communication: Mobility status  Activity Tolerance: Patient tolerated treatment well Patient left: in chair;with call bell/phone within reach;with family/visitor present;with nursing/sitter in room  OT Visit Diagnosis: Unsteadiness on feet (R26.81);Muscle weakness (generalized) (M62.81)                Time: 9211-9417 OT Time Calculation (min): 33 min Charges:  OT General Charges $OT Visit: 1 Visit OT Evaluation $OT Eval Moderate Complexity: 1 Mod OT Treatments $Self Care/Home Management : 8-22 mins  Revonda Standard Cecil Cranker) Glendell Docker OTR/L Acute Rehabilitation Services Pager: [phone] Office: [phone]  Sandrea Hughs 12/01/2018, 11:25 AM

## 2018-12-01 NOTE — Care Management Note (Addendum)
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 Pt admitte for tumor removal 2/24.  PTA, pt independent, lives at home with spouse.                   Action/Plan: PT recommending HH follow up, and pt agreeable to services.  She states she has RW at home.  Referral to [facility], per pt choice, as she has used in the past.  No DME needs.  Husband able to provide 24h care at dc.  Pt does qualify for home oxygen with drop in O2 sats with ambulation, and chronic respiratory dx of COPD.  Please leave orders for home oxygen if you would like to arrange home O2 at dc.    Expected Discharge Date:                  Expected Discharge Plan:  Home w Home Health Services  In-House Referral:     Discharge planning Services  CM Consult  Post Acute Care Choice:  Home Health Choice offered to:  Patient  DME Arranged:    DME Agency:     HH Arranged:  PT HH Agency:  [facility] (now [facility])  Status of Service:  In process, will continue to follow  If discussed at Long Length of Stay Meetings, dates discussed:    Additional Comments:  Quintella Baton, RN, BSN  Trauma/Neuro ICU Case Manager [phone]

## 2018-12-02 MED ORDER — MOMETASONE FURO-FORMOTEROL FUM 200-5 MCG/ACT IN AERO: 2.0000 | INHALATION_SPRAY | Freq: Two times a day (BID) | RESPIRATORY_TRACT | 5 refills | Status: DC

## 2018-12-02 NOTE — Progress Notes (Signed)
PT Cancellation Note  Patient Details Name: Deanna Acosta MRN: [id number] DOB: Feb 18, 1936   Cancelled Treatment:    Reason Eval/Treat Not Completed: Other (comment); patient dressed for d/c today.  Reviewed back precautions and educated on safety with car transfers, but pt not wanting to mobilize since planned d/c soon.  Educated to make note of questions/concerns as they arise for HHPT to address.  Will defer per pt request.    Elray Mcgregor 12/02/2018, 9:51 AM  Sheran Lawless, PT Acute Rehabilitation Services [phone] 12/02/2018

## 2018-12-02 NOTE — Care Management Note (Signed)
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 Pt admitte for tumor removal 2/24.  PTA, pt independent, lives at home with spouse.                   Action/Plan: PT recommending HH follow up, and pt agreeable to services.  She states she has RW at home.  Referral to [facility], per pt choice, as she has used in the past.  No DME needs.  Husband able to provide 24h care at dc.  Pt does qualify for home oxygen with drop in O2 sats with ambulation, and chronic respiratory dx of COPD.  Please leave orders for home oxygen if you would like to arrange home O2 at dc.    Expected Discharge Date:  12/02/18               Expected Discharge Plan:  Home w Home Health Services  In-House Referral:     Discharge planning Services  CM Consult  Post Acute Care Choice:  Home Health Choice offered to:  Patient  DME Arranged:  Oxygen DME Agency:  [facility]  HH Arranged:  PT, OT [facility] Agency:  [facility] (now [facility])  Status of Service:  Completed, signed off  If discussed at Long Length of Stay Meetings, dates discussed:    Additional Comments:  12/02/2018 J. Corry Storie, RN, BSN Pt medically stable for [employer] home today with husband and [facility] services as arranged.  Pt will need home oxygen as anticipated; referral to [facility] for home oxygen set up.  Portable O2 tank to be delivered to pt's room prior to dc home.    Quintella Baton, RN, BSN  Trauma/Neuro ICU Case Manager [phone]

## 2018-12-02 NOTE — Discharge Summary (Signed)
Physician Discharge Summary  Patient ID: Deanna Acosta MRN: [id number] DOB/AGE: [birth year] 83 y.o. Estimated body mass index is 31.43 kg/m as calculated from the following:   Height as of this encounter: 5' 4.5" (1.638 m).   Weight as of this encounter: 84.4 kg.   Admit date: 11/29/2018 Discharge date: 12/02/2018  Admission Diagnoses:intradural intramedullary tumor  Discharge Diagnoses: same Active Problems:   S/P lumbar laminectomy   Discharged Condition: good  Hospital Course: patient is medical Hospital underwent laminectomy for resection of intradural extra medullary tumor. Postoperative patient did very well [employer] in the floor on the floor was angling and voiding spontaneously after being kept flat for 24-48 hours. On ambulation patient was noted to have a drop in her O2 saturations that apparently is a chronic problem for her. Workup chest x-ray and metabolic workup was negative patient continue to improve clinically. Patient will be stable for discharge with home health as well as home oxygen. I will also order respiratory care. Patient was scheduled follow-up in one to 2 weeks Dr. Yetta Barre.  Consults: Significant Diagnostic Studies: Treatments:thoracic laminectomy for resection of intradural extramedullary tumor Discharge Exam: Blood pressure (!) 152/64, pulse 78, temperature 98.9 F (37.2 C), temperature source Oral, resp. rate 17, height 5' 4.5" (1.638 m), weight 84.4 kg, SpO2 94 %. Strength out of 5 wound clean dry and intact  Disposition: home  Discharge Instructions    Face-to-face encounter (required for Medicare/Medicaid patients)   Complete by:  As directed    I Laverne Klugh P certify that this patient is under my care and that I, or a nurse practitioner or physician's assistant working with me, had a face-to-face encounter that meets the physician face-to-face encounter requirements with this patient on 12/02/2018. The encounter with the patient was in whole, or  in part for the following medical condition(s) which is the primary reason for home health care (List medical condition): lumbar intradural tumor and oxygen dependence   The encounter with the patient was in whole, or in part, for the following medical condition, which is the primary reason for home health care:  lumbar intradural tumor and oxygen dependence   I certify that, based on my findings, the following services are medically necessary home health services:  Physical therapy   Reason for Medically Necessary Home Health Services:  Therapy- Instruction on Safe use of Assistive Devices for ADLs   My clinical findings support the need for the above services:  Shortness of breath with activity   Further, I certify that my clinical findings support that this patient is homebound due to:  Unsafe ambulation due to balance issues   For home use only DME oxygen   Complete by:  As directed    Frequency:  Continuous (stationary and portable oxygen unit needed)   Oxygen delivery system:  Gas   Home Health   Complete by:  As directed    To provide the following care/treatments:   PT OT Respiratory Care       Allergies as of 12/02/2018      Reactions   Cortisone Other (See Comments)   headaches   Penicillins Hives, Other (See Comments)   Has patient had a PCN reaction causing immediate rash, facial/tongue/throat swelling, SOB or lightheadedness with hypotension: Yes Has patient had a PCN reaction causing severe rash involving mucus membranes or skin necrosis: No Has patient had a PCN reaction that required hospitalization No Has patient had a PCN reaction occurring within the last 10 years: No If  all of the above answers are "NO", then may proceed with Cephalosporin use.   Losartan Diarrhea, Nausea And Vomiting   Other Nausea And Vomiting, Other (See Comments)   Fluid pills give pt cramps   Levofloxacin Rash      Medication List    TAKE these medications   albuterol 108 (90 Base) MCG/ACT  inhaler Commonly known as:  PROVENTIL HFA;VENTOLIN HFA Inhale 2 puffs into the lungs every 6 (six) hours as needed for wheezing or shortness of breath.   aspirin 325 MG tablet Take 325 mg by mouth daily as needed for moderate pain.   aspirin 81 MG chewable tablet Chew 1 tablet (81 mg total) by mouth 2 (two) times daily.   cholecalciferol 25 MCG (1000 UT) tablet Commonly known as:  VITAMIN D Take 1,000 Units by mouth daily.   diltiazem 120 MG 24 hr capsule Commonly known as:  CARDIZEM CD Take 120 mg by mouth daily.   FLUoxetine 40 MG capsule Commonly known as:  PROZAC Take 40 mg by mouth daily before breakfast.   fluticasone 50 MCG/ACT nasal spray Commonly known as:  FLONASE Place 1 spray into both nostrils 2 (two) times daily as needed for allergies.   Fluticasone-Salmeterol 250-50 MCG/DOSE Aepb Commonly known as:  ADVAIR Inhale 1 puff into the lungs daily as needed (wheezing or shortness of breath).   hydrochlorothiazide 25 MG tablet Commonly known as:  HYDRODIURIL Take 25 mg by mouth daily.   losartan 100 MG tablet Commonly known as:  COZAAR Take 100 mg by mouth daily.   mometasone-formoterol 200-5 MCG/ACT Aero Commonly known as:  DULERA Inhale 2 puffs into the lungs 2 (two) times daily.   omeprazole 40 MG capsule Commonly known as:  PRILOSEC Take 40 mg by mouth daily.   oxyCODONE 5 MG immediate release tablet Commonly known as:  ROXICODONE Take 1-2 tablets (5-10 mg total) by mouth every 4 (four) hours as needed for severe pain.   polyethylene glycol powder powder Commonly known as:  GLYCOLAX/MIRALAX Take 17 g by mouth daily.   senna-docusate 8.6-50 MG tablet Commonly known as:  Senokot-S Take 1 tablet by mouth 2 (two) times daily.   traMADol 50 MG tablet Commonly known as:  ULTRAM Take 50 mg by mouth daily as needed (when bowling).            Durable Medical Equipment  (From admission, onward)         Start     Ordered   12/02/18 0000  For  home use only DME oxygen    Question Answer Comment  Frequency Continuous (stationary and portable oxygen unit needed)   Oxygen delivery system Gas      12/02/18 0807           Signed: Jene Huq P 12/02/2018, 8:07 AM

## 2018-12-02 NOTE — Progress Notes (Signed)
IV removed, discharge instructions reviewed with patient and husband.  Transport patient via wheelchair home with husband.

## 2018-12-03 ENCOUNTER — Emergency Department (HOSPITAL_COMMUNITY)
Admission: EM | Admit: 2018-12-03 | Discharge: 2018-12-03 | Disposition: A | Discharge status: Home / Self Care | Payer: Medicare Other | Attending: Emergency Medicine | Admitting: Emergency Medicine

## 2018-12-03 ENCOUNTER — Other Ambulatory Visit: Payer: Self-pay

## 2018-12-03 ENCOUNTER — Emergency Department (HOSPITAL_COMMUNITY): Payer: Medicare Other

## 2018-12-03 DIAGNOSIS — I129 Hypertensive chronic kidney disease with stage 1 through stage 4 chronic kidney disease, or unspecified chronic kidney disease: Secondary | ICD-10-CM | POA: Insufficient documentation

## 2018-12-03 DIAGNOSIS — F419 Anxiety disorder, unspecified: Secondary | ICD-10-CM | POA: Diagnosis not present

## 2018-12-03 DIAGNOSIS — Z7982 Long term (current) use of aspirin: Secondary | ICD-10-CM | POA: Insufficient documentation

## 2018-12-03 DIAGNOSIS — R1084 Generalized abdominal pain: Secondary | ICD-10-CM | POA: Diagnosis not present

## 2018-12-03 DIAGNOSIS — Z96653 Presence of artificial knee joint, bilateral: Secondary | ICD-10-CM | POA: Insufficient documentation

## 2018-12-03 DIAGNOSIS — Z79899 Other long term (current) drug therapy: Secondary | ICD-10-CM | POA: Insufficient documentation

## 2018-12-03 DIAGNOSIS — F329 Major depressive disorder, single episode, unspecified: Secondary | ICD-10-CM | POA: Diagnosis not present

## 2018-12-03 DIAGNOSIS — R079 Chest pain, unspecified: Secondary | ICD-10-CM | POA: Diagnosis present

## 2018-12-03 DIAGNOSIS — J181 Lobar pneumonia, unspecified organism: Secondary | ICD-10-CM

## 2018-12-03 DIAGNOSIS — J449 Chronic obstructive pulmonary disease, unspecified: Secondary | ICD-10-CM | POA: Diagnosis not present

## 2018-12-03 DIAGNOSIS — Z9049 Acquired absence of other specified parts of digestive tract: Secondary | ICD-10-CM | POA: Diagnosis not present

## 2018-12-03 DIAGNOSIS — N183 Chronic kidney disease, stage 3 (moderate): Secondary | ICD-10-CM | POA: Diagnosis not present

## 2018-12-03 DIAGNOSIS — Z87891 Personal history of nicotine dependence: Secondary | ICD-10-CM | POA: Insufficient documentation

## 2018-12-03 DIAGNOSIS — J189 Pneumonia, unspecified organism: Secondary | ICD-10-CM

## 2018-12-03 DIAGNOSIS — J45909 Unspecified asthma, uncomplicated: Secondary | ICD-10-CM | POA: Diagnosis not present

## 2018-12-03 LAB — CBC
HCT: 36.5 % (ref 36.0–46.0)
Hemoglobin: 11.6 g/dL — ABNORMAL LOW (ref 12.0–15.0)
MCH: 27.8 pg (ref 26.0–34.0)
MCHC: 31.8 g/dL (ref 30.0–36.0)
MCV: 87.5 fL (ref 80.0–100.0)
Platelets: 324 10*3/uL (ref 150–400)
RBC: 4.17 MIL/uL (ref 3.87–5.11)
RDW: 14.2 % (ref 11.5–15.5)
WBC: 16.7 10*3/uL — ABNORMAL HIGH (ref 4.0–10.5)
nRBC: 0 % (ref 0.0–0.2)

## 2018-12-03 LAB — BASIC METABOLIC PANEL
Anion gap: 9 (ref 5–15)
BUN: 15 mg/dL (ref 8–23)
CO2: 26 mmol/L (ref 22–32)
Calcium: 10.4 mg/dL — ABNORMAL HIGH (ref 8.9–10.3)
Chloride: 101 mmol/L (ref 98–111)
Creatinine, Ser: 1.17 mg/dL — ABNORMAL HIGH (ref 0.44–1.00)
GFR calc Af Amer: 51 mL/min — ABNORMAL LOW (ref >60)
GFR calc non Af Amer: 44 mL/min — ABNORMAL LOW (ref >60)
Glucose, Bld: 117 mg/dL — ABNORMAL HIGH (ref 70–99)
Potassium: 3.9 mmol/L (ref 3.5–5.1)
Sodium: 136 mmol/L (ref 135–145)

## 2018-12-03 LAB — I-STAT TROPONIN, ED
Troponin i, poc: 0.02 ng/mL (ref 0.00–0.08)
Troponin i, poc: 0.01 ng/mL (ref 0.00–0.08)

## 2018-12-03 MED ORDER — DOXYCYCLINE HYCLATE 100 MG PO TABS
100.0000 mg | ORAL_TABLET | Freq: Once | ORAL | Status: AC
  Administered 2018-12-03: 100 mg via ORAL
  Filled 2018-12-03: qty 1

## 2018-12-03 MED ORDER — IOPAMIDOL (ISOVUE-370) INJECTION 76%
INTRAVENOUS | Status: AC
  Filled 2018-12-03: qty 50

## 2018-12-03 MED ORDER — IOPAMIDOL (ISOVUE-370) INJECTION 76%
80.0000 mL | Freq: Once | INTRAVENOUS | Status: AC | PRN
  Administered 2018-12-03: 80 mL via INTRAVENOUS

## 2018-12-03 MED ORDER — OXYCODONE-ACETAMINOPHEN 5-325 MG PO TABS
1.0000 | ORAL_TABLET | Freq: Once | ORAL | Status: AC
  Administered 2018-12-03: 1 via ORAL
  Filled 2018-12-03: qty 1

## 2018-12-03 MED ORDER — DOXYCYCLINE HYCLATE 100 MG PO CAPS: 100.0000 mg | ORAL_CAPSULE | Freq: Two times a day (BID) | ORAL | 0 refills | Status: DC

## 2018-12-03 MED ORDER — OXYCODONE-ACETAMINOPHEN 5-325 MG PO TABS: 1.0000 | ORAL_TABLET | ORAL | 0 refills | Status: DC | PRN

## 2018-12-03 MED ORDER — SODIUM CHLORIDE 0.9 % IV BOLUS
500.0000 mL | Freq: Once | INTRAVENOUS | Status: AC
  Administered 2018-12-03: 500 mL via INTRAVENOUS

## 2018-12-03 NOTE — ED Notes (Signed)
Patient verbalizes understanding of discharge instructions. Opportunity for questioning and answers were provided. Armband removed by staff, pt discharged from ED.  
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
## 2018-12-03 NOTE — ED Triage Notes (Signed)
Per pt she has been having some chest pain since yesterday and lower abdomen that radiates to her back and started getting worse through the night.Pt stated she is having SOB and is on O2 AT 2L.   Pt has had a poor appetite since yesterday. Pt is alert oriented x 4.

## 2018-12-03 NOTE — Discharge Instructions (Signed)
You will need a repeat chest CT in 4-6 to make sure pneumonia is gone.

## 2018-12-03 NOTE — ED Notes (Signed)
Patient transported to CT 

## 2018-12-03 NOTE — ED Provider Notes (Signed)
Pt signed out by Dr. Blinda Leatherwood pending CT results and 2nd troponin.  2nd troponin is normal.  CT:  IMPRESSION:  1. Negative for a pulmonary embolism.  2. Patchy interstitial and airspace densities in the posterior right  upper lobe and right lower lobe superior segment. Findings are  concerning for atelectasis and/or pneumonia. In addition, there is  an indeterminate 1.6 cm nodular density in the left lower lobe  superior segment. This nodular density could represent a focus of  infection but a neoplastic process cannot be completely excluded and  warrants follow-up. This nodular density is new from the examination  in 2019. New lymphadenopathy in the right infrahilar region.  Recommend follow-up chest CT with IV contrast in 4-6 weeks to  evaluate the lymphadenopathy and indeterminate nodular lesion in the  left lower lobe.  3. No acute abnormality in the abdomen or pelvis.  4. Expected postoperative changes from recent T11 surgery.  5. Moderate sized hiatal hernia with chronic dilatation of the  esophagus.  6. Aortic Atherosclerosis (ICD10-I70.0) and Emphysema (ICD10-J43.9).    These results were called by telephone at the time of interpretation  on 12/03/2018 at 8:34 am to Dr. Particia Nearing, who verbally acknowledged  these results.      Pt was sent home from the hospital on oxygen, so she has that at home.  O2 sats 96% on 2L She will be started on doxy BID and given her first dose here.  She is allergic to pcn and levaquin.  Her daughter said she was d/c home without any pain meds.  I could not find that they were prescribed, so I also sent her home with percocet 5/325 (#15).  I told them to call Dr. Yetta Barre (NS) if she needs more.  Pt also told to repeat CT in 4-6 wks to ensure pna is gone.  She knows to f/u with her pcp and to return if worse.   Jacalyn Lefevre, MD 12/03/18 [phone]

## 2018-12-20 ENCOUNTER — Other Ambulatory Visit: Payer: Self-pay | Admitting: Family Medicine

## 2018-12-20 DIAGNOSIS — R911 Solitary pulmonary nodule: Secondary | ICD-10-CM

## 2019-01-25 ENCOUNTER — Other Ambulatory Visit: Payer: Medicare Other

## 2019-02-21 ENCOUNTER — Other Ambulatory Visit: Payer: Medicare Other

## 2019-03-08 ENCOUNTER — Ambulatory Visit
Admission: RE | Admit: 2019-03-08 | Discharge: 2019-03-08 | Disposition: A | Discharge status: Home / Self Care | Payer: Medicare Other | Source: Ambulatory Visit | Attending: Family Medicine | Admitting: Family Medicine

## 2019-03-08 DIAGNOSIS — R911 Solitary pulmonary nodule: Secondary | ICD-10-CM

## 2019-03-08 MED ORDER — IOPAMIDOL (ISOVUE-300) INJECTION 61%
60.0000 mL | Freq: Once | INTRAVENOUS | Status: AC | PRN
  Administered 2019-03-08: 60 mL via INTRAVENOUS

## 2019-03-16 ENCOUNTER — Ambulatory Visit
Admission: RE | Admit: 2019-03-16 | Discharge: 2019-03-16 | Disposition: A | Discharge status: Home / Self Care | Payer: Medicare Other | Source: Ambulatory Visit | Attending: Family Medicine | Admitting: Family Medicine

## 2019-03-16 ENCOUNTER — Other Ambulatory Visit: Payer: Self-pay

## 2019-03-16 DIAGNOSIS — Z1231 Encounter for screening mammogram for malignant neoplasm of breast: Secondary | ICD-10-CM

## 2019-03-16 DIAGNOSIS — M810 Age-related osteoporosis without current pathological fracture: Secondary | ICD-10-CM

## 2019-08-05 ENCOUNTER — Encounter (HOSPITAL_COMMUNITY): Payer: Self-pay

## 2019-08-05 ENCOUNTER — Ambulatory Visit (HOSPITAL_COMMUNITY)
Admission: EM | Admit: 2019-08-05 | Discharge: 2019-08-05 | Disposition: A | Discharge status: Home / Self Care | Payer: Medicare Other | Attending: Emergency Medicine | Admitting: Emergency Medicine

## 2019-08-05 ENCOUNTER — Other Ambulatory Visit: Payer: Self-pay

## 2019-08-05 DIAGNOSIS — K1370 Unspecified lesions of oral mucosa: Secondary | ICD-10-CM | POA: Diagnosis not present

## 2019-08-05 MED ORDER — MAGIC MOUTHWASH W/LIDOCAINE: 10.0000 mL | Freq: Three times a day (TID) | ORAL | 0 refills | Status: DC | PRN

## 2019-08-05 MED ORDER — NYSTATIN 100000 UNIT/ML MT SUSP: 500000.0000 [IU] | Freq: Four times a day (QID) | OROMUCOSAL | 0 refills | Status: DC
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
## 2019-08-05 NOTE — Discharge Instructions (Addendum)
You were seen by [facility] ear nose throat earlier this month.  I would call them back to be referred to Dr. Sharol Roussel at [facility].  He is a specialist who specializes in mouth sores.  In the meantime, I am going to treat you for thrush with nystatin swish and swallow.  The Magic mouthwash is for pain.  Do not swallow this.

## 2019-08-05 NOTE — ED Triage Notes (Signed)
Pt states she is having painful sores in her mouth x 1 month aprox . Pt states she may have thrush.

## 2019-08-19 ENCOUNTER — Emergency Department (HOSPITAL_COMMUNITY)
Admission: EM | Admit: 2019-08-19 | Discharge: 2019-08-19 | Disposition: A | Discharge status: Home / Self Care | Payer: Medicare Other | Attending: Emergency Medicine | Admitting: Emergency Medicine

## 2019-08-19 ENCOUNTER — Other Ambulatory Visit: Payer: Self-pay

## 2019-08-19 DIAGNOSIS — K1370 Unspecified lesions of oral mucosa: Secondary | ICD-10-CM | POA: Diagnosis present

## 2019-08-19 DIAGNOSIS — Z5321 Procedure and treatment not carried out due to patient leaving prior to being seen by health care provider: Secondary | ICD-10-CM | POA: Insufficient documentation

## 2019-08-19 NOTE — ED Triage Notes (Signed)
PT states painful sores continue in her mouth for months now unrelieved by mouth rinses perscribed at urgent care. PT states she has an appt 10/09/2018 with a dermatologist but can't get in sooner and wants to be seen again.

## 2019-09-08 ENCOUNTER — Encounter (HOSPITAL_COMMUNITY): Payer: Self-pay | Admitting: Emergency Medicine

## 2019-09-08 ENCOUNTER — Emergency Department (HOSPITAL_COMMUNITY)
Admission: EM | Admit: 2019-09-08 | Discharge: 2019-09-08 | Disposition: A | Discharge status: Home / Self Care | Payer: Medicare Other | Attending: Emergency Medicine | Admitting: Emergency Medicine

## 2019-09-08 ENCOUNTER — Other Ambulatory Visit: Payer: Self-pay

## 2019-09-08 DIAGNOSIS — N183 Chronic kidney disease, stage 3 unspecified: Secondary | ICD-10-CM | POA: Insufficient documentation

## 2019-09-08 DIAGNOSIS — J449 Chronic obstructive pulmonary disease, unspecified: Secondary | ICD-10-CM | POA: Diagnosis not present

## 2019-09-08 DIAGNOSIS — K1379 Other lesions of oral mucosa: Secondary | ICD-10-CM

## 2019-09-08 DIAGNOSIS — Z7982 Long term (current) use of aspirin: Secondary | ICD-10-CM | POA: Insufficient documentation

## 2019-09-08 DIAGNOSIS — Z87891 Personal history of nicotine dependence: Secondary | ICD-10-CM | POA: Diagnosis not present

## 2019-09-08 DIAGNOSIS — Z96651 Presence of right artificial knee joint: Secondary | ICD-10-CM | POA: Insufficient documentation

## 2019-09-08 DIAGNOSIS — J45909 Unspecified asthma, uncomplicated: Secondary | ICD-10-CM | POA: Diagnosis not present

## 2019-09-08 DIAGNOSIS — Z20828 Contact with and (suspected) exposure to other viral communicable diseases: Secondary | ICD-10-CM | POA: Diagnosis not present

## 2019-09-08 DIAGNOSIS — Z79899 Other long term (current) drug therapy: Secondary | ICD-10-CM | POA: Diagnosis not present

## 2019-09-08 DIAGNOSIS — I129 Hypertensive chronic kidney disease with stage 1 through stage 4 chronic kidney disease, or unspecified chronic kidney disease: Secondary | ICD-10-CM | POA: Insufficient documentation

## 2019-09-08 LAB — SARS CORONAVIRUS 2 (TAT 6-24 HRS): SARS Coronavirus 2: NEGATIVE

## 2019-09-08 MED ORDER — MAGIC MOUTHWASH W/LIDOCAINE: 10.0000 mL | Freq: Three times a day (TID) | ORAL | 0 refills | Status: DC | PRN

## 2019-09-08 NOTE — Discharge Instructions (Signed)
As discussed, today's evaluation has been largely reassuring. Is important that you keep your scheduled follow-up visit, monitor your condition carefully, and do not hesitate to return here if you develop new, or concerning changes.
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
## 2019-09-08 NOTE — ED Triage Notes (Signed)
Pt c/o sores in mouth for 3 months.  States she has been seen for same and medication has not helped.  Appt with dermatologist on 1/4.

## 2019-10-31 ENCOUNTER — Ambulatory Visit: Payer: Medicare Other | Attending: Internal Medicine

## 2019-10-31 DIAGNOSIS — Z23 Encounter for immunization: Secondary | ICD-10-CM | POA: Insufficient documentation

## 2019-10-31 NOTE — Progress Notes (Signed)
   Covid-19 Vaccination Clinic  Name:  ABUK SELLECK    MRN: [id number] DOB: [birth year]  10/31/2019  Ms. Stoffel was observed post Covid-19 immunization for 15 minutes without incidence. She was provided with Vaccine Information Sheet and instruction to access the V-Safe system.   Ms. Whiteman was instructed to call 911 with any severe reactions post vaccine: Marland Kitchen Difficulty breathing  . Swelling of your face and throat  . A fast heartbeat  . A bad rash all over your body  . Dizziness and weakness    Immunizations Administered    Name Date Dose VIS Date Route   Pfizer COVID-19 Vaccine 10/31/2019  9:34 AM 0.3 mL 09/16/2019 Intramuscular   Manufacturer: [employer], [employer]   Lot: [id number]   NDC: [address]-2

## 2019-11-21 ENCOUNTER — Ambulatory Visit: Payer: Medicare Other | Attending: Internal Medicine

## 2019-11-21 DIAGNOSIS — Z23 Encounter for immunization: Secondary | ICD-10-CM | POA: Insufficient documentation

## 2019-11-21 NOTE — Progress Notes (Signed)
   Covid-19 Vaccination Clinic  Name:  DANNE SCARDINA    MRN: [id number] DOB: 08-22-1936  11/21/2019  Ms. Caniglia was observed post Covid-19 immunization for 15 minutes without incidence. She was provided with Vaccine Information Sheet and instruction to access the V-Safe system.   Ms. Lasseter was instructed to call 911 with any severe reactions post vaccine: Marland Kitchen Difficulty breathing  . Swelling of your face and throat  . A fast heartbeat  . A bad rash all over your body  . Dizziness and weakness    Immunizations Administered    Name Date Dose VIS Date Route   Pfizer COVID-19 Vaccine 11/21/2019 10:03 AM 0.3 mL 09/16/2019 Intramuscular   Manufacturer: [employer], [employer]   Lot: [id number]   NDC: [address]-3

## 2020-03-07 ENCOUNTER — Other Ambulatory Visit: Payer: Self-pay | Admitting: Family Medicine

## 2020-03-07 DIAGNOSIS — Z1231 Encounter for screening mammogram for malignant neoplasm of breast: Secondary | ICD-10-CM

## 2020-03-16 ENCOUNTER — Other Ambulatory Visit: Payer: Self-pay

## 2020-03-16 ENCOUNTER — Ambulatory Visit
Admission: RE | Admit: 2020-03-16 | Discharge: 2020-03-16 | Disposition: A | Discharge status: Home / Self Care | Payer: Medicare Other | Source: Ambulatory Visit | Attending: Family Medicine | Admitting: Family Medicine

## 2020-03-16 DIAGNOSIS — Z1231 Encounter for screening mammogram for malignant neoplasm of breast: Secondary | ICD-10-CM

## 2020-07-21 ENCOUNTER — Ambulatory Visit: Payer: Medicare Other | Attending: Internal Medicine

## 2020-07-21 DIAGNOSIS — Z23 Encounter for immunization: Secondary | ICD-10-CM

## 2020-07-21 NOTE — Progress Notes (Signed)
   Covid-19 Vaccination Clinic  Name:  Deanna Acosta    MRN: [id number] DOB: [birth year]  07/21/2020  Deanna Acosta was observed post Covid-19 immunization for 15 minutes without incident. She was provided with Vaccine Information Sheet and instruction to access the V-Safe system.   Deanna Acosta was instructed to call 911 with any severe reactions post vaccine: Marland Kitchen Difficulty breathing  . Swelling of face and throat  . A fast heartbeat  . A bad rash all over body  . Dizziness and weakness

## 2020-10-19 ENCOUNTER — Other Ambulatory Visit: Payer: Medicare Other

## 2020-10-19 DIAGNOSIS — Z20822 Contact with and (suspected) exposure to covid-19: Secondary | ICD-10-CM

## 2020-10-21 LAB — SARS-COV-2, NAA 2 DAY TAT

## 2020-10-21 LAB — NOVEL CORONAVIRUS, NAA: SARS-CoV-2, NAA: NOT DETECTED

## 2021-01-22 ENCOUNTER — Observation Stay (HOSPITAL_COMMUNITY)
Admission: EM | Admit: 2021-01-22 | Discharge: 2021-01-23 | Disposition: A | Discharge status: Home / Self Care | Payer: Medicare Other | Attending: Internal Medicine | Admitting: Internal Medicine

## 2021-01-22 ENCOUNTER — Encounter (HOSPITAL_COMMUNITY): Payer: Self-pay | Admitting: Emergency Medicine

## 2021-01-22 DIAGNOSIS — Z98818 Other dental procedure status: Secondary | ICD-10-CM | POA: Insufficient documentation

## 2021-01-22 DIAGNOSIS — I129 Hypertensive chronic kidney disease with stage 1 through stage 4 chronic kidney disease, or unspecified chronic kidney disease: Secondary | ICD-10-CM | POA: Diagnosis not present

## 2021-01-22 DIAGNOSIS — Z96653 Presence of artificial knee joint, bilateral: Secondary | ICD-10-CM | POA: Diagnosis not present

## 2021-01-22 DIAGNOSIS — E860 Dehydration: Secondary | ICD-10-CM | POA: Insufficient documentation

## 2021-01-22 DIAGNOSIS — Z20822 Contact with and (suspected) exposure to covid-19: Secondary | ICD-10-CM | POA: Diagnosis not present

## 2021-01-22 DIAGNOSIS — J45909 Unspecified asthma, uncomplicated: Secondary | ICD-10-CM | POA: Insufficient documentation

## 2021-01-22 DIAGNOSIS — N1832 Chronic kidney disease, stage 3b: Secondary | ICD-10-CM | POA: Insufficient documentation

## 2021-01-22 DIAGNOSIS — K0889 Other specified disorders of teeth and supporting structures: Secondary | ICD-10-CM | POA: Insufficient documentation

## 2021-01-22 DIAGNOSIS — Z87891 Personal history of nicotine dependence: Secondary | ICD-10-CM | POA: Insufficient documentation

## 2021-01-22 DIAGNOSIS — N179 Acute kidney failure, unspecified: Secondary | ICD-10-CM | POA: Diagnosis not present

## 2021-01-22 DIAGNOSIS — Z79899 Other long term (current) drug therapy: Secondary | ICD-10-CM | POA: Insufficient documentation

## 2021-01-22 DIAGNOSIS — K08409 Partial loss of teeth, unspecified cause, unspecified class: Secondary | ICD-10-CM

## 2021-01-22 DIAGNOSIS — J449 Chronic obstructive pulmonary disease, unspecified: Secondary | ICD-10-CM | POA: Insufficient documentation

## 2021-01-22 DIAGNOSIS — R42 Dizziness and giddiness: Secondary | ICD-10-CM | POA: Diagnosis present

## 2021-01-22 DIAGNOSIS — N183 Chronic kidney disease, stage 3 unspecified: Secondary | ICD-10-CM | POA: Diagnosis present

## 2021-01-22 DIAGNOSIS — Z7982 Long term (current) use of aspirin: Secondary | ICD-10-CM | POA: Insufficient documentation

## 2021-01-22 DIAGNOSIS — I10 Essential (primary) hypertension: Secondary | ICD-10-CM | POA: Diagnosis present

## 2021-01-22 DIAGNOSIS — K2100 Gastro-esophageal reflux disease with esophagitis, without bleeding: Secondary | ICD-10-CM | POA: Diagnosis present

## 2021-01-22 LAB — CBC WITH DIFFERENTIAL/PLATELET
Abs Immature Granulocytes: 0.19 10*3/uL — ABNORMAL HIGH (ref 0.00–0.07)
Basophils Absolute: 0 10*3/uL (ref 0.0–0.1)
Basophils Relative: 0 %
Eosinophils Absolute: 0 10*3/uL (ref 0.0–0.5)
Eosinophils Relative: 0 %
HCT: 42 % (ref 36.0–46.0)
Hemoglobin: 13.4 g/dL (ref 12.0–15.0)
Immature Granulocytes: 1 %
Lymphocytes Relative: 5 %
Lymphs Abs: 1 10*3/uL (ref 0.7–4.0)
MCH: 29.2 pg (ref 26.0–34.0)
MCHC: 31.9 g/dL (ref 30.0–36.0)
MCV: 91.5 fL (ref 80.0–100.0)
Monocytes Absolute: 0.6 10*3/uL (ref 0.1–1.0)
Monocytes Relative: 3 %
Neutro Abs: 16.5 10*3/uL — ABNORMAL HIGH (ref 1.7–7.7)
Neutrophils Relative %: 91 %
Platelets: 449 10*3/uL — ABNORMAL HIGH (ref 150–400)
RBC: 4.59 MIL/uL (ref 3.87–5.11)
RDW: 14.5 % (ref 11.5–15.5)
WBC: 18.3 10*3/uL — ABNORMAL HIGH (ref 4.0–10.5)
nRBC: 0 % (ref 0.0–0.2)

## 2021-01-22 LAB — COMPREHENSIVE METABOLIC PANEL
ALT: 9 U/L (ref 0–44)
AST: 12 U/L — ABNORMAL LOW (ref 15–41)
Albumin: 3.5 g/dL (ref 3.5–5.0)
Alkaline Phosphatase: 78 U/L (ref 38–126)
Anion gap: 11 (ref 5–15)
BUN: 88 mg/dL — ABNORMAL HIGH (ref 8–23)
CO2: 17 mmol/L — ABNORMAL LOW (ref 22–32)
Calcium: 9.4 mg/dL (ref 8.9–10.3)
Chloride: 110 mmol/L (ref 98–111)
Creatinine, Ser: 3.3 mg/dL — ABNORMAL HIGH (ref 0.44–1.00)
GFR, Estimated: 13 mL/min — ABNORMAL LOW (ref >60)
Glucose, Bld: 133 mg/dL — ABNORMAL HIGH (ref 70–99)
Potassium: 5.3 mmol/L — ABNORMAL HIGH (ref 3.5–5.1)
Sodium: 138 mmol/L (ref 135–145)
Total Bilirubin: 0.4 mg/dL (ref 0.3–1.2)
Total Protein: 7.2 g/dL (ref 6.5–8.1)

## 2021-01-22 LAB — SARS CORONAVIRUS 2 (TAT 6-24 HRS): SARS Coronavirus 2: NEGATIVE

## 2021-01-22 MED ORDER — SODIUM CHLORIDE 0.9 % IV SOLN: INTRAVENOUS | Status: DC

## 2021-01-22 MED ORDER — ACETAMINOPHEN 650 MG RE SUPP: 650.0000 mg | Freq: Four times a day (QID) | RECTAL | Status: DC | PRN

## 2021-01-22 MED ORDER — CLINDAMYCIN HCL 300 MG PO CAPS
300.0000 mg | ORAL_CAPSULE | Freq: Three times a day (TID) | ORAL | Status: DC
  Administered 2021-01-22 – 2021-01-23 (×2): 300 mg via ORAL
  Filled 2021-01-22 (×2): qty 1

## 2021-01-22 MED ORDER — HEPARIN SODIUM (PORCINE) 5000 UNIT/ML IJ SOLN
5000.0000 [IU] | Freq: Three times a day (TID) | INTRAMUSCULAR | Status: DC
  Administered 2021-01-22 – 2021-01-23 (×2): 5000 [IU] via SUBCUTANEOUS
  Filled 2021-01-22 (×2): qty 1

## 2021-01-22 MED ORDER — ACETAMINOPHEN 325 MG PO TABS: 650.0000 mg | ORAL_TABLET | Freq: Four times a day (QID) | ORAL | Status: DC | PRN

## 2021-01-22 MED ORDER — ALBUTEROL SULFATE HFA 108 (90 BASE) MCG/ACT IN AERS
2.0000 | INHALATION_SPRAY | Freq: Four times a day (QID) | RESPIRATORY_TRACT | Status: DC | PRN
  Filled 2021-01-22: qty 6.7

## 2021-01-22 MED ORDER — CLINDAMYCIN PHOSPHATE 300 MG/50ML IV SOLN
300.0000 mg | Freq: Once | INTRAVENOUS | Status: AC
  Administered 2021-01-22: 300 mg via INTRAVENOUS
  Filled 2021-01-22: qty 50

## 2021-01-22 MED ORDER — MAGIC MOUTHWASH W/LIDOCAINE
5.0000 mL | Freq: Four times a day (QID) | ORAL | Status: DC | PRN
  Filled 2021-01-22: qty 5

## 2021-01-22 MED ORDER — LACTATED RINGERS IV BOLUS
500.0000 mL | Freq: Once | INTRAVENOUS | Status: AC
  Administered 2021-01-22: 500 mL via INTRAVENOUS
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
## 2021-01-22 NOTE — ED Notes (Signed)
Attempted to call report

## 2021-01-22 NOTE — ED Triage Notes (Signed)
Emergency Medicine Provider Triage Evaluation Note  Deanna Acosta , a 85 y.o. female  was evaluated in triage.  Pt complains of mouth pain after dental surgery 2 weeks ago. States that since then feels light headed and as if she is going to pass out. Has not fallen. No CP. Some SOB. Unsure why she is documented hypotension. BP here 119/87  Review of Systems  Positive: Hypotension.  Negative: CP, sob  Physical Exam  There were no vitals taken for this visit. Gen:   Awake, no distress   HEENT:  Facial pain on R side.  Resp:  Normal effort  Cardiac:  Normal rate  Abd:   Nondistended, nontender  MSK:   Moves extremities without difficulty  Neuro:  Speech clear   Medical Decision Making  Medically screening exam initiated at 2:04 PM.  Appropriate orders placed.  HARBOR PASTER was informed that the remainder of the evaluation will be completed by another provider, this initial triage assessment does not replace that evaluation, and the importance of remaining in the ED until their evaluation is complete.  Clinical Impression  Stable BP 119/67  MSE was initiated and I personally evaluated the patient and placed orders (if any) at  2:05 PM on January 22, 2021.  The patient appears stable so that the remainder of the MSE may be completed by another provider.    Farrel Gordon, PA-C 01/22/21 1410

## 2021-01-22 NOTE — ED Triage Notes (Signed)
Pt here from home with mouth pain after having her teeth pulled , pt does have some small sores on her gums
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
## 2021-01-23 DIAGNOSIS — N179 Acute kidney failure, unspecified: Secondary | ICD-10-CM | POA: Diagnosis not present

## 2021-01-23 LAB — CBC
HCT: 37.3 % (ref 36.0–46.0)
Hemoglobin: 11.9 g/dL — ABNORMAL LOW (ref 12.0–15.0)
MCH: 28.7 pg (ref 26.0–34.0)
MCHC: 31.9 g/dL (ref 30.0–36.0)
MCV: 90.1 fL (ref 80.0–100.0)
Platelets: 304 10*3/uL (ref 150–400)
RBC: 4.14 MIL/uL (ref 3.87–5.11)
RDW: 14.3 % (ref 11.5–15.5)
WBC: 12.8 10*3/uL — ABNORMAL HIGH (ref 4.0–10.5)
nRBC: 0 % (ref 0.0–0.2)

## 2021-01-23 LAB — BASIC METABOLIC PANEL
Anion gap: 9 (ref 5–15)
BUN: 71 mg/dL — ABNORMAL HIGH (ref 8–23)
CO2: 17 mmol/L — ABNORMAL LOW (ref 22–32)
Calcium: 9.1 mg/dL (ref 8.9–10.3)
Chloride: 112 mmol/L — ABNORMAL HIGH (ref 98–111)
Creatinine, Ser: 2.36 mg/dL — ABNORMAL HIGH (ref 0.44–1.00)
GFR, Estimated: 20 mL/min — ABNORMAL LOW (ref >60)
Glucose, Bld: 106 mg/dL — ABNORMAL HIGH (ref 70–99)
Potassium: 4.1 mmol/L (ref 3.5–5.1)
Sodium: 138 mmol/L (ref 135–145)

## 2021-01-23 NOTE — Discharge Summary (Signed)
Physician Discharge Summary  Deanna Acosta KGM:[id number] DOB: 03/22/1936 DOA: 01/22/2021  PCP: Sigmund HazelMiller, Lisa, MD  Admit date: 01/22/2021 Discharge date: 01/23/2021  Admitted From: Home Disposition: Home  Recommendations for Outpatient Follow-up:  1. Follow up with PCP in 1-2 weeks 2. Please obtain BMP/CBC in one week 3. Schedule follow-up with your dental surgeon.  Home Health: Not applicable Equipment/Devices: Not applicable  Discharge Condition: Stable CODE STATUS: Full code Diet recommendation: Soft diet.  Plenty of liquids.  Low-salt diet.  Discharge summary: 85 year old female with history of COPD, hypertension, CKD stage IIIb with baseline creatinine about 1.6 presented to the emergency room after 2 weeks of dental extraction with ongoing mouth pain and soreness, dizziness and lightheadedness at home.  Patient was keeping up with her fluid but had difficulty eating because of mild soreness.  No fever at home.  She continued to take her blood pressure medication including losartan and hydrochlorothiazide. In the emergency room, hemodynamically stable.  WBC count 18,000.  BUN/creatinine 88/3.3 with recent known baseline of 32/1.66.  Patient was given 1 L of IV fluids in the ER and was advised observation in the hospital for continuous need of IV fluid.  Received 1 L isotonic solution in the ER, received normal saline 125 mL/h overnight.  Symptoms improved.  Walking to the bathroom.  Urinating well.  No more dizziness lightheadedness on mobility.  Assessment plan: Acute kidney injury due to prerenal failure secondary to poor oral intake and ongoing use of losartan and hydrochlorothiazide. Clinically improved.  Asymptomatic today.  Renal functions trending towards her baseline. BUN/creatinine 71/2.36 today.  Patient is willing to go home as he is able to keep up oral hydration. Resume diltiazem. Advised to stop taking losartan and hydrochlorothiazide for next 1 to 2 weeks to  follow-up with her primary care physician for repeat BMP and blood pressure monitoring.  Can resume these medications once she gets back to her usual levels.  She has some gingival pain but no evidence of abscess, open drainage or swelling.  She had completed 1 week of antibiotic through her dental surgeon.  Was given clindamycin doses in the hospital.  Do not see any need for ongoing use of antibiotics.  She will talk to her Designer, industrial/productdental surgeon.  Husband at the bedside.  Eager to go home today since early morning.  Will discharge with suggested outpatient follow-up.   Discharge Diagnoses:  Principal Problem:   AKI (acute kidney injury) (HCC) Active Problems:   HTN (hypertension)   Chronic kidney disease, stage III (moderate) (HCC)   Gastroesophageal reflux disease with esophagitis    Discharge Instructions  Discharge Instructions    Call MD for:  persistant dizziness or light-headedness   Complete by: As directed    Diet - low sodium heart healthy   Complete by: As directed    Discharge instructions   Complete by: As directed    Hold on taking water pill HCTZ and losartan until repeat lab test   Increase activity slowly   Complete by: As directed      Allergies as of 01/23/2021      Reactions   Cortisone Other (See Comments)   Headaches   Penicillins Hives, Other (See Comments)   Has patient had a PCN reaction causing immediate rash, facial/tongue/throat swelling, SOB or lightheadedness with hypotension: Yes Has patient had a PCN reaction causing severe rash involving mucus membranes or skin necrosis: No Has patient had a PCN reaction that required hospitalization No Has patient had a  PCN reaction occurring within the last 10 years: No If all of the above answers are "NO", then may proceed with Cephalosporin use.   Losartan Diarrhea, Nausea And Vomiting   Other Nausea And Vomiting, Other (See Comments)   "Fluid pills" cause cramps   Tape Other (See Comments)   SKIN IS VERY  THIN AND BRUISES AND TEARS VERY EASILY!!!!!   Levofloxacin Rash      Medication List    TAKE these medications   albuterol 108 (90 Base) MCG/ACT inhaler Commonly known as: VENTOLIN HFA Inhale 2 puffs into the lungs every 6 (six) hours as needed for wheezing or shortness of breath.   aspirin 325 MG tablet Take 325 mg by mouth daily. What changed: Another medication with the same name was removed. Continue taking this medication, and follow the directions you see here.   cholecalciferol 25 MCG (1000 UNIT) tablet Commonly known as: VITAMIN D Take 1,000 Units by mouth daily.   diltiazem 120 MG 24 hr capsule Commonly known as: CARDIZEM CD Take 120 mg by mouth daily.   FLUoxetine 40 MG capsule Commonly known as: PROZAC Take 40 mg by mouth daily before breakfast.   fluticasone 50 MCG/ACT nasal spray Commonly known as: FLONASE Place 1 spray into both nostrils 2 (two) times daily as needed for allergies.   Fluticasone-Salmeterol 250-50 MCG/DOSE Aepb Commonly known as: ADVAIR Inhale 1 puff into the lungs daily as needed (wheezing or shortness of breath).   hydrochlorothiazide 25 MG tablet Commonly known as: HYDRODIURIL Take 25 mg by mouth daily.   losartan 100 MG tablet Commonly known as: COZAAR Take 100 mg by mouth daily.   magic mouthwash w/lidocaine Soln Take 10 mLs by mouth 3 (three) times daily as needed for mouth pain. Swish and spit do not swallow   mometasone-formoterol 200-5 MCG/ACT Aero Commonly known as: DULERA Inhale 2 puffs into the lungs 2 (two) times daily.   nystatin 100000 UNIT/ML suspension Commonly known as: MYCOSTATIN Take 5 mLs (500,000 Units total) by mouth 4 (four) times daily. Swish and swallow x 7-14 days. Retain in mouth as long as possible   omeprazole 40 MG capsule Commonly known as: PRILOSEC Take 40 mg by mouth daily.   OXYGEN Inhale 2 L into the lungs as needed (for shortness of breath with exertion).   polyethylene glycol powder 17  GM/SCOOP powder Commonly known as: GLYCOLAX/MIRALAX Take 17 g by mouth daily.   senna-docusate 8.6-50 MG tablet Commonly known as: Senokot-S Take 1 tablet by mouth 2 (two) times daily.   traMADol 50 MG tablet Commonly known as: ULTRAM Take 50 mg by mouth daily as needed (when bowling).       Follow-up Information    Sigmund Hazel, MD Follow up in 1 week(s).   Specialty: Family Medicine Why: repeat BMP in one week Contact information: [address] [place] [address] [phone]              Allergies  Allergen Reactions  . Cortisone Other (See Comments)    Headaches   . Penicillins Hives and Other (See Comments)    Has patient had a PCN reaction causing immediate rash, facial/tongue/throat swelling, SOB or lightheadedness with hypotension: Yes Has patient had a PCN reaction causing severe rash involving mucus membranes or skin necrosis: No Has patient had a PCN reaction that required hospitalization No Has patient had a PCN reaction occurring within the last 10 years: No If all of the above answers are "NO", then may proceed  with Cephalosporin use.   . Losartan Diarrhea and Nausea And Vomiting  . Other Nausea And Vomiting and Other (See Comments)    "Fluid pills" cause cramps  . Tape Other (See Comments)    SKIN IS VERY THIN AND BRUISES AND TEARS VERY EASILY!!!!!  . Levofloxacin Rash    Consultations:  None   Procedures/Studies: No results found. (Echo, Carotid, EGD, Colonoscopy, ERCP)    Subjective: Patient seen and examined.  Husband at the bedside.  Eager to go home.  Denies any complaints today.  Walked to the bathroom and did not get any dizziness or lightheadedness.   Discharge Exam: Vitals:   01/23/21 0134 01/23/21 0544  BP: (!) 165/53 (!) 150/68  Pulse: 65 60  Resp: 20 20  Temp: (!) 97.5 F (36.4 C) 97.6 F (36.4 C)  SpO2: 100% 97%   Vitals:   01/22/21 2032 01/22/21 2035 01/23/21 0134 01/23/21 0544  BP: 133/65 133/65 (!) 165/53  (!) 150/68  Pulse: 68 68 65 60  Resp: 17 17 20 20   Temp:  97.7 F (36.5 C) (!) 97.5 F (36.4 C) 97.6 F (36.4 C)  TempSrc:  Oral Oral Oral  SpO2:  99% 100% 97%    General: Pt is alert, awake, not in acute distress Cardiovascular: RRR, S1/S2 +, no rubs, no gallops Respiratory: CTA bilaterally, no wheezing, no rhonchi Abdominal: Soft, NT, ND, bowel sounds + Extremities: no edema, no cyanosis    The results of significant diagnostics from this hospitalization (including imaging, microbiology, ancillary and laboratory) are listed below for reference.     Microbiology: Recent Results (from the past 240 hour(s))  SARS CORONAVIRUS 2 (TAT 6-24 HRS) Nasopharyngeal Nasopharyngeal Swab     Status: None   Collection Time: 01/22/21  4:22 PM   Specimen: Nasopharyngeal Swab  Result Value Ref Range Status   SARS Coronavirus 2 NEGATIVE NEGATIVE Final    Comment: (NOTE) SARS-CoV-2 target nucleic acids are NOT DETECTED.  The SARS-CoV-2 RNA is generally detectable in upper and lower respiratory specimens during the acute phase of infection. Negative results do not preclude SARS-CoV-2 infection, do not rule out co-infections with other pathogens, and should not be used as the sole basis for treatment or other patient management decisions. Negative results must be combined with clinical observations, patient history, and epidemiological information. The expected result is Negative.  Fact Sheet for Patients: 01/24/21  Fact Sheet for Healthcare Providers: HairSlick.no  This test is not yet approved or cleared by the [place] FDA and  has been authorized for detection and/or diagnosis of SARS-CoV-2 by FDA under an Emergency Use Authorization (EUA). This EUA will remain  in effect (meaning this test can be used) for the duration of the COVID-19 declaration under Se ction 564(b)(1) of the Act, 21 U.S.C. section  360bbb-3(b)(1), unless the authorization is terminated or revoked sooner.  Performed at [facility], [address] [place], [place]      Labs: BNP (last 3 results) No results for input(s): BNP in the last 8760 hours. Basic Metabolic Panel: Recent Labs  Lab 01/22/21 1420 01/23/21 0217  NA 138 138  K 5.3* 4.1  CL 110 112*  CO2 17* 17*  GLUCOSE 133* 106*  BUN 88* 71*  CREATININE 3.30* 2.36*  CALCIUM 9.4 9.1   Liver Function Tests: Recent Labs  Lab 01/22/21 1420  AST 12*  ALT 9  ALKPHOS 78  BILITOT 0.4  PROT 7.2  ALBUMIN 3.5   No results for  input(s): LIPASE, AMYLASE in the last 168 hours. No results for input(s): AMMONIA in the last 168 hours. CBC: Recent Labs  Lab 01/22/21 1420 01/23/21 0217  WBC 18.3* 12.8*  NEUTROABS 16.5*  --   HGB 13.4 11.9*  HCT 42.0 37.3  MCV 91.5 90.1  PLT 449* 304   Cardiac Enzymes: No results for input(s): CKTOTAL, CKMB, CKMBINDEX, TROPONINI in the last 168 hours. BNP: Invalid input(s): POCBNP CBG: No results for input(s): GLUCAP in the last 168 hours. D-Dimer No results for input(s): DDIMER in the last 72 hours. Hgb A1c No results for input(s): HGBA1C in the last 72 hours. Lipid Profile No results for input(s): CHOL, HDL, LDLCALC, TRIG, CHOLHDL, LDLDIRECT in the last 72 hours. Thyroid function studies No results for input(s): TSH, T4TOTAL, T3FREE, THYROIDAB in the last 72 hours.  Invalid input(s): FREET3 Anemia work up No results for input(s): VITAMINB12, FOLATE, FERRITIN, TIBC, IRON, RETICCTPCT in the last 72 hours. Urinalysis    Component Value Date/Time   COLORURINE YELLOW 10/11/2016 2104   APPEARANCEUR CLEAR 10/11/2016 2104   LABSPEC 1.020 10/11/2016 2104   PHURINE 5.0 10/11/2016 2104   GLUCOSEU NEGATIVE 10/11/2016 2104   HGBUR NEGATIVE 10/11/2016 2104   BILIRUBINUR NEGATIVE 10/11/2016 2104   KETONESUR NEGATIVE 10/11/2016 2104   PROTEINUR NEGATIVE 10/11/2016 2104   UROBILINOGEN 1.0  06/21/2013 1816   NITRITE NEGATIVE 10/11/2016 2104   LEUKOCYTESUR NEGATIVE 10/11/2016 2104   Sepsis Labs Invalid input(s): PROCALCITONIN,  WBC,  LACTICIDVEN Microbiology Recent Results (from the past 240 hour(s))  SARS CORONAVIRUS 2 (TAT 6-24 HRS) Nasopharyngeal Nasopharyngeal Swab     Status: None   Collection Time: 01/22/21  4:22 PM   Specimen: Nasopharyngeal Swab  Result Value Ref Range Status   SARS Coronavirus 2 NEGATIVE NEGATIVE Final    Comment: (NOTE) SARS-CoV-2 target nucleic acids are NOT DETECTED.  The SARS-CoV-2 RNA is generally detectable in upper and lower respiratory specimens during the acute phase of infection. Negative results do not preclude SARS-CoV-2 infection, do not rule out co-infections with other pathogens, and should not be used as the sole basis for treatment or other patient management decisions. Negative results must be combined with clinical observations, patient history, and epidemiological information. The expected result is Negative.  Fact Sheet for Patients: HairSlick.no  Fact Sheet for Healthcare Providers: quierodirigir.com  This test is not yet approved or cleared by the [place] FDA and  has been authorized for detection and/or diagnosis of SARS-CoV-2 by FDA under an Emergency Use Authorization (EUA). This EUA will remain  in effect (meaning this test can be used) for the duration of the COVID-19 declaration under Se ction 564(b)(1) of the Act, 21 U.S.C. section 360bbb-3(b)(1), unless the authorization is terminated or revoked sooner.  Performed at [facility], [address] [place], [place] [address]      Time coordinating discharge: 32 minutes  SIGNED:   Dorcas Carrow, MD  [facility] 01/23/2021, 10:32 AM

## 2021-02-11 ENCOUNTER — Other Ambulatory Visit: Payer: Self-pay | Admitting: Family Medicine

## 2021-02-11 DIAGNOSIS — Z1231 Encounter for screening mammogram for malignant neoplasm of breast: Secondary | ICD-10-CM

## 2021-02-14 ENCOUNTER — Other Ambulatory Visit (HOSPITAL_COMMUNITY): Payer: Self-pay | Admitting: Family Medicine

## 2021-02-14 ENCOUNTER — Ambulatory Visit (HOSPITAL_COMMUNITY)
Admission: RE | Admit: 2021-02-14 | Discharge: 2021-02-14 | Disposition: A | Discharge status: Home / Self Care | Payer: Medicare Other | Source: Ambulatory Visit | Attending: Family Medicine | Admitting: Family Medicine

## 2021-02-14 ENCOUNTER — Other Ambulatory Visit: Payer: Self-pay

## 2021-02-14 DIAGNOSIS — M79604 Pain in right leg: Secondary | ICD-10-CM | POA: Diagnosis not present

## 2021-02-14 DIAGNOSIS — M7989 Other specified soft tissue disorders: Secondary | ICD-10-CM

## 2021-02-14 NOTE — Progress Notes (Signed)
VASCULAR LAB    Left lower extremity venous duplex has been performed.  See CV proc for preliminary results.  Called report to Dr. Jacklynn Barnacle, [facility], RVT 02/14/2021, 12:22 PM

## 2021-04-05 ENCOUNTER — Ambulatory Visit
Admission: RE | Admit: 2021-04-05 | Discharge: 2021-04-05 | Disposition: A | Discharge status: Home / Self Care | Payer: Medicare Other | Source: Ambulatory Visit | Attending: Family Medicine | Admitting: Family Medicine

## 2021-04-05 ENCOUNTER — Other Ambulatory Visit: Payer: Self-pay

## 2021-04-05 DIAGNOSIS — Z1231 Encounter for screening mammogram for malignant neoplasm of breast: Secondary | ICD-10-CM

## 2021-12-22 ENCOUNTER — Emergency Department (HOSPITAL_COMMUNITY): Payer: Medicare Other

## 2021-12-22 ENCOUNTER — Encounter (HOSPITAL_COMMUNITY): Payer: Self-pay | Admitting: Emergency Medicine

## 2021-12-22 ENCOUNTER — Other Ambulatory Visit: Payer: Self-pay

## 2021-12-22 ENCOUNTER — Inpatient Hospital Stay (HOSPITAL_COMMUNITY)
Admission: EM | Admit: 2021-12-22 | Discharge: 2021-12-24 | DRG: 177 | Disposition: A | Discharge status: Home / Self Care | Payer: Medicare Other | Attending: Internal Medicine | Admitting: Internal Medicine

## 2021-12-22 DIAGNOSIS — R0902 Hypoxemia: Secondary | ICD-10-CM

## 2021-12-22 DIAGNOSIS — Z9981 Dependence on supplemental oxygen: Secondary | ICD-10-CM | POA: Diagnosis not present

## 2021-12-22 DIAGNOSIS — F419 Anxiety disorder, unspecified: Secondary | ICD-10-CM | POA: Diagnosis present

## 2021-12-22 DIAGNOSIS — Z7982 Long term (current) use of aspirin: Secondary | ICD-10-CM

## 2021-12-22 DIAGNOSIS — R0602 Shortness of breath: Secondary | ICD-10-CM | POA: Diagnosis present

## 2021-12-22 DIAGNOSIS — Z96653 Presence of artificial knee joint, bilateral: Secondary | ICD-10-CM | POA: Diagnosis present

## 2021-12-22 DIAGNOSIS — M810 Age-related osteoporosis without current pathological fracture: Secondary | ICD-10-CM | POA: Diagnosis present

## 2021-12-22 DIAGNOSIS — J1282 Pneumonia due to coronavirus disease 2019: Secondary | ICD-10-CM | POA: Diagnosis present

## 2021-12-22 DIAGNOSIS — Z87891 Personal history of nicotine dependence: Secondary | ICD-10-CM | POA: Diagnosis not present

## 2021-12-22 DIAGNOSIS — Z803 Family history of malignant neoplasm of breast: Secondary | ICD-10-CM | POA: Diagnosis not present

## 2021-12-22 DIAGNOSIS — I10 Essential (primary) hypertension: Secondary | ICD-10-CM | POA: Diagnosis present

## 2021-12-22 DIAGNOSIS — J9601 Acute respiratory failure with hypoxia: Secondary | ICD-10-CM | POA: Diagnosis present

## 2021-12-22 DIAGNOSIS — Z7951 Long term (current) use of inhaled steroids: Secondary | ICD-10-CM

## 2021-12-22 DIAGNOSIS — K2100 Gastro-esophageal reflux disease with esophagitis, without bleeding: Secondary | ICD-10-CM | POA: Diagnosis present

## 2021-12-22 DIAGNOSIS — E785 Hyperlipidemia, unspecified: Secondary | ICD-10-CM | POA: Diagnosis present

## 2021-12-22 DIAGNOSIS — N289 Disorder of kidney and ureter, unspecified: Secondary | ICD-10-CM

## 2021-12-22 DIAGNOSIS — J441 Chronic obstructive pulmonary disease with (acute) exacerbation: Secondary | ICD-10-CM | POA: Diagnosis present

## 2021-12-22 DIAGNOSIS — J440 Chronic obstructive pulmonary disease with acute lower respiratory infection: Secondary | ICD-10-CM | POA: Diagnosis present

## 2021-12-22 DIAGNOSIS — F32A Depression, unspecified: Secondary | ICD-10-CM | POA: Diagnosis present

## 2021-12-22 DIAGNOSIS — J45901 Unspecified asthma with (acute) exacerbation: Secondary | ICD-10-CM | POA: Diagnosis present

## 2021-12-22 DIAGNOSIS — U071 COVID-19: Principal | ICD-10-CM

## 2021-12-22 DIAGNOSIS — J9600 Acute respiratory failure, unspecified whether with hypoxia or hypercapnia: Principal | ICD-10-CM

## 2021-12-22 DIAGNOSIS — Z79899 Other long term (current) drug therapy: Secondary | ICD-10-CM | POA: Diagnosis not present

## 2021-12-22 LAB — COMPREHENSIVE METABOLIC PANEL
ALT: 14 U/L (ref 0–44)
AST: 21 U/L (ref 15–41)
Albumin: 3.7 g/dL (ref 3.5–5.0)
Alkaline Phosphatase: 101 U/L (ref 38–126)
Anion gap: 10 (ref 5–15)
BUN: 19 mg/dL (ref 8–23)
CO2: 22 mmol/L (ref 22–32)
Calcium: 9.1 mg/dL (ref 8.9–10.3)
Chloride: 101 mmol/L (ref 98–111)
Creatinine, Ser: 1.29 mg/dL — ABNORMAL HIGH (ref 0.44–1.00)
GFR, Estimated: 41 mL/min — ABNORMAL LOW (ref >60)
Glucose, Bld: 119 mg/dL — ABNORMAL HIGH (ref 70–99)
Potassium: 3.7 mmol/L (ref 3.5–5.1)
Sodium: 133 mmol/L — ABNORMAL LOW (ref 135–145)
Total Bilirubin: 0.2 mg/dL — ABNORMAL LOW (ref 0.3–1.2)
Total Protein: 7.7 g/dL (ref 6.5–8.1)

## 2021-12-22 LAB — TRIGLYCERIDES: Triglycerides: 87 mg/dL (ref <150)

## 2021-12-22 LAB — CBC WITH DIFFERENTIAL/PLATELET
Abs Immature Granulocytes: 0.04 10*3/uL (ref 0.00–0.07)
Basophils Absolute: 0.1 10*3/uL (ref 0.0–0.1)
Basophils Relative: 1 %
Eosinophils Absolute: 0.1 10*3/uL (ref 0.0–0.5)
Eosinophils Relative: 1 %
HCT: 43.8 % (ref 36.0–46.0)
Hemoglobin: 14 g/dL (ref 12.0–15.0)
Immature Granulocytes: 0 %
Lymphocytes Relative: 10 %
Lymphs Abs: 1 10*3/uL (ref 0.7–4.0)
MCH: 29.9 pg (ref 26.0–34.0)
MCHC: 32 g/dL (ref 30.0–36.0)
MCV: 93.6 fL (ref 80.0–100.0)
Monocytes Absolute: 0.9 10*3/uL (ref 0.1–1.0)
Monocytes Relative: 9 %
Neutro Abs: 8.1 10*3/uL — ABNORMAL HIGH (ref 1.7–7.7)
Neutrophils Relative %: 79 %
Platelets: 229 10*3/uL (ref 150–400)
RBC: 4.68 MIL/uL (ref 3.87–5.11)
RDW: 13 % (ref 11.5–15.5)
WBC: 10.3 10*3/uL (ref 4.0–10.5)
nRBC: 0 % (ref 0.0–0.2)

## 2021-12-22 LAB — RESP PANEL BY RT-PCR (FLU A&B, COVID) ARPGX2
Influenza A by PCR: NEGATIVE
Influenza B by PCR: NEGATIVE
SARS Coronavirus 2 by RT PCR: POSITIVE — AB

## 2021-12-22 LAB — FERRITIN: Ferritin: 49 ng/mL (ref 11–307)

## 2021-12-22 LAB — TROPONIN I (HIGH SENSITIVITY): Troponin I (High Sensitivity): 4 ng/L (ref <18)

## 2021-12-22 LAB — PROCALCITONIN: Procalcitonin: <0.1 ng/mL

## 2021-12-22 LAB — LACTATE DEHYDROGENASE: LDH: 155 U/L (ref 98–192)

## 2021-12-22 LAB — D-DIMER, QUANTITATIVE: D-Dimer, Quant: 0.64 ug/mL-FEU — ABNORMAL HIGH (ref 0.00–0.50)

## 2021-12-22 LAB — FIBRINOGEN: Fibrinogen: 494 mg/dL — ABNORMAL HIGH (ref 210–475)

## 2021-12-22 LAB — LACTIC ACID, PLASMA: Lactic Acid, Venous: 1.4 mmol/L (ref 0.5–1.9)

## 2021-12-22 MED ORDER — PREDNISONE 20 MG PO TABS: 50.0000 mg | ORAL_TABLET | Freq: Every day | ORAL | Status: DC

## 2021-12-22 MED ORDER — METHYLPREDNISOLONE SODIUM SUCC 125 MG IJ SOLR
0.5000 mg/kg | Freq: Two times a day (BID) | INTRAMUSCULAR | Status: DC
  Administered 2021-12-23 – 2021-12-24 (×3): 40 mg via INTRAVENOUS
  Filled 2021-12-22 (×3): qty 2

## 2021-12-22 MED ORDER — FLUOXETINE HCL 20 MG PO CAPS
40.0000 mg | ORAL_CAPSULE | Freq: Every day | ORAL | Status: DC
  Administered 2021-12-23 – 2021-12-24 (×2): 40 mg via ORAL
  Filled 2021-12-22 (×2): qty 2

## 2021-12-22 MED ORDER — PREDNISONE 20 MG PO TABS
40.0000 mg | ORAL_TABLET | Freq: Once | ORAL | Status: AC
  Administered 2021-12-22: 40 mg via ORAL
  Filled 2021-12-22: qty 2

## 2021-12-22 MED ORDER — GUAIFENESIN-DM 100-10 MG/5ML PO SYRP: 10.0000 mL | ORAL_SOLUTION | ORAL | Status: DC | PRN

## 2021-12-22 MED ORDER — HYDROCOD POLI-CHLORPHE POLI ER 10-8 MG/5ML PO SUER: 5.0000 mL | Freq: Two times a day (BID) | ORAL | Status: DC | PRN

## 2021-12-22 MED ORDER — NIRMATRELVIR/RITONAVIR (PAXLOVID)TABLET: 3.0000 | ORAL_TABLET | Freq: Two times a day (BID) | ORAL | Status: DC

## 2021-12-22 MED ORDER — ONDANSETRON HCL 4 MG/2ML IJ SOLN: 4.0000 mg | Freq: Four times a day (QID) | INTRAMUSCULAR | Status: DC | PRN

## 2021-12-22 MED ORDER — DILTIAZEM HCL ER COATED BEADS 120 MG PO CP24
120.0000 mg | ORAL_CAPSULE | Freq: Every day | ORAL | Status: DC
  Administered 2021-12-23 – 2021-12-24 (×2): 120 mg via ORAL
  Filled 2021-12-22 (×2): qty 1

## 2021-12-22 MED ORDER — MOMETASONE FURO-FORMOTEROL FUM 200-5 MCG/ACT IN AERO
2.0000 | INHALATION_SPRAY | Freq: Two times a day (BID) | RESPIRATORY_TRACT | Status: DC
  Administered 2021-12-23 – 2021-12-24 (×3): 2 via RESPIRATORY_TRACT
  Filled 2021-12-22: qty 8.8

## 2021-12-22 MED ORDER — ASPIRIN 325 MG PO TABS
325.0000 mg | ORAL_TABLET | Freq: Every day | ORAL | Status: DC
  Administered 2021-12-23 – 2021-12-24 (×2): 325 mg via ORAL
  Filled 2021-12-22 (×2): qty 1

## 2021-12-22 MED ORDER — ACETAMINOPHEN 325 MG PO TABS
650.0000 mg | ORAL_TABLET | Freq: Four times a day (QID) | ORAL | Status: DC | PRN
  Administered 2021-12-23 – 2021-12-24 (×2): 650 mg via ORAL
  Filled 2021-12-22 (×2): qty 2

## 2021-12-22 MED ORDER — PANTOPRAZOLE SODIUM 40 MG PO TBEC
40.0000 mg | DELAYED_RELEASE_TABLET | Freq: Every day | ORAL | Status: DC
  Administered 2021-12-23 – 2021-12-24 (×2): 40 mg via ORAL
  Filled 2021-12-22 (×2): qty 1

## 2021-12-22 MED ORDER — HYDROCHLOROTHIAZIDE 25 MG PO TABS
25.0000 mg | ORAL_TABLET | Freq: Every day | ORAL | Status: DC
  Administered 2021-12-23: 25 mg via ORAL
  Filled 2021-12-22: qty 1

## 2021-12-22 MED ORDER — ZINC SULFATE 220 (50 ZN) MG PO CAPS
220.0000 mg | ORAL_CAPSULE | Freq: Every day | ORAL | Status: DC
  Administered 2021-12-23 – 2021-12-24 (×2): 220 mg via ORAL
  Filled 2021-12-22 (×2): qty 1

## 2021-12-22 MED ORDER — ASCORBIC ACID 500 MG PO TABS
500.0000 mg | ORAL_TABLET | Freq: Every day | ORAL | Status: DC
  Administered 2021-12-23 – 2021-12-24 (×2): 500 mg via ORAL
  Filled 2021-12-22 (×2): qty 1

## 2021-12-22 MED ORDER — POLYETHYLENE GLYCOL 3350 17 G PO PACK: 17.0000 g | PACK | Freq: Every day | ORAL | Status: DC | PRN

## 2021-12-22 MED ORDER — ACETAMINOPHEN 325 MG PO TABS
650.0000 mg | ORAL_TABLET | Freq: Once | ORAL | Status: AC
  Administered 2021-12-22: 650 mg via ORAL
  Filled 2021-12-22: qty 2

## 2021-12-22 MED ORDER — LOSARTAN POTASSIUM 50 MG PO TABS: 100.0000 mg | ORAL_TABLET | Freq: Every day | ORAL | Status: DC

## 2021-12-22 MED ORDER — DEXAMETHASONE SODIUM PHOSPHATE 10 MG/ML IJ SOLN
10.0000 mg | Freq: Once | INTRAMUSCULAR | Status: AC
  Administered 2021-12-22: 10 mg via INTRAVENOUS
  Filled 2021-12-22: qty 1

## 2021-12-22 MED ORDER — OXYCODONE HCL 5 MG PO TABS: 5.0000 mg | ORAL_TABLET | ORAL | Status: DC | PRN

## 2021-12-22 MED ORDER — NIRMATRELVIR/RITONAVIR (PAXLOVID) TABLET (RENAL DOSING)
2.0000 | ORAL_TABLET | Freq: Two times a day (BID) | ORAL | Status: DC
  Administered 2021-12-23 – 2021-12-24 (×4): 2 via ORAL
  Filled 2021-12-22: qty 20

## 2021-12-22 MED ORDER — ONDANSETRON HCL 4 MG PO TABS: 4.0000 mg | ORAL_TABLET | Freq: Four times a day (QID) | ORAL | Status: DC | PRN

## 2021-12-22 MED ORDER — HEPARIN SODIUM (PORCINE) 5000 UNIT/ML IJ SOLN
5000.0000 [IU] | Freq: Three times a day (TID) | INTRAMUSCULAR | Status: DC
  Administered 2021-12-22 – 2021-12-24 (×6): 5000 [IU] via SUBCUTANEOUS
  Filled 2021-12-22 (×6): qty 1

## 2021-12-22 MED ORDER — ALBUTEROL SULFATE HFA 108 (90 BASE) MCG/ACT IN AERS: 2.0000 | INHALATION_SPRAY | RESPIRATORY_TRACT | Status: DC | PRN

## 2021-12-22 MED ORDER — ALBUTEROL SULFATE HFA 108 (90 BASE) MCG/ACT IN AERS
2.0000 | INHALATION_SPRAY | Freq: Once | RESPIRATORY_TRACT | Status: AC
  Administered 2021-12-22: 2 via RESPIRATORY_TRACT
  Filled 2021-12-22: qty 6.7

## 2021-12-22 NOTE — ED Provider Notes (Signed)
?[facility] EMERGENCY DEPARTMENT ?Provider Note ? ? ?CSN: [id number] ?Arrival date & time: 12/22/21  1919 ? ?  ? ?History ? ?Chief Complaint  ?Patient presents with  ? Shortness of Breath  ?  COVID+ 2 hours ago   ? ? ?Deanna Acosta is a 86 y.o. female. ? ? ?Shortness of Breath ? ? 27 y /o female - with COPD - presenting with coughing for a couple of days and fever with headache that came on last night - has had no vomiting or diarrhea but has had some aches, and headache and cough today - feeling more SOB - she was supposed to somebody that had a respiratory illness or going away on Thursday.  The patient states she is up-to-date on her vaccinations.  She took a home positive test for COVID just prior to arrival.   ? ?Home Medications ?Prior to Admission medications   ?Medication Sig Start Date End Date Taking? Authorizing Provider  ?albuterol (PROVENTIL HFA;VENTOLIN HFA) 108 (90 Base) MCG/ACT inhaler Inhale 2 puffs into the lungs every 6 (six) hours as needed for wheezing or shortness of breath.     [provider]  ?aspirin 325 MG tablet Take 325 mg by mouth daily.     [provider]  ?cholecalciferol (VITAMIN D) 25 MCG (1000 UT) tablet Take 1,000 Units by mouth daily.     [provider]  ?diltiazem (CARDIZEM CD) 120 MG 24 hr capsule Take 120 mg by mouth daily.  08/01/14   [provider]  ?FLUoxetine (PROZAC) 40 MG capsule Take 40 mg by mouth daily before breakfast.     [provider]  ?fluticasone (FLONASE) 50 MCG/ACT nasal spray Place 1 spray into both nostrils 2 (two) times daily as needed for allergies.  06/13/14   [provider]  ?Fluticasone-Salmeterol (ADVAIR) 250-50 MCG/DOSE AEPB Inhale 1 puff into the lungs daily as needed (wheezing or shortness of breath).     [provider]  ?hydrochlorothiazide (HYDRODIURIL) 25 MG tablet Take 25 mg by mouth daily. 06/15/17   [provider]  ?losartan (COZAAR) 100 MG tablet Take 100 mg by  mouth daily.    [provider]  ?magic mouthwash w/lidocaine SOLN Take 10 mLs by mouth 3 (three) times daily as needed for mouth pain. Swish and spit do not swallow 09/08/19   Gerhard Munch, MD  ?mometasone-formoterol [facility]) 200-5 MCG/ACT AERO Inhale 2 puffs into the lungs 2 (two) times daily. 12/02/18   Donalee Citrin, MD  ?nystatin (MYCOSTATIN) 100000 UNIT/ML suspension Take 5 mLs (500,000 Units total) by mouth 4 (four) times daily. Swish and swallow x 7-14 days. Retain in mouth as long as possible 08/05/19   Domenick Gong, MD  ?omeprazole (PRILOSEC) 40 MG capsule Take 40 mg by mouth daily.     [provider]  ?OXYGEN Inhale 2 L into the lungs as needed (for shortness of breath with exertion).    [provider]  ?polyethylene glycol powder (GLYCOLAX/MIRALAX) powder Take 17 g by mouth daily. 06/21/17   Gwyneth Sprout, MD  ?senna-docusate (SENOKOT-S) 8.6-50 MG tablet Take 1 tablet by mouth 2 (two) times daily. 06/21/17   Gwyneth Sprout, MD  ?traMADol (ULTRAM) 50 MG tablet Take 50 mg by mouth daily as needed (when bowling).     [provider]  ?   ? ?Allergies    ?Cortisone, Penicillins, Losartan, Other, Tape, and Levofloxacin   ? ?Review of Systems   ?Review of Systems  ?Respiratory:  Positive for  shortness of breath.   ?All other systems reviewed and are negative. ? ?Physical Exam ?Updated Vital Signs ?BP 118/77   Pulse 79   Temp 98.5 ?F (36.9 ?C) (Oral)   Resp (!) 24   Ht 1.651 m (5\' 5" )   Wt 80.3 kg   SpO2 96%   BMI 29.45 kg/m?  ?Physical Exam ?Vitals and nursing note reviewed.  ?Constitutional:   ?   General: She is not in acute distress. ?   Appearance: She is well-developed.  ?HENT:  ?   Head: Normocephalic and atraumatic.  ?   Nose: No congestion or rhinorrhea.  ?   Mouth/Throat:  ?   Mouth: Mucous membranes are moist.  ?   Pharynx: No oropharyngeal exudate.  ?Eyes:  ?   General: No scleral icterus.    ?   Right eye: No discharge.     ?   Left eye: No  discharge.  ?   Conjunctiva/sclera: Conjunctivae normal.  ?   Pupils: Pupils are equal, round, and reactive to light.  ?Neck:  ?   Thyroid: No thyromegaly.  ?   Vascular: No JVD.  ?Cardiovascular:  ?   Rate and Rhythm: Normal rate and regular rhythm.  ?   Heart sounds: Normal heart sounds. No murmur heard. ?  No friction rub. No gallop.  ?Pulmonary:  ?   Effort: Pulmonary effort is normal. No respiratory distress.  ?   Breath sounds: Rales (Subtle rales at the bases that seem to clear with deep breathing and coughing) present. No wheezing.  ?   Comments: Speaks in full sentences, mild tachypnea at 22 breaths/min ?Abdominal:  ?   General: Bowel sounds are normal. There is no distension.  ?   Palpations: Abdomen is soft. There is no mass.  ?   Tenderness: There is no abdominal tenderness.  ?Musculoskeletal:     ?   General: No tenderness. Normal range of motion.  ?   Cervical back: Normal range of motion and neck supple.  ?   Right lower leg: No edema.  ?   Left lower leg: No edema.  ?Lymphadenopathy:  ?   Cervical: No cervical adenopathy.  ?Skin: ?   General: Skin is warm and dry.  ?   Findings: No erythema or rash.  ?Neurological:  ?   Mental Status: She is alert.  ?   Coordination: Coordination normal.  ?Psychiatric:     ?   Behavior: Behavior normal.  ? ? ?ED Results / Procedures / Treatments   ?Labs ?(all labs ordered are listed, but only abnormal results are displayed) ?Labs Reviewed  ?RESP PANEL BY RT-PCR (FLU A&B, COVID) ARPGX2 - Abnormal; Notable for the following components:  ?    Result Value  ? SARS Coronavirus 2 by RT PCR POSITIVE (*)   ? All other components within normal limits  ?CBC WITH DIFFERENTIAL/PLATELET - Abnormal; Notable for the following components:  ? Neutro Abs 8.1 (*)   ? All other components within normal limits  ?COMPREHENSIVE METABOLIC PANEL - Abnormal; Notable for the following components:  ? Sodium 133 (*)   ? Glucose, Bld 119 (*)   ? Creatinine, Ser 1.29 (*)   ? Total Bilirubin 0.2  (*)   ? GFR, Estimated 41 (*)   ? All other components within normal limits  ?CULTURE, BLOOD (ROUTINE X 2)  ?CULTURE, BLOOD (ROUTINE X 2)  ?LACTIC ACID, PLASMA  ?LACTIC ACID, PLASMA  ?D-DIMER, QUANTITATIVE  ?PROCALCITONIN  ?LACTATE DEHYDROGENASE  ?  FERRITIN  ?TRIGLYCERIDES  ?FIBRINOGEN  ?C-REACTIVE PROTEIN  ?TROPONIN I (HIGH SENSITIVITY)  ? ? ?EKG ?None ? ?Radiology ?DG Chest Port 1 View ? ?Result Date: 12/22/2021 ?CLINICAL DATA:  covid pneumonia EXAM: PORTABLE CHEST 1 VIEW.  Patient is rotated. COMPARISON:  Chest x-ray 09/10/2021, CT angiography chest 12/03/2018 FINDINGS: The heart and mediastinal contours are unchanged. Aortic calcification. Redemonstration of a slightly more conspicuous 5mm left apical calcified nodular-like density. Query vague right lower lobe airspace opacity. Chronic coarsened interstitial markings with no overt pulmonary edema. No pleural effusion. No pneumothorax. No acute osseous abnormality. IMPRESSION: 1. Query vague right lower lobe airspace opacity. Limited evaluation due to patient rotation. Followup PA and lateral chest X-ray is recommended in 3-4 weeks following therapy to ensure resolution and exclude underlying malignancy. 2. Slightly more conspicuous left apical calcified nodular-like density. Electronically Signed   By: Tish FredericksonMorgane  Naveau M.D.   On: 12/22/2021 20:15   ? ?Procedures ?Marland Kitchen.Critical Care ?Performed by: Eber HongMiller, Goldie Dimmer, MD ?Authorized by: Eber HongMiller, Shadiamond Koska, MD  ? ?Critical care provider statement:  ?  Critical care time (minutes):  45 ?  Critical care time was exclusive of:  Separately billable procedures and treating other patients and teaching time ?  Critical care was necessary to treat or prevent imminent or life-threatening deterioration of the following conditions:  Respiratory failure ?  Critical care was time spent personally by me on the following activities:  Development of treatment plan with patient or surrogate, discussions with consultants, evaluation of patient's  response to treatment, examination of patient, ordering and review of laboratory studies, ordering and review of radiographic studies, ordering and performing treatments and interventions, pulse oximetry, re-evaluatio

## 2021-12-22 NOTE — ED Notes (Signed)
Pt O2 at 86% getting out of bed and standing up on room air. Dropped to 83% with one step back in bed on 2 lpm via [place] at present ? ?

## 2021-12-22 NOTE — ED Triage Notes (Signed)
Pt reports she had a positive home COVID test x 2 hours ago, reports feeling short of breath, headache, nasal drainage and fatigue; RA O2 sat 92-94% ?

## 2021-12-23 DIAGNOSIS — U071 COVID-19: Secondary | ICD-10-CM | POA: Diagnosis not present

## 2021-12-23 DIAGNOSIS — K2100 Gastro-esophageal reflux disease with esophagitis, without bleeding: Secondary | ICD-10-CM | POA: Diagnosis not present

## 2021-12-23 DIAGNOSIS — J441 Chronic obstructive pulmonary disease with (acute) exacerbation: Secondary | ICD-10-CM

## 2021-12-23 DIAGNOSIS — J9601 Acute respiratory failure with hypoxia: Secondary | ICD-10-CM

## 2021-12-23 DIAGNOSIS — I10 Essential (primary) hypertension: Secondary | ICD-10-CM

## 2021-12-23 LAB — CBC WITH DIFFERENTIAL/PLATELET
Abs Immature Granulocytes: 0.04 10*3/uL (ref 0.00–0.07)
Basophils Absolute: 0 10*3/uL (ref 0.0–0.1)
Basophils Relative: 0 %
Eosinophils Absolute: 0 10*3/uL (ref 0.0–0.5)
Eosinophils Relative: 0 %
HCT: 40.4 % (ref 36.0–46.0)
Hemoglobin: 12.9 g/dL (ref 12.0–15.0)
Immature Granulocytes: 1 %
Lymphocytes Relative: 8 %
Lymphs Abs: 0.6 10*3/uL — ABNORMAL LOW (ref 0.7–4.0)
MCH: 29.4 pg (ref 26.0–34.0)
MCHC: 31.9 g/dL (ref 30.0–36.0)
MCV: 92 fL (ref 80.0–100.0)
Monocytes Absolute: 0.1 10*3/uL (ref 0.1–1.0)
Monocytes Relative: 1 %
Neutro Abs: 6.5 10*3/uL (ref 1.7–7.7)
Neutrophils Relative %: 90 %
Platelets: 212 10*3/uL (ref 150–400)
RBC: 4.39 MIL/uL (ref 3.87–5.11)
RDW: 12.6 % (ref 11.5–15.5)
WBC: 7.3 10*3/uL (ref 4.0–10.5)
nRBC: 0 % (ref 0.0–0.2)

## 2021-12-23 LAB — COMPREHENSIVE METABOLIC PANEL
ALT: 13 U/L (ref 0–44)
AST: 18 U/L (ref 15–41)
Albumin: 3.4 g/dL — ABNORMAL LOW (ref 3.5–5.0)
Alkaline Phosphatase: 93 U/L (ref 38–126)
Anion gap: 12 (ref 5–15)
BUN: 21 mg/dL (ref 8–23)
CO2: 20 mmol/L — ABNORMAL LOW (ref 22–32)
Calcium: 9 mg/dL (ref 8.9–10.3)
Chloride: 103 mmol/L (ref 98–111)
Creatinine, Ser: 1.26 mg/dL — ABNORMAL HIGH (ref 0.44–1.00)
GFR, Estimated: 42 mL/min — ABNORMAL LOW (ref >60)
Glucose, Bld: 187 mg/dL — ABNORMAL HIGH (ref 70–99)
Potassium: 4.1 mmol/L (ref 3.5–5.1)
Sodium: 135 mmol/L (ref 135–145)
Total Bilirubin: 0.4 mg/dL (ref 0.3–1.2)
Total Protein: 7.2 g/dL (ref 6.5–8.1)

## 2021-12-23 LAB — C-REACTIVE PROTEIN: CRP: 2 mg/dL — ABNORMAL HIGH (ref <1.0)

## 2021-12-23 LAB — LACTIC ACID, PLASMA: Lactic Acid, Venous: 1.3 mmol/L (ref 0.5–1.9)

## 2021-12-23 MED ORDER — HYDRALAZINE HCL 20 MG/ML IJ SOLN: 10.0000 mg | Freq: Four times a day (QID) | INTRAMUSCULAR | Status: DC | PRN

## 2021-12-23 NOTE — Assessment & Plan Note (Addendum)
3 days of symptoms ?COVID-positive ?Requiring 2 L nasal cannula on admission ?Treated with Paxlovid and steroids ?Inflammatory markers minimally elevated, I have since trended down ?She was able to wean off oxygen and is clinically improved ?She will be continued on a course of Paxlovid after discharge and will be placed on a prednisone taper ?
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
## 2021-12-23 NOTE — Assessment & Plan Note (Addendum)
Diltiazem, hydrochlorothiazide, metoprolol  ?Continue to monitor ?

## 2021-12-23 NOTE — Assessment & Plan Note (Signed)
-   Continue PPI ?-Continue to monitor ?

## 2021-12-23 NOTE — Assessment & Plan Note (Addendum)
Secondary to COVID-19 pneumonia and COPD ?Requiring 2 L of oxygen ?She has not been weaned down to room air and is breathing comfortably on ambulation ? ?

## 2021-12-23 NOTE — TOC Progression Note (Signed)
?  Transition of Care (TOC) Screening Note ? ? ?Patient Details  ?Name: Deanna Acosta ?Date of Birth: [birth year] ? ? ?Transition of Care (TOC) CM/SW Contact:    ?Elliot Gault, LCSW ?Phone Number: ?12/23/2021, 9:03 AM ? ? ? ?Transition of Care Department [facility]) has reviewed patient and no TOC needs have been identified at this time. We will continue to monitor patient advancement through interdisciplinary progression rounds. If new patient transition needs arise, please place a TOC consult. ? ? ?

## 2021-12-23 NOTE — Assessment & Plan Note (Addendum)
She was treated with intravenous steroids, bronchodilators and pulmonary hygiene ?Overall wheezing has improved ?She has been continued on prednisone taper and bronchodilators ?

## 2021-12-23 NOTE — Progress Notes (Signed)
Patient admitted to the hospital earlier this morning by Dr. Carren Rang ? ?Patient seen and examined.  She continues to have evidence of wheezing.  Still seems short of breath in conversation.  She has productive cough. ? ? ?She is been admitted to the hospital with acute respiratory failure with hypoxia secondary to COVID-19 pneumonia.  She is currently on Paxlovid, intravenous steroids, bronchodilators.  We will try and wean off oxygen as tolerated.  Continue pulmonary hygiene. ? ?Erick Blinks ? ?

## 2021-12-24 DIAGNOSIS — U071 COVID-19: Secondary | ICD-10-CM | POA: Diagnosis not present

## 2021-12-24 DIAGNOSIS — J441 Chronic obstructive pulmonary disease with (acute) exacerbation: Secondary | ICD-10-CM | POA: Diagnosis not present

## 2021-12-24 DIAGNOSIS — K2100 Gastro-esophageal reflux disease with esophagitis, without bleeding: Secondary | ICD-10-CM | POA: Diagnosis not present

## 2021-12-24 DIAGNOSIS — J9601 Acute respiratory failure with hypoxia: Secondary | ICD-10-CM | POA: Diagnosis not present

## 2021-12-24 LAB — COMPREHENSIVE METABOLIC PANEL
ALT: 12 U/L (ref 0–44)
AST: 17 U/L (ref 15–41)
Albumin: 3.6 g/dL (ref 3.5–5.0)
Alkaline Phosphatase: 96 U/L (ref 38–126)
Anion gap: 13 (ref 5–15)
BUN: 28 mg/dL — ABNORMAL HIGH (ref 8–23)
CO2: 22 mmol/L (ref 22–32)
Calcium: 9.4 mg/dL (ref 8.9–10.3)
Chloride: 102 mmol/L (ref 98–111)
Creatinine, Ser: 1.37 mg/dL — ABNORMAL HIGH (ref 0.44–1.00)
GFR, Estimated: 38 mL/min — ABNORMAL LOW (ref >60)
Glucose, Bld: 155 mg/dL — ABNORMAL HIGH (ref 70–99)
Potassium: 3.6 mmol/L (ref 3.5–5.1)
Sodium: 137 mmol/L (ref 135–145)
Total Bilirubin: 0.4 mg/dL (ref 0.3–1.2)
Total Protein: 7.5 g/dL (ref 6.5–8.1)

## 2021-12-24 LAB — CBC WITH DIFFERENTIAL/PLATELET
Abs Immature Granulocytes: 0.17 10*3/uL — ABNORMAL HIGH (ref 0.00–0.07)
Basophils Absolute: 0 10*3/uL (ref 0.0–0.1)
Basophils Relative: 0 %
Eosinophils Absolute: 0 10*3/uL (ref 0.0–0.5)
Eosinophils Relative: 0 %
HCT: 43.8 % (ref 36.0–46.0)
Hemoglobin: 13.8 g/dL (ref 12.0–15.0)
Immature Granulocytes: 1 %
Lymphocytes Relative: 5 %
Lymphs Abs: 0.9 10*3/uL (ref 0.7–4.0)
MCH: 29.2 pg (ref 26.0–34.0)
MCHC: 31.5 g/dL (ref 30.0–36.0)
MCV: 92.6 fL (ref 80.0–100.0)
Monocytes Absolute: 0.6 10*3/uL (ref 0.1–1.0)
Monocytes Relative: 3 %
Neutro Abs: 15.9 10*3/uL — ABNORMAL HIGH (ref 1.7–7.7)
Neutrophils Relative %: 91 %
Platelets: 270 10*3/uL (ref 150–400)
RBC: 4.73 MIL/uL (ref 3.87–5.11)
RDW: 12.4 % (ref 11.5–15.5)
WBC: 17.6 10*3/uL — ABNORMAL HIGH (ref 4.0–10.5)
nRBC: 0 % (ref 0.0–0.2)

## 2021-12-24 LAB — C-REACTIVE PROTEIN: CRP: 1.6 mg/dL — ABNORMAL HIGH (ref <1.0)

## 2021-12-24 LAB — D-DIMER, QUANTITATIVE: D-Dimer, Quant: 0.51 ug/mL-FEU — ABNORMAL HIGH (ref 0.00–0.50)

## 2021-12-24 MED ORDER — ALBUTEROL SULFATE (2.5 MG/3ML) 0.083% IN NEBU: 2.5000 mg | INHALATION_SOLUTION | RESPIRATORY_TRACT | 2 refills | Status: AC | PRN

## 2021-12-24 MED ORDER — ALUM & MAG HYDROXIDE-SIMETH 200-200-20 MG/5ML PO SUSP
30.0000 mL | ORAL | Status: DC | PRN
Start: 2021-12-24 — End: 2021-12-24
  Administered 2021-12-24: 30 mL via ORAL
  Filled 2021-12-24: qty 30

## 2021-12-24 MED ORDER — NIRMATRELVIR/RITONAVIR (PAXLOVID) TABLET (RENAL DOSING)
2.0000 | ORAL_TABLET | Freq: Two times a day (BID) | ORAL | 0 refills | Status: AC
Start: 2021-12-24 — End: 2021-12-29

## 2021-12-24 MED ORDER — PREDNISONE 10 MG PO TABS: ORAL_TABLET | ORAL | 0 refills | Status: DC

## 2021-12-24 NOTE — Progress Notes (Signed)
Ng Discharge Note ? ?Admit Date:  12/22/2021 ?Discharge date: 12/24/2021 ?  ?Deanna Acosta to be D/C'd Home per MD order.  AVS completed. ?Patient/caregiver able to verbalize understanding. ? ?Discharge Medication: ?Allergies as of 12/24/2021   ? ?   Reactions  ? Cortisone Other (See Comments)  ? Headaches  ? Penicillins Hives, Other (See Comments)  ? Has patient had a PCN reaction causing immediate rash, facial/tongue/throat swelling, SOB or lightheadedness with hypotension: Yes ?Has patient had a PCN reaction causing severe rash involving mucus membranes or skin necrosis: No ?Has patient had a PCN reaction that required hospitalization No ?Has patient had a PCN reaction occurring within the last 10 years: No ?If all of the above answers are "NO", then may proceed with Cephalosporin use.  ? Losartan Diarrhea, Nausea And Vomiting  ? Other Nausea And Vomiting, Other (See Comments)  ? "Fluid pills" cause cramps  ? Tape Other (See Comments)  ? SKIN IS VERY THIN AND BRUISES AND TEARS VERY EASILY!!!!!  ? Levofloxacin Rash  ? ?  ? ?  ?Medication List  ?  ? ?STOP taking these medications   ? ?losartan 100 MG tablet ?Commonly known as: COZAAR ?  ?magic mouthwash w/lidocaine Soln ?  ?mometasone-formoterol 200-5 MCG/ACT Aero ?Commonly known as: DULERA ?  ?OXYGEN ?  ?polyethylene glycol powder 17 GM/SCOOP powder ?Commonly known as: GLYCOLAX/MIRALAX ?  ? ?  ? ?TAKE these medications   ? ?Advair HFA 115-21 MCG/ACT inhaler ?Generic drug: fluticasone-salmeterol ?Inhale 2 puffs into the lungs 2 (two) times daily. ?  ?albuterol 108 (90 Base) MCG/ACT inhaler ?Commonly known as: VENTOLIN HFA ?Inhale 2 puffs into the lungs every 6 (six) hours as needed for wheezing or shortness of breath. ?What changed: Another medication with the same name was added. Make sure you understand how and when to take each. ?  ?albuterol (2.5 MG/3ML) 0.083% nebulizer solution ?Commonly known as: PROVENTIL ?Take 3 mLs (2.5 mg total) by nebulization every 4  (four) hours as needed for wheezing or shortness of breath. ?What changed: You were already taking a medication with the same name, and this prescription was added. Make sure you understand how and when to take each. ?  ?aspirin 325 MG tablet ?Take 325 mg by mouth daily. ?  ?diltiazem 120 MG 24 hr capsule ?Commonly known as: CARDIZEM CD ?Take 120 mg by mouth daily. ?  ?FLUoxetine 40 MG capsule ?Commonly known as: PROZAC ?Take 40 mg by mouth daily before breakfast. ?  ?hydrochlorothiazide 25 MG tablet ?Commonly known as: HYDRODIURIL ?Take 25 mg by mouth daily. ?  ?metoprolol succinate 100 MG 24 hr tablet ?Commonly known as: TOPROL-XL ?Take 100 mg by mouth daily. ?  ?nirmatrelvir/ritonavir EUA (renal dosing) 10 x 150 MG & 10 x 100MG  Tabs ?Commonly known as: PAXLOVID ?Take 2 tablets by mouth 2 (two) times daily for 5 days. Patient GFR is 38. Take nirmatrelvir (150 mg) one tablet twice daily for 5 days and ritonavir (100 mg) one tablet twice daily for 5 days. ?  ?nystatin 100000 UNIT/ML suspension ?Commonly known as: MYCOSTATIN ?Take 5 mLs (500,000 Units total) by mouth 4 (four) times daily. Swish and swallow x 7-14 days. Retain in mouth as long as possible ?  ?omeprazole 40 MG capsule ?Commonly known as: PRILOSEC ?Take 40 mg by mouth daily. ?  ?predniSONE 10 MG tablet ?Commonly known as: DELTASONE ?Take 40mg  po daily for 2 days then 30mg  daily for 2 days then 20mg  daily for 2 days then 10mg   daily for 2 days then stop ?  ?rosuvastatin 5 MG tablet ?Commonly known as: CRESTOR ?Take 5 mg by mouth daily. ?  ? ?  ? ? ?Discharge Assessment: ?Vitals:  ? 12/24/21 0848 12/24/21 1319  ?BP:  (!) 160/62  ?Pulse:  62  ?Resp:  20  ?Temp:  97.7 ?F (36.5 ?C)  ?SpO2: 96% 96%  ? Skin clean, dry and intact without evidence of skin break down, no evidence of skin tears noted. ?IV catheter discontinued intact. Site without signs and symptoms of complications - no redness or edema noted at insertion site, patient denies c/o pain - only  slight tenderness at site.  Dressing with slight pressure applied. ? ?D/c Instructions-Education: ?Discharge instructions given to patient/family with verbalized understanding. ?D/c education completed with patient/family including follow up instructions, medication list, d/c activities limitations if indicated, with other d/c instructions as indicated by MD - patient able to verbalize understanding, all questions fully answered. ?Patient instructed to return to ED, call 911, or call MD for any changes in condition.  ?Patient escorted via WC, and D/C home via private auto. ? ?Cristal Ford, RN ?12/24/2021 3:46 PM   ?

## 2021-12-24 NOTE — Hospital Course (Signed)
86 year old female with a history of COPD, admitted to the hospital with COVID-19 pneumonia and respiratory failure.  She was treated with Paxlovid, bronchodilators and steroids.  She was requiring 2 L of oxygen, but was able to wean off to room air.  She is now ambulating comfortably on room air.  Inflammatory markers minimally elevated and they have since trended down.  She has been transitioned to prednisone taper.  She will be continued on Paxlovid after discharge. ?

## 2021-12-24 NOTE — Progress Notes (Signed)
SATURATION QUALIFICATIONS: (This note is used to comply with regulatory documentation for home oxygen)  Patient Saturations on Room Air at Rest = 95%  Patient Saturations on Room Air while Ambulating = 92%  Patient Saturations on 0 Liters of oxygen while Ambulating = %  Please briefly explain why patient needs home oxygen: 

## 2021-12-24 NOTE — Discharge Summary (Signed)
?Physician Discharge Summary ?  ?Patient: Deanna Acosta MRN: [id number] DOB: Apr 30, 1936  ?Admit date:     12/22/2021  ?Discharge date: 12/24/21  ?Discharge Physician: Deanna Acosta  ? ?PCP: Sigmund HazelMiller, Lisa, MD  ? ?Recommendations at discharge:  ? ?Follow-up with primary care physician in 1 to 2 weeks ? ?Discharge Diagnoses: ?Principal Problem: ?  Acute respiratory failure with hypoxia (HCC) ?Active Problems: ?  COPD exacerbation (HCC) ?  HTN (hypertension) ?  Gastroesophageal reflux disease with esophagitis ?  COVID-19 virus infection ? ?Resolved Problems: ?  * No resolved hospital problems. * ? ?Hospital Course: ?86 year old female with a history of COPD, admitted to the hospital with COVID-19 pneumonia and respiratory failure.  She was treated with Paxlovid, bronchodilators and steroids.  She was requiring 2 L of oxygen, but was able to wean off to room air.  She is now ambulating comfortably on room air.  Inflammatory markers minimally elevated and they have since trended down.  She has been transitioned to prednisone taper.  She will be continued on Paxlovid after discharge. ? ?Assessment and Plan: ?* Acute respiratory failure with hypoxia (HCC) ?Secondary to COVID-19 pneumonia and COPD ?Requiring 2 L of oxygen ?She has not been weaned down to room air and is breathing comfortably on ambulation ? ? ?COVID-19 virus infection ?3 days of symptoms ?COVID-positive ?Requiring 2 L nasal cannula on admission ?Treated with Paxlovid and steroids ?Inflammatory markers minimally elevated, I have since trended down ?She was able to wean off oxygen and is clinically improved ?She will be continued on a course of Paxlovid after discharge and will be placed on a prednisone taper ? ?Gastroesophageal reflux disease with esophagitis ?Continue PPI ?Continue to monitor ? ?HTN (hypertension) ?Diltiazem, hydrochlorothiazide, metoprolol  ?Continue to monitor ? ?COPD exacerbation (HCC) ?She was treated with intravenous steroids,  bronchodilators and pulmonary hygiene ?Overall wheezing has improved ?She has been continued on prednisone taper and bronchodilators ? ? ? ? ?  ? ? ?Consultants:  ?Procedures performed:   ?Disposition: Home ?Diet recommendation:  ?Discharge Diet Orders (From admission, onward)  ? ?  Start     Ordered  ? 12/24/21 0000  Diet - low sodium heart healthy       ? 12/24/21 1535  ? ?  ?  ? ?  ? ?Cardiac diet ?DISCHARGE MEDICATION: ?Allergies as of 12/24/2021   ? ?   Reactions  ? Cortisone Other (See Comments)  ? Headaches  ? Penicillins Hives, Other (See Comments)  ? Has patient had a PCN reaction causing immediate rash, facial/tongue/throat swelling, SOB or lightheadedness with hypotension: Yes ?Has patient had a PCN reaction causing severe rash involving mucus membranes or skin necrosis: No ?Has patient had a PCN reaction that required hospitalization No ?Has patient had a PCN reaction occurring within the last 10 years: No ?If all of the above answers are "NO", then may proceed with Cephalosporin use.  ? Losartan Diarrhea, Nausea And Vomiting  ? Other Nausea And Vomiting, Other (See Comments)  ? "Fluid pills" cause cramps  ? Tape Other (See Comments)  ? SKIN IS VERY THIN AND BRUISES AND TEARS VERY EASILY!!!!!  ? Levofloxacin Rash  ? ?  ? ?  ?Medication List  ?  ? ?STOP taking these medications   ? ?losartan 100 MG tablet ?Commonly known as: COZAAR ?  ?magic mouthwash w/lidocaine Soln ?  ?mometasone-formoterol 200-5 MCG/ACT Aero ?Commonly known as: DULERA ?  ?OXYGEN ?  ?polyethylene glycol powder 17 GM/SCOOP powder ?Commonly known as:  GLYCOLAX/MIRALAX ?  ? ?  ? ?TAKE these medications   ? ?Advair HFA 115-21 MCG/ACT inhaler ?Generic drug: fluticasone-salmeterol ?Inhale 2 puffs into the lungs 2 (two) times daily. ?  ?albuterol 108 (90 Base) MCG/ACT inhaler ?Commonly known as: VENTOLIN HFA ?Inhale 2 puffs into the lungs every 6 (six) hours as needed for wheezing or shortness of breath. ?What changed: Another medication with  the same name was added. Make sure you understand how and when to take each. ?  ?albuterol (2.5 MG/3ML) 0.083% nebulizer solution ?Commonly known as: PROVENTIL ?Take 3 mLs (2.5 mg total) by nebulization every 4 (four) hours as needed for wheezing or shortness of breath. ?What changed: You were already taking a medication with the same name, and this prescription was added. Make sure you understand how and when to take each. ?  ?aspirin 325 MG tablet ?Take 325 mg by mouth daily. ?  ?diltiazem 120 MG 24 hr capsule ?Commonly known as: CARDIZEM CD ?Take 120 mg by mouth daily. ?  ?FLUoxetine 40 MG capsule ?Commonly known as: PROZAC ?Take 40 mg by mouth daily before breakfast. ?  ?hydrochlorothiazide 25 MG tablet ?Commonly known as: HYDRODIURIL ?Take 25 mg by mouth daily. ?  ?metoprolol succinate 100 MG 24 hr tablet ?Commonly known as: TOPROL-XL ?Take 100 mg by mouth daily. ?  ?nirmatrelvir/ritonavir EUA (renal dosing) 10 x 150 MG & 10 x 100MG  Tabs ?Commonly known as: PAXLOVID ?Take 2 tablets by mouth 2 (two) times daily for 5 days. Patient GFR is 38. Take nirmatrelvir (150 mg) one tablet twice daily for 5 days and ritonavir (100 mg) one tablet twice daily for 5 days. ?  ?nystatin 100000 UNIT/ML suspension ?Commonly known as: MYCOSTATIN ?Take 5 mLs (500,000 Units total) by mouth 4 (four) times daily. Swish and swallow x 7-14 days. Retain in mouth as long as possible ?  ?omeprazole 40 MG capsule ?Commonly known as: PRILOSEC ?Take 40 mg by mouth daily. ?  ?predniSONE 10 MG tablet ?Commonly known as: DELTASONE ?Take 40mg  po daily for 2 days then 30mg  daily for 2 days then 20mg  daily for 2 days then 10mg  daily for 2 days then stop ?  ?rosuvastatin 5 MG tablet ?Commonly known as: CRESTOR ?Take 5 mg by mouth daily. ?  ? ?  ? ? Follow-up Information   ? ? , MD Follow up.   ?Specialty: Family Medicine ?Why: you will need to be in isolation until 3/30 ?Contact information: ?[address] ?[place]  ?[phone] ? ? ?  ?  ? ?  ?  ? ?  ? ?Discharge Exam: ?4/30 Weights  ? 12/22/21 1929  ?Weight: 80.3 kg  ? ?General exam: Alert, awake, oriented x 3 ?Respiratory system: Minimal wheeze bilaterally. Respiratory effort normal. ?Cardiovascular system:RRR. No murmurs, rubs, gallops. ?Gastrointestinal system: Abdomen is nondistended, soft and nontender. No organomegaly or masses felt. Normal bowel sounds heard. ?Central nervous system: Alert and oriented. No focal neurological deficits. ?Extremities: No C/C/E, +pedal pulses ?Skin: No rashes, lesions or ulcers ?Psychiatry: Judgement and insight appear normal. Mood & affect appropriate.  ? ? ?Condition at discharge: good ? ?The results of significant diagnostics from this hospitalization (including imaging, microbiology, ancillary and laboratory) are listed below for reference.  ? ?Imaging Studies: ?DG Chest Port 1 View ? ?Result Date: 12/22/2021 ?CLINICAL DATA:  covid pneumonia EXAM: PORTABLE CHEST 1 VIEW.  Patient is rotated. COMPARISON:  Chest x-ray 09/10/2021, CT angiography chest 12/03/2018 FINDINGS: The heart and mediastinal contours are unchanged.  Aortic calcification. Redemonstration of a slightly more conspicuous 64mm left apical calcified nodular-like density. Query vague right lower lobe airspace opacity. Chronic coarsened interstitial markings with no overt pulmonary edema. No pleural effusion. No pneumothorax. No acute osseous abnormality. IMPRESSION: 1. Query vague right lower lobe airspace opacity. Limited evaluation due to patient rotation. Followup PA and lateral chest X-ray is recommended in 3-4 weeks following therapy to ensure resolution and exclude underlying malignancy. 2. Slightly more conspicuous left apical calcified nodular-like density. Electronically Signed   By: Tish Frederickson M.D.   On: 12/22/2021 20:15   ? ?Microbiology: ?Results for orders placed or performed during the hospital encounter of 12/22/21  ?Resp Panel by RT-PCR (Flu A&B,  Covid) Nasopharyngeal Swab     Status: Abnormal  ? Collection Time: 12/22/21  8:30 PM  ? Specimen: Nasopharyngeal Swab; Nasopharyngeal(NP) swabs in vial transport medium  ?Result Value Ref Range Status  ? SARS Coronavir

## 2021-12-24 NOTE — Care Management Important Message (Signed)
Important Message ? ?Patient Details  ?Name: Deanna Acosta ?MRN: [id number] ?Date of Birth: 1935/12/07 ? ? ?Medicare Important Message Given:  N/A - LOS <3 / Initial given by admissions ? ? ? ? ?Corey Harold ?12/24/2021, 11:50 AM ?

## 2021-12-27 LAB — CULTURE, BLOOD (ROUTINE X 2)
Culture: NO GROWTH
Culture: NO GROWTH
Special Requests: ADEQUATE
Special Requests: ADEQUATE

## 2022-02-24 ENCOUNTER — Other Ambulatory Visit: Payer: Self-pay | Admitting: Family Medicine

## 2022-02-24 DIAGNOSIS — M810 Age-related osteoporosis without current pathological fracture: Secondary | ICD-10-CM

## 2022-02-25 ENCOUNTER — Other Ambulatory Visit: Payer: Self-pay | Admitting: Family Medicine

## 2022-02-25 DIAGNOSIS — Z1231 Encounter for screening mammogram for malignant neoplasm of breast: Secondary | ICD-10-CM

## 2022-04-09 ENCOUNTER — Ambulatory Visit
Admission: RE | Admit: 2022-04-09 | Discharge: 2022-04-09 | Disposition: A | Discharge status: Home / Self Care | Payer: Medicare Other | Source: Ambulatory Visit | Attending: Family Medicine | Admitting: Family Medicine

## 2022-04-09 DIAGNOSIS — Z1231 Encounter for screening mammogram for malignant neoplasm of breast: Secondary | ICD-10-CM

## 2022-05-10 ENCOUNTER — Emergency Department (HOSPITAL_COMMUNITY)
Admission: EM | Admit: 2022-05-10 | Discharge: 2022-05-10 | Disposition: A | Discharge status: Home / Self Care | Payer: Medicare Other | Attending: Emergency Medicine | Admitting: Emergency Medicine

## 2022-05-10 ENCOUNTER — Other Ambulatory Visit: Payer: Self-pay

## 2022-05-10 ENCOUNTER — Encounter (HOSPITAL_COMMUNITY): Payer: Self-pay | Admitting: Emergency Medicine

## 2022-05-10 DIAGNOSIS — N189 Chronic kidney disease, unspecified: Secondary | ICD-10-CM | POA: Insufficient documentation

## 2022-05-10 DIAGNOSIS — Z96651 Presence of right artificial knee joint: Secondary | ICD-10-CM | POA: Diagnosis not present

## 2022-05-10 DIAGNOSIS — I129 Hypertensive chronic kidney disease with stage 1 through stage 4 chronic kidney disease, or unspecified chronic kidney disease: Secondary | ICD-10-CM | POA: Insufficient documentation

## 2022-05-10 DIAGNOSIS — J449 Chronic obstructive pulmonary disease, unspecified: Secondary | ICD-10-CM | POA: Insufficient documentation

## 2022-05-10 DIAGNOSIS — Z87891 Personal history of nicotine dependence: Secondary | ICD-10-CM | POA: Diagnosis not present

## 2022-05-10 DIAGNOSIS — T63464A Toxic effect of venom of wasps, undetermined, initial encounter: Secondary | ICD-10-CM | POA: Diagnosis present

## 2022-05-10 DIAGNOSIS — J45909 Unspecified asthma, uncomplicated: Secondary | ICD-10-CM | POA: Diagnosis not present

## 2022-05-10 DIAGNOSIS — Z96652 Presence of left artificial knee joint: Secondary | ICD-10-CM | POA: Insufficient documentation

## 2022-05-10 MED ORDER — FAMOTIDINE 20 MG PO TABS
20.0000 mg | ORAL_TABLET | Freq: Once | ORAL | Status: AC
  Administered 2022-05-10: 20 mg via ORAL
  Filled 2022-05-10: qty 1

## 2022-05-10 MED ORDER — DIPHENHYDRAMINE HCL 25 MG PO TABS: 25.0000 mg | ORAL_TABLET | Freq: Four times a day (QID) | ORAL | 0 refills | Status: DC

## 2022-05-10 MED ORDER — FAMOTIDINE 20 MG PO TABS: 20.0000 mg | ORAL_TABLET | Freq: Two times a day (BID) | ORAL | 0 refills | Status: DC

## 2022-05-10 MED ORDER — OXYCODONE HCL 5 MG PO TABS
5.0000 mg | ORAL_TABLET | Freq: Once | ORAL | Status: AC
  Administered 2022-05-10: 5 mg via ORAL
  Filled 2022-05-10: qty 1

## 2022-05-10 NOTE — ED Triage Notes (Signed)
Pt states she was stung by a yellowjacket on her R middle finger yesterday around 4pm and that she "cannot get the pain to go away. Pt states "I'm hoping that I can get a shot and be done with it".
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
## 2022-05-10 NOTE — Discharge Instructions (Signed)
You were evaluated in the Emergency Department and after careful evaluation, we did not find any emergent condition requiring admission or further testing in the hospital.  Your exam/testing today is overall reassuring.  Symptoms seem to be due to local inflammation from the sting.  Recommend cold compresses, Benadryl every 4-6 hours as needed, famotidine every 12 hours as needed.  Please return to the Emergency Department if you experience any worsening of your condition.   Thank you for allowing [place] to be a part of your care.

## 2022-07-19 ENCOUNTER — Emergency Department (HOSPITAL_COMMUNITY)
Admission: EM | Admit: 2022-07-19 | Discharge: 2022-07-19 | Disposition: A | Discharge status: Home / Self Care | Payer: Medicare Other | Attending: Emergency Medicine | Admitting: Emergency Medicine

## 2022-07-19 DIAGNOSIS — R1030 Lower abdominal pain, unspecified: Secondary | ICD-10-CM | POA: Insufficient documentation

## 2022-07-19 DIAGNOSIS — Z79899 Other long term (current) drug therapy: Secondary | ICD-10-CM | POA: Diagnosis not present

## 2022-07-19 DIAGNOSIS — K219 Gastro-esophageal reflux disease without esophagitis: Secondary | ICD-10-CM | POA: Insufficient documentation

## 2022-07-19 DIAGNOSIS — H9202 Otalgia, left ear: Secondary | ICD-10-CM | POA: Diagnosis not present

## 2022-07-19 DIAGNOSIS — I129 Hypertensive chronic kidney disease with stage 1 through stage 4 chronic kidney disease, or unspecified chronic kidney disease: Secondary | ICD-10-CM | POA: Diagnosis not present

## 2022-07-19 DIAGNOSIS — Z7951 Long term (current) use of inhaled steroids: Secondary | ICD-10-CM | POA: Diagnosis not present

## 2022-07-19 DIAGNOSIS — R0981 Nasal congestion: Secondary | ICD-10-CM | POA: Insufficient documentation

## 2022-07-19 DIAGNOSIS — E876 Hypokalemia: Secondary | ICD-10-CM | POA: Diagnosis not present

## 2022-07-19 DIAGNOSIS — Z7982 Long term (current) use of aspirin: Secondary | ICD-10-CM | POA: Diagnosis not present

## 2022-07-19 DIAGNOSIS — N183 Chronic kidney disease, stage 3 unspecified: Secondary | ICD-10-CM | POA: Diagnosis not present

## 2022-07-19 DIAGNOSIS — J449 Chronic obstructive pulmonary disease, unspecified: Secondary | ICD-10-CM | POA: Insufficient documentation

## 2022-07-19 DIAGNOSIS — R197 Diarrhea, unspecified: Secondary | ICD-10-CM | POA: Diagnosis present

## 2022-07-19 LAB — CBC WITH DIFFERENTIAL/PLATELET
Abs Immature Granulocytes: 0.08 10*3/uL — ABNORMAL HIGH (ref 0.00–0.07)
Basophils Absolute: 0.1 10*3/uL (ref 0.0–0.1)
Basophils Relative: 1 %
Eosinophils Absolute: 0.4 10*3/uL (ref 0.0–0.5)
Eosinophils Relative: 3 %
HCT: 38.9 % (ref 36.0–46.0)
Hemoglobin: 13.2 g/dL (ref 12.0–15.0)
Immature Granulocytes: 1 %
Lymphocytes Relative: 14 %
Lymphs Abs: 1.6 10*3/uL (ref 0.7–4.0)
MCH: 30.1 pg (ref 26.0–34.0)
MCHC: 33.9 g/dL (ref 30.0–36.0)
MCV: 88.8 fL (ref 80.0–100.0)
Monocytes Absolute: 1.2 10*3/uL — ABNORMAL HIGH (ref 0.1–1.0)
Monocytes Relative: 11 %
Neutro Abs: 8.3 10*3/uL — ABNORMAL HIGH (ref 1.7–7.7)
Neutrophils Relative %: 70 %
Platelets: 289 10*3/uL (ref 150–400)
RBC: 4.38 MIL/uL (ref 3.87–5.11)
RDW: 13.2 % (ref 11.5–15.5)
WBC: 11.7 10*3/uL — ABNORMAL HIGH (ref 4.0–10.5)
nRBC: 0 % (ref 0.0–0.2)

## 2022-07-19 LAB — URINALYSIS, ROUTINE W REFLEX MICROSCOPIC
Bilirubin Urine: NEGATIVE
Glucose, UA: NEGATIVE mg/dL
Hgb urine dipstick: NEGATIVE
Ketones, ur: NEGATIVE mg/dL
Nitrite: NEGATIVE
Protein, ur: NEGATIVE mg/dL
Specific Gravity, Urine: 1.008 (ref 1.005–1.030)
pH: 5 (ref 5.0–8.0)

## 2022-07-19 LAB — C DIFFICILE QUICK SCREEN W PCR REFLEX
C Diff antigen: POSITIVE — AB
C Diff toxin: NEGATIVE

## 2022-07-19 LAB — COMPREHENSIVE METABOLIC PANEL
ALT: 16 U/L (ref 0–44)
AST: 26 U/L (ref 15–41)
Albumin: 3.1 g/dL — ABNORMAL LOW (ref 3.5–5.0)
Alkaline Phosphatase: 93 U/L (ref 38–126)
Anion gap: 10 (ref 5–15)
BUN: 27 mg/dL — ABNORMAL HIGH (ref 8–23)
CO2: 19 mmol/L — ABNORMAL LOW (ref 22–32)
Calcium: 8.7 mg/dL — ABNORMAL LOW (ref 8.9–10.3)
Chloride: 106 mmol/L (ref 98–111)
Creatinine, Ser: 1.43 mg/dL — ABNORMAL HIGH (ref 0.44–1.00)
GFR, Estimated: 36 mL/min — ABNORMAL LOW (ref >60)
Glucose, Bld: 121 mg/dL — ABNORMAL HIGH (ref 70–99)
Potassium: 3.1 mmol/L — ABNORMAL LOW (ref 3.5–5.1)
Sodium: 135 mmol/L (ref 135–145)
Total Bilirubin: 0.5 mg/dL (ref 0.3–1.2)
Total Protein: 6.7 g/dL (ref 6.5–8.1)

## 2022-07-19 LAB — CLOSTRIDIUM DIFFICILE BY PCR, REFLEXED: Toxigenic C. Difficile by PCR: POSITIVE — AB

## 2022-07-19 MED ORDER — SODIUM CHLORIDE 0.9 % IV BOLUS
1000.0000 mL | Freq: Once | INTRAVENOUS | Status: AC
  Administered 2022-07-19: 1000 mL via INTRAVENOUS

## 2022-07-19 MED ORDER — DIPHENOXYLATE-ATROPINE 2.5-0.025 MG PO TABS: 1.0000 | ORAL_TABLET | Freq: Four times a day (QID) | ORAL | 0 refills | Status: DC | PRN

## 2022-07-19 MED ORDER — POTASSIUM CHLORIDE 10 MEQ/100ML IV SOLN
10.0000 meq | Freq: Once | INTRAVENOUS | Status: AC
Start: 2022-07-19 — End: 2022-07-19
  Administered 2022-07-19: 10 meq via INTRAVENOUS
  Filled 2022-07-19: qty 100

## 2022-07-19 MED ORDER — ONDANSETRON HCL 4 MG/2ML IJ SOLN
4.0000 mg | Freq: Once | INTRAMUSCULAR | Status: AC
  Administered 2022-07-19: 4 mg via INTRAVENOUS
  Filled 2022-07-19: qty 2

## 2022-07-19 MED ORDER — POTASSIUM CHLORIDE CRYS ER 20 MEQ PO TBCR: 20.0000 meq | EXTENDED_RELEASE_TABLET | Freq: Two times a day (BID) | ORAL | 0 refills | Status: DC

## 2022-07-19 MED ORDER — DIPHENOXYLATE-ATROPINE 2.5-0.025 MG PO TABS
2.0000 | ORAL_TABLET | Freq: Once | ORAL | Status: AC
  Administered 2022-07-19: 2 via ORAL
  Filled 2022-07-19: qty 2

## 2022-07-19 MED ORDER — POTASSIUM CHLORIDE CRYS ER 20 MEQ PO TBCR
40.0000 meq | EXTENDED_RELEASE_TABLET | Freq: Once | ORAL | Status: AC
Start: 2022-07-19 — End: 2022-07-19
  Administered 2022-07-19: 40 meq via ORAL
  Filled 2022-07-19: qty 2

## 2022-07-19 NOTE — ED Provider Notes (Signed)
[facility] Provider Note   CSN: [id number] Arrival date & time: 07/19/22  [id number]     History  Chief Complaint  Patient presents with   Diarrhea    Deanna Acosta is a 86 y.o. female with a history significant for COPD, hypertension, chronic kidney disease stage III and GERD who was treated for 7 days ago for a left otitis media, stating she took 3 days of amoxicillin despite being penicillin allergic.  Within 3 days she developed uncontrolled diarrhea which has not responded to OTC medications including Imodium, Pepto-Bismol and another generic antidiarrheal medication.  She has stopped the antibiotic.  She still endorses left ear pressure, although also states she has been battling some sinus congestion as well, denies fevers or chills, headache, chest pain, shortness of breath beyond her baseline.  She did have some nausea with 1 episode of emesis yesterday, the symptom has resolved.  She endorses generalized weakness and fatigue.  She has had no bloody stools, stating she is passing "just water" anytime she tries to eat or drink anything.  She does report some low abdominal cramping which improves after a bowel movement.  She reports 6 or 7 episodes of diarrhea since 8 PM yesterday evening.  The history is provided by the patient and the spouse.       Home Medications Prior to Admission medications   Medication Sig Start Date End Date Taking? Authorizing Provider  acetaminophen (TYLENOL) 500 MG tablet Take 1,000 mg by mouth every 6 (six) hours as needed for mild pain.   Yes [provider]  ADVAIR HFA 115-21 MCG/ACT inhaler Inhale 2 puffs into the lungs 2 (two) times daily. 12/02/21  Yes [provider]  albuterol (PROVENTIL HFA;VENTOLIN HFA) 108 (90 Base) MCG/ACT inhaler Inhale 2 puffs into the lungs every 6 (six) hours as needed for wheezing or shortness of breath.    Yes [provider]  albuterol (PROVENTIL) (2.5 MG/3ML) 0.083%  nebulizer solution Take 3 mLs (2.5 mg total) by nebulization every 4 (four) hours as needed for wheezing or shortness of breath. 12/24/21 12/24/22 Yes Kathie Dike, MD  aspirin 325 MG tablet Take 325 mg by mouth daily.   Yes [provider]  cholecalciferol (VITAMIN D3) 25 MCG (1000 UNIT) tablet Take 1,000 Units by mouth daily.   Yes [provider]  diltiazem (CARDIZEM CD) 120 MG 24 hr capsule Take 120 mg by mouth daily.  08/01/14  Yes [provider]  diphenhydrAMINE (BENADRYL) 25 MG tablet Take 1 tablet (25 mg total) by mouth every 6 (six) hours. Patient taking differently: Take 25 mg by mouth every 6 (six) hours as needed for allergies. 05/10/22  Yes Maudie Flakes, MD  diphenoxylate-atropine (LOMOTIL) 2.5-0.025 MG tablet Take 1-2 tablets by mouth 4 (four) times daily as needed for diarrhea or loose stools. 07/19/22  Yes Saydi Kobel, Almyra Free, PA-C  ferrous sulfate 325 (65 FE) MG tablet Take 325 mg by mouth daily with breakfast.   Yes [provider]  FLUoxetine (PROZAC) 40 MG capsule Take 40 mg by mouth daily before breakfast.    Yes [provider]  hydrochlorothiazide (HYDRODIURIL) 25 MG tablet Take 25 mg by mouth daily. 06/15/17  Yes [provider]  metoprolol succinate (TOPROL-XL) 100 MG 24 hr tablet Take 100 mg by mouth daily. 10/04/21  Yes [provider]  omeprazole (PRILOSEC) 40 MG capsule Take 40 mg by mouth daily.   Yes [provider]  potassium chloride SA (KLOR-CON M)  20 MEQ tablet Take 1 tablet (20 mEq total) by mouth 2 (two) times daily. 07/19/22  Yes Garwood Wentzell, Almyra Free, PA-C  rosuvastatin (CRESTOR) 5 MG tablet Take 5 mg by mouth daily. 11/12/21  Yes [provider]  cefdinir (OMNICEF) 300 MG capsule Take 300 mg by mouth 2 (two) times daily. Started on 07-12-22 7 DS Stopped on 07-15-22 07/12/22   [provider]  famotidine (PEPCID) 20 MG tablet Take 1 tablet (20 mg total) by mouth 2 (two) times daily. Patient  not taking: Reported on 07/19/2022 05/10/22   Maudie Flakes, MD      Allergies    Cortisone, Penicillins, Losartan, Other, Tape, and Levofloxacin    Review of Systems   Review of Systems  Constitutional:  Negative for chills and fever.  HENT:  Positive for sinus pressure. Negative for sore throat.   Eyes: Negative.   Respiratory:  Negative for chest tightness and shortness of breath.   Cardiovascular:  Negative for chest pain.  Gastrointestinal:  Positive for abdominal pain, diarrhea, nausea and vomiting. Negative for abdominal distention.  Genitourinary: Negative.   Musculoskeletal:  Negative for arthralgias, joint swelling and neck pain.  Skin: Negative.  Negative for rash and wound.  Neurological:  Positive for weakness. Negative for dizziness, light-headedness, numbness and headaches.  Psychiatric/Behavioral: Negative.      Physical Exam Updated Vital Signs BP 117/63 (BP Location: Right Arm)   Pulse 64   Temp 97.6 F (36.4 C) (Oral)   Resp 18   Ht 5\' 4"  (1.626 m)   Wt 82 kg   SpO2 95%   BMI 31.03 kg/m  Physical Exam Vitals and nursing note reviewed.  Constitutional:      Appearance: She is well-developed.  HENT:     Head: Normocephalic and atraumatic.     Right Ear: Tympanic membrane and ear canal normal.     Left Ear: Tympanic membrane and ear canal normal.     Mouth/Throat:     Mouth: Mucous membranes are dry.  Eyes:     Conjunctiva/sclera: Conjunctivae normal.  Cardiovascular:     Rate and Rhythm: Normal rate and regular rhythm.     Heart sounds: Normal heart sounds.  Pulmonary:     Effort: Pulmonary effort is normal.     Breath sounds: Normal breath sounds. No wheezing.  Abdominal:     General: Bowel sounds are increased.     Palpations: Abdomen is soft.     Tenderness: There is no abdominal tenderness. There is no guarding or rebound.     Comments: Patient reports mild soreness in her lower abdomen with deep palpation.  No guarding or rebound.  No  distention.  Musculoskeletal:        General: Normal range of motion.     Cervical back: Normal range of motion.  Skin:    General: Skin is warm and dry.  Neurological:     Mental Status: She is alert.     ED Results / Procedures / Treatments   Labs (all labs ordered are listed, but only abnormal results are displayed) Labs Reviewed  C DIFFICILE QUICK SCREEN W PCR REFLEX   - Abnormal; Notable for the following components:      Result Value   C Diff antigen POSITIVE (*)    All other components within normal limits  CBC WITH DIFFERENTIAL/PLATELET - Abnormal; Notable for the following components:   WBC 11.7 (*)    Neutro Abs 8.3 (*)    Monocytes Absolute 1.2 (*)  Abs Immature Granulocytes 0.08 (*)    All other components within normal limits  COMPREHENSIVE METABOLIC PANEL - Abnormal; Notable for the following components:   Potassium 3.1 (*)    CO2 19 (*)    Glucose, Bld 121 (*)    BUN 27 (*)    Creatinine, Ser 1.43 (*)    Calcium 8.7 (*)    Albumin 3.1 (*)    GFR, Estimated 36 (*)    All other components within normal limits  URINALYSIS, ROUTINE W REFLEX MICROSCOPIC - Abnormal; Notable for the following components:   APPearance HAZY (*)    Leukocytes,Ua LARGE (*)    Bacteria, UA FEW (*)    All other components within normal limits  CLOSTRIDIUM DIFFICILE BY PCR, REFLEXED    EKG None  Radiology No results found.  Procedures Procedures    Medications Ordered in ED Medications  sodium chloride 0.9 % bolus 1,000 mL (1,000 mLs Intravenous Bolus 07/19/22 0940)  potassium chloride SA (KLOR-CON M) CR tablet 40 mEq (40 mEq Oral Given 07/19/22 1017)  potassium chloride 10 mEq in 100 mL IVPB (0 mEq Intravenous Stopped 07/19/22 1134)  ondansetron (ZOFRAN) injection 4 mg (4 mg Intravenous Given 07/19/22 1104)  diphenoxylate-atropine (LOMOTIL) 2.5-0.025 MG per tablet 2 tablet (2 tablets Oral Given 07/19/22 1457)    ED Course/ Medical Decision Making/ A&P Clinical Course  as of 07/19/22 1519  Sat Jul 19, 2022  1016 Labs significant for hypokalemia 3.1.  Oral and IV ordered to replace. [JI]    Clinical Course User Index [JI] Landis Martins                           Medical Decision Making Pt presenting with 3 day history of uncontrolled diarrhea after being on amoxil x 3 days (which she stopped) for otitis media, no exam findings to suggest ongoing otitis.  Increased weakness, no n/v, no fever.  Mild abdominal cramping.  No blood in stool.    Benign abd, no indication for imaging.  Patient was given IV fluids after which she felt improved.  She was ambulatory in the department without symptoms.  She was actually symptom-free from a diarrhea perspective, however it returned approximately 2 PM.  She will be prescribed Lomotil for better control of the symptoms, first dose was given here.  Patient is motivated for dispo home.  Her vital signs while here have been stable except for her last blood pressure at 1430 of 88/71, which appears to be an outlier.  We will repeat after ambulation.  She is not tachycardic and is asymptomatic with no lightheadedness or weakness at present.  Remains afebrile.  Repeat bp 117/63, again, asymptomatic, no lightheadedness, weakness improved.     Amount and/or Complexity of Data Reviewed Labs: ordered.    Details: Significant for hypokalemia with K+ 3.1 - replaced with oral and IV tx.  Additional labs, C dif antigen positive, but toxin negative, suggesting pt chronically carries c dif but not the source of todays sx as no toxin is present. PCR pending.       Risk Prescription drug management. Decision regarding hospitalization.           Final Clinical Impression(s) / ED Diagnoses Final diagnoses:  Diarrhea, unspecified type  Hypokalemia    Rx / DC Orders ED Discharge Orders          Ordered    diphenoxylate-atropine (LOMOTIL) 2.5-0.025 MG tablet  4 times daily PRN  07/19/22 1449    potassium chloride SA  (KLOR-CON M) 20 MEQ tablet  2 times daily        07/19/22 1449              Evalee Jefferson, Hershal Coria 07/19/22 1519    Wyvonnia Dusky, MD 07/19/22 [phone]

## 2022-07-19 NOTE — ED Triage Notes (Signed)
Pt c/o diarrhea x 4 days and congestion and bilateral ear congestion. Pt states she started on penicillin for her ears and has experienced diarrhea since

## 2022-07-19 NOTE — ED Notes (Addendum)
Pt given oral fluids to drink, informed pt of need for urine and stool specimen

## 2022-07-19 NOTE — Discharge Instructions (Signed)
Take the lomotil if needed for any additional diarrhea episodes as discussed.  You can safely take up to 8 tablets per day.  I recommend a recheck by your primary MD or returning here if your symptoms are not improving or if they are worsening in any way.  Make sure you are taking plenty of fluids.  Your potassium is low today which is a side effect of diarrhea, so this is being prescribed as well to replace what you have lost.

## 2022-07-19 NOTE — ED Notes (Signed)
Pts BP was 117/63 after ambulating--PA-C made aware

## 2022-07-19 NOTE — ED Notes (Signed)
BP 88/71 after ambulating.

## 2022-07-20 ENCOUNTER — Emergency Department (HOSPITAL_COMMUNITY)
Admission: EM | Admit: 2022-07-20 | Discharge: 2022-07-20 | Disposition: A | Discharge status: Home / Self Care | Payer: Medicare Other | Attending: Emergency Medicine | Admitting: Emergency Medicine

## 2022-07-20 ENCOUNTER — Emergency Department (HOSPITAL_COMMUNITY): Payer: Medicare Other

## 2022-07-20 ENCOUNTER — Encounter (HOSPITAL_COMMUNITY): Payer: Self-pay | Admitting: Emergency Medicine

## 2022-07-20 DIAGNOSIS — R109 Unspecified abdominal pain: Secondary | ICD-10-CM | POA: Insufficient documentation

## 2022-07-20 DIAGNOSIS — J449 Chronic obstructive pulmonary disease, unspecified: Secondary | ICD-10-CM | POA: Insufficient documentation

## 2022-07-20 DIAGNOSIS — R4182 Altered mental status, unspecified: Secondary | ICD-10-CM | POA: Insufficient documentation

## 2022-07-20 DIAGNOSIS — A0472 Enterocolitis due to Clostridium difficile, not specified as recurrent: Secondary | ICD-10-CM

## 2022-07-20 DIAGNOSIS — Z79899 Other long term (current) drug therapy: Secondary | ICD-10-CM | POA: Insufficient documentation

## 2022-07-20 DIAGNOSIS — I10 Essential (primary) hypertension: Secondary | ICD-10-CM | POA: Insufficient documentation

## 2022-07-20 DIAGNOSIS — R197 Diarrhea, unspecified: Secondary | ICD-10-CM | POA: Insufficient documentation

## 2022-07-20 LAB — COMPREHENSIVE METABOLIC PANEL
ALT: 14 U/L (ref 0–44)
AST: 16 U/L (ref 15–41)
Albumin: 3 g/dL — ABNORMAL LOW (ref 3.5–5.0)
Alkaline Phosphatase: 92 U/L (ref 38–126)
Anion gap: 10 (ref 5–15)
BUN: 19 mg/dL (ref 8–23)
CO2: 19 mmol/L — ABNORMAL LOW (ref 22–32)
Calcium: 9 mg/dL (ref 8.9–10.3)
Chloride: 104 mmol/L (ref 98–111)
Creatinine, Ser: 1.51 mg/dL — ABNORMAL HIGH (ref 0.44–1.00)
GFR, Estimated: 34 mL/min — ABNORMAL LOW (ref >60)
Glucose, Bld: 110 mg/dL — ABNORMAL HIGH (ref 70–99)
Potassium: 3.8 mmol/L (ref 3.5–5.1)
Sodium: 133 mmol/L — ABNORMAL LOW (ref 135–145)
Total Bilirubin: 0.5 mg/dL (ref 0.3–1.2)
Total Protein: 6.6 g/dL (ref 6.5–8.1)

## 2022-07-20 LAB — CBC WITH DIFFERENTIAL/PLATELET
Abs Immature Granulocytes: 0.07 10*3/uL (ref 0.00–0.07)
Basophils Absolute: 0.1 10*3/uL (ref 0.0–0.1)
Basophils Relative: 0 %
Eosinophils Absolute: 0.2 10*3/uL (ref 0.0–0.5)
Eosinophils Relative: 1 %
HCT: 40.4 % (ref 36.0–46.0)
Hemoglobin: 13.4 g/dL (ref 12.0–15.0)
Immature Granulocytes: 1 %
Lymphocytes Relative: 9 %
Lymphs Abs: 1.4 10*3/uL (ref 0.7–4.0)
MCH: 30 pg (ref 26.0–34.0)
MCHC: 33.2 g/dL (ref 30.0–36.0)
MCV: 90.6 fL (ref 80.0–100.0)
Monocytes Absolute: 2 10*3/uL — ABNORMAL HIGH (ref 0.1–1.0)
Monocytes Relative: 13 %
Neutro Abs: 11.5 10*3/uL — ABNORMAL HIGH (ref 1.7–7.7)
Neutrophils Relative %: 76 %
Platelets: 337 10*3/uL (ref 150–400)
RBC: 4.46 MIL/uL (ref 3.87–5.11)
RDW: 13.2 % (ref 11.5–15.5)
WBC: 15.2 10*3/uL — ABNORMAL HIGH (ref 4.0–10.5)
nRBC: 0 % (ref 0.0–0.2)

## 2022-07-20 LAB — URINALYSIS, ROUTINE W REFLEX MICROSCOPIC
Bilirubin Urine: NEGATIVE
Glucose, UA: NEGATIVE mg/dL
Ketones, ur: NEGATIVE mg/dL
Nitrite: NEGATIVE
Protein, ur: NEGATIVE mg/dL
Specific Gravity, Urine: 1.025 (ref 1.005–1.030)
pH: 5.5 (ref 5.0–8.0)

## 2022-07-20 LAB — URINALYSIS, MICROSCOPIC (REFLEX)

## 2022-07-20 LAB — LACTIC ACID, PLASMA: Lactic Acid, Venous: 1.1 mmol/L (ref 0.5–1.9)

## 2022-07-20 LAB — LIPASE, BLOOD: Lipase: 319 U/L — ABNORMAL HIGH (ref 11–51)

## 2022-07-20 MED ORDER — ONDANSETRON HCL 4 MG/2ML IJ SOLN
4.0000 mg | Freq: Once | INTRAMUSCULAR | Status: AC
  Administered 2022-07-20: 4 mg via INTRAVENOUS
  Filled 2022-07-20: qty 2

## 2022-07-20 MED ORDER — VANCOMYCIN HCL 125 MG PO CAPS
125.0000 mg | ORAL_CAPSULE | Freq: Four times a day (QID) | ORAL | Status: DC
  Administered 2022-07-20 (×2): 125 mg via ORAL
  Filled 2022-07-20 (×3): qty 1

## 2022-07-20 MED ORDER — VANCOMYCIN HCL 125 MG PO CAPS: 125.0000 mg | ORAL_CAPSULE | Freq: Four times a day (QID) | ORAL | 0 refills | Status: AC

## 2022-07-20 MED ORDER — IOHEXOL 350 MG/ML SOLN
60.0000 mL | Freq: Once | INTRAVENOUS | Status: AC | PRN
  Administered 2022-07-20: 60 mL via INTRAVENOUS

## 2022-07-20 MED ORDER — LACTATED RINGERS IV BOLUS
1000.0000 mL | Freq: Once | INTRAVENOUS | Status: AC
  Administered 2022-07-20: 1000 mL via INTRAVENOUS

## 2022-07-20 NOTE — ED Triage Notes (Signed)
Patient here with complaint of generalized weakness, diarrhea, and vomiting for the last several days. Seen for same at [facility] yesterday. Prescribed lomotil but patient here stating no improvement in symptoms.

## 2022-07-20 NOTE — Discharge Instructions (Addendum)
Your testing for C. difficile is positive.  This is an infectious diarrhea.  You are being prescribed specific antibiotics for this.  Even if you start feeling better you need to complete the course.  You are also being referred to infectious disease and you should call your primary care physician as well.  If you develop fever, vomiting, blood in the stool, abdominal pain, or any other new/concerning symptoms then return to the ER for evaluation.

## 2022-07-20 NOTE — ED Provider Triage Note (Signed)
Emergency Medicine Provider Triage Evaluation Note  Deanna Acosta , a 86 y.o. female  was evaluated in triage.  Pt complains of abdominal pain, diarrhea for several days.  Last seen at [facility] emergency department last night.  C. difficile antigen positive but toxin negative.  Reports persistent symptoms.  Denies fever.  Without chest pain..  Review of Systems  Positive: As above Negative: As above  Physical Exam  BP 123/61 (BP Location: Right Arm)   Pulse 81   Temp 98 F (36.7 C)   Resp 18   SpO2 96%  Gen:   Awake, no distress   Resp:  Normal effort  MSK:   Moves extremities without difficulty  Other:    Medical Decision Making  Medically screening exam initiated at 12:28 PM.  Appropriate orders placed.  Deanna Acosta was informed that the remainder of the evaluation will be completed by another provider, this initial triage assessment does not replace that evaluation, and the importance of remaining in the ED until their evaluation is complete.     Evlyn Courier, PA-C 07/20/22 1228

## 2022-07-20 NOTE — ED Provider Notes (Signed)
Patient care transferred to me. Patient's CT head and CT abd/pelvis are overall unremarkable. VS are stable. Discussed admission but she and husband want to go home. They feel like she can get around and go to the bathroom.  She will be discharged on oral vancomycin.  Otherwise, lactate is normal.  Leukocytosis is expected with an infection.  Creatinine is slightly worse than baseline but she has been given fluids.  Lipase is elevated but no pancreatitis on the CT.  At this point, I did offer admission but she declines and I think is reasonable to discharge her home with return precautions.   Sherwood Gambler, MD 07/20/22 1859

## 2022-07-20 NOTE — ED Provider Notes (Signed)
[facility] EMERGENCY DEPARTMENT Provider Note   CSN: [id number] Arrival date & time: 07/20/22  1142     History  Chief Complaint  Patient presents with   Diarrhea    Deanna Acosta is a 86 y.o. female.  Patient with a history of COPD, hypertension, hyperlipidemia, hiatal hernia presenting with 5 to 6-day history of diarrhea.  She reports everything she tries to eat go straight through her and she has diarrhea every time she tries to eat something about 5 or 6 times a day.  Diarrhea is nonbloody.  Has had some nausea and vomiting as well.  Subjective fever.  She was seen in [facility] yesterday for similar symptoms and found to be C. difficile positive by antigen but not toxin.  This was not treated. She was recently on antibiotics for an ear infection.  She was on amoxicillin for 3 days and stopped it about 1 week ago.  States since being at the emergency department last night she had increasing abdominal pain, distention, nausea with frequent diarrhea.  No vomiting today with did have some yesterday.  Chills but no documented fever.  No chest pain or shortness of breath.  No travel or sick contacts. She was been taking Lomotil at home without relief.  Yesterday C. difficile antigen was positive but toxin was negative however her PCR toxin test is positive today.  Family at bedside feels she has been intermittently confused over the past several days with intermittent slurred speech.  She says this is from her not having her false teeth in place.  The history is provided by the patient and a relative.  Diarrhea Associated symptoms: abdominal pain and vomiting   Associated symptoms: no fever and no headaches        Home Medications Prior to Admission medications   Medication Sig Start Date End Date Taking? Authorizing Provider  acetaminophen (TYLENOL) 500 MG tablet Take 1,000 mg by mouth every 6 (six) hours as needed for mild pain.    [provider]  ADVAIR [facility] 191-47 MCG/ACT inhaler Inhale 2 puffs into the lungs 2 (two) times daily. 12/02/21   [provider]  albuterol (PROVENTIL HFA;VENTOLIN HFA) 108 (90 Base) MCG/ACT inhaler Inhale 2 puffs into the lungs every 6 (six) hours as needed for wheezing or shortness of breath.     [provider]  albuterol (PROVENTIL) (2.5 MG/3ML) 0.083% nebulizer solution Take 3 mLs (2.5 mg total) by nebulization every 4 (four) hours as needed for wheezing or shortness of breath. 12/24/21 12/24/22  Kathie Dike, MD  aspirin 325 MG tablet Take 325 mg by mouth daily.    [provider]  cefdinir (OMNICEF) 300 MG capsule Take 300 mg by mouth 2 (two) times daily. Started on 07-12-22 7 DS Stopped on 07-15-22 07/12/22   [provider]  cholecalciferol (VITAMIN D3) 25 MCG (1000 UNIT) tablet Take 1,000 Units by mouth daily.    [provider]  diltiazem (CARDIZEM CD) 120 MG 24 hr capsule Take 120 mg by mouth daily.  08/01/14   [provider]  diphenhydrAMINE (BENADRYL) 25 MG tablet Take 1 tablet (25 mg total) by mouth every 6 (six) hours. Patient taking differently: Take 25 mg by mouth every 6 (six) hours as needed for allergies. 05/10/22   Maudie Flakes, MD  diphenoxylate-atropine (LOMOTIL) 2.5-0.025 MG tablet Take 1-2 tablets by mouth 4 (four) times daily as needed for diarrhea or loose stools. 07/19/22   Evalee Jefferson, PA-C  famotidine (PEPCID) 20 MG tablet Take 1 tablet (20 mg total) by mouth 2 (two) times daily. Patient not taking: Reported on 07/19/2022 05/10/22   Sabas Sous, MD  ferrous sulfate 325 (65 FE) MG tablet Take 325 mg by mouth daily with breakfast.    [provider]  FLUoxetine (PROZAC) 40 MG capsule Take 40 mg by mouth daily before breakfast.     [provider]  hydrochlorothiazide (HYDRODIURIL) 25 MG tablet Take 25 mg by mouth daily. 06/15/17   [provider]  metoprolol succinate (TOPROL-XL) 100 MG 24 hr tablet  Take 100 mg by mouth daily. 10/04/21   [provider]  omeprazole (PRILOSEC) 40 MG capsule Take 40 mg by mouth daily.    [provider]  potassium chloride SA (KLOR-CON M) 20 MEQ tablet Take 1 tablet (20 mEq total) by mouth 2 (two) times daily. 07/19/22   Burgess Amor, PA-C  rosuvastatin (CRESTOR) 5 MG tablet Take 5 mg by mouth daily. 11/12/21   [provider]      Allergies    Cortisone, Penicillins, Losartan, Other, Tape, and Levofloxacin    Review of Systems   Review of Systems  Constitutional:  Positive for activity change, appetite change and fatigue. Negative for fever.  HENT:  Negative for congestion.   Respiratory:  Negative for cough, chest tightness and shortness of breath.   Gastrointestinal:  Positive for abdominal distention, abdominal pain, diarrhea, nausea and vomiting.  Genitourinary:  Negative for dysuria and hematuria.  Neurological:  Positive for weakness. Negative for headaches.    Physical Exam Updated Vital Signs BP 123/61 (BP Location: Right Arm)   Pulse 81   Temp 98 F (36.7 C)   Resp 18   SpO2 96%  Physical Exam Vitals and nursing note reviewed.  Constitutional:      General: She is not in acute distress.    Appearance: She is well-developed.     Comments: No appreciable facial droop  HENT:     Head: Normocephalic and atraumatic.     Mouth/Throat:     Mouth: Mucous membranes are dry.     Pharynx: No oropharyngeal exudate.  Eyes:     Conjunctiva/sclera: Conjunctivae normal.     Pupils: Pupils are equal, round, and reactive to light.  Neck:     Comments: No meningismus. Cardiovascular:     Rate and Rhythm: Normal rate and regular rhythm.     Heart sounds: Normal heart sounds. No murmur heard. Pulmonary:     Effort: Pulmonary effort is normal. No respiratory distress.     Breath sounds: Normal breath sounds.  Abdominal:     Palpations: Abdomen is soft.     Tenderness: There is abdominal tenderness. There is no  guarding or rebound.     Comments: Mild diffuse tenderness, no guarding or rebound  Musculoskeletal:        General: No tenderness. Normal range of motion.     Cervical back: Normal range of motion and neck supple.  Skin:    General: Skin is warm.     Capillary Refill: Capillary refill takes less than 2 seconds.  Neurological:     General: No focal deficit present.     Mental Status: She is alert and oriented to person, place, and time. Mental status is at baseline.     Cranial Nerves: No cranial nerve deficit.     Motor: No abnormal muscle tone.     Coordination: Coordination normal.     Comments:  5/5 strength throughout. CN 2-12 intact.Equal grip strength.   Psychiatric:        Behavior: Behavior normal.     ED Results / Procedures / Treatments   Labs (all labs ordered are listed, but only abnormal results are displayed) Labs Reviewed  CBC WITH DIFFERENTIAL/PLATELET - Abnormal; Notable for the following components:      Result Value   WBC 15.2 (*)    Neutro Abs 11.5 (*)    Monocytes Absolute 2.0 (*)    All other components within normal limits  COMPREHENSIVE METABOLIC PANEL - Abnormal; Notable for the following components:   Sodium 133 (*)    CO2 19 (*)    Glucose, Bld 110 (*)    Creatinine, Ser 1.51 (*)    Albumin 3.0 (*)    GFR, Estimated 34 (*)    All other components within normal limits  LIPASE, BLOOD - Abnormal; Notable for the following components:   Lipase 319 (*)    All other components within normal limits  URINALYSIS, ROUTINE W REFLEX MICROSCOPIC - Abnormal; Notable for the following components:   Hgb urine dipstick TRACE (*)    Leukocytes,Ua MODERATE (*)    All other components within normal limits  URINALYSIS, MICROSCOPIC (REFLEX) - Abnormal; Notable for the following components:   Bacteria, UA RARE (*)    All other components within normal limits  LACTIC ACID, PLASMA  LACTIC ACID, PLASMA    EKG EKG Interpretation  Date/Time:  Sunday July 20 2022 14:55:23 EDT Ventricular Rate:  76 PR Interval:  190 QRS Duration: 89 QT Interval:  389 QTC Calculation: 438 R Axis:   -29 Text Interpretation: Sinus rhythm Borderline left axis deviation Abnormal R-wave progression, late transition No significant change was found Confirmed by Glynn Octave [phone]) on 07/20/2022 3:25:03 PM  Radiology No results found.  Procedures Procedures    Medications Ordered in ED Medications - No data to display  ED Course/ Medical Decision Making/ A&P                           Medical Decision Making Amount and/or Complexity of Data Reviewed Labs: ordered. Decision-making details documented in ED Course. Radiology: ordered and independent interpretation performed. Decision-making details documented in ED Course. ECG/medicine tests: ordered and independent interpretation performed. Decision-making details documented in ED Course.  Risk Prescription drug management.  Return visit for diarrhea for the past 6 days.  Vitals are stable, but diffusely tender.  Patient will be given IV hydration.  Will initiate treating her C. difficile with p.o. vancomycin.  Instructed to stop taking Lomotil and Imodium.  Labs today show leukocytosis of 15.  Slight AKI 1.5. Lipase 300. LFTs normal.  No significant upper abdominal pain.  We will obtain CT imaging to further evaluate her abdominal pain with diarrhea and likely C. difficile colitis. CT head also obtained given her intermittent episodes of slurred speech and confusion over the past several days.  Anticipate she will need admission for hydration, p.o. vancomycin and further evaluation of her recurrent diarrhea and abdominal pain.  Care to transferred at shift change with CT imaging pending.  Dr. Criss Alvine to assume care.         Final Clinical Impression(s) / ED Diagnoses Final diagnoses:  None    Rx / DC Orders ED Discharge Orders     None         Kiela Shisler, Jeannett Senior, MD 07/20/22  1551

## 2022-07-24 ENCOUNTER — Encounter (HOSPITAL_COMMUNITY): Payer: Self-pay | Admitting: Emergency Medicine

## 2022-07-24 ENCOUNTER — Other Ambulatory Visit: Payer: Self-pay

## 2022-07-24 ENCOUNTER — Emergency Department (HOSPITAL_COMMUNITY)
Admission: EM | Admit: 2022-07-24 | Discharge: 2022-07-26 | Disposition: A | Discharge status: Home / Self Care | Payer: Medicare Other | Attending: Emergency Medicine | Admitting: Emergency Medicine

## 2022-07-24 DIAGNOSIS — R109 Unspecified abdominal pain: Secondary | ICD-10-CM | POA: Diagnosis not present

## 2022-07-24 DIAGNOSIS — N189 Chronic kidney disease, unspecified: Secondary | ICD-10-CM | POA: Diagnosis not present

## 2022-07-24 DIAGNOSIS — A0472 Enterocolitis due to Clostridium difficile, not specified as recurrent: Secondary | ICD-10-CM | POA: Diagnosis not present

## 2022-07-24 DIAGNOSIS — I129 Hypertensive chronic kidney disease with stage 1 through stage 4 chronic kidney disease, or unspecified chronic kidney disease: Secondary | ICD-10-CM | POA: Insufficient documentation

## 2022-07-24 DIAGNOSIS — Z79899 Other long term (current) drug therapy: Secondary | ICD-10-CM | POA: Insufficient documentation

## 2022-07-24 DIAGNOSIS — Z7982 Long term (current) use of aspirin: Secondary | ICD-10-CM | POA: Insufficient documentation

## 2022-07-24 DIAGNOSIS — R197 Diarrhea, unspecified: Secondary | ICD-10-CM | POA: Diagnosis present

## 2022-07-24 LAB — CBC WITH DIFFERENTIAL/PLATELET
Abs Immature Granulocytes: 0.19 10*3/uL — ABNORMAL HIGH (ref 0.00–0.07)
Basophils Absolute: 0.1 10*3/uL (ref 0.0–0.1)
Basophils Relative: 1 %
Eosinophils Absolute: 0.3 10*3/uL (ref 0.0–0.5)
Eosinophils Relative: 3 %
HCT: 38.8 % (ref 36.0–46.0)
Hemoglobin: 12.6 g/dL (ref 12.0–15.0)
Immature Granulocytes: 2 %
Lymphocytes Relative: 21 %
Lymphs Abs: 2.2 10*3/uL (ref 0.7–4.0)
MCH: 29.7 pg (ref 26.0–34.0)
MCHC: 32.5 g/dL (ref 30.0–36.0)
MCV: 91.5 fL (ref 80.0–100.0)
Monocytes Absolute: 0.9 10*3/uL (ref 0.1–1.0)
Monocytes Relative: 9 %
Neutro Abs: 6.9 10*3/uL (ref 1.7–7.7)
Neutrophils Relative %: 64 %
Platelets: 373 10*3/uL (ref 150–400)
RBC: 4.24 MIL/uL (ref 3.87–5.11)
RDW: 13.3 % (ref 11.5–15.5)
WBC: 10.6 10*3/uL — ABNORMAL HIGH (ref 4.0–10.5)
nRBC: 0 % (ref 0.0–0.2)

## 2022-07-24 NOTE — ED Triage Notes (Signed)
Patient reports blood in her stool this afternoon with pain across lower abdomen , no fever or diarrhea .

## 2022-07-24 NOTE — ED Provider Triage Note (Signed)
Emergency Medicine Provider Triage Evaluation Note  Deanna Acosta , a 86 y.o. female  was evaluated in triage.  Pt complains of GI bleed.  Patient was seen here on 10/15 and was diagnosed with C. difficile colitis.  She was offered admission but she declined and went home with oral vancomycin.  She has been taking her medications.  She reports that she has been having increasing abdominal pain and earlier today when she had a bowel movement she had a toilet full of bright red blood.  She has felt lightheaded.  She denies being on any anticoagulation.  Review of Systems  Positive: As above Negative: As above Physical Exam  BP (!) 148/67   Pulse 66   Temp 97.7 F (36.5 C) (Oral)   Resp 19   SpO2 95%  Gen:   Awake, no distress   Resp:  Normal effort  MSK:   Moves extremities without difficulty  Other:  Generalized abdominal tenderness, distended  Medical Decision Making  Medically screening exam initiated at 10:48 PM.  Appropriate orders placed.  CODY OLIGER was informed that the remainder of the evaluation will be completed by another provider, this initial triage assessment does not replace that evaluation, and the importance of remaining in the ED until their evaluation is complete.     Garald Balding, PA-C 07/24/22 2249

## 2022-07-25 ENCOUNTER — Emergency Department (HOSPITAL_COMMUNITY): Payer: Medicare Other

## 2022-07-25 LAB — COMPREHENSIVE METABOLIC PANEL
ALT: 11 U/L (ref 0–44)
AST: 18 U/L (ref 15–41)
Albumin: 2.8 g/dL — ABNORMAL LOW (ref 3.5–5.0)
Alkaline Phosphatase: 78 U/L (ref 38–126)
Anion gap: 7 (ref 5–15)
BUN: 11 mg/dL (ref 8–23)
CO2: 22 mmol/L (ref 22–32)
Calcium: 9.3 mg/dL (ref 8.9–10.3)
Chloride: 110 mmol/L (ref 98–111)
Creatinine, Ser: 1.42 mg/dL — ABNORMAL HIGH (ref 0.44–1.00)
GFR, Estimated: 36 mL/min — ABNORMAL LOW (ref >60)
Glucose, Bld: 132 mg/dL — ABNORMAL HIGH (ref 70–99)
Potassium: 3.8 mmol/L (ref 3.5–5.1)
Sodium: 139 mmol/L (ref 135–145)
Total Bilirubin: 0.2 mg/dL — ABNORMAL LOW (ref 0.3–1.2)
Total Protein: 6.2 g/dL — ABNORMAL LOW (ref 6.5–8.1)

## 2022-07-25 LAB — URINALYSIS, ROUTINE W REFLEX MICROSCOPIC
Bilirubin Urine: NEGATIVE
Glucose, UA: NEGATIVE mg/dL
Hgb urine dipstick: NEGATIVE
Ketones, ur: NEGATIVE mg/dL
Nitrite: NEGATIVE
Protein, ur: NEGATIVE mg/dL
Specific Gravity, Urine: >1.03 — ABNORMAL HIGH (ref 1.005–1.030)
pH: 6 (ref 5.0–8.0)

## 2022-07-25 LAB — LACTIC ACID, PLASMA: Lactic Acid, Venous: 1.2 mmol/L (ref 0.5–1.9)

## 2022-07-25 LAB — URINALYSIS, MICROSCOPIC (REFLEX)

## 2022-07-25 LAB — LIPASE, BLOOD: Lipase: 352 U/L — ABNORMAL HIGH (ref 11–51)

## 2022-07-25 MED ORDER — SODIUM CHLORIDE 0.9 % IV BOLUS
1000.0000 mL | Freq: Once | INTRAVENOUS | Status: AC
  Administered 2022-07-26: 1000 mL via INTRAVENOUS

## 2022-07-25 MED ORDER — IOHEXOL 350 MG/ML SOLN
75.0000 mL | Freq: Once | INTRAVENOUS | Status: AC | PRN
  Administered 2022-07-25: 75 mL via INTRAVENOUS

## 2022-07-25 NOTE — ED Provider Notes (Signed)
[facility] EMERGENCY DEPARTMENT Provider Note   CSN: [id number] Arrival date & time: 07/24/22  2218     History {Add pertinent medical, surgical, social history, OB history to HPI:1} Chief Complaint  Patient presents with   Bloody Stool / Abdominal Pain     Deanna Acosta is a 86 y.o. female.  The history is provided by the patient and medical records.   86 y.o. F with hx of CKD, GERD, HTN, presenting to the ED for diarrhea.  Patient reports she was diagnosed with C. Diff 5 days ago, started on oral vancoymycin which she has been taking as directed.  Reports overall she feels like her condition is improving.  On 07/21/2022 she did have episode of bright red blood with bowel movement, which she was told may happen.  She has not had any further bleeding since that time.  States diarrhea seems to be improving.  She is trying to drink at least 6-8 bottles of water daily to keep yourself hydrated.  States she came back into the ED because she was called by a nurse from call who felt like she needed to be rechecked.  She has no acute complaints, no abdominal pain.  Home Medications Prior to Admission medications   Medication Sig Start Date End Date Taking? Authorizing Provider  acetaminophen (TYLENOL) 500 MG tablet Take 1,000 mg by mouth every 6 (six) hours as needed for mild pain.    [provider]  ADVAIR [facility] 814-48 MCG/ACT inhaler Inhale 2 puffs into the lungs 2 (two) times daily. 12/02/21   [provider]  albuterol (PROVENTIL HFA;VENTOLIN HFA) 108 (90 Base) MCG/ACT inhaler Inhale 2 puffs into the lungs every 6 (six) hours as needed for wheezing or shortness of breath.     [provider]  albuterol (PROVENTIL) (2.5 MG/3ML) 0.083% nebulizer solution Take 3 mLs (2.5 mg total) by nebulization every 4 (four) hours as needed for wheezing or shortness of breath. 12/24/21 12/24/22  Erick Blinks, MD  aspirin 325 MG tablet Take 325 mg by mouth  daily.    [provider]  cefdinir (OMNICEF) 300 MG capsule Take 300 mg by mouth 2 (two) times daily. Started on 07-12-22 7 DS Stopped on 07-15-22 07/12/22   [provider]  cholecalciferol (VITAMIN D3) 25 MCG (1000 UNIT) tablet Take 1,000 Units by mouth daily.    [provider]  diltiazem (CARDIZEM CD) 120 MG 24 hr capsule Take 120 mg by mouth daily.  08/01/14   [provider]  diphenhydrAMINE (BENADRYL) 25 MG tablet Take 1 tablet (25 mg total) by mouth every 6 (six) hours. Patient taking differently: Take 25 mg by mouth every 6 (six) hours as needed for allergies. 05/10/22   Sabas Sous, MD  diphenoxylate-atropine (LOMOTIL) 2.5-0.025 MG tablet Take 1-2 tablets by mouth 4 (four) times daily as needed for diarrhea or loose stools. 07/19/22   Burgess Amor, PA-C  famotidine (PEPCID) 20 MG tablet Take 1 tablet (20 mg total) by mouth 2 (two) times daily. Patient not taking: Reported on 07/19/2022 05/10/22   Sabas Sous, MD  ferrous sulfate 325 (65 FE) MG tablet Take 325 mg by mouth daily with breakfast.    [provider]  FLUoxetine (PROZAC) 40 MG capsule Take 40 mg by mouth daily before breakfast.     [provider]  hydrochlorothiazide (HYDRODIURIL) 25 MG tablet Take 25 mg by mouth daily. 06/15/17   [provider]  metoprolol succinate (TOPROL-XL) 100  MG 24 hr tablet Take 100 mg by mouth daily. 10/04/21   [provider]  omeprazole (PRILOSEC) 40 MG capsule Take 40 mg by mouth daily.    [provider]  potassium chloride SA (KLOR-CON M) 20 MEQ tablet Take 1 tablet (20 mEq total) by mouth 2 (two) times daily. 07/19/22   Evalee Jefferson, PA-C  rosuvastatin (CRESTOR) 5 MG tablet Take 5 mg by mouth daily. 11/12/21   [provider]  vancomycin (VANCOCIN) 125 MG capsule Take 1 capsule (125 mg total) by mouth 4 (four) times daily for 10 days. 07/20/22 07/30/22  Sherwood Gambler, MD      Allergies    Cortisone,  Penicillins, Losartan, Other, Tape, and Levofloxacin    Review of Systems   Review of Systems  Gastrointestinal:  Positive for diarrhea.  All other systems reviewed and are negative.   Physical Exam Updated Vital Signs BP 138/60   Pulse 70   Temp 97.6 F (36.4 C) (Oral)   Resp 20   SpO2 97%   Physical Exam Vitals and nursing note reviewed.  Constitutional:      Appearance: She is well-developed.  HENT:     Head: Normocephalic and atraumatic.  Eyes:     Conjunctiva/sclera: Conjunctivae normal.     Pupils: Pupils are equal, round, and reactive to light.  Cardiovascular:     Rate and Rhythm: Normal rate and regular rhythm.     Heart sounds: Normal heart sounds.  Pulmonary:     Effort: Pulmonary effort is normal.     Breath sounds: Normal breath sounds.  Abdominal:     General: Bowel sounds are normal.     Palpations: Abdomen is soft.     Tenderness: There is no abdominal tenderness.     Comments: Soft, nontender, no peritoneal signs  Musculoskeletal:        General: Normal range of motion.     Cervical back: Normal range of motion.  Skin:    General: Skin is warm and dry.  Neurological:     Mental Status: She is alert and oriented to person, place, and time.     ED Results / Procedures / Treatments   Labs (all labs ordered are listed, but only abnormal results are displayed) Labs Reviewed  CBC WITH DIFFERENTIAL/PLATELET - Abnormal; Notable for the following components:      Result Value   WBC 10.6 (*)    Abs Immature Granulocytes 0.19 (*)    All other components within normal limits  COMPREHENSIVE METABOLIC PANEL - Abnormal; Notable for the following components:   Glucose, Bld 132 (*)    Creatinine, Ser 1.42 (*)    Total Protein 6.2 (*)    Albumin 2.8 (*)    Total Bilirubin 0.2 (*)    GFR, Estimated 36 (*)    All other components within normal limits  LIPASE, BLOOD - Abnormal; Notable for the following components:   Lipase 352 (*)    All other components  within normal limits  URINALYSIS, ROUTINE W REFLEX MICROSCOPIC - Abnormal; Notable for the following components:   Specific Gravity, Urine >1.030 (*)    Leukocytes,Ua SMALL (*)    All other components within normal limits  URINALYSIS, MICROSCOPIC (REFLEX) - Abnormal; Notable for the following components:   Bacteria, UA RARE (*)    All other components within normal limits  LACTIC ACID, PLASMA  LACTIC ACID, PLASMA  CBC WITH DIFFERENTIAL/PLATELET    EKG None  Radiology CT Angio Abd/Pel W and/or  Wo Contrast  Result Date: 07/25/2022 CLINICAL DATA:  Increasing abdominal pain with hematochezia EXAM: CT ABDOMEN AND PELVIS WITH CONTRAST TECHNIQUE: Multidetector CT of the abdomen and pelvis during bolus administration of intravenous contrast. RADIATION DOSE REDUCTION: This exam was performed according to the departmental dose-optimization program which includes automated exposure control, adjustment of the mA and/or kV according to patient size and/or use of iterative reconstruction technique. CONTRAST:  17mL OMNIPAQUE IOHEXOL 350 MG/ML SOLN COMPARISON:  CT abdomen and pelvis 07/20/2022 FINDINGS: Lower chest: Coronary artery calcification. Moderate hiatal hernia. No acute abnormality. Hepatobiliary: Cholecystectomy. No hepatic mass. No biliary dilation. Pancreas: No mass, inflammatory changes, or other significant abnormality. Spleen: Within normal limits in size and appearance. Adrenals/Urinary Tract: Unremarkable adrenal glands. Cortical renal scarring. Low-attenuation lesions in the kidneys are statistically likely to represent cysts. No follow-up is required. No urinary calculi or hydronephrosis. Nondistended bladder. Stomach/Bowel: Colonic diverticulosis without diverticulitis. No evidence of colitis. The appendix is not visualized. Normal caliber large and small bowel. Unremarkable stomach. Vascular/Lymphatic: Aortic calcified atherosclerosis. No suspicious lymphadenopathy. Reproductive:  Hysterectomy. Other: No free intraperitoneal fluid or air. Musculoskeletal: Thoracolumbar spondylosis. Chronic vertebral body height loss of T12. Retropulsion of the superior endplate of T12. IMPRESSION: No acute abnormality. Colonic diverticulosis without diverticulitis. Moderate hiatal hernia. Unchanged T12 compression fracture with 6 mm of retropulsion of the superior endplate. Aortic Atherosclerosis (ICD10-I70.0). Electronically Signed   By: Minerva Fester M.D.   On: 07/25/2022 03:26    Procedures Procedures  {Document cardiac monitor, telemetry assessment procedure when appropriate:1}  Medications Ordered in ED Medications  sodium chloride 0.9 % bolus 1,000 mL (has no administration in time range)  iohexol (OMNIPAQUE) 350 MG/ML injection 75 mL (75 mLs Intravenous Contrast Given 07/25/22 0304)    ED Course/ Medical Decision Making/ A&P                           Medical Decision Making  ***  {Document critical care time when appropriate:1} {Document review of labs and clinical decision tools ie heart score, Chads2Vasc2 etc:1}  {Document your independent review of radiology images, and any outside records:1} {Document your discussion with family members, caretakers, and with consultants:1} {Document social determinants of health affecting pt's care:1} {Document your decision making why or why not admission, treatments were needed:1} Final Clinical Impression(s) / ED Diagnoses Final diagnoses:  None    Rx / DC Orders ED Discharge Orders     None

## 2022-07-25 NOTE — ED Notes (Signed)
RN unable to obtain IV access, another RN to attempt

## 2022-07-26 LAB — CBC WITH DIFFERENTIAL/PLATELET
Abs Immature Granulocytes: 0.25 10*3/uL — ABNORMAL HIGH (ref 0.00–0.07)
Basophils Absolute: 0.1 10*3/uL (ref 0.0–0.1)
Basophils Relative: 1 %
Eosinophils Absolute: 0.3 10*3/uL (ref 0.0–0.5)
Eosinophils Relative: 2 %
HCT: 35.4 % — ABNORMAL LOW (ref 36.0–46.0)
Hemoglobin: 12 g/dL (ref 12.0–15.0)
Immature Granulocytes: 2 %
Lymphocytes Relative: 18 %
Lymphs Abs: 2.3 10*3/uL (ref 0.7–4.0)
MCH: 30.8 pg (ref 26.0–34.0)
MCHC: 33.9 g/dL (ref 30.0–36.0)
MCV: 90.8 fL (ref 80.0–100.0)
Monocytes Absolute: 1 10*3/uL (ref 0.1–1.0)
Monocytes Relative: 8 %
Neutro Abs: 9.1 10*3/uL — ABNORMAL HIGH (ref 1.7–7.7)
Neutrophils Relative %: 69 %
Platelets: 276 10*3/uL (ref 150–400)
RBC: 3.9 MIL/uL (ref 3.87–5.11)
RDW: 13.2 % (ref 11.5–15.5)
WBC: 13 10*3/uL — ABNORMAL HIGH (ref 4.0–10.5)
nRBC: 0 % (ref 0.0–0.2)

## 2022-07-26 NOTE — ED Notes (Signed)
Pt ambulated to the bathroom independently.

## 2022-07-26 NOTE — Discharge Instructions (Signed)
Your blood count on repeat check was still within normal limits. Continue your oral antibiotic for the C. Diff. Make sure to drink lots of water to stay hydrated. Follow-up with your primary care doctor. Return here for new concerns.

## 2022-08-15 ENCOUNTER — Inpatient Hospital Stay: Admission: RE | Admit: 2022-08-15 | Payer: Medicare Other | Source: Ambulatory Visit

## 2023-02-02 ENCOUNTER — Other Ambulatory Visit: Payer: Self-pay | Admitting: Family Medicine

## 2023-02-02 DIAGNOSIS — Z1231 Encounter for screening mammogram for malignant neoplasm of breast: Secondary | ICD-10-CM

## 2023-04-13 ENCOUNTER — Ambulatory Visit: Admission: RE | Admit: 2023-04-13 | Payer: Medicare Other | Source: Ambulatory Visit

## 2023-04-13 DIAGNOSIS — Z1231 Encounter for screening mammogram for malignant neoplasm of breast: Secondary | ICD-10-CM

## 2023-05-22 ENCOUNTER — Other Ambulatory Visit: Payer: Self-pay | Admitting: Family Medicine

## 2023-05-22 DIAGNOSIS — M810 Age-related osteoporosis without current pathological fracture: Secondary | ICD-10-CM

## 2023-08-31 ENCOUNTER — Ambulatory Visit: Payer: Medicare Other | Admitting: Family Medicine

## 2023-10-09 ENCOUNTER — Other Ambulatory Visit: Payer: Self-pay | Admitting: Family Medicine

## 2023-10-09 DIAGNOSIS — R9389 Abnormal findings on diagnostic imaging of other specified body structures: Secondary | ICD-10-CM

## 2023-10-13 ENCOUNTER — Ambulatory Visit
Admission: RE | Admit: 2023-10-13 | Discharge: 2023-10-13 | Disposition: A | Discharge status: Home / Self Care | Payer: Medicare Other | Source: Ambulatory Visit | Attending: Family Medicine | Admitting: Family Medicine

## 2023-10-13 DIAGNOSIS — R9389 Abnormal findings on diagnostic imaging of other specified body structures: Secondary | ICD-10-CM

## 2023-11-25 ENCOUNTER — Ambulatory Visit
Admission: RE | Admit: 2023-11-25 | Discharge: 2023-11-25 | Disposition: A | Discharge status: Home / Self Care | Payer: Medicare Other | Source: Ambulatory Visit | Attending: Family Medicine | Admitting: Family Medicine

## 2023-11-25 DIAGNOSIS — M810 Age-related osteoporosis without current pathological fracture: Secondary | ICD-10-CM

## 2024-03-07 ENCOUNTER — Other Ambulatory Visit: Payer: Self-pay | Admitting: Family Medicine

## 2024-03-07 DIAGNOSIS — Z1231 Encounter for screening mammogram for malignant neoplasm of breast: Secondary | ICD-10-CM

## 2024-04-13 ENCOUNTER — Ambulatory Visit
Admission: RE | Admit: 2024-04-13 | Discharge: 2024-04-13 | Disposition: A | Discharge status: Home / Self Care | Source: Ambulatory Visit | Attending: Family Medicine | Admitting: Family Medicine

## 2024-04-13 DIAGNOSIS — Z1231 Encounter for screening mammogram for malignant neoplasm of breast: Secondary | ICD-10-CM

## 2024-04-19 ENCOUNTER — Other Ambulatory Visit: Payer: Self-pay | Admitting: Family Medicine

## 2024-04-19 DIAGNOSIS — R928 Other abnormal and inconclusive findings on diagnostic imaging of breast: Secondary | ICD-10-CM

## 2024-04-26 ENCOUNTER — Encounter

## 2024-04-26 ENCOUNTER — Other Ambulatory Visit

## 2024-05-10 ENCOUNTER — Ambulatory Visit
Admission: RE | Admit: 2024-05-10 | Discharge: 2024-05-10 | Disposition: A | Discharge status: Home / Self Care | Source: Ambulatory Visit | Attending: Family Medicine | Admitting: Family Medicine

## 2024-05-10 DIAGNOSIS — R928 Other abnormal and inconclusive findings on diagnostic imaging of breast: Secondary | ICD-10-CM

## 2024-05-31 ENCOUNTER — Other Ambulatory Visit: Payer: Self-pay

## 2024-05-31 ENCOUNTER — Inpatient Hospital Stay (HOSPITAL_COMMUNITY)

## 2024-05-31 ENCOUNTER — Emergency Department (HOSPITAL_COMMUNITY)

## 2024-05-31 ENCOUNTER — Inpatient Hospital Stay (HOSPITAL_COMMUNITY)
Admission: EM | Admit: 2024-05-31 | Discharge: 2024-06-04 | DRG: 516 | Disposition: A | Discharge status: Skilled Nursing Facility | Attending: Internal Medicine | Admitting: Internal Medicine

## 2024-05-31 ENCOUNTER — Other Ambulatory Visit (HOSPITAL_COMMUNITY): Payer: Self-pay

## 2024-05-31 ENCOUNTER — Encounter (HOSPITAL_COMMUNITY)
Admission: EM | Disposition: A | Discharge status: Skilled Nursing Facility | Payer: Self-pay | Source: Home / Self Care | Attending: Internal Medicine

## 2024-05-31 ENCOUNTER — Encounter (HOSPITAL_COMMUNITY): Payer: Self-pay

## 2024-05-31 DIAGNOSIS — K219 Gastro-esophageal reflux disease without esophagitis: Secondary | ICD-10-CM | POA: Diagnosis present

## 2024-05-31 DIAGNOSIS — N1831 Chronic kidney disease, stage 3a: Secondary | ICD-10-CM | POA: Diagnosis present

## 2024-05-31 DIAGNOSIS — J4489 Other specified chronic obstructive pulmonary disease: Secondary | ICD-10-CM | POA: Diagnosis present

## 2024-05-31 DIAGNOSIS — W010XXA Fall on same level from slipping, tripping and stumbling without subsequent striking against object, initial encounter: Secondary | ICD-10-CM | POA: Diagnosis present

## 2024-05-31 DIAGNOSIS — J449 Chronic obstructive pulmonary disease, unspecified: Secondary | ICD-10-CM | POA: Diagnosis not present

## 2024-05-31 DIAGNOSIS — S51812A Laceration without foreign body of left forearm, initial encounter: Secondary | ICD-10-CM | POA: Diagnosis present

## 2024-05-31 DIAGNOSIS — M800B2A Age-related osteoporosis with current pathological fracture, left pelvis, initial encounter for fracture: Secondary | ICD-10-CM | POA: Diagnosis not present

## 2024-05-31 DIAGNOSIS — Y92008 Other place in unspecified non-institutional (private) residence as the place of occurrence of the external cause: Secondary | ICD-10-CM | POA: Diagnosis not present

## 2024-05-31 DIAGNOSIS — E66811 Obesity, class 1: Secondary | ICD-10-CM | POA: Diagnosis present

## 2024-05-31 DIAGNOSIS — Z7982 Long term (current) use of aspirin: Secondary | ICD-10-CM

## 2024-05-31 DIAGNOSIS — Z87891 Personal history of nicotine dependence: Secondary | ICD-10-CM

## 2024-05-31 DIAGNOSIS — E785 Hyperlipidemia, unspecified: Secondary | ICD-10-CM | POA: Diagnosis present

## 2024-05-31 DIAGNOSIS — I10 Essential (primary) hypertension: Secondary | ICD-10-CM | POA: Diagnosis not present

## 2024-05-31 DIAGNOSIS — R7303 Prediabetes: Secondary | ICD-10-CM | POA: Diagnosis present

## 2024-05-31 DIAGNOSIS — Z91048 Other nonmedicinal substance allergy status: Secondary | ICD-10-CM

## 2024-05-31 DIAGNOSIS — N183 Chronic kidney disease, stage 3 unspecified: Secondary | ICD-10-CM | POA: Diagnosis present

## 2024-05-31 DIAGNOSIS — F05 Delirium due to known physiological condition: Secondary | ICD-10-CM | POA: Diagnosis not present

## 2024-05-31 DIAGNOSIS — E782 Mixed hyperlipidemia: Secondary | ICD-10-CM

## 2024-05-31 DIAGNOSIS — Z803 Family history of malignant neoplasm of breast: Secondary | ICD-10-CM

## 2024-05-31 DIAGNOSIS — Z7951 Long term (current) use of inhaled steroids: Secondary | ICD-10-CM

## 2024-05-31 DIAGNOSIS — H9190 Unspecified hearing loss, unspecified ear: Secondary | ICD-10-CM | POA: Diagnosis present

## 2024-05-31 DIAGNOSIS — M25512 Pain in left shoulder: Secondary | ICD-10-CM | POA: Diagnosis present

## 2024-05-31 DIAGNOSIS — S32402A Unspecified fracture of left acetabulum, initial encounter for closed fracture: Principal | ICD-10-CM | POA: Diagnosis present

## 2024-05-31 DIAGNOSIS — W19XXXA Unspecified fall, initial encounter: Secondary | ICD-10-CM

## 2024-05-31 DIAGNOSIS — Z880 Allergy status to penicillin: Secondary | ICD-10-CM | POA: Diagnosis not present

## 2024-05-31 DIAGNOSIS — Z86718 Personal history of other venous thrombosis and embolism: Secondary | ICD-10-CM

## 2024-05-31 DIAGNOSIS — Z23 Encounter for immunization: Secondary | ICD-10-CM | POA: Diagnosis present

## 2024-05-31 DIAGNOSIS — Z888 Allergy status to other drugs, medicaments and biological substances status: Secondary | ICD-10-CM | POA: Diagnosis not present

## 2024-05-31 DIAGNOSIS — D72829 Elevated white blood cell count, unspecified: Secondary | ICD-10-CM | POA: Diagnosis present

## 2024-05-31 DIAGNOSIS — I129 Hypertensive chronic kidney disease with stage 1 through stage 4 chronic kidney disease, or unspecified chronic kidney disease: Secondary | ICD-10-CM | POA: Diagnosis present

## 2024-05-31 DIAGNOSIS — R102 Pelvic and perineal pain: Secondary | ICD-10-CM | POA: Diagnosis present

## 2024-05-31 DIAGNOSIS — Y92009 Unspecified place in unspecified non-institutional (private) residence as the place of occurrence of the external cause: Secondary | ICD-10-CM | POA: Diagnosis not present

## 2024-05-31 DIAGNOSIS — E440 Moderate protein-calorie malnutrition: Secondary | ICD-10-CM | POA: Diagnosis present

## 2024-05-31 DIAGNOSIS — Z9049 Acquired absence of other specified parts of digestive tract: Secondary | ICD-10-CM

## 2024-05-31 DIAGNOSIS — Z96653 Presence of artificial knee joint, bilateral: Secondary | ICD-10-CM | POA: Diagnosis present

## 2024-05-31 DIAGNOSIS — G8929 Other chronic pain: Secondary | ICD-10-CM | POA: Diagnosis present

## 2024-05-31 DIAGNOSIS — Z79899 Other long term (current) drug therapy: Secondary | ICD-10-CM

## 2024-05-31 DIAGNOSIS — Z6831 Body mass index (BMI) 31.0-31.9, adult: Secondary | ICD-10-CM

## 2024-05-31 DIAGNOSIS — Z881 Allergy status to other antibiotic agents status: Secondary | ICD-10-CM

## 2024-05-31 DIAGNOSIS — S32592A Other specified fracture of left pubis, initial encounter for closed fracture: Secondary | ICD-10-CM | POA: Diagnosis present

## 2024-05-31 DIAGNOSIS — S61412A Laceration without foreign body of left hand, initial encounter: Secondary | ICD-10-CM | POA: Diagnosis present

## 2024-05-31 DIAGNOSIS — Z90710 Acquired absence of both cervix and uterus: Secondary | ICD-10-CM

## 2024-05-31 HISTORY — PX: ORIF ACETABULAR FRACTURE: SHX5029

## 2024-05-31 LAB — CBC WITH DIFFERENTIAL/PLATELET
Abs Immature Granulocytes: 0.08 K/uL — ABNORMAL HIGH (ref 0.00–0.07)
Basophils Absolute: 0.1 K/uL (ref 0.0–0.1)
Basophils Relative: 1 %
Eosinophils Absolute: 0.3 K/uL (ref 0.0–0.5)
Eosinophils Relative: 2 %
HCT: 39.9 % (ref 36.0–46.0)
Hemoglobin: 13.2 g/dL (ref 12.0–15.0)
Immature Granulocytes: 1 %
Lymphocytes Relative: 10 %
Lymphs Abs: 1.2 K/uL (ref 0.7–4.0)
MCH: 30.1 pg (ref 26.0–34.0)
MCHC: 33.1 g/dL (ref 30.0–36.0)
MCV: 91.1 fL (ref 80.0–100.0)
Monocytes Absolute: 0.8 K/uL (ref 0.1–1.0)
Monocytes Relative: 6 %
Neutro Abs: 9.9 K/uL — ABNORMAL HIGH (ref 1.7–7.7)
Neutrophils Relative %: 80 %
Platelets: 249 K/uL (ref 150–400)
RBC: 4.38 MIL/uL (ref 3.87–5.11)
RDW: 12.6 % (ref 11.5–15.5)
WBC: 12.2 K/uL — ABNORMAL HIGH (ref 4.0–10.5)
nRBC: 0 % (ref 0.0–0.2)

## 2024-05-31 LAB — BASIC METABOLIC PANEL WITH GFR
Anion gap: 9 (ref 5–15)
BUN: 12 mg/dL (ref 8–23)
CO2: 23 mmol/L (ref 22–32)
Calcium: 9.2 mg/dL (ref 8.9–10.3)
Chloride: 106 mmol/L (ref 98–111)
Creatinine, Ser: 1.14 mg/dL — ABNORMAL HIGH (ref 0.44–1.00)
GFR, Estimated: 47 mL/min — ABNORMAL LOW (ref >60)
Glucose, Bld: 114 mg/dL — ABNORMAL HIGH (ref 70–99)
Potassium: 4.1 mmol/L (ref 3.5–5.1)
Sodium: 138 mmol/L (ref 135–145)

## 2024-05-31 LAB — TYPE AND SCREEN
ABO/RH(D): A POS
Antibody Screen: NEGATIVE

## 2024-05-31 LAB — SURGICAL PCR SCREEN
MRSA, PCR: NEGATIVE
Staphylococcus aureus: POSITIVE — AB

## 2024-05-31 LAB — GLUCOSE, CAPILLARY: Glucose-Capillary: 130 mg/dL — ABNORMAL HIGH (ref 70–99)

## 2024-05-31 SURGERY — OPEN REDUCTION INTERNAL FIXATION (ORIF) ACETABULAR FRACTURE
Anesthesia: General | Site: Shoulder | Laterality: Left

## 2024-05-31 MED ORDER — ACETAMINOPHEN 500 MG PO TABS
1000.0000 mg | ORAL_TABLET | Freq: Three times a day (TID) | ORAL | Status: DC
  Administered 2024-05-31 – 2024-06-04 (×10): 1000 mg via ORAL
  Filled 2024-05-31 (×11): qty 2

## 2024-05-31 MED ORDER — LIDOCAINE-EPINEPHRINE-TETRACAINE (LET) TOPICAL GEL
3.0000 mL | Freq: Once | TOPICAL | Status: AC
  Administered 2024-05-31: 3 mL via TOPICAL
  Filled 2024-05-31: qty 3

## 2024-05-31 MED ORDER — DEXMEDETOMIDINE HCL IN NACL 80 MCG/20ML IV SOLN
INTRAVENOUS | Status: DC | PRN
  Administered 2024-05-31: 8 ug via INTRAVENOUS
  Administered 2024-05-31: 6 ug via INTRAVENOUS

## 2024-05-31 MED ORDER — MUPIROCIN 2 % EX OINT
1.0000 | TOPICAL_OINTMENT | Freq: Two times a day (BID) | CUTANEOUS | 0 refills | Status: DC
  Filled 2024-05-31: qty 22, 11d supply, fill #0
  Filled 2024-05-31: qty 44, 22d supply, fill #1

## 2024-05-31 MED ORDER — 0.9 % SODIUM CHLORIDE (POUR BTL) OPTIME
TOPICAL | Status: DC | PRN
  Administered 2024-05-31: 1000 mL

## 2024-05-31 MED ORDER — OXYCODONE HCL 5 MG/5ML PO SOLN: 5.0000 mg | Freq: Once | ORAL | Status: DC | PRN

## 2024-05-31 MED ORDER — DIPHENHYDRAMINE HCL 25 MG PO TABS: 25.0000 mg | ORAL_TABLET | Freq: Four times a day (QID) | ORAL | Status: DC | PRN

## 2024-05-31 MED ORDER — SENNOSIDES-DOCUSATE SODIUM 8.6-50 MG PO TABS
1.0000 | ORAL_TABLET | Freq: Two times a day (BID) | ORAL | Status: DC
  Administered 2024-05-31 – 2024-06-04 (×7): 1 via ORAL
  Filled 2024-05-31 (×8): qty 1

## 2024-05-31 MED ORDER — POTASSIUM CHLORIDE IN NACL 20-0.9 MEQ/L-% IV SOLN
INTRAVENOUS | Status: DC
  Filled 2024-05-31: qty 1000

## 2024-05-31 MED ORDER — DILTIAZEM HCL ER COATED BEADS 120 MG PO CP24
120.0000 mg | ORAL_CAPSULE | Freq: Every day | ORAL | Status: DC
Start: 2024-05-31 — End: 2024-06-04
  Administered 2024-05-31 – 2024-06-04 (×5): 120 mg via ORAL
  Filled 2024-05-31 (×5): qty 1

## 2024-05-31 MED ORDER — METOPROLOL SUCCINATE ER 100 MG PO TB24
100.0000 mg | ORAL_TABLET | Freq: Every day | ORAL | Status: DC
  Administered 2024-05-31 – 2024-06-04 (×5): 100 mg via ORAL
  Filled 2024-05-31 (×5): qty 1

## 2024-05-31 MED ORDER — HYDROMORPHONE HCL 1 MG/ML IJ SOLN: 0.5000 mg | INTRAMUSCULAR | Status: DC | PRN

## 2024-05-31 MED ORDER — HYDROCODONE-ACETAMINOPHEN 5-325 MG PO TABS: 1.0000 | ORAL_TABLET | Freq: Four times a day (QID) | ORAL | Status: DC | PRN

## 2024-05-31 MED ORDER — PHENYLEPHRINE HCL-NACL 20-0.9 MG/250ML-% IV SOLN
INTRAVENOUS | Status: DC | PRN
  Administered 2024-05-31: 25 ug/min via INTRAVENOUS

## 2024-05-31 MED ORDER — HYDROMORPHONE HCL 1 MG/ML IJ SOLN
1.0000 mg | Freq: Once | INTRAMUSCULAR | Status: AC
  Administered 2024-05-31: 1 mg via INTRAVENOUS
  Filled 2024-05-31: qty 1

## 2024-05-31 MED ORDER — ROCURONIUM BROMIDE 10 MG/ML (PF) SYRINGE
PREFILLED_SYRINGE | INTRAVENOUS | Status: AC
  Filled 2024-05-31: qty 10

## 2024-05-31 MED ORDER — LACTATED RINGERS IV SOLN: INTRAVENOUS | Status: DC

## 2024-05-31 MED ORDER — HYDROMORPHONE HCL 1 MG/ML IJ SOLN
INTRAMUSCULAR | Status: DC | PRN
  Administered 2024-05-31: .5 mg via INTRAVENOUS

## 2024-05-31 MED ORDER — HYDROMORPHONE HCL 1 MG/ML IJ SOLN
0.5000 mg | Freq: Once | INTRAMUSCULAR | Status: AC
  Administered 2024-05-31: 0.5 mg via INTRAVENOUS
  Filled 2024-05-31: qty 1

## 2024-05-31 MED ORDER — CHLORHEXIDINE GLUCONATE 0.12 % MT SOLN
15.0000 mL | Freq: Once | OROMUCOSAL | Status: AC
  Administered 2024-05-31: 15 mL via OROMUCOSAL
  Filled 2024-05-31: qty 15

## 2024-05-31 MED ORDER — THROMBIN 20000 UNITS EX KIT
PACK | CUTANEOUS | Status: DC | PRN
  Administered 2024-05-31: 20000 [IU] via TOPICAL

## 2024-05-31 MED ORDER — HYDROCHLOROTHIAZIDE 25 MG PO TABS
25.0000 mg | ORAL_TABLET | Freq: Every day | ORAL | Status: DC
  Administered 2024-05-31 – 2024-06-03 (×4): 25 mg via ORAL
  Filled 2024-05-31 (×5): qty 1

## 2024-05-31 MED ORDER — ONDANSETRON HCL 4 MG/2ML IJ SOLN
INTRAMUSCULAR | Status: DC | PRN
  Administered 2024-05-31: 4 mg via INTRAVENOUS

## 2024-05-31 MED ORDER — METOCLOPRAMIDE HCL 5 MG/ML IJ SOLN: 5.0000 mg | Freq: Three times a day (TID) | INTRAMUSCULAR | Status: DC | PRN

## 2024-05-31 MED ORDER — LIDOCAINE 5 % EX PTCH
1.0000 | MEDICATED_PATCH | CUTANEOUS | Status: DC
  Administered 2024-05-31 – 2024-06-04 (×5): 1 via TRANSDERMAL
  Filled 2024-05-31 (×5): qty 1

## 2024-05-31 MED ORDER — FENTANYL CITRATE (PF) 250 MCG/5ML IJ SOLN
INTRAMUSCULAR | Status: AC
  Filled 2024-05-31: qty 5

## 2024-05-31 MED ORDER — METOCLOPRAMIDE HCL 5 MG PO TABS: 5.0000 mg | ORAL_TABLET | Freq: Three times a day (TID) | ORAL | Status: DC | PRN

## 2024-05-31 MED ORDER — ONDANSETRON HCL 4 MG/2ML IJ SOLN: 4.0000 mg | Freq: Four times a day (QID) | INTRAMUSCULAR | Status: DC | PRN

## 2024-05-31 MED ORDER — DEXAMETHASONE SODIUM PHOSPHATE 10 MG/ML IJ SOLN
INTRAMUSCULAR | Status: DC | PRN
  Administered 2024-05-31: 4 mg via INTRAVENOUS

## 2024-05-31 MED ORDER — VANCOMYCIN HCL IN DEXTROSE 1-5 GM/200ML-% IV SOLN
1000.0000 mg | Freq: Two times a day (BID) | INTRAVENOUS | Status: AC
  Administered 2024-06-01: 1000 mg via INTRAVENOUS
  Filled 2024-05-31: qty 200

## 2024-05-31 MED ORDER — ROCURONIUM BROMIDE 10 MG/ML (PF) SYRINGE
PREFILLED_SYRINGE | INTRAVENOUS | Status: DC | PRN
  Administered 2024-05-31: 10 mg via INTRAVENOUS
  Administered 2024-05-31: 15 mg via INTRAVENOUS
  Administered 2024-05-31: 40 mg via INTRAVENOUS
  Administered 2024-05-31: 5 mg via INTRAVENOUS
  Administered 2024-05-31: 15 mg via INTRAVENOUS

## 2024-05-31 MED ORDER — ORAL CARE MOUTH RINSE: 15.0000 mL | Freq: Once | OROMUCOSAL | Status: AC

## 2024-05-31 MED ORDER — DEXAMETHASONE SODIUM PHOSPHATE 10 MG/ML IJ SOLN
INTRAMUSCULAR | Status: AC
  Filled 2024-05-31: qty 1

## 2024-05-31 MED ORDER — ASPIRIN 325 MG PO TABS
325.0000 mg | ORAL_TABLET | Freq: Every day | ORAL | Status: DC
  Administered 2024-05-31 – 2024-06-04 (×5): 325 mg via ORAL
  Filled 2024-05-31 (×5): qty 1

## 2024-05-31 MED ORDER — ACETAMINOPHEN 500 MG PO TABS
1000.0000 mg | ORAL_TABLET | ORAL | Status: AC
  Administered 2024-05-31: 1000 mg via ORAL
  Filled 2024-05-31: qty 2

## 2024-05-31 MED ORDER — AMISULPRIDE (ANTIEMETIC) 5 MG/2ML IV SOLN: 10.0000 mg | Freq: Once | INTRAVENOUS | Status: DC | PRN

## 2024-05-31 MED ORDER — IPRATROPIUM-ALBUTEROL 0.5-2.5 (3) MG/3ML IN SOLN: 3.0000 mL | RESPIRATORY_TRACT | Status: DC | PRN

## 2024-05-31 MED ORDER — ONDANSETRON HCL 4 MG/2ML IJ SOLN
4.0000 mg | Freq: Once | INTRAMUSCULAR | Status: AC
  Administered 2024-05-31: 4 mg via INTRAVENOUS
  Filled 2024-05-31: qty 2

## 2024-05-31 MED ORDER — ONDANSETRON HCL 4 MG/2ML IJ SOLN
INTRAMUSCULAR | Status: AC
  Filled 2024-05-31: qty 2

## 2024-05-31 MED ORDER — FENTANYL CITRATE (PF) 250 MCG/5ML IJ SOLN
INTRAMUSCULAR | Status: DC | PRN
  Administered 2024-05-31: 50 ug via INTRAVENOUS
  Administered 2024-05-31: 100 ug via INTRAVENOUS
  Administered 2024-05-31 (×2): 50 ug via INTRAVENOUS

## 2024-05-31 MED ORDER — ENOXAPARIN SODIUM 40 MG/0.4ML IJ SOSY: 40.0000 mg | PREFILLED_SYRINGE | INTRAMUSCULAR | Status: DC

## 2024-05-31 MED ORDER — LIDOCAINE 2% (20 MG/ML) 5 ML SYRINGE
INTRAMUSCULAR | Status: DC | PRN
  Administered 2024-05-31: 60 mg via INTRAVENOUS

## 2024-05-31 MED ORDER — PROPOFOL 10 MG/ML IV BOLUS
INTRAVENOUS | Status: AC
  Filled 2024-05-31: qty 20

## 2024-05-31 MED ORDER — ALBUMIN HUMAN 5 % IV SOLN: INTRAVENOUS | Status: DC | PRN

## 2024-05-31 MED ORDER — FLUOXETINE HCL 20 MG PO CAPS
40.0000 mg | ORAL_CAPSULE | Freq: Every day | ORAL | Status: DC
  Administered 2024-06-01 – 2024-06-04 (×4): 40 mg via ORAL
  Filled 2024-05-31 (×4): qty 2

## 2024-05-31 MED ORDER — TETANUS-DIPHTH-ACELL PERTUSSIS 5-2.5-18.5 LF-MCG/0.5 IM SUSY
0.5000 mL | PREFILLED_SYRINGE | Freq: Once | INTRAMUSCULAR | Status: AC
  Administered 2024-05-31: 0.5 mL via INTRAMUSCULAR
  Filled 2024-05-31: qty 0.5

## 2024-05-31 MED ORDER — ROSUVASTATIN CALCIUM 5 MG PO TABS
5.0000 mg | ORAL_TABLET | Freq: Every day | ORAL | Status: DC
Start: 2024-05-31 — End: 2024-06-04
  Administered 2024-05-31 – 2024-06-04 (×5): 5 mg via ORAL
  Filled 2024-05-31 (×5): qty 1

## 2024-05-31 MED ORDER — PANTOPRAZOLE SODIUM 40 MG PO TBEC
40.0000 mg | DELAYED_RELEASE_TABLET | Freq: Every day | ORAL | Status: DC
  Administered 2024-05-31 – 2024-06-04 (×5): 40 mg via ORAL
  Filled 2024-05-31 (×5): qty 1

## 2024-05-31 MED ORDER — ONDANSETRON HCL 4 MG PO TABS: 4.0000 mg | ORAL_TABLET | Freq: Four times a day (QID) | ORAL | Status: DC | PRN

## 2024-05-31 MED ORDER — LIDOCAINE 2% (20 MG/ML) 5 ML SYRINGE
INTRAMUSCULAR | Status: AC
Start: 2024-05-31 — End: 2024-05-31
  Filled 2024-05-31: qty 5

## 2024-05-31 MED ORDER — SUGAMMADEX SODIUM 200 MG/2ML IV SOLN
INTRAVENOUS | Status: DC | PRN
  Administered 2024-05-31: 200 mg via INTRAVENOUS

## 2024-05-31 MED ORDER — VANCOMYCIN HCL 1000 MG IV SOLR
INTRAVENOUS | Status: DC | PRN
  Administered 2024-05-31: 1000 mg via INTRAVENOUS

## 2024-05-31 MED ORDER — DIPHENOXYLATE-ATROPINE 2.5-0.025 MG PO TABS: 1.0000 | ORAL_TABLET | Freq: Four times a day (QID) | ORAL | Status: DC | PRN

## 2024-05-31 MED ORDER — CHLORHEXIDINE GLUCONATE 4 % EX SOLN
1.0000 | CUTANEOUS | 1 refills | Status: AC
  Filled 2024-05-31: qty 946, 28d supply, fill #0

## 2024-05-31 MED ORDER — PROPOFOL 10 MG/ML IV BOLUS
INTRAVENOUS | Status: DC | PRN
  Administered 2024-05-31: 50 mg via INTRAVENOUS

## 2024-05-31 MED ORDER — ENOXAPARIN SODIUM 40 MG/0.4ML IJ SOSY
40.0000 mg | PREFILLED_SYRINGE | INTRAMUSCULAR | Status: DC
  Administered 2024-06-01 – 2024-06-03 (×3): 40 mg via SUBCUTANEOUS
  Filled 2024-05-31 (×3): qty 0.4

## 2024-05-31 MED ORDER — HYDROMORPHONE HCL 1 MG/ML IJ SOLN
INTRAMUSCULAR | Status: AC
  Filled 2024-05-31: qty 0.5

## 2024-05-31 MED ORDER — DOCUSATE SODIUM 100 MG PO CAPS
100.0000 mg | ORAL_CAPSULE | Freq: Two times a day (BID) | ORAL | Status: DC
  Administered 2024-05-31 – 2024-06-04 (×7): 100 mg via ORAL
  Filled 2024-05-31 (×8): qty 1

## 2024-05-31 MED ORDER — FENTANYL CITRATE (PF) 100 MCG/2ML IJ SOLN: 25.0000 ug | INTRAMUSCULAR | Status: DC | PRN

## 2024-05-31 MED ORDER — THROMBIN 20000 UNITS EX KIT
PACK | CUTANEOUS | Status: AC
  Filled 2024-05-31: qty 1

## 2024-05-31 MED ORDER — OXYCODONE HCL 5 MG PO TABS: 5.0000 mg | ORAL_TABLET | Freq: Once | ORAL | Status: DC | PRN

## 2024-05-31 MED ORDER — PHENYLEPHRINE 80 MCG/ML (10ML) SYRINGE FOR IV PUSH (FOR BLOOD PRESSURE SUPPORT)
PREFILLED_SYRINGE | INTRAVENOUS | Status: DC | PRN
  Administered 2024-05-31 (×2): 40 ug via INTRAVENOUS
  Administered 2024-05-31: 80 ug via INTRAVENOUS
  Administered 2024-05-31: 120 ug via INTRAVENOUS
  Administered 2024-05-31: 40 ug via INTRAVENOUS
  Administered 2024-05-31: 80 ug via INTRAVENOUS

## 2024-05-31 MED ORDER — OXYCODONE HCL 5 MG PO TABS
5.0000 mg | ORAL_TABLET | ORAL | Status: DC | PRN
  Administered 2024-06-01 (×2): 5 mg via ORAL
  Filled 2024-05-31 (×2): qty 1

## 2024-05-31 MED ORDER — VANCOMYCIN HCL IN DEXTROSE 1-5 GM/200ML-% IV SOLN
INTRAVENOUS | Status: AC
  Filled 2024-05-31: qty 200

## 2024-05-31 SURGICAL SUPPLY — 56 items
BAG COUNTER SPONGE SURGICOUNT (BAG) ×2 IMPLANT
BIT DRILL AO MATTA 2.5MX230M (BIT) IMPLANT
BIT DRILL STEP 3.5 (DRILL) IMPLANT
BLADE CLIPPER SURG (BLADE) IMPLANT
BRUSH SCRUB EZ PLAIN DRY (MISCELLANEOUS) ×4 IMPLANT
CLIP APPLIE 11 MED OPEN (CLIP) IMPLANT
COVER SURGICAL LIGHT HANDLE (MISCELLANEOUS) ×2 IMPLANT
DRAPE C-ARM 42X72 X-RAY (DRAPES) ×2 IMPLANT
DRAPE C-ARMOR (DRAPES) ×2 IMPLANT
DRAPE INCISE IOBAN 66X45 STRL (DRAPES) ×2 IMPLANT
DRAPE INCISE IOBAN 85X60 (DRAPES) ×2 IMPLANT
DRAPE SURG ORHT 6 SPLT 77X108 (DRAPES) ×4 IMPLANT
DRAPE U-SHAPE 47X51 STRL (DRAPES) ×2 IMPLANT
DRESSING PEEL AND PLC PRVNA 13 (GAUZE/BANDAGES/DRESSINGS) IMPLANT
DRSG MEPILEX POST OP 4X12 (GAUZE/BANDAGES/DRESSINGS) IMPLANT
DRSG MEPILEX POST OP 4X8 (GAUZE/BANDAGES/DRESSINGS) IMPLANT
ELECT BLADE 6.5 EXT (BLADE) ×2 IMPLANT
ELECTRODE REM PT RTRN 9FT ADLT (ELECTROSURGICAL) ×2 IMPLANT
GLOVE BIO SURGEON STRL SZ7.5 (GLOVE) ×2 IMPLANT
GLOVE BIO SURGEON STRL SZ8 (GLOVE) ×2 IMPLANT
GLOVE BIOGEL PI IND STRL 7.5 (GLOVE) ×2 IMPLANT
GLOVE BIOGEL PI IND STRL 8 (GLOVE) ×2 IMPLANT
GLOVE SURG ORTHO LTX SZ7.5 (GLOVE) ×4 IMPLANT
GOWN STRL REUS W/ TWL LRG LVL3 (GOWN DISPOSABLE) ×4 IMPLANT
GOWN STRL REUS W/ TWL XL LVL3 (GOWN DISPOSABLE) ×4 IMPLANT
KIT BASIN OR (CUSTOM PROCEDURE TRAY) ×2 IMPLANT
KIT TURNOVER KIT B (KITS) ×2 IMPLANT
MANIFOLD NEPTUNE II (INSTRUMENTS) ×2 IMPLANT
NS IRRIG 1000ML POUR BTL (IV SOLUTION) ×2 IMPLANT
PACK TOTAL JOINT (CUSTOM PROCEDURE TRAY) ×2 IMPLANT
PAD ARMBOARD POSITIONER FOAM (MISCELLANEOUS) ×4 IMPLANT
PLATE SUPRAPECTINEAL L (Plate) IMPLANT
RETRACTOR ONETRAX LX 135X30 (MISCELLANEOUS) IMPLANT
RETRIEVER SUT HEWSON (MISCELLANEOUS) ×2 IMPLANT
SCREW CORTEX ST MATTA 3.5X26MM (Screw) IMPLANT
SCREW CORTEX ST MATTA 3.5X28MM (Screw) IMPLANT
SCREW CORTEX ST MATTA 3.5X30MM (Screw) IMPLANT
SCREW CORTEX ST MATTA 3.5X32MM (Screw) IMPLANT
SCREW CORTEX ST MATTA 3.5X34MM (Screw) IMPLANT
SCREW CORTEX ST MATTA 3.5X60MM (Screw) IMPLANT
SCREW CORTICAL 3.5X42MM (Screw) IMPLANT
SET HNDPC FAN SPRY TIP SCT (DISPOSABLE) ×2 IMPLANT
SPONGE T-LAP 18X18 ~~LOC~~+RFID (SPONGE) IMPLANT
STAPLER SKIN PROX 35W (STAPLE) ×2 IMPLANT
STRIP CLOSURE SKIN 1/2X4 (GAUZE/BANDAGES/DRESSINGS) ×2 IMPLANT
SUCTION TUBE FRAZIER 10FR DISP (SUCTIONS) ×2 IMPLANT
SUT ETHILON 2 0 PSLX (SUTURE) ×4 IMPLANT
SUT VIC AB 0 CT1 27XBRD ANBCTR (SUTURE) ×2 IMPLANT
SUT VIC AB 1 CT1 18XCR BRD 8 (SUTURE) ×2 IMPLANT
SUT VIC AB 1 CT1 27XBRD ANBCTR (SUTURE) ×2 IMPLANT
SUT VIC AB 2-0 CT1 TAPERPNT 27 (SUTURE) ×2 IMPLANT
SUTURE FIBERWR #2 38 T-5 BLUE (SUTURE) ×4 IMPLANT
TOWEL GREEN STERILE (TOWEL DISPOSABLE) ×4 IMPLANT
TOWEL GREEN STERILE FF (TOWEL DISPOSABLE) ×4 IMPLANT
TRAY FOLEY MTR SLVR 16FR STAT (SET/KITS/TRAYS/PACK) IMPLANT
WATER STERILE IRR 1000ML POUR (IV SOLUTION) IMPLANT

## 2024-05-31 NOTE — ED Notes (Addendum)
 Applied LET topical gel to skin tears (2) on left forearm and hand. Pt requests that I stop due to burning. Pt's O2 level began decreasing as well, around 87-88%. Started [place] 2L. O2 increased to 93%.
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
## 2024-05-31 NOTE — ED Provider Notes (Signed)
 [place] EMERGENCY DEPARTMENT AT [facility] Provider Note   CSN: [id number] Arrival date & time: 05/31/24  9346     Patient presents with: [place]   Deanna Acosta is a 88 y.o. female.   88 year old female with history of COPD and hypertension who presents emergency department with left hip pain after a fall.  Patient reports that she was getting ready to let her dog out when she slipped and fell on her house.  Landed on her left hip.  No head strike or LOC.  No neck pain.  Was only on the ground for approximately 15 minutes.  Has not been able to bear weight since.       Prior to Admission medications   Medication Sig Start Date End Date Taking? Authorizing Provider  acetaminophen  (TYLENOL ) 500 MG tablet Take 1,000 mg by mouth every 6 (six) hours as needed for mild pain.    [provider]  ADVAIR [facility] 115-21 MCG/ACT inhaler Inhale 2 puffs into the lungs 2 (two) times daily. 12/02/21   [provider]  albuterol  (PROVENTIL  HFA;VENTOLIN  HFA) 108 (90 Base) MCG/ACT inhaler Inhale 2 puffs into the lungs every 6 (six) hours as needed for wheezing or shortness of breath.     [provider]  albuterol  (PROVENTIL ) (2.5 MG/3ML) 0.083% nebulizer solution Take 3 mLs (2.5 mg total) by nebulization every 4 (four) hours as needed for wheezing or shortness of breath. 12/24/21 12/24/22  Antoinette Doe, MD  aspirin  325 MG tablet Take 325 mg by mouth daily.    [provider]  cholecalciferol  (VITAMIN D3) 25 MCG (1000 UNIT) tablet Take 1,000 Units by mouth daily.    [provider]  diltiazem  (CARDIZEM  CD) 120 MG 24 hr capsule Take 120 mg by mouth daily.  08/01/14   [provider]  diphenhydrAMINE  (BENADRYL ) 25 MG tablet Take 1 tablet (25 mg total) by mouth every 6 (six) hours. Patient taking differently: Take 25 mg by mouth every 6 (six) hours as needed for allergies. 05/10/22   Theadore Ozell HERO, MD  diphenoxylate -atropine  (LOMOTIL )  2.5-0.025 MG tablet Take 1-2 tablets by mouth 4 (four) times daily as needed for diarrhea or loose stools. 07/19/22   Idol, Julie, PA-C  ferrous sulfate  325 (65 FE) MG tablet Take 325 mg by mouth daily with breakfast.    [provider]  FLUoxetine  (PROZAC ) 40 MG capsule Take 40 mg by mouth daily before breakfast.     [provider]  hydrochlorothiazide  (HYDRODIURIL ) 25 MG tablet Take 25 mg by mouth daily. 06/15/17   [provider]  metoprolol  succinate (TOPROL -XL) 100 MG 24 hr tablet Take 100 mg by mouth daily. 10/04/21   [provider]  omeprazole (PRILOSEC) 40 MG capsule Take 40 mg by mouth daily.    [provider]  potassium chloride  SA (KLOR-CON  M) 20 MEQ tablet Take 1 tablet (20 mEq total) by mouth 2 (two) times daily. 07/19/22   Idol, Julie, PA-C  rosuvastatin  (CRESTOR ) 5 MG tablet Take 5 mg by mouth daily. 11/12/21   [provider]    Allergies: Cortisone, Penicillins, Losartan , Other, Tape, and Levofloxacin     Review of Systems  Updated Vital Signs BP (!) 186/58 (BP Location: Left Arm)   Pulse 65   Temp (!) 97.3 F (36.3 C) (Oral)   Resp 18   Ht 5' 4 (1.626 m)   Wt 82 kg   SpO2 93%   BMI 31.03 kg/m   Physical Exam Constitutional:  General: She is not in acute distress.    Appearance: Normal appearance. She is not ill-appearing.  HENT:     Head: Normocephalic and atraumatic.     Right Ear: External ear normal.     Left Ear: External ear normal.     Mouth/Throat:     Mouth: Mucous membranes are moist.     Pharynx: Oropharynx is clear.  Eyes:     Extraocular Movements: Extraocular movements intact.     Conjunctiva/sclera: Conjunctivae normal.     Pupils: Pupils are equal, round, and reactive to light.  Neck:     Comments: No C-spine midline tenderness to palpation Cardiovascular:     Rate and Rhythm: Normal rate and regular rhythm.     Pulses: Normal pulses.     Heart sounds: Normal heart sounds.   Pulmonary:     Effort: Pulmonary effort is normal. No respiratory distress.     Breath sounds: Normal breath sounds.  Abdominal:     General: Abdomen is flat. There is no distension.     Palpations: Abdomen is soft. There is no mass.     Tenderness: There is no abdominal tenderness. There is no guarding.  Musculoskeletal:     Cervical back: No rigidity or tenderness.     Comments: No tenderness to palpation of midline thoracic or lumbar spine.  No step-offs palpated.  No tenderness to palpation of chest wall.  No bruising noted.  No tenderness to palpation of bilateral clavicles.  No tenderness to palpation, bruising, or deformities noted of bilateral shoulders, elbows, wrists, knees, or ankles.  Tenderness palpation of left hip.  Keeping left lower extremity in flexed position.  DP pulses 2+ bilaterally.  Both feet appear warm and well-perfused.  Able to wiggle all toes.  Skin:    Comments: Skin tears to left forearm and left thenar eminence  Neurological:     General: No focal deficit present.     Mental Status: She is alert and oriented to person, place, and time. Mental status is at baseline.     Cranial Nerves: No cranial nerve deficit.     Sensory: No sensory deficit.     Motor: No weakness.     (all labs ordered are listed, but only abnormal results are displayed) Labs Reviewed  BASIC METABOLIC PANEL WITH GFR - Abnormal; Notable for the following components:      Result Value   Glucose, Bld 114 (*)    Creatinine, Ser 1.14 (*)    GFR, Estimated 47 (*)    All other components within normal limits  CBC WITH DIFFERENTIAL/PLATELET - Abnormal; Notable for the following components:   WBC 12.2 (*)    Neutro Abs 9.9 (*)    Abs Immature Granulocytes 0.08 (*)    All other components within normal limits  TYPE AND SCREEN    EKG: None  Radiology: DG Pelvis 1-2 Views Result Date: 05/31/2024 CLINICAL DATA:  Left hip pain after fall while walking dog. EXAM: PELVIS - 1-2 VIEW  COMPARISON:  None Available. FINDINGS: Severely displaced and comminuted left acetabular fracture is noted. Nondisplaced left inferior pubic ramus fracture is noted. The proximal left femur appears unremarkable. IMPRESSION: Severely displaced and comminuted left acetabular fracture. Nondisplaced left inferior pubic ramus fracture. Electronically Signed   By: [name] Landy Raddle M.D.   On: 05/31/2024 09:00   DG FEMUR MIN 2 VIEWS LEFT Result Date: 05/31/2024 EXAM: 2 VIEWS XRAY OF THE LEFT FEMUR 05/31/2024 08:19:00 AM COMPARISON: None available. CLINICAL  HISTORY: Fall; Triage notes: Patient slipped and fell while walking to let her dog out. Patient complains of left hip pain. Patient is A\T\Ox4. No LOC. No thinners. FINDINGS: BONES AND JOINTS: Left total knee arthroplasty in place. Left acetabular fracture. Posterior displacement of the left femoral head with respect to the acetabulum. Fracture through the left inferior pubic rami noted. There may also be a fracture involving the lateral aspect of the superior pubic rami. SOFT TISSUES: The soft tissues are unremarkable. IMPRESSION: 1. Left acetabular fracture with posterior displacement of the left femoral head. 2. Fracture through the left inferior pubic rami and possible fracture involving the lateral aspect of the superior pubic rami. 3. Left total knee arthroplasty in place. Electronically signed by: Waddell Calk MD 05/31/2024 08:57 AM EDT RP Workstation: [id number]   DG Wrist Complete Left Result Date: 05/31/2024 CLINICAL DATA:  Fall. EXAM: LEFT WRIST - COMPLETE 3+ VIEW COMPARISON:  None Available. FINDINGS: There is no evidence of fracture or dislocation. There is no evidence of arthropathy or other focal bone abnormality. Soft tissues are unremarkable. IMPRESSION: Negative. Electronically Signed   By: [name] Landy Raddle M.D.   On: 05/31/2024 08:56     .Laceration Repair  Date/Time: 05/31/2024 9:40 AM  Performed by: Yolande Lamar BROCKS, MD Authorized by:  Yolande Lamar BROCKS, MD   Consent:    Consent obtained:  Verbal   Consent given by:  Patient Laceration details:    Length (cm):  5 Treatment:    Irrigation solution:  Sterile saline   Irrigation method:  Pressure wash   Debridement:  None Skin repair:    Repair method:  Steri-Strips Approximation:    Approximation:  Close Repair type:    Repair type:  Simple Post-procedure details:    Dressing:  Tube gauze   Procedure completion:  Tolerated well, no immediate complications Comments:     Skin tear on hand repaired via Steri-Strips.  Will need to stay dry for the next 24 to 48 hours. .Laceration Repair  Date/Time: 05/31/2024 9:41 AM  Performed by: Yolande Lamar BROCKS, MD Authorized by: Yolande Lamar BROCKS, MD   Consent:    Consent obtained:  Verbal   Consent given by:  Patient Laceration details:    Location:  Shoulder/arm   Shoulder/arm location:  L lower arm   Length (cm):  5 Treatment:    Irrigation solution:  Sterile saline   Irrigation method:  Pressure wash Skin repair:    Repair method:  Steri-Strips Approximation:    Approximation:  Close Repair type:    Repair type:  Simple Post-procedure details:    Dressing:  Tube gauze Comments:     Skin tear on forearm repaired via Steri-Strips.  Will need to stay dry for the next 24 to 48 hours.     Medications Ordered in the ED  acetaminophen  (TYLENOL ) tablet 1,000 mg (has no administration in time range)  lidocaine  (LIDODERM ) 5 % 1 patch (has no administration in time range)  HYDROmorphone  (DILAUDID ) injection 0.5 mg (0.5 mg Intravenous Given 05/31/24 0745)  ondansetron  (ZOFRAN ) injection 4 mg (4 mg Intravenous Given 05/31/24 0742)  Tdap (BOOSTRIX ) injection 0.5 mL (0.5 mLs Intramuscular Given 05/31/24 0740)  lidocaine -EPINEPHrine -tetracaine  (LET) topical gel (3 mLs Topical Given 05/31/24 0913)  HYDROmorphone  (DILAUDID ) injection 1 mg (1 mg Intravenous Given 05/31/24 0916)    Clinical Course as of 05/31/24 0959  Tue  May 31, 2024  0856 LET applied [RP]  9060 Deanna Ozell PARAS, PA-C consulted regarding a hip fracture.  They are working on getting the patient into the OR today.  Recommends hospitalist admission [RP]  0957 Dr Claudene from hospitalist consulted for admission. [RP]    Clinical Course User Index [RP] Yolande Lamar BROCKS, MD                                 Medical Decision Making Amount and/or Complexity of Data Reviewed Labs: ordered. Radiology: ordered.  Risk Prescription drug management. Decision regarding hospitalization.   Deanna Acosta is a 88 y.o. female with comorbidities that complicate the patient evaluation including bilateral knee replacement who presented to the emergency department with hip pain after a fall  Initial Ddx:  Hip fracture, lumbar spine fracture, mechanical fall, syncopal episode  MDM:  Feel the patient likely has a hip fracture given their fall.  Did appear to be a mechanical fall.  No syncopal episode or other preceding symptoms of be concerning.  Will obtain an x-ray of the hip.  No signs of lumbar spine fracture tenderness to palpation on exam that would warrant spinal imaging.  Does have skin tears on her arm which were repaired.  Will obtain an x-ray of the wrist since that looks like it may have been a fall on an outstretched wrist when she landed.  Plan:  Labs Hip x-ray Hip x-ray Pain medication  ED Summary/Re-evaluation:  Patient was found to have a acetabular and pubic rami fracture on x-rays.  CT of the pelvis was obtained for operative planning.  Orthopedics was consulted regarding operative repair who is planning to take to the OR. Given their age will be admitted to hospitalist for further management.    This patient presents to the ED for concern of complaints listed in HPI, this involves an extensive number of treatment options, and is a complaint that carries with it a high risk of complications and morbidity. Disposition including  potential need for admission considered.   Dispo: Admit to Floor  Additional history obtained from spouse Records reviewed Outpatient Clinic Notes The following labs were independently interpreted: Chemistry and show CKD I independently reviewed the following imaging with scope of interpretation limited to determining acute life threatening conditions related to emergency care: Extremity x-ray(s) and agree with the radiologist interpretation with the following exceptions: none I have reviewed the patients home medications and made adjustments as needed Consults: Hospitalist and Orthopedics Social Determinants of health:  Elderly   Final diagnoses:  Fall, initial encounter  Closed displaced fracture of left acetabulum, unspecified portion of acetabulum, initial encounter (HCC)  Closed fracture of multiple pubic rami, left, initial encounter [facility])    ED Discharge Orders     None          Yolande Lamar BROCKS, MD 05/31/24 [phone]

## 2024-05-31 NOTE — Transfer of Care (Signed)
 Immediate Anesthesia Transfer of Care Note  Patient: Deanna Acosta  Procedure(s) Performed: OPEN REDUCTION INTERNAL FIXATION (ORIF) ACETABULAR FRACTURE (Left: Shoulder)  Patient Location: PACU  Anesthesia Type:General  Level of Consciousness: awake and alert   Airway & Oxygen  Therapy: Patient Spontanous Breathing and Patient connected to nasal cannula oxygen   Post-op Assessment: Report given to RN and Post -op Vital signs reviewed and stable  Post vital signs: Reviewed and stable  Last Vitals:  Vitals Value Taken Time  BP 138/77 05/31/24 15:21  Temp    Pulse 74 05/31/24 15:23  Resp 13 05/31/24 15:23  SpO2 91 % 05/31/24 15:23  Vitals shown include unfiled device data.  Last Pain:  Vitals:   05/31/24 1142  TempSrc:   PainSc: 9          Complications: No notable events documented.

## 2024-05-31 NOTE — Anesthesia Postprocedure Evaluation (Signed)
 Anesthesia Post Note  Patient: Shalaine G Gunn  Procedure(s) Performed: OPEN REDUCTION INTERNAL FIXATION (ORIF) ACETABULAR FRACTURE (Left: Shoulder)     Patient location during evaluation: PACU Anesthesia Type: General Level of consciousness: awake and alert Pain management: pain level controlled Vital Signs Assessment: post-procedure vital signs reviewed and stable Respiratory status: spontaneous breathing, nonlabored ventilation, respiratory function stable and patient connected to nasal cannula oxygen  Cardiovascular status: blood pressure returned to baseline and stable Postop Assessment: no apparent nausea or vomiting Anesthetic complications: no   No notable events documented.  Last Vitals:  Vitals:   05/31/24 1645 05/31/24 1700  BP: (!) 168/63 (!) 149/74  Pulse: 66 69  Resp: 16 20  Temp:    SpO2: 92% 94%    Last Pain:  Vitals:   05/31/24 1645  TempSrc:   PainSc: Asleep                 Cordella SQUIBB Dorathy Stallone

## 2024-05-31 NOTE — ED Notes (Signed)
 Pt transported to Xray.

## 2024-05-31 NOTE — Op Note (Signed)
 05/31/2024  12:14 PM  PATIENT:  Deanna Acosta  [birth year] female   MEDICAL RECORD NUMBER: [id number]  PRE-OPERATIVE DIAGNOSIS:   1. LEFT PROTRUSIO HIP DISLOCATION 2. COMMINUTED LEFT ACETABULAR FRACTURE, BOTH COLUMN  POST-OPERATIVE DIAGNOSIS:   1. LEFT PROTRUSIO HIP DISLOCATION 2. COMMINUTED LEFT ACETABULAR FRACTURE, BOTH COLUMN  PROCEDURE:   OPEN REDUCTION INTERNAL FIXATION OF LEFT ACETABULAR FRACTURE INVOLVING THE WEIGHT BEARING DOME/ TWO COLUMNS USING ANTERIOR STOPPA APPROACH REDUCTION OF CENTRAL (PROTRUSIO) DISLOCATION  SURGEON:  Ozell DEL. Celena, M.D.  ASSISTANT:  Francis Mt, PA-C.  ANESTHESIA:  General.  COMPLICATIONS:  None.  EBL:  200 mL   BLOOD ADMINISTERED:none  DRAINS: none   LOCAL MEDICATIONS USED:  NONE  SPECIMEN:  No Specimen  DISPOSITION OF SPECIMEN:  N/A  COUNTS:  YES  TOURNIQUET:  * No tourniquets in log *`  DICTATION: Note written in EPIC  PLAN OF CARE: Admit to inpatient   PATIENT DISPOSITION:  PACU - hemodynamically stable.   Delay start of Pharmacological VTE agent (>24hrs) due to surgical blood loss or risk of bleeding: no  BRIEF SUMMARY OF INDICATION OF PROCEDURE:  Deanna Acosta is a very pleasant 88 y.o.-year-old who sustained a left acetabular fracture. After medical evaluation and resuscitation we recommended surgical repair to best restore function. I did discuss with the patient and family the option for nonoperative treatment as well as the risks and benefits of surgery including the potential for blood loss, infection, urologic injury, DVT, PE, heart attack, stroke, death, nerve and vessel injury, infection, and multiple others. We also specifically discussed urologic injury and possibility of hip arthritis which could necessitate conversion to total hip arthroplasty. These risks were acknowledged and consent provided to proceed.  BRIEF SUMMARY OF PROCEDURE:  The patient was taken to the operating room after administration  of antibiotics.  General anesthesia was induced. She was positioned supine with a bump under the operative side and ipsilateral hip flexed 40 degrees. A chlorhexidine  scrub and then full Betadine scrub and paint were then applied to the abdomen and iliac wing areas.  Time-out was held.   A Pfannenstiel incision of 9 cm was made superior to the pelvic brim. Dissection was carried down to the pyramidalis, which was tagged.  A vertical limb was made through the linea alba and retention sutures placed in the distal aspect.  I carefully made my way around the left pelvic brim.  I did not identify any significant disruption of the pubic symphysis.  I did identify the so-called corona mortis vessels bridging the obturator and epigastric systems.  Using several vascular clips, I secured and divided these and continued along the brim with the help of my assistant who used  lighted long blunt Meyerding retractors.  The femoral head was centrally dislocated within the pelvis. To reduce this, I applied interior pressure while my assistant pulled traction on the leg. This was successful in large part in getting the femoral head somewhat back in position so that we might address the fracture. We then were able to encounter the fracture site and associated hematoma, which was cleaned with curettes and lavage.  I applied the suprapectineal plate from [employer], secured fixation anteriorly, and then while reapplying lateral and caudal force to push the fracture into a reduced position using a ball spike pusher, I secured screw fixation into the sciatic buttress of the posterior column and retroacetabular area above the joint line. This reduced the anterior column and the posterior column components. I carefully placed  additional screws into the anterior column above and below the fracture site and also secured screws into the most inferior portions of the plate adjacent to the ischium and in the sciatic buttress of the  posterior column.   I checked AP and Judet films throughout and final films showed what appeared to be excellent reduction of the articular surface of the posterior column, of the anterior column, and then also of the iliac wing.  The pelvis was irrigated thoroughly and then closed in standard layered fashion using #1 Vicryl at the pelvic brim, #1 for the deep subcu, 0 Vicryl, 2-0 Vicryl, and 3-0 nylon along the pelvic brim.  We repaired the fascia directly over the iliac crest, and then 0 Vicryl, 2-0 Vicryl, and 3-0 nylon, and then a Mepilex dressing.  A malleable retractor was used to protect the bladder at all times while in the pelvis.  Francis Mt, PAC, assisted me throughout.  An assistant was absolutely necessary for the safe and effective completion of the case.  The patient was taken to the PACU in stable condition.  There were no complications during the procedure.  PROGNOSIS:  Deanna Acosta has sustained a complex injury and the repair has restored sufficient congruity and stability to the hip joint to enable bed-to-chair mobilization with touchdown weightbearing on the left lower extremity for the next 8 weeks and graduated weightbearing for the ensuing 4 weeks.  Formal pharmacologic DVT prophylaxis. There remains increased risk for perioperative complications given associated injuries, comorbidities, and age.  We will continue to follow closely as patient remains on the trauma service primarily.

## 2024-05-31 NOTE — Anesthesia Preprocedure Evaluation (Addendum)
 Anesthesia Evaluation  Patient identified by MRN, date of birth, ID band Patient awake    Reviewed: Allergy & Precautions, NPO status , Patient's Chart, lab work & pertinent test results, reviewed documented beta blocker date and time   History of Anesthesia Complications (+) PONV and history of anesthetic complications  Airway Mallampati: III  TM Distance: >3 FB Neck ROM: Full    Dental  (+) Edentulous Upper, Edentulous Lower, Dental Advisory Given   Pulmonary shortness of breath, asthma , COPD,  COPD inhaler, former smoker   Pulmonary exam normal breath sounds clear to auscultation       Cardiovascular hypertension, Pt. on medications and Pt. on home beta blockers Normal cardiovascular exam Rhythm:Regular Rate:Normal     Neuro/Psych  Headaches PSYCHIATRIC DISORDERS Anxiety Depression       GI/Hepatic Neg liver ROS, hiatal hernia,GERD  ,,  Endo/Other  negative endocrine ROS    Renal/GU Renal InsufficiencyRenal disease  negative genitourinary   Musculoskeletal  (+) Arthritis ,    Abdominal   Peds  Hematology negative hematology ROS (+)   Anesthesia Other Findings   Reproductive/Obstetrics                              Anesthesia Physical Anesthesia Plan  ASA: 3  Anesthesia Plan: General   Post-op Pain Management: Dilaudid  IV and Tylenol  PO (pre-op)*   Induction: Intravenous  PONV Risk Score and Plan: 4 or greater and Dexamethasone , Ondansetron  and Treatment may vary due to age or medical condition  Airway Management Planned: Oral ETT  Additional Equipment:   Intra-op Plan:   Post-operative Plan: Extubation in OR  Informed Consent: I have reviewed the patients History and Physical, chart, labs and discussed the procedure including the risks, benefits and alternatives for the proposed anesthesia with the patient or authorized representative who has indicated his/her  understanding and acceptance.     Dental advisory given  Plan Discussed with: CRNA  Anesthesia Plan Comments:          Anesthesia Quick Evaluation
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
## 2024-05-31 NOTE — Anesthesia Procedure Notes (Signed)
 Procedure Name: Intubation Date/Time: 05/31/2024 12:14 PM  Performed by: Roslynn Waddell LABOR, CRNAPre-anesthesia Checklist: Patient identified, Emergency Drugs available, Suction available and Patient being monitored Patient Re-evaluated:Patient Re-evaluated prior to induction Oxygen  Delivery Method: Circle System Utilized Preoxygenation: Pre-oxygenation with 100% oxygen  Induction Type: IV induction Ventilation: Oral airway inserted - appropriate to patient size Laryngoscope Size: Mac and 3 Grade View: Grade I Tube type: Oral Tube size: 7.0 mm Number of attempts: 1 Airway Equipment and Method: Stylet and Oral airway Placement Confirmation: ETT inserted through vocal cords under direct vision, positive ETCO2 and breath sounds checked- equal and bilateral Secured at: 21 cm Tube secured with: Tape Dental Injury: Teeth and Oropharynx as per pre-operative assessment  Comments: Atraumatic induction/intubation. Edentulous. Oral mucosa as per preop.

## 2024-05-31 NOTE — ED Triage Notes (Signed)
 Patient slipped and fell while walking to let her dog out. Patient complains of left hip pain. Patient is A&Ox4. No LOC. No thinners.

## 2024-06-01 ENCOUNTER — Encounter (HOSPITAL_COMMUNITY): Payer: Self-pay | Admitting: Orthopedic Surgery

## 2024-06-01 ENCOUNTER — Inpatient Hospital Stay (HOSPITAL_COMMUNITY)

## 2024-06-01 DIAGNOSIS — S32402A Unspecified fracture of left acetabulum, initial encounter for closed fracture: Secondary | ICD-10-CM | POA: Diagnosis not present

## 2024-06-01 LAB — GLUCOSE, CAPILLARY
Glucose-Capillary: 123 mg/dL — ABNORMAL HIGH (ref 70–99)
Glucose-Capillary: 126 mg/dL — ABNORMAL HIGH (ref 70–99)
Glucose-Capillary: 126 mg/dL — ABNORMAL HIGH (ref 70–99)
Glucose-Capillary: 145 mg/dL — ABNORMAL HIGH (ref 70–99)

## 2024-06-01 LAB — CBC
HCT: 31.5 % — ABNORMAL LOW (ref 36.0–46.0)
Hemoglobin: 10.6 g/dL — ABNORMAL LOW (ref 12.0–15.0)
MCH: 30.6 pg (ref 26.0–34.0)
MCHC: 33.7 g/dL (ref 30.0–36.0)
MCV: 91 fL (ref 80.0–100.0)
Platelets: 244 K/uL (ref 150–400)
RBC: 3.46 MIL/uL — ABNORMAL LOW (ref 3.87–5.11)
RDW: 12.5 % (ref 11.5–15.5)
WBC: 15.8 K/uL — ABNORMAL HIGH (ref 4.0–10.5)
nRBC: 0 % (ref 0.0–0.2)

## 2024-06-01 LAB — BASIC METABOLIC PANEL WITH GFR
Anion gap: 7 (ref 5–15)
BUN: 15 mg/dL (ref 8–23)
CO2: 23 mmol/L (ref 22–32)
Calcium: 8.7 mg/dL — ABNORMAL LOW (ref 8.9–10.3)
Chloride: 105 mmol/L (ref 98–111)
Creatinine, Ser: 1.26 mg/dL — ABNORMAL HIGH (ref 0.44–1.00)
GFR, Estimated: 42 mL/min — ABNORMAL LOW (ref >60)
Glucose, Bld: 123 mg/dL — ABNORMAL HIGH (ref 70–99)
Potassium: 4.1 mmol/L (ref 3.5–5.1)
Sodium: 135 mmol/L (ref 135–145)

## 2024-06-01 LAB — VITAMIN D 25 HYDROXY (VIT D DEFICIENCY, FRACTURES): Vit D, 25-Hydroxy: 64.82 ng/mL (ref 30–100)

## 2024-06-01 MED ORDER — FOLIC ACID 1 MG PO TABS
1.0000 mg | ORAL_TABLET | Freq: Every day | ORAL | Status: DC
  Administered 2024-06-02 – 2024-06-04 (×3): 1 mg via ORAL
  Filled 2024-06-01 (×3): qty 1

## 2024-06-01 MED ORDER — MELATONIN 5 MG PO TABS
5.0000 mg | ORAL_TABLET | Freq: Every evening | ORAL | Status: DC | PRN
  Administered 2024-06-02 – 2024-06-03 (×2): 5 mg via ORAL
  Filled 2024-06-01 (×2): qty 1

## 2024-06-01 MED ORDER — HALOPERIDOL LACTATE 5 MG/ML IJ SOLN
1.0000 mg | Freq: Once | INTRAMUSCULAR | Status: AC
  Administered 2024-06-01: 1 mg via INTRAMUSCULAR
  Filled 2024-06-01: qty 1

## 2024-06-01 MED ORDER — THIAMINE MONONITRATE 100 MG PO TABS
100.0000 mg | ORAL_TABLET | Freq: Every day | ORAL | Status: DC
  Administered 2024-06-02 – 2024-06-04 (×3): 100 mg via ORAL
  Filled 2024-06-01 (×3): qty 1

## 2024-06-01 MED ORDER — OXYCODONE HCL 5 MG PO TABS
5.0000 mg | ORAL_TABLET | ORAL | Status: DC | PRN
  Administered 2024-06-01 (×2): 5 mg via ORAL
  Filled 2024-06-01 (×2): qty 1

## 2024-06-01 NOTE — Plan of Care (Signed)

## 2024-06-01 NOTE — Evaluation (Signed)
 Physical Therapy Evaluation Patient Details Name: Deanna Acosta MRN: [id number] DOB: 16-Mar-1936 Today's Date: 06/01/2024  History of Present Illness  Deanna Acosta is a 88 y.o. female admitted 05/31/24 after fall sustaining a severely communicated and displaced left acetabular fracture and a minimally displaced left pubic ramus fracture. Pt s/p ORIF 8/26. PMHx: HTN, HLD, COPD, CKD, GERD, anxiety, depression, pre-diabetes, and osteoporosis.   Clinical Impression  Pt admitted with above diagnosis. PTA, pt was independent with functional mobility and ADLs. She lives with her husband in a one story house with 3 STE. Pt currently with functional limitations due to the deficits listed below (see PT Problem List). She required mod-maxA x2 for bed mobility and mod-maxA x2 for sit<>stand using RW. Pt sat EOB with CGA. She demonstrated a right lateral lean d/t LLE pain. Pt was able to stand for ~15 seconds with trunk flexed but quickly fatigued and reported worsening pain. Pt will benefit from acute skilled PT to increase her independence and safety with mobility to allow discharge. Recommend continued inpatient follow up therapy, <3 hours/day.     If plan is discharge home, recommend the following: Two people to help with walking and/or transfers;Two people to help with bathing/dressing/bathroom;Assist for transportation;Help with stairs or ramp for entrance   Can travel by private vehicle   No    Equipment Recommendations Hospital bed;Hoyer lift;Wheelchair (measurements PT)  Recommendations for Other Services       Functional Status Assessment Patient has had a recent decline in their functional status and demonstrates the ability to make significant improvements in function in a reasonable and predictable amount of time.     Precautions / Restrictions Precautions Precautions: Fall Recall of Precautions/Restrictions: Impaired Precaution/Restrictions Comments: watch  SpO2 Restrictions Weight Bearing Restrictions Per Provider Order: Yes LLE Weight Bearing Per Provider Order: Touchdown weight bearing Other Position/Activity Restrictions: No motion restrictions      Mobility  Bed Mobility Overal bed mobility: Needs Assistance Bed Mobility: Supine to Sit, Sit to Supine     Supine to sit: HOB elevated, Used rails, Mod assist, Max assist, +2 for physical assistance, +2 for safety/equipment Sit to supine: HOB elevated, Used rails, Mod assist, Max assist, +2 for physical assistance, +2 for safety/equipment   General bed mobility comments: Pt sat up on R side of bed with increased time. Cues for sequencing. Assist to manage LLE and trunk. Use of bed pad to pivot pt, elevate trunk, and scoot fwd til feet supported. Returning to bed assist to bring BLE back into bed and lower trunk down. Repositioned using bed features, bed pad, and +2 assist.    Transfers Overall transfer level: Needs assistance Equipment used: Rolling walker (2 wheels) Transfers: Sit to/from Stand Sit to Stand: From elevated surface, Mod assist, Max assist, +2 physical assistance, +2 safety/equipment           General transfer comment: Pt stood from an elevated bed height. Cued proper hand placement. Use of bed pad to facilitate full hip extension. Pt cleared hips from bed, but lacked upright posture. Maintaining trunk flex and quickly returning back to bed d/t pain and fatigue.    Ambulation/Gait               General Gait Details: Unable  Stairs            Wheelchair Mobility     Tilt Bed    Modified Rankin (Stroke Patients Only)       Balance Overall balance assessment: Needs assistance  Sitting-balance support: Bilateral upper extremity supported, Single extremity supported, No upper extremity supported, Feet unsupported Sitting balance-Leahy Scale: Fair Sitting balance - Comments: Pt sat EOB with CGA during ROM/MMT. She demonstrated a significant right  lateral lean d/t pain initially. Able to achieve netural posture with increased time. Postural control: Right lateral lean Standing balance support: Bilateral upper extremity supported, During functional activity, Reliant on assistive device for balance Standing balance-Leahy Scale: Poor Standing balance comment: Pt dependent on RW and +2 assist.                             Pertinent Vitals/Pain Pain Assessment Pain Assessment: 0-10 Pain Score: 5  Pain Location: L hip, Back, L shoulder, L hand Pain Descriptors / Indicators: Discomfort, Grimacing, Operative site guarding, Guarding, Aching Pain Intervention(s): Monitored during session, Limited activity within patient's tolerance, Repositioned, Patient requesting pain meds-RN notified    Home Living Family/patient expects to be discharged to:: Private residence Living Arrangements: Spouse/significant other Available Help at Discharge: Family;Available PRN/intermittently Type of Home: House Home Access: Stairs to enter Entrance Stairs-Rails: Right;Left;Can reach both Entrance Stairs-Number of Steps: 3   Home Layout: One level Home Equipment: Cane - single Librarian, academic (2 wheels);Shower seat;BSC/3in1;Grab bars - tub/shower;Hand held shower head      Prior Function Prior Level of Function : Independent/Modified Independent;History of Falls (last six months);Driving             Mobility Comments: Ambulates with out AD mainly, rarely uses a cane. Will hold onto a cart while shopping. Was playing golf up to last year. 1 fall leading to current admission. She had a few near falls from tripping or stumbling. ADLs Comments: Indep with ADLs. Pt prefers to bath in the tu instead of the shower. Her husband helps her sometimes in/out of the tub. Usually she uses the grab bars to manage on her own. She drove once in the last 6 mo. Husband manages her medications.     Extremity/Trunk Assessment   Upper Extremity  Assessment Upper Extremity Assessment: Defer to OT evaluation    Lower Extremity Assessment Lower Extremity Assessment: Generalized weakness;LLE deficits/detail LLE Deficits / Details: Pt with limited AROM. She actively guarded against PROM. Tension noted throughout hip. Grossly 2/5 hip strength, 3-/5 knee strength, and 3/5 ankle strength. 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 Titrated to 1L and pt's SpO2 88-89% with mobility. Bumped up to 2L with SpO2 90-93% SpO2.    Exercises     Assessment/Plan    PT Assessment Patient needs continued PT services  PT Problem List Decreased strength;Decreased range of motion;Decreased activity tolerance;Decreased balance;Decreased mobility;Decreased knowledge of use of DME;Decreased safety awareness;Pain  PT Treatment Interventions DME instruction;Gait training;Stair training;Functional mobility training;Therapeutic activities;Therapeutic exercise;Balance training;Patient/family education;Wheelchair mobility training    PT Goals (Current goals can be found in the Care Plan section)  Acute Rehab PT Goals Patient Stated Goal: Move on my own again PT Goal Formulation: With patient/family Time For Goal Achievement: 06/15/24 Potential to  Achieve Goals: Good    Frequency Min 2X/week     Co-evaluation PT/OT/SLP Co-Evaluation/Treatment: Yes Reason for Co-Treatment: For patient/therapist safety;To address functional/ADL transfers PT goals addressed during session: Mobility/safety with mobility;Balance         AM-PAC PT 6 Clicks Mobility  Outcome Measure Help needed turning from your back to your side while in a flat bed without using bedrails?: Total Help needed moving from lying on your back to sitting on the side of a flat bed without using bedrails?: Total Help needed moving to and from a bed to a chair (including a wheelchair)?: Total Help needed standing up from a chair using your arms (e.g., wheelchair or bedside chair)?: Total Help needed to walk in hospital room?: Total Help needed climbing 3-5 steps with a railing? : Total 6 Click Score: 6    End of Session Equipment Utilized During Treatment: Oxygen  Activity Tolerance: Patient tolerated treatment well;Patient limited by pain;Patient limited by fatigue Patient left: in bed;with call bell/phone within reach;with bed alarm set;with family/visitor present Nurse Communication: Mobility status;Need for lift equipment;Other (comment) (Stedy; wound vac was unplugged) PT Visit Diagnosis: Pain;Difficulty in walking, not elsewhere classified (R26.2);Unsteadiness on feet (R26.81);Other abnormalities of gait and mobility (R26.89) Pain - Right/Left: Left Pain - part of body: Shoulder;Hip    Time: 8988-8957 PT Time Calculation (min) (ACUTE ONLY): 31 min   Charges:   PT Evaluation $PT Eval Moderate Complexity: 1 Mod   PT General Charges $$ ACUTE PT VISIT: 1 Visit         Randall SAUNDERS, PT, DPT Acute Rehabilitation Services Office: [phone] Secure Chat Preferred  Delon CHRISTELLA Callander 06/01/2024, 12:13 PM

## 2024-06-01 NOTE — Evaluation (Signed)
 Occupational Therapy Evaluation Patient Details Name: PEIGHTON MEHRA MRN: [id number] DOB: Mar 11, 1936 Today's Date: 06/01/2024   History of Present Illness   MONALISA BAYLESS is a 88 y.o. female admitted 05/31/24 after fall sustaining a severely communicated and displaced left acetabular fracture and a minimally displaced left pubic ramus fracture. Pt s/p ORIF 8/26. PMHx: HTN, HLD, COPD, CKD, GERD, anxiety, depression, pre-diabetes, and osteoporosis.     Clinical Impressions At baseline, pt is largely Independent with ADLs and functional mobility with intermittent assist from husband for tub transfers and intermittent use of a SPC for functional mobility. At baseline, pt's husband assist's her with medication management. Pt now present with decreased activity tolerance, pain affecting functional level, generalized B UE strength, decreased balance, and decreased safety and independence with functional tasks. Pt currently demonstrates ability to largely complete ADLs with Set up to Total assist +2, bed mobility with Mod to Max assist +2, and functional STS transfers with a RW with Mod-Max assist +2. Pt on 3L O2 via [place] with SpO2 95% at beginning of session. Titrated to 1L and pt's SpO2 88-89% with mobility. Bumped up to 2L with SpO2 90-93% SpO2. Pt will benefit from acute skilled OT services to address deficits and increase safety and independence with functional tasks. Post acute discharge, pt will benefit from intensive inpatient skilled rehab services < 3 hours per day to maximize rehab potential.     If plan is discharge home, recommend the following:   Two people to help with walking and/or transfers;Two people to help with bathing/dressing/bathroom;Assistance with cooking/housework;Assist for transportation;Help with stairs or ramp for entrance     Functional Status Assessment   Patient has had a recent decline in their functional status and demonstrates the ability to make significant  improvements in function in a reasonable and predictable amount of time.     Equipment Recommendations   None recommended by OT (Pt already has needed equipment)     Recommendations for Other Services         Precautions/Restrictions   Precautions Precautions: Fall Recall of Precautions/Restrictions: Impaired Precaution/Restrictions Comments: watch SpO2 Restrictions Weight Bearing Restrictions Per Provider Order: Yes LLE Weight Bearing Per Provider Order: Touchdown weight bearing Other Position/Activity Restrictions: No motion restrictions     Mobility Bed Mobility Overal bed mobility: Needs Assistance Bed Mobility: Supine to Sit, Sit to Supine     Supine to sit: Mod assist, Max assist, +2 for physical assistance, +2 for safety/equipment, HOB elevated, Used rails Sit to supine: Mod assist, Max assist, +2 for physical assistance, +2 for safety/equipment, HOB elevated, Used rails   General bed mobility comments: Pt sat up on R side of bed with increased time. Cues for sequencing. Assist to manage LLE and trunk. Use of bed pad to pivot pt, elevate trunk, and scoot fwd til feet supported. Returning to bed assist to bring BLE back into bed and lower trunk down. Repositioned using bed features, bed pad, and +2 assist.    Transfers Overall transfer level: Needs assistance Equipment used: Rolling walker (2 wheels) Transfers: Sit to/from Stand Sit to Stand: Mod assist, Max assist, +2 physical assistance, +2 safety/equipment, From elevated surface           General transfer comment: Pt stood from an elevated bed height. Cued proper hand placement. Use of bed pad to facilitate full hip extension. Pt cleared hips from bed, but lacked upright posture. Maintaining trunk flex and quickly returning back to bed d/t pain and fatigue.  Balance Overall balance assessment: Needs assistance Sitting-balance support: Bilateral upper extremity supported, Single extremity supported,  No upper extremity supported, Feet unsupported Sitting balance-Leahy Scale: Fair Sitting balance - Comments: Pt sat EOB with CGA during ROM/MMT. She demonstrated a significant right lateral lean d/t pain initially. Able to achieve netural posture with increased time. Postural control: Right lateral lean Standing balance support: Bilateral upper extremity supported, During functional activity, Reliant on assistive device for balance Standing balance-Leahy Scale: Poor Standing balance comment: Pt dependent on RW and +2 assist.                           ADL either performed or assessed with clinical judgement   ADL Overall ADL's : Needs assistance/impaired Eating/Feeding: Set up;Sitting   Grooming: Set up;Contact guard assist;Sitting   Upper Body Bathing: Contact guard assist;Minimal assistance;Sitting   Lower Body Bathing: Maximal assistance;+2 for physical assistance;+2 for safety/equipment;Sit to/from stand;Cueing for compensatory techniques   Upper Body Dressing : Contact guard assist;Minimal assistance;Sitting   Lower Body Dressing: Maximal assistance;+2 for physical assistance;+2 for safety/equipment;Bed level     Toilet Transfer Details (indicate cue type and reason): unable Toileting- Clothing Manipulation and Hygiene: Total assistance;+2 for physical assistance;+2 for safety/equipment;Bed level         General ADL Comments: Pt with decreased activity tolerance and pain affecting funcitonal level     Vision Baseline Vision/History: 1 Wears glasses Ability to See in Adequate Light: 0 Adequate (with glasses on) Patient Visual Report: No change from baseline Vision Assessment?: No apparent visual deficits Additional Comments: Vision appears [facility] for tasks assessed with glasses on; not formally screened or evaluated     Perception         Praxis         Pertinent Vitals/Pain Pain Assessment Pain Assessment: 0-10 Pain Score: 5  Pain Location: L hip, Back,  L shoulder, L hand Pain Descriptors / Indicators: Discomfort, Grimacing, Operative site guarding, Guarding, Aching Pain Intervention(s): Limited activity within patient's tolerance, Monitored during session, Repositioned, Patient requesting pain meds-RN notified     Extremity/Trunk Assessment Upper Extremity Assessment Upper Extremity Assessment: Right hand dominant;Generalized weakness;RUE deficits/detail;LUE deficits/detail RUE Deficits / Details: generalized weakness; ROM WFL; coordination WFL; sensation WFL LUE Deficits / Details: generalized weakness; pt with abrasion on pad of palm near thumb secondary to fall leading to this admission; pt reporting pain in shoulder with AROM since fall leading to this admission, imaging this admission showed no fx; ROM WFL; coordination WFL; sensation WFL   Lower Extremity Assessment Lower Extremity Assessment: Defer to PT evaluation   Cervical / Trunk Assessment Cervical / Trunk Assessment: Back Surgery (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 Pt requiring increased time for processing and occasional visual or tactile cues during tasks. However, OT suspects this is due to pt being [facility] and not due to decreased cognition.                 Following commands: Intact (requires increased time for processing; suspect due to pt being [facility])       Cueing  General Comments   Cueing Techniques: Verbal cues;Gestural cues;Tactile cues;Visual  cues  Pt greeted on 3L O2 via [place] with SpO2 95%. Titrated to 1L and pt's SpO2 88-89% with mobility. Bumped up to 2L with SpO2 90-93% SpO2. Pt's husband  present and supportive throughout session.   Exercises     Shoulder Instructions      Home Living Family/patient expects to be discharged to:: Private residence Living Arrangements: Spouse/significant other Available Help at Discharge: Family;Available PRN/intermittently Type of Home: House Home Access: Stairs to enter [employer] of Steps: 3 Entrance Stairs-Rails: Right;Left;Can reach both Home Layout: One level     Bathroom Shower/Tub: Walk-in shower;Tub only   Bathroom Toilet: Standard     Home Equipment: Cane - single Librarian, academic (2 wheels);Shower seat;BSC/3in1;Grab bars - tub/shower;Hand held shower head          Prior Functioning/Environment Prior Level of Function : Independent/Modified Independent;History of Falls (last six months);Driving             Mobility Comments: Ambulates with out AD mainly, rarely uses a cane. Will hold onto a cart while shopping. Was playing golf up to last year. 1 fall leading to current admission. She had a few near falls from tripping or stumbling. ADLs Comments: Indep with ADLs. Pt prefers to bath in the tub instead of the shower. Her husband helps her sometimes in/out of the tub. Usually she uses the grab bars to manage on her own. She drove once in the last 6 mo. Husband manages her medications.    OT Problem List: Decreased strength;Decreased activity tolerance;Impaired balance (sitting and/or standing);Decreased knowledge of use of DME or AE;Decreased knowledge of precautions;Cardiopulmonary status limiting activity;Pain   OT Treatment/Interventions: Self-care/ADL training;Energy conservation;DME and/or AE instruction;Therapeutic exercise;Therapeutic activities;Patient/family education;Balance training      OT Goals(Current goals can be found in the care plan section)   Acute Rehab OT Goals Patient Stated Goal: to have less pain and return home OT Goal Formulation: With patient Time For Goal Achievement:  06/15/24 Potential to Achieve Goals: Good ADL Goals Pt Will Perform Grooming: with supervision;sitting (sitting EOB with Good balance for 3 or more minutes) Pt Will Perform Lower Body Bathing: with min assist;sitting/lateral leans;sit to/from stand (with adaptive equipment as needed) Pt Will Perform Lower Body Dressing: with min assist;sitting/lateral leans;sit to/from stand (with adaptive equipment as needed) Pt Will Transfer to Toilet: with min assist;ambulating;bedside commode (with least restrictive AD) Pt Will Perform Toileting - Clothing Manipulation and hygiene: with mod assist;sitting/lateral leans;sit to/from stand Pt/caregiver will Perform Home Exercise Program: Increased strength;Both right and left upper extremity;With Supervision;With written HEP provided (AROM progressing to theraband; increased activity tolerance)   OT Frequency:  Min 2X/week    Co-evaluation PT/OT/SLP Co-Evaluation/Treatment: Yes Reason for Co-Treatment: For patient/therapist safety;To address functional/ADL transfers   OT goals addressed during session: ADL's and self-care;Strengthening/ROM      AM-PAC OT 6 Clicks Daily Activity     Outcome Measure Help from another person eating meals?: A Little Help from another person taking care of personal grooming?: A Little Help from another person toileting, which includes using toliet, bedpan, or urinal?: Total Help from another person bathing (including washing, rinsing, drying)?: A Lot Help from another person to put on and taking off regular upper body clothing?: A Little Help from another person to put on and taking off regular lower body clothing?: Total 6 Click Score: 13   End of Session Equipment Utilized During Treatment: Gait belt;Rolling walker (2 wheels);Oxygen  Nurse Communication: Mobility status;Patient requests pain meds  Activity Tolerance: Patient limited by  pain;Patient tolerated treatment well;Patient limited by fatigue Patient left: in  bed;with call bell/phone within reach;with bed alarm set;with family/visitor present  OT Visit Diagnosis: Unsteadiness on feet (R26.81);Other abnormalities of gait and mobility (R26.89);History of falling (Z91.81);Pain                Time: 8988-8957 OT Time Calculation (min): 31 min Charges:  OT General Charges $OT Visit: 1 Visit OT Evaluation $OT Eval Moderate Complexity: 1 Mod  Margarie Rockey HERO., OTR/L, MA Acute Rehab [phone]   Margarie FORBES Horns 06/01/2024, 6:00 PM

## 2024-06-01 NOTE — TOC CAGE-AID Note (Signed)
 Transition of Care [facility]) - CAGE-AID Screening   Patient Details  Name: Deanna Acosta MRN: [id number] Date of Birth: [birth year]  Transition of [facility]) CM/SW Contact:    Mylo Driskill E Jozlynn Plaia, LCSW Phone Number: 06/01/2024, 10:55 AM   Clinical Narrative: Denies SA use.   CAGE-AID Screening:    Have You Ever Felt You Ought to Cut Down on Your Drinking or Drug Use?: No Have People Annoyed You By Critizing Your Drinking Or Drug Use?: No Have You Felt Bad Or Guilty About Your Drinking Or Drug Use?: No Have You Ever Had a Drink or Used Drugs First Thing In The Morning to Steady Your Nerves or to Get Rid of a Hangover?: No CAGE-AID Score: 0  Substance Abuse Education Offered: No

## 2024-06-01 NOTE — Progress Notes (Signed)
 Initial Nutrition Assessment  DOCUMENTATION CODES:   Non-severe (moderate) malnutrition in context of chronic illness (COPD, osteoporosis)  INTERVENTION:  Magic cup TID with meals, each supplement provides 290 kcal and 9 grams of protein  Encouraged adequate intake of meals and supplements to meet increased calorie and protein needs for post op healing  NUTRITION DIAGNOSIS:   Moderate Malnutrition related to chronic illness (COPD, osteoporsis) as evidenced by moderate fat depletion, mild muscle depletion, moderate muscle depletion.  GOAL:   Patient will meet greater than or equal to 90% of their needs  MONITOR:   PO intake, Supplement acceptance  REASON FOR ASSESSMENT:   Consult Assessment of nutrition requirement/status, Hip fracture protocol  ASSESSMENT:   Pt with hx of osteoporosis, GERD, COPD, HLD, HTN, depression/anxiety, and hard of hearing. Hx of laminectomy 11/2018. Admitted after fall at home with L hip pain, diagnosed displaced L acetabular fx and displaced L pubic ramus fx.  8/26 admitted; L acetabulum ORIF  Spoke with pt and pt's husband. Pt reports good appetite and reports eating close to 100% of breakfast. Pt reports no GI discomforts at this time. Discussed importance of protein intake to aid in post op recovery, pt agreeable to magic cup supplement but not Ensure shakes.  PTA, pt reports good appetite and intake of 2-3 meals per day. Pt reports eating variety of foods mostly consisting of soups, vegetables, and some protein. Protein options are usually fish, steak, hamburgers. Pt reports no avoidance of any foods. Reports drinking mostly water and diet sodas. Pt takes vitamin D3 daily for osteoporosis (vitamin D  labs while admitted normal). Pt reports no recent wt changes, wt hx in chart limited. Physical exam shows mild to moderate fat and muscle depletions, suspect depletions occurred over time and related to chronic under nutrition in the context of pt's  chronic COPD.  Will continue to monitor intake and need for any additional ONS.  PTA, pt reports using a cane occasionally but did not rely on it for mobility.   Medications reviewed and include:  Colace Senna Protonix  Vancomycin   Labs reviewed:  CBG x 24 hr: 126-130 mg/dL No recent J8r Vitamin D  64.82 (WNL)  NUTRITION - FOCUSED PHYSICAL EXAM:  Flowsheet Row Most Recent Value  Orbital Region Moderate depletion  Upper Arm Region Moderate depletion  Thoracic and Lumbar Region No depletion  Buccal Region Mild depletion  Temple Region Mild depletion  Clavicle Bone Region Mild depletion  Clavicle and Acromion Bone Region Mild depletion  Scapular Bone Region Moderate depletion  Dorsal Hand Moderate depletion  Patellar Region Mild depletion  Anterior Thigh Region Mild depletion  Posterior Calf Region Mild depletion  Edema (RD Assessment) None  Hair Reviewed  Eyes Reviewed  Mouth Reviewed  [missing bottom teeth]  Skin Reviewed  Nails Reviewed    Diet Order:   Diet Order             Diet regular Room service appropriate? Yes; Fluid consistency: Thin  Diet effective now                   EDUCATION NEEDS:   Education needs have been addressed  Skin:  Skin Assessment: Reviewed RN Assessment  Last BM:  PTA  Height:   Ht Readings from Last 1 Encounters:  05/31/24 5' 4 (1.626 m)    Weight:   Wt Readings from Last 1 Encounters:  05/31/24 82 kg    Ideal Body Weight:  54.5 kg  BMI:  Body mass index is 31.03  kg/m.  Estimated Nutritional Needs:   Kcal:  1500-1700  Protein:  65-85g  Fluid:  1.5-1.7L    Josette Glance, MS, RDN, LDN Clinical Dietitian I Please reach out via secure chat

## 2024-06-01 NOTE — Progress Notes (Signed)
     Patient Name: Deanna Acosta           DOB: [birth year]  MRN: [id number]      Admission Date: 05/31/2024  Attending Provider: Trixie Nilda HERO, MD  Primary Diagnosis: Left acetabular fracture [facility])   Level of care: Telemetry Surgical   OVERNIGHT EVENT  HPI/ Events of Note Deanna Acosta, 88 y.o. female, was admitted on 05/31/2024 for Left acetabular fracture (HCC).  Notified by bedside RN of patient being combative with nursing staff.   Altered mental status Husband reports patient is exhibiting new onset of confusion and agitation.  Hemodynamically stable. CBG 126. No other acute changes reported.  No other neuro deficits reported by RN.  Received oxycodone  earlier. Avoiding additional doses of opiates or sedating agents given confusion.    Plan: Work up-->  UA, CBC, CMP, ammonia, folic acid , thiamine , vitamin B12,  Plan to hold on benzodiazepines and add haloperidol  as needed for agitation.  Obtain measurement of QTc. Melatonin for sleep cycle Avoid polypharmacy and deliriogenic medications when possible Delirium precautions, frequently reorient patient Fall precautions    Lavanda Horns, DNP, ACNPC- AG [facility]

## 2024-06-01 NOTE — Progress Notes (Signed)
 PROGRESS NOTE  Deanna Acosta FMW:[id number] DOB: 1935/12/05 DOA: 05/31/2024 PCP: Cleotilde Planas, MD   LOS: 1 day   Brief Narrative / Interim history: 88 y.o. female with medical history significant of hypertension, hyperlipidemia, COPD, osteoporosis presents after having a fall at home.  She was found to have a left acetabular fracture status post operative repair.  Subjective / 24h Interval events: She is complaining of left shoulder pain this morning  Assesement and Plan: Principal Problem:   Left acetabular fracture [facility]) Active Problems:   Closed fracture of left inferior pubic ramus (HCC)   Fall at home, initial encounter   Leukocytosis   COPD (chronic obstructive pulmonary disease) (HCC)   HTN (hypertension)   Hyperlipidemia   Chronic kidney disease, stage III (moderate) (HCC)   GERD (gastroesophageal reflux disease)  Principal problem Left acetabular fracture, left inferior pubic ramus fracture-orthopedic surgery consulted, she was taken to the OR for repair on 8/26.  Continue pain control, PT/OT to evaluate today  Active problems Left shoulder pain-Will obtain imaging  Leukocytosis-likely due to 1  COPD-no wheezing, this is stable  Hypertension-continue home medications, blood pressure overall acceptable  Hyperlipidemia-continue statin  CKD 3A-creatinine at baseline  Chronic back pain-continue pain control  Obesity, class I-BMI 31  Scheduled Meds:  acetaminophen   1,000 mg Oral Q8H   aspirin   325 mg Oral Daily   diltiazem   120 mg Oral Daily   docusate sodium   100 mg Oral BID   enoxaparin  (LOVENOX ) injection  40 mg Subcutaneous Q24H   FLUoxetine   40 mg Oral QAC breakfast   hydrochlorothiazide   25 mg Oral Daily   lidocaine   1 patch Transdermal Q24H   metoprolol  succinate  100 mg Oral Daily   pantoprazole   40 mg Oral Daily   rosuvastatin   5 mg Oral Daily   senna-docusate  1 tablet Oral BID   Continuous Infusions:  0.9 % NaCl with KCl 20 mEq / L 10  mL/hr at 05/31/24 1821   PRN Meds:.diphenhydrAMINE , diphenoxylate -atropine , HYDROmorphone  (DILAUDID ) injection, ipratropium-albuterol , metoCLOPramide  **OR** metoCLOPramide  (REGLAN ) injection, ondansetron  **OR** ondansetron  (ZOFRAN ) IV, oxyCODONE   Current Outpatient Medications  Medication Instructions   acetaminophen  (TYLENOL ) 1,000 mg, Every 6 hours PRN   ADVAIR HFA 115-21 MCG/ACT inhaler 2 puffs, 2 times daily   albuterol  (PROVENTIL  HFA;VENTOLIN  HFA) 108 (90 Base) MCG/ACT inhaler 2 puffs, Every 6 hours PRN   albuterol  (PROVENTIL ) 2.5 mg, Nebulization, Every 4 hours PRN   aspirin  325 mg, Daily   chlorhexidine  (HIBICLENS ) 4 % external liquid 1 Application, Topical, As directed, Use as directed daily for 5 days every other week for 6 weeks.   cholecalciferol  (VITAMIN D3) 1,000 Units, Daily   diltiazem  (CARDIZEM  CD) 120 mg, Daily   diphenhydrAMINE  (BENADRYL ) 25 mg, Oral, Every 6 hours   diphenoxylate -atropine  (LOMOTIL ) 2.5-0.025 MG tablet 1-2 tablets, Oral, 4 times daily PRN   ferrous sulfate  325 mg, Daily with breakfast   FLUoxetine  (PROZAC ) 40 mg, Daily before breakfast   hydrochlorothiazide  (HYDRODIURIL ) 25 mg, Daily   metoprolol  succinate (TOPROL -XL) 100 mg, Daily   mupirocin  ointment (BACTROBAN ) 2 % 1 Application, Nasal, 2 times daily, Use as directed 2 times daily for 5 days every other week for 6 weeks.   omeprazole (PRILOSEC) 40 mg, Daily   potassium chloride  SA (KLOR-CON  M) 20 MEQ tablet 20 mEq, Oral, 2 times daily   rosuvastatin  (CRESTOR ) 5 mg, Daily    Diet Orders (From admission, onward)     Start     Ordered   05/31/24  1721  Diet regular Room service appropriate? Yes; Fluid consistency: Thin  Diet effective now       Question Answer Comment  Room service appropriate? Yes   Fluid consistency: Thin      05/31/24 1720            DVT prophylaxis: enoxaparin  (LOVENOX ) injection 40 mg Start: 06/01/24 0800 SCDs Start: 05/31/24 1721   Lab Results  Component Value  Date   PLT 244 06/01/2024      Code Status: Full Code  Family Communication: Family at bedside  Status is: Inpatient Remains inpatient appropriate because: severity of illness  Level of care: Telemetry Surgical  Consultants:  Orthopedic surgery   Objective: Vitals:   05/31/24 2059 06/01/24 0026 06/01/24 0418 06/01/24 0734  BP: 139/65 (!) 144/48 (!) 140/58 (!) 168/57  Pulse: 68 65 65 60  Resp:  15 16 16   Temp: 98.1 F (36.7 C) (!) 96.8 F (36 C) 98.1 F (36.7 C) 98.5 F (36.9 C)  TempSrc: Oral Axillary    SpO2: 93% 93% 94% 96%  Weight:      Height:        Intake/Output Summary (Last 24 hours) at 06/01/2024 0956 Last data filed at 06/01/2024 0700 Gross per 24 hour  Intake 1500 ml  Output 1425 ml  Net 75 ml   Wt Readings from Last 3 Encounters:  05/31/24 82 kg  07/19/22 82 kg  05/10/22 82 kg    Examination:  Constitutional: NAD Eyes: no scleral icterus ENMT: Mucous membranes are moist.  Neck: normal, supple Respiratory: clear to auscultation bilaterally, no wheezing, no crackles. Cardiovascular: Regular rate and rhythm, no murmurs / rubs / gallops.  Abdomen: non distended, no tenderness. Bowel sounds positive.  Musculoskeletal: no clubbing / cyanosis.    Data Reviewed: I have independently reviewed following labs and imaging studies  CBC Recent Labs  Lab 05/31/24 0720 06/01/24 0512  WBC 12.2* 15.8*  HGB 13.2 10.6*  HCT 39.9 31.5*  PLT 249 244  MCV 91.1 91.0  MCH 30.1 30.6  MCHC 33.1 33.7  RDW 12.6 12.5  LYMPHSABS 1.2  --   MONOABS 0.8  --   EOSABS 0.3  --   BASOSABS 0.1  --     Recent Labs  Lab 05/31/24 0720 06/01/24 0512  NA 138 135  K 4.1 4.1  CL 106 105  CO2 23 23  GLUCOSE 114* 123*  BUN 12 15  CREATININE 1.14* 1.26*  CALCIUM  9.2 8.7*    ------------------------------------------------------------------------------------------------------------------ No results for input(s): CHOL, HDL, LDLCALC, TRIG, CHOLHDL,  LDLDIRECT in the last 72 hours.  Lab Results  Component Value Date   HGBA1C 5.6 11/25/2018   ------------------------------------------------------------------------------------------------------------------ No results for input(s): TSH, T4TOTAL, T3FREE, THYROIDAB in the last 72 hours.  Invalid input(s): FREET3  Cardiac Enzymes No results for input(s): CKMB, TROPONINI, MYOGLOBIN in the last 168 hours.  Invalid input(s): CK ------------------------------------------------------------------------------------------------------------------ No results found for: BNP  CBG: Recent Labs  Lab 05/31/24 1059 06/01/24 0737  GLUCAP 130* 126*    Recent Results (from the past 240 hours)  Surgical pcr screen     Status: Abnormal   Collection Time: 05/31/24 10:40 AM   Specimen: Nasal Mucosa; Nasal Swab  Result Value Ref Range Status   MRSA, PCR NEGATIVE NEGATIVE Final   Staphylococcus aureus POSITIVE (A) NEGATIVE Final    Comment: (NOTE) The Xpert SA Assay (FDA approved for NASAL specimens in patients 47 years of age and older), is one component of a comprehensive  surveillance program. It is not intended to diagnose infection nor to guide or monitor treatment. Performed at [facility], [address], [place], [place] [address]      Radiology Studies: DG Pelvis Comp Min 3V Result Date: 05/31/2024 CLINICAL DATA:  Left acetabular fracture, status postoperative reduction and internal fixation EXAM: JUDET PELVIS - 3+ VIEW COMPARISON:  CT scan 05/31/2024 FINDINGS: Acetabular plate and screw fixators including an anterior acetabular wall fixator extending along the iliac bone, anterior wall, superior pubic ramus into the pubic body; and a component extending along the posterior acetabular wall. Marked improvement in alignment and resolution of prior protrusio appearance due to segmental fracture. Nondisplaced left inferior pubic ramus fracture again observed.  Radiodensities projecting along the sacral spinal canal favor residual oil-based contrast medium such as Pantopaque or Myodil, and have been present on prior exams including 01/26/2017. IMPRESSION: 1. Marked improvement in alignment of the left acetabular fracture status post ORIF. 2. Nondisplaced left inferior pubic ramus fracture. 3. Radiodensities projecting along the sacral spinal canal favor residual oil-based contrast medium such as Pantopaque or Myodil, and have been present on prior exams including 01/26/2017. Electronically Signed   By: Ryan Salvage M.D.   On: 05/31/2024 21:56   DG Pelvis Comp Min 3V Result Date: 05/31/2024 CLINICAL DATA:  461500 Elective surgery 461500 EXAM: JUDET PELVIS - 3+ VIEW COMPARISON:  Preoperative imaging FINDINGS: Ten fluoroscopic spot views of the pelvis and hips submitted from the operating room. Plate and screw fixation of comminuted acetabular fracture. Improved fracture alignment. Fluoroscopy time 21.8 seconds. Dose 6.73 mGy. IMPRESSION: Fluoroscopy during acetabular fracture ORIF. Electronically Signed   By: Andrea Gasman M.D.   On: 05/31/2024 15:34   DG C-Arm 1-60 Min-No Report Result Date: 05/31/2024 Fluoroscopy was utilized by the requesting physician.  No radiographic interpretation.   DG C-Arm 1-60 Min-No Report Result Date: 05/31/2024 Fluoroscopy was utilized by the requesting physician.  No radiographic interpretation.     Nilda Fendt, MD, PhD [facility]  Between 7 am - 7 pm I am available, please contact me via Amion (for emergencies) or Securechat (non urgent messages)  Between 7 pm - 7 am I am not available, please contact night coverage MD/APP via Amion

## 2024-06-01 NOTE — Progress Notes (Signed)
 Orthopaedic Trauma Service Progress Note  Patient ID: Deanna Acosta MRN: [id number] DOB/AGE: Apr 12, 1936 88 y.o.  Subjective:  Doing ok  Pain in L shoulder and L hip  Family at bedside  Uses can occasionally Lives with husband    ROS As above  Today's  total administered Morphine  Milligram Equivalents: 15 Yesterday's total administered Morphine  Milligram Equivalents: 115  Objective:   VITALS:   Vitals:   05/31/24 2059 06/01/24 0026 06/01/24 0418 06/01/24 0734  BP: 139/65 (!) 144/48 (!) 140/58 (!) 168/57  Pulse: 68 65 65 60  Resp:  15 16 16   Temp: 98.1 F (36.7 C) (!) 96.8 F (36 C) 98.1 F (36.7 C) 98.5 F (36.9 C)  TempSrc: Oral Axillary    SpO2: 93% 93% 94% 96%  Weight:      Height:        Estimated body mass index is 31.03 kg/m as calculated from the following:   Height as of this encounter: 5' 4 (1.626 m).   Weight as of this encounter: 82 kg.   Intake/Output      08/26 0701 08/27 0700 08/27 0701 08/28 0700   I.V. (mL/kg) 1000 (12.2)    IV Piggyback 500    Total Intake(mL/kg) 1500 (18.3)    Urine (mL/kg/hr) 1225 (0.6)    Blood 200    Total Output 1425    Net +75           LABS  Results for orders placed or performed during the hospital encounter of 05/31/24 (from the past 24 hours)  Surgical pcr screen     Status: Abnormal   Collection Time: 05/31/24 10:40 AM   Specimen: Nasal Mucosa; Nasal Swab  Result Value Ref Range   MRSA, PCR NEGATIVE NEGATIVE   Staphylococcus aureus POSITIVE (A) NEGATIVE  Glucose, capillary     Status: Abnormal   Collection Time: 05/31/24 10:59 AM  Result Value Ref Range   Glucose-Capillary 130 (H) 70 - 99 mg/dL  CBC     Status: Abnormal   Collection Time: 06/01/24  5:12 AM  Result Value Ref Range   WBC 15.8 (H) 4.0 - 10.5 K/uL   RBC 3.46 (L) 3.87 - 5.11 MIL/uL   Hemoglobin 10.6 (L) 12.0 - 15.0 g/dL   HCT 68.4 (L) 63.9 - 53.9 %    MCV 91.0 80.0 - 100.0 fL   MCH 30.6 26.0 - 34.0 pg   MCHC 33.7 30.0 - 36.0 g/dL   RDW 87.4 88.4 - 84.4 %   Platelets 244 150 - 400 K/uL   nRBC 0.0 0.0 - 0.2 %  Basic metabolic panel     Status: Abnormal   Collection Time: 06/01/24  5:12 AM  Result Value Ref Range   Sodium 135 135 - 145 mmol/L   Potassium 4.1 3.5 - 5.1 mmol/L   Chloride 105 98 - 111 mmol/L   CO2 23 22 - 32 mmol/L   Glucose, Bld 123 (H) 70 - 99 mg/dL   BUN 15 8 - 23 mg/dL   Creatinine, Ser 8.73 (H) 0.44 - 1.00 mg/dL   Calcium  8.7 (L) 8.9 - 10.3 mg/dL   GFR, Estimated 42 (L) >60 mL/min   Anion gap 7 5 - 15  Glucose, capillary     Status: Abnormal   Collection Time: 06/01/24  7:37  AM  Result Value Ref Range   Glucose-Capillary 126 (H) 70 - 99 mg/dL   Comment 1 Notify RN    Comment 2 Document in Chart      PHYSICAL EXAM:   Gen: sitting up in bed, NAD, looks comfortable  Lungs: unlabored Cardiac: reg Abd: NTND, + BS Pelvis:       Prevena over pfannenstiel incision is stable  Report leak on prevena but seal looks good        Ext warm        + DP pulse       No DCT        DPN, SPN, TN sensation intact       Obturator sensation and motor intact  Ext:        EHL, FHL, lesser toe motor intact       Ankle flexion, extension, inversion, eversion intact  Left upper extremity   Skin tear L hand stable, steri strips intact     Tolerates shoulder ROM actively and passively       Ext warm    + radial pulse  Assessment/Plan: 1 Day Post-Op   Principal Problem:   Left acetabular fracture (HCC) Active Problems:   HTN (hypertension)   Chronic kidney disease, stage III (moderate) (HCC)   COPD (chronic obstructive pulmonary disease) (HCC)   Closed fracture of left inferior pubic ramus (HCC)   Fall at home, initial encounter   Leukocytosis   Hyperlipidemia   GERD (gastroesophageal reflux disease)   Anti-infectives (From admission, onward)    Start     Dose/Rate Route Frequency Ordered Stop   06/01/24 0100   vancomycin  (VANCOCIN ) IVPB 1000 mg/200 mL premix        1,000 mg 200 mL/hr over 60 Minutes Intravenous Every 12 hours 05/31/24 1720 06/01/24 0200   05/31/24 1155  vancomycin  (VANCOCIN ) 1-5 GM/200ML-% IVPB       Note to Pharmacy: Roslynn Birmingham: cabinet override      05/31/24 1155 05/31/24 2359     .  POD/HD#: 1  88 y/o female s/p fall with fragility fracture of left acetabulum   - fall  - L acetabulum fracture with central protrusion s/p ORIF   TDWB L lower extremity   No motion restrictions   Therapy evals   Dressing change prior to dc   TOC consult for SNF  - L shoulder pain   Xrays negative for fracture  Activity as tolerated   Possible RTC injury, will monitor   Ice or heat   -  L hand skin tear  Local care   - Pain management:  Multimodal  - ABL anemia/Hemodynamics  Stable  Cbc in am   - Medical issues   Per primary   - DVT/PE prophylaxis:  Lovenox   - ID:   Periop abx   - Metabolic Bone Disease:  Fracture is a fragility fracture= osteoporosis  Vitamin d  levels pending   - Activity:  As above  - Impediments to fracture healing:  Fragility fracture   COPD  - Dispo:  Therapy evals  TOC consult for SNF     Francis MICAEL Mt, PA-C [phone] (C) 06/01/2024, 8:44 AM  [facility] [address] [place] [address] [phone] MAXIMINO MILLING (F)    After 5pm and on the weekends please log on to Amion, go to orthopaedics and the look under the [facility] Call for the provider(s) on call. You can also call our office at [phone]  and then follow the prompts to be connected to the call team.  Patient ID: Deanna Acosta, female   DOB: 01-Jul-1936, 88 y.o.   MRN: [id number]

## 2024-06-02 DIAGNOSIS — E440 Moderate protein-calorie malnutrition: Secondary | ICD-10-CM | POA: Insufficient documentation

## 2024-06-02 DIAGNOSIS — S32402A Unspecified fracture of left acetabulum, initial encounter for closed fracture: Secondary | ICD-10-CM | POA: Diagnosis not present

## 2024-06-02 LAB — BASIC METABOLIC PANEL WITH GFR
Anion gap: 11 (ref 5–15)
BUN: 23 mg/dL (ref 8–23)
CO2: 22 mmol/L (ref 22–32)
Calcium: 8.9 mg/dL (ref 8.9–10.3)
Chloride: 104 mmol/L (ref 98–111)
Creatinine, Ser: 1.37 mg/dL — ABNORMAL HIGH (ref 0.44–1.00)
GFR, Estimated: 38 mL/min — ABNORMAL LOW (ref >60)
Glucose, Bld: 120 mg/dL — ABNORMAL HIGH (ref 70–99)
Potassium: 3.9 mmol/L (ref 3.5–5.1)
Sodium: 137 mmol/L (ref 135–145)

## 2024-06-02 LAB — URINALYSIS, ROUTINE W REFLEX MICROSCOPIC
Bacteria, UA: NONE SEEN
Bilirubin Urine: NEGATIVE
Glucose, UA: NEGATIVE mg/dL
Hgb urine dipstick: NEGATIVE
Ketones, ur: NEGATIVE mg/dL
Nitrite: NEGATIVE
Protein, ur: NEGATIVE mg/dL
Specific Gravity, Urine: 1.013 (ref 1.005–1.030)
pH: 5 (ref 5.0–8.0)

## 2024-06-02 LAB — CBC
HCT: 30.2 % — ABNORMAL LOW (ref 36.0–46.0)
Hemoglobin: 10 g/dL — ABNORMAL LOW (ref 12.0–15.0)
MCH: 30.4 pg (ref 26.0–34.0)
MCHC: 33.1 g/dL (ref 30.0–36.0)
MCV: 91.8 fL (ref 80.0–100.0)
Platelets: 217 K/uL (ref 150–400)
RBC: 3.29 MIL/uL — ABNORMAL LOW (ref 3.87–5.11)
RDW: 12.7 % (ref 11.5–15.5)
WBC: 17.2 K/uL — ABNORMAL HIGH (ref 4.0–10.5)
nRBC: 0 % (ref 0.0–0.2)

## 2024-06-02 LAB — MAGNESIUM: Magnesium: 1.6 mg/dL — ABNORMAL LOW (ref 1.7–2.4)

## 2024-06-02 LAB — AMMONIA: Ammonia: 25 umol/L (ref 9–35)

## 2024-06-02 LAB — VITAMIN B12: Vitamin B-12: 209 pg/mL (ref 180–914)

## 2024-06-02 MED ORDER — HALOPERIDOL LACTATE 5 MG/ML IJ SOLN
2.0000 mg | Freq: Once | INTRAMUSCULAR | Status: AC
  Administered 2024-06-02: 2 mg via INTRAMUSCULAR
  Filled 2024-06-02: qty 1

## 2024-06-02 MED ORDER — KETOROLAC TROMETHAMINE 15 MG/ML IJ SOLN
15.0000 mg | Freq: Four times a day (QID) | INTRAMUSCULAR | Status: DC | PRN
  Administered 2024-06-02 – 2024-06-03 (×4): 15 mg via INTRAVENOUS
  Filled 2024-06-02 (×4): qty 1

## 2024-06-02 MED ORDER — CYANOCOBALAMIN 1000 MCG/ML IJ SOLN
1000.0000 ug | Freq: Once | INTRAMUSCULAR | Status: AC
  Administered 2024-06-02: 1000 ug via INTRAMUSCULAR
  Filled 2024-06-02: qty 1

## 2024-06-02 MED ORDER — MAGNESIUM SULFATE 2 GM/50ML IV SOLN
2.0000 g | Freq: Once | INTRAVENOUS | Status: AC
  Administered 2024-06-02: 2 g via INTRAVENOUS
  Filled 2024-06-02: qty 50

## 2024-06-02 NOTE — TOC Initial Note (Signed)
 Transition of Care [facility]) - Initial/Assessment Note    Patient Details  Name: Deanna Acosta MRN: [id number] Date of Birth: 05/12/1936  [facility] CM/SW Contact:    Bridget Cordella Simmonds, LCSW Phone Number: 06/02/2024, 1:27 PM  Clinical Narrative:      Pt oriented x1, able to converse with CSW, reports her husband is coming to the hospital soon.  Unable to reach husband by phone  1115: CSW spoke with pt son in [place], [phone] in room. He was able to get pt husband [name] on speakerphone, discussed PT recommendation for SNF, [name] is agreeable, permission given to send out referral in the hub.  Pt from home with [name].    1315: Referral sent out in hub for SNF.          Expected Discharge Plan: Skilled Nursing Facility Barriers to Discharge: Continued Medical Work up, SNF Pending bed offer   Patient Goals and CMS Choice            Expected Discharge Plan and Services In-house Referral: Clinical Social Work   Post Acute Care Choice: Skilled Nursing Facility Living arrangements for the past 2 months: Single Family Home                                      Prior Living Arrangements/Services Living arrangements for the past 2 months: Single Family Home Lives with:: Spouse Patient language and need for interpreter reviewed:: Yes        Need for Family Participation in Patient Care: Yes (Comment) Care giver support system in place?: Yes (comment)   Criminal Activity/Legal Involvement Pertinent to Current Situation/Hospitalization: No - Comment as needed  Activities of Daily Living   ADL Screening (condition at time of admission) Independently performs ADLs?: No Does the patient have a NEW difficulty with bathing/dressing/toileting/self-feeding that is expected to last >3 days?: Yes (Initiates electronic notice to provider for possible OT consult) Does the patient have a NEW difficulty with getting in/out of bed, walking, or climbing stairs  that is expected to last >3 days?: Yes (Initiates electronic notice to provider for possible PT consult) Does the patient have a NEW difficulty with communication that is expected to last >3 days?: Yes (Initiates electronic notice to provider for possible SLP consult) Is the patient deaf or have difficulty hearing?: Yes Does the patient have difficulty seeing, even when wearing glasses/contacts?: No Does the patient have difficulty concentrating, remembering, or making decisions?: No  Permission Sought/Granted                  Emotional Assessment Appearance:: Appears stated age Attitude/Demeanor/Rapport: Engaged Affect (typically observed): Pleasant Orientation: : Oriented to Self      Admission diagnosis:  Left acetabular fracture (HCC) [S32.402A] Fall, initial encounter [W19.XXXA] Closed fracture of multiple pubic rami, left, initial encounter (HCC) [S32.592A] Closed displaced fracture of left acetabulum, unspecified portion of acetabulum, initial encounter [facility]) [S32.402A] Patient Active Problem List   Diagnosis Date Noted   Malnutrition of moderate degree 06/02/2024   Left acetabular fracture (HCC) 05/31/2024   Closed fracture of left inferior pubic ramus (HCC) 05/31/2024   Fall at home, initial encounter 05/31/2024   Leukocytosis 05/31/2024   Hyperlipidemia 05/31/2024   GERD (gastroesophageal reflux disease) 05/31/2024   COVID-19 virus infection 12/23/2021   Acute respiratory failure with hypoxia (HCC) 12/22/2021   AKI (acute kidney injury) (HCC) 01/22/2021   S/P  lumbar laminectomy 11/29/2018   Dysphagia    Gastroesophageal reflux disease with esophagitis    Foreign body in esophagus    Esophageal dysmotility 12/13/2017   Aspiration pneumonia due to vomit (HCC) 12/13/2017   Status post total knee replacement, right 12/11/2017   Varicose veins of bilateral lower extremities with other complications 12/08/2016   Influenza A 10/21/2016   COPD (chronic obstructive  pulmonary disease) (HCC) 09/26/2016   Headache 09/24/2012   COPD exacerbation (HCC) 09/23/2012   Hypoxemia 09/23/2012   Dehydration 09/23/2012   HTN (hypertension) 09/23/2012   Chronic kidney disease, stage III (moderate) (HCC) 09/23/2012   PCP:  Cleotilde Planas, MD Pharmacy:   [facility] [address], [place] - [address] [place] [address] Phone: [phone] Fax: [phone]  [facility] [address] [place] [address] Phone: [phone] Fax: [phone]     Social Drivers of Health (SDOH) Social History: SDOH Screenings   Food Insecurity: No Food Insecurity (06/01/2024)  Housing: Low Risk  (06/01/2024)  Tobacco Use: Medium Risk (05/31/2024)   SDOH Interventions:     Readmission Risk Interventions     No data to display

## 2024-06-02 NOTE — Progress Notes (Signed)
 Physical Therapy Treatment Patient Details Name: Deanna Acosta MRN: [id number] DOB: 26-Oct-1935 Today's Date: 06/02/2024   History of Present Illness Deanna Acosta is a 88 y.o. female admitted 05/31/24 after fall sustaining a severely communicated and displaced left acetabular fracture and a minimally displaced left pubic ramus fracture. Pt s/p ORIF 8/26. PMHx: HTN, HLD, COPD, CKD, GERD, anxiety, depression, pre-diabetes, and osteoporosis.    PT Comments  RN requested assistance with helping pt back to bed. Pt had increased difficulty powering up from a lower surface. Assisted pt with use of bed pad to scoot to edge of recliner chair as well as stand by facilitating increased hip extension. Pt was limited by pain and fatigue. Overall, she required maxA x2. Patient will benefit from continued inpatient follow up therapy, <3 hours/day.   If plan is discharge home, recommend the following: Two people to help with walking and/or transfers;Two people to help with bathing/dressing/bathroom;Assist for transportation;Help with stairs or ramp for entrance   Can travel by private vehicle     No  Equipment Recommendations  Hospital bed;Hoyer lift;Wheelchair (measurements PT)    Recommendations for Other Services       Precautions / Restrictions Precautions Precautions: Fall Recall of Precautions/Restrictions: Impaired Precaution/Restrictions Comments: watch SpO2; Wound Vac; Pt needed assist to abide by weight bearing precautions Restrictions Weight Bearing Restrictions Per Provider Order: Yes LLE Weight Bearing Per Provider Order: Touchdown weight bearing Other Position/Activity Restrictions: No motion restrictions     Mobility  Bed Mobility Overal bed mobility: Needs Assistance Bed Mobility: Sit to Supine     Sit to supine: Max assist, +2 for physical assistance, +2 for safety/equipment   General bed mobility comments: Returned pt to bed. NT managed trunk and PT managed BLEs.  Repositioned using bed features, bed pad, and +2 assist. Pt with limited participation d/t worsening pain.    Transfers Overall transfer level: Needs assistance Equipment used: Ambulation equipment used Transfers: Bed to chair/wheelchair/BSC           General transfer comment: Pt transferred from recliner chair back to bed with use of stedy. PT and RN scooted pt to edge of chair with use of bed pad so pt's knees were block by bottom of stedy. Instructed RN to use bed pad pt was sitting on to aid in standing as it would facilitate hip extension. MaxA x2 to stand. Posterior to anterior force at pelvis applied in order to get stedy seats underneath pt. Moved pt infront of bed. MaxA x2 to stand from raised stedy seat and control pt down to bed. Transfer via Lift Equipment: Stedy  Ambulation/Gait               General Gait Details: Theme park manager Bed    Modified Rankin (Stroke Patients Only)       Balance Overall balance assessment: Needs assistance Sitting-balance support: Bilateral upper extremity supported, Feet supported Sitting balance-Leahy Scale: Fair Sitting balance - Comments: Pt sat on recliner chair with supervision and EOB with CGA x2. Postural control: Right lateral lean Standing balance support: Bilateral upper extremity supported, During functional activity Standing balance-Leahy Scale: Zero Standing balance comment: Pt dependent on stedy and +2 assist.                            Communication Communication Communication: Impaired Factors Affecting  Communication: Hearing impaired (significantly HOH)  Cognition Arousal: Alert Behavior During Therapy: WFL for tasks assessed/performed   PT - Cognitive impairments: Awareness, Sequencing, Problem solving, Safety/Judgement                       PT - Cognition Comments: Pt confused, in hospital-acquired delirium. She requires increased  time for processing; suspect due to pt being [facility]. Following commands: Intact      Cueing Cueing Techniques: Verbal cues, Gestural cues, Tactile cues, Visual cues  Exercises      General Comments General comments (skin integrity, edema, etc.): VSS on 3L O2 via [place]      Pertinent Vitals/Pain Pain Assessment Pain Assessment: Faces Faces Pain Scale: Hurts whole lot Pain Location: L shoulder, L back, and L hip Pain Descriptors / Indicators: Discomfort, Grimacing, Operative site guarding, Guarding, Aching Pain Intervention(s): Monitored during session, Limited activity within patient's tolerance, Repositioned    Home Living                          Prior Function            PT Goals (current goals can now be found in the care plan section) Acute Rehab PT Goals Patient Stated Goal: Get back to bed Progress towards PT goals: Not progressing toward goals - comment (limited by pain and fatigue)    Frequency    Min 2X/week      PT Plan      Co-evaluation              AM-PAC PT 6 Clicks Mobility   Outcome Measure  Help needed turning from your back to your side while in a flat bed without using bedrails?: Total Help needed moving from lying on your back to sitting on the side of a flat bed without using bedrails?: Total Help needed moving to and from a bed to a chair (including a wheelchair)?: Total Help needed standing up from a chair using your arms (e.g., wheelchair or bedside chair)?: Total Help needed to walk in hospital room?: Total Help needed climbing 3-5 steps with a railing? : Total 6 Click Score: 6    End of Session Equipment Utilized During Treatment: Gait belt;Oxygen  Activity Tolerance: Patient limited by pain;Patient limited by fatigue Patient left: in bed;with call bell/phone within reach;with bed alarm set Nurse Communication: Mobility status;Need for lift equipment PT Visit Diagnosis: Pain;Difficulty in walking, not elsewhere  classified (R26.2);Unsteadiness on feet (R26.81);Other abnormalities of gait and mobility (R26.89) Pain - Right/Left: Left Pain - part of body: Shoulder;Hip     Time: 8457-8448 PT Time Calculation (min) (ACUTE ONLY): 9 min  Charges:    $Therapeutic Activity: 8-22 mins PT General Charges $$ ACUTE PT VISIT: 1 Visit                     Randall SAUNDERS, PT, DPT Acute Rehabilitation Services Office: [phone] Secure Chat Preferred  Delon CHRISTELLA Callander 06/02/2024, 4:21 PM

## 2024-06-02 NOTE — Progress Notes (Signed)
 PROGRESS NOTE  Deanna Acosta FMW:[id number] DOB: 10-06-1936 DOA: 05/31/2024 PCP: Cleotilde Planas, MD   LOS: 2 days   Brief Narrative / Interim history: 88 y.o. female with medical history significant of hypertension, hyperlipidemia, COPD, osteoporosis presents after having a fall at home.  She was found to have a left acetabular fracture status post operative repair.  Subjective / 24h Interval events: More confusion this morning, she tells me repeatedly that she wants to go home.  She does not know where she is or why she is here.  She noticed 2025, thinks the month is September  Assesement and Plan: Principal Problem:   Left acetabular fracture [facility]) Active Problems:   Closed fracture of left inferior pubic ramus (HCC)   Fall at home, initial encounter   Leukocytosis   COPD (chronic obstructive pulmonary disease) (HCC)   HTN (hypertension)   Hyperlipidemia   Chronic kidney disease, stage III (moderate) (HCC)   GERD (gastroesophageal reflux disease)   Malnutrition of moderate degree  Principal problem Left acetabular fracture, left inferior pubic ramus fracture-orthopedic surgery consulted, she was taken to the OR for repair on 8/26.  - PT/OT recommending SNF, discussed with TOC, placement pending  Active problems Left shoulder pain-imaging without acute fractures but possible calcific tendinopathy.  Symptomatic management  In hospital-acquired delirium-more delirious overnight, there is no history of dementia.  Suspect multifactorial in the setting of being hospitalized with an acute illness, general anesthesia as well as use of oxycodone  - Minimize narcotics, delirium precautions  Leukocytosis-likely due to 1.  White count rising today, closely monitor.  She is afebrile  Borderline low B12-replenish  Hypomagnesemia-replenish magnesium   COPD-no wheezing, this is stable  Hypertension-continue home medications, Blood pressure acceptable  Hyperlipidemia-continue  statin  CKD 3A-creatinine close to baseline  Chronic back pain-continue pain control, DC oxycodone  due to delirium  Obesity, class I-BMI 31  Scheduled Meds:  acetaminophen   1,000 mg Oral Q8H   aspirin   325 mg Oral Daily   diltiazem   120 mg Oral Daily   docusate sodium   100 mg Oral BID   enoxaparin  (LOVENOX ) injection  40 mg Subcutaneous Q24H   FLUoxetine   40 mg Oral QAC breakfast   folic acid   1 mg Oral Daily   hydrochlorothiazide   25 mg Oral Daily   lidocaine   1 patch Transdermal Q24H   metoprolol  succinate  100 mg Oral Daily   pantoprazole   40 mg Oral Daily   rosuvastatin   5 mg Oral Daily   senna-docusate  1 tablet Oral BID   thiamine   100 mg Oral Daily   Continuous Infusions:  0.9 % NaCl with KCl 20 mEq / L 10 mL/hr at 05/31/24 1821   PRN Meds:.diphenhydrAMINE , diphenoxylate -atropine , ipratropium-albuterol , melatonin, metoCLOPramide  **OR** metoCLOPramide  (REGLAN ) injection, ondansetron  **OR** ondansetron  (ZOFRAN ) IV  Current Outpatient Medications  Medication Instructions   acetaminophen  (TYLENOL ) 1,000 mg, Every 6 hours PRN   ADVAIR HFA 115-21 MCG/ACT inhaler 2 puffs, 2 times daily   albuterol  (PROVENTIL  HFA;VENTOLIN  HFA) 108 (90 Base) MCG/ACT inhaler 2 puffs, Every 6 hours PRN   albuterol  (PROVENTIL ) 2.5 mg, Nebulization, Every 4 hours PRN   aspirin  325 mg, Daily   chlorhexidine  (HIBICLENS ) 4 % external liquid 1 Application, Topical, As directed, Use as directed daily for 5 days every other week for 6 weeks.   cholecalciferol  (VITAMIN D3) 1,000 Units, Daily   diltiazem  (CARDIZEM  CD) 120 mg, Daily   diphenhydrAMINE  (BENADRYL ) 25 mg, Oral, Every 6 hours   diphenoxylate -atropine  (LOMOTIL ) 2.5-0.025 MG tablet 1-2  tablets, Oral, 4 times daily PRN   ferrous sulfate  325 mg, Daily with breakfast   FLUoxetine  (PROZAC ) 40 mg, Daily before breakfast   hydrochlorothiazide  (HYDRODIURIL ) 25 mg, Daily   metoprolol  succinate (TOPROL -XL) 100 mg, Daily   mupirocin  ointment (BACTROBAN )  2 % 1 Application, Nasal, 2 times daily, Use as directed 2 times daily for 5 days every other week for 6 weeks.   omeprazole (PRILOSEC) 40 mg, Daily   potassium chloride  SA (KLOR-CON  M) 20 MEQ tablet 20 mEq, Oral, 2 times daily   rosuvastatin  (CRESTOR ) 5 mg, Daily    Diet Orders (From admission, onward)     Start     Ordered   05/31/24 1721  Diet regular Room service appropriate? Yes; Fluid consistency: Thin  Diet effective now       Question Answer Comment  Room service appropriate? Yes   Fluid consistency: Thin      05/31/24 1720            DVT prophylaxis: enoxaparin  (LOVENOX ) injection 40 mg Start: 06/01/24 0800 SCDs Start: 05/31/24 1721   Lab Results  Component Value Date   PLT 217 06/02/2024      Code Status: Full Code  Family Communication: Family at bedside  Status is: Inpatient Remains inpatient appropriate because: severity of illness  Level of care: Telemetry Surgical  Consultants:  Orthopedic surgery   Objective: Vitals:   06/01/24 1425 06/01/24 1957 06/02/24 0407 06/02/24 0723  BP: (!) 106/48 (!) 120/42 (!) 153/56 (!) 124/50  Pulse: 63 61 78 78  Resp: 16 18 18    Temp: 98.6 F (37 C) (!) 97.5 F (36.4 C) 97.8 F (36.6 C) 98.3 F (36.8 C)  TempSrc:   Oral Oral  SpO2: 90% 95% 95% 92%  Weight:      Height:        Intake/Output Summary (Last 24 hours) at 06/02/2024 9076 Last data filed at 06/02/2024 0300 Gross per 24 hour  Intake 66.12 ml  Output 600 ml  Net -533.88 ml   Wt Readings from Last 3 Encounters:  05/31/24 82 kg  07/19/22 82 kg  05/10/22 82 kg    Examination:  Constitutional: NAD Eyes: lids and conjunctivae normal, no scleral icterus ENMT: mmm Neck: normal, supple Respiratory: clear to auscultation bilaterally, no wheezing, no crackles. Normal respiratory effort.  Cardiovascular: Regular rate and rhythm, no murmurs / rubs / gallops. No LE edema. Abdomen: soft, no distention, no tenderness. Bowel sounds positive.     Data Reviewed: I have independently reviewed following labs and imaging studies  CBC Recent Labs  Lab 05/31/24 0720 06/01/24 0512 06/02/24 0804  WBC 12.2* 15.8* 17.2*  HGB 13.2 10.6* 10.0*  HCT 39.9 31.5* 30.2*  PLT 249 244 217  MCV 91.1 91.0 91.8  MCH 30.1 30.6 30.4  MCHC 33.1 33.7 33.1  RDW 12.6 12.5 12.7  LYMPHSABS 1.2  --   --   MONOABS 0.8  --   --   EOSABS 0.3  --   --   BASOSABS 0.1  --   --     Recent Labs  Lab 05/31/24 0720 06/01/24 0512 06/02/24 0012 06/02/24 0804  NA 138 135  --  137  K 4.1 4.1  --  3.9  CL 106 105  --  104  CO2 23 23  --  22  GLUCOSE 114* 123*  --  120*  BUN 12 15  --  23  CREATININE 1.14* 1.26*  --  1.37*  CALCIUM  9.2  8.7*  --  8.9  MG  --   --   --  1.6*  AMMONIA  --   --  25  --     ------------------------------------------------------------------------------------------------------------------ No results for input(s): CHOL, HDL, LDLCALC, TRIG, CHOLHDL, LDLDIRECT in the last 72 hours.  Lab Results  Component Value Date   HGBA1C 5.6 11/25/2018   ------------------------------------------------------------------------------------------------------------------ No results for input(s): TSH, T4TOTAL, T3FREE, THYROIDAB in the last 72 hours.  Invalid input(s): FREET3  Cardiac Enzymes No results for input(s): CKMB, TROPONINI, MYOGLOBIN in the last 168 hours.  Invalid input(s): CK ------------------------------------------------------------------------------------------------------------------ No results found for: BNP  CBG: Recent Labs  Lab 05/31/24 1059 06/01/24 0737 06/01/24 1121 06/01/24 1514 06/01/24 2353  GLUCAP 130* 126* 123* 145* 126*    Recent Results (from the past 240 hours)  Surgical pcr screen     Status: Abnormal   Collection Time: 05/31/24 10:40 AM   Specimen: Nasal Mucosa; Nasal Swab  Result Value Ref Range Status   MRSA, PCR NEGATIVE NEGATIVE Final    Staphylococcus aureus POSITIVE (A) NEGATIVE Final    Comment: (NOTE) The Xpert SA Assay (FDA approved for NASAL specimens in patients 37 years of age and older), is one component of a comprehensive surveillance program. It is not intended to diagnose infection nor to guide or monitor treatment. Performed at [facility], [address] [place], [place] [address]      Radiology Studies: No results found.    Nilda Fendt, MD, PhD [facility]  Between 7 am - 7 pm I am available, please contact me via Amion (for emergencies) or Securechat (non urgent messages)  Between 7 pm - 7 am I am not available, please contact night coverage MD/APP via Amion

## 2024-06-02 NOTE — Plan of Care (Signed)
  Problem: Education: Goal: Knowledge of General Education information will improve Description: Including pain rating scale, medication(s)/side effects and non-pharmacologic comfort measures Outcome: Not Progressing   Problem: Health Behavior/Discharge Planning: Goal: Ability to manage health-related needs will improve Outcome: Not Progressing   Problem: Activity: Goal: Risk for activity intolerance will decrease Outcome: Progressing   Problem: Nutrition: Goal: Adequate nutrition will be maintained Outcome: Progressing   Problem: Pain Managment: Goal: General experience of comfort will improve and/or be controlled Outcome: Progressing   Problem: Safety: Goal: Ability to remain free from injury will improve Outcome: Progressing

## 2024-06-02 NOTE — Progress Notes (Signed)
 Physical Therapy Treatment Patient Details Name: Deanna Acosta MRN: [id number] DOB: April 19, 1936 Today's Date: 06/02/2024   History of Present Illness Deanna ANGELL is a 88 y.o. female admitted 05/31/24 after fall sustaining a severely communicated and displaced left acetabular fracture and a minimally displaced left pubic ramus fracture. Pt s/p ORIF 8/26. PMHx: HTN, HLD, COPD, CKD, GERD, anxiety, depression, pre-diabetes, and osteoporosis.    PT Comments  Pt greeted supine in bed, pleasant and agreeable to PT session. She required slightly less physical assist during bed mobility and transfers. Re-educated pt on LLE weight-bearing status. She had difficulty maintaining LLE TTWB during transfers. PT assisted by facilitating increased weight shift to RLE, supporting underneath L foot to gauge amount of weight pt was placing in foot, and providing moderate-max cues for increased safety awareness. She advanced OOB mobility completing bed>chair transfer with use of stedy. Will continue to follow acutely and advance appropriately.      If plan is discharge home, recommend the following: Two people to help with walking and/or transfers;Two people to help with bathing/dressing/bathroom;Assist for transportation;Help with stairs or ramp for entrance   Can travel by private vehicle     No  Equipment Recommendations  Hospital bed;Hoyer lift;Wheelchair (measurements PT)    Recommendations for Other Services       Precautions / Restrictions Precautions Precautions: Fall Recall of Precautions/Restrictions: Impaired Precaution/Restrictions Comments: watch SpO2; Pt required moderate cues and assist to abide by [facility] precautions during mobility Restrictions Weight Bearing Restrictions Per Provider Order: Yes LLE Weight Bearing Per Provider Order: Touchdown weight bearing Other Position/Activity Restrictions: No motion restrictions     Mobility  Bed Mobility Overal bed mobility: Needs  Assistance Bed Mobility: Supine to Sit     Supine to sit: Mod assist, +2 for physical assistance, +2 for safety/equipment     General bed mobility comments: Pt sat up on R side of bed with increased time. Cues for sequencing. Assist to manage BLE and trunk with use of bed pad to pivot, elevate trunk, and scoot fwd.    Transfers Overall transfer level: Needs assistance Equipment used: Rolling walker (2 wheels), Ambulation equipment used Transfers: Sit to/from Stand, Bed to chair/wheelchair/BSC Sit to Stand: From elevated surface, Mod assist, +2 physical assistance, +2 safety/equipment           General transfer comment: Pt stood from raised bed height.  Cued proper hand placement using RW. Powered up with modA x2. Used bed pad to facilitate full hip extension. Cues to limit WBing on LLE. Pt had difficulty abiding by this. Placed pt's L foot on top of PT's foot to aid in regulate weight. Educated pt to act as if a egg is underneath her foot and to only allow weight into toes. Cued pt to increase WBing into BUE support on RW. She maintained a slight fwd lean. Pt fatigued and returned to bed. Introduced stedy and educated pt on proper sequencing. She powered up with modA x2 to stand and minA x2 from the stedy seat. Good eccentric control with sitting. Transfer via Lift Equipment: Stedy  Ambulation/Gait               General Gait Details: Theme park manager Bed    Modified Rankin (Stroke Patients Only)       Balance Overall balance assessment: Needs assistance Sitting-balance support: Bilateral upper extremity supported, Single extremity supported,  No upper extremity supported, Feet unsupported Sitting balance-Leahy Scale: Fair Sitting balance - Comments: Pt sat EOB with supervision. She was able to achieve neutral posture. Intermittent right lateral lean d/t pain. Postural control: Right lateral lean Standing balance support:  Bilateral upper extremity supported, During functional activity, Reliant on assistive device for balance Standing balance-Leahy Scale: Poor Standing balance comment: Pt dependent on RW and +2 assist and then stedy.                            Communication Communication Communication: Impaired Factors Affecting Communication: Hearing impaired (significantly HOH)  Cognition Arousal: Alert Behavior During Therapy: WFL for tasks assessed/performed   PT - Cognitive impairments: Awareness, Sequencing, Problem solving, Safety/Judgement                       PT - Cognition Comments: Pt confused, in hospital-acquired delirium. She requires increased time for processing; suspect due to pt being [facility]. Following commands: Intact      Cueing Cueing Techniques: Verbal cues, Gestural cues, Tactile cues, Visual cues  Exercises      General Comments General comments (skin integrity, edema, etc.): Pt on 3L O2 via . SpO2 94-97% throughout session.      Pertinent Vitals/Pain Pain Assessment Pain Assessment: Faces Faces Pain Scale: Hurts even more Pain Location: L shoulder, L back, and L hip Pain Descriptors / Indicators: Discomfort, Grimacing, Operative site guarding, Guarding, Aching Pain Intervention(s): Monitored during session, Limited activity within patient's tolerance, Patient requesting pain meds-RN notified, Premedicated before session, Repositioned    Home Living                          Prior Function            PT Goals (current goals can now be found in the care plan section) Acute Rehab PT Goals Patient Stated Goal: Get better. Have less pain Progress towards PT goals: Progressing toward goals    Frequency    Min 2X/week      PT Plan      Co-evaluation              AM-PAC PT 6 Clicks Mobility   Outcome Measure  Help needed turning from your back to your side while in a flat bed without using bedrails?: Total Help needed  moving from lying on your back to sitting on the side of a flat bed without using bedrails?: Total Help needed moving to and from a bed to a chair (including a wheelchair)?: Total Help needed standing up from a chair using your arms (e.g., wheelchair or bedside chair)?: Total Help needed to walk in hospital room?: Total Help needed climbing 3-5 steps with a railing? : Total 6 Click Score: 6    End of Session Equipment Utilized During Treatment: Gait belt;Oxygen  Activity Tolerance: Patient tolerated treatment well;Patient limited by pain;Patient limited by fatigue Patient left: in chair;with call bell/phone within reach;with chair alarm set;with family/visitor present Nurse Communication: Mobility status;Need for lift equipment;Patient requests pain meds;Weight bearing status (stedy; pt difficulty abiding by TTWB on LLE) PT Visit Diagnosis: Pain;Difficulty in walking, not elsewhere classified (R26.2);Unsteadiness on feet (R26.81);Other abnormalities of gait and mobility (R26.89) Pain - Right/Left: Left Pain - part of body: Shoulder;Hip     Time: 8565-8540 PT Time Calculation (min) (ACUTE ONLY): 25 min  Charges:    $Therapeutic Activity: 23-37 mins PT General Charges $$  ACUTE PT VISIT: 1 Visit                     Randall SAUNDERS, PT, DPT Acute Rehabilitation Services Office: [phone] Secure Chat Preferred  Delon CHRISTELLA Callander 06/02/2024, 3:43 PM
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
## 2024-06-02 NOTE — Progress Notes (Signed)
 Patient refused all bedtime medications, became increasingly agitated and combative towards nursing staff, pulling at telemetry and lines, not easily redirectable. Patient was alert and oriented to self, per husband this is a new onset. Notified on call provider of agitation and new onset confusion. Fall precautions in place.

## 2024-06-02 NOTE — NC FL2 (Signed)
 [place]  MEDICAID FL2 LEVEL OF CARE FORM     IDENTIFICATION  Patient Name: Deanna Acosta Birthdate: Apr 15, 1936 Sex: female Admission Date (Current Location): 05/31/2024  [facility] and [place] Number:  Producer, television/film/video and Address:  [facility], [address], [place], [place] [address]      Provider Number: [id number]  Attending Physician Name and Address:  Trixie Nilda HERO, MD  Relative Name and Phone Number:  Juley, Giovanetti [phone]  [phone]    Current Level of Care: Hospital Recommended Level of Care: Skilled Nursing Facility Prior Approval Number:    Date Approved/Denied:   PASRR Number: [id number]  Discharge Plan: SNF    Current Diagnoses: Patient Active Problem List   Diagnosis Date Noted   Malnutrition of moderate degree 06/02/2024   Left acetabular fracture (HCC) 05/31/2024   Closed fracture of left inferior pubic ramus (HCC) 05/31/2024   Fall at home, initial encounter 05/31/2024   Leukocytosis 05/31/2024   Hyperlipidemia 05/31/2024   GERD (gastroesophageal reflux disease) 05/31/2024   COVID-19 virus infection 12/23/2021   Acute respiratory failure with hypoxia (HCC) 12/22/2021   AKI (acute kidney injury) (HCC) 01/22/2021   S/P lumbar laminectomy 11/29/2018   Dysphagia    Gastroesophageal reflux disease with esophagitis    Foreign body in esophagus    Esophageal dysmotility 12/13/2017   Aspiration pneumonia due to vomit (HCC) 12/13/2017   Status post total knee replacement, right 12/11/2017   Varicose veins of bilateral lower extremities with other complications 12/08/2016   Influenza A 10/21/2016   COPD (chronic obstructive pulmonary disease) (HCC) 09/26/2016   Headache 09/24/2012   COPD exacerbation (HCC) 09/23/2012   Hypoxemia 09/23/2012   Dehydration 09/23/2012   HTN (hypertension) 09/23/2012   Chronic kidney disease, stage III (moderate) (HCC) 09/23/2012    Orientation RESPIRATION BLADDER  Height & Weight     Self  O2   Weight: 180 lb 12.4 oz (82 kg) Height:  5' 4 (162.6 cm)  BEHAVIORAL SYMPTOMS/MOOD NEUROLOGICAL BOWEL NUTRITION STATUS        Diet (see discharge summary)  AMBULATORY STATUS COMMUNICATION OF NEEDS Skin   Total Care Verbally Other (Comment) (ecchymosis, redness)                       Personal Care Assistance Level of Assistance  Bathing, Feeding, Dressing Bathing Assistance: Maximum assistance Feeding assistance: Limited assistance Dressing Assistance: Maximum assistance     Functional Limitations Info  Sight, Hearing, Speech Sight Info: Adequate Hearing Info: Adequate Speech Info: Adequate    SPECIAL CARE FACTORS FREQUENCY  PT (By licensed PT), OT (By licensed OT)     PT Frequency: 5x week OT Frequency: 5x week            Contractures Contractures Info: Not present    Additional Factors Info  Code Status, Allergies Code Status Info: full Allergies Info: Cortisone, Penicillins, Losartan , Other, Tape, Levofloxacin            Current Medications (06/02/2024):  This is the current hospital active medication list Current Facility-Administered Medications  Medication Dose Route Frequency Provider Last Rate Last Admin   0.9 % NaCl with KCl 20 mEq/ L  infusion   Intravenous Continuous Deward Eck, PA-C 10 mL/hr at 05/31/24 1821 New Bag at 05/31/24 1821   acetaminophen  (TYLENOL ) tablet 1,000 mg  1,000 mg Oral Q8H Deward Eck, PA-C   1,000 mg at 06/02/24 0545   aspirin  tablet 325 mg  325 mg  Oral Daily Claudene Reeves A, MD   325 mg at 06/02/24 1016   diltiazem  (CARDIZEM  CD) 24 hr capsule 120 mg  120 mg Oral Daily Claudene, Rondell A, MD   120 mg at 06/02/24 1015   diphenhydrAMINE  (BENADRYL ) tablet 25 mg  25 mg Oral Q6H PRN Smith, Rondell A, MD       diphenoxylate -atropine  (LOMOTIL ) 2.5-0.025 MG per tablet 1-2 tablet  1-2 tablet Oral QID PRN Claudene Reeves LABOR, MD       docusate sodium  (COLACE) capsule 100 mg  100 mg Oral BID Deward Eck, PA-C    100 mg at 06/02/24 1015   enoxaparin  (LOVENOX ) injection 40 mg  40 mg Subcutaneous Q24H Deward Eck, PA-C   40 mg at 06/02/24 1014   FLUoxetine  (PROZAC ) capsule 40 mg  40 mg Oral QAC breakfast Claudene Reeves A, MD   40 mg at 06/02/24 1015   folic acid  (FOLVITE ) tablet 1 mg  1 mg Oral Daily Chavez, Abigail, NP   1 mg at 06/02/24 1015   hydrochlorothiazide  (HYDRODIURIL ) tablet 25 mg  25 mg Oral Daily Smith, Rondell A, MD   25 mg at 06/02/24 1015   ipratropium-albuterol  (DUONEB) 0.5-2.5 (3) MG/3ML nebulizer solution 3 mL  3 mL Nebulization Q4H PRN Smith, Rondell A, MD       lidocaine  (LIDODERM ) 5 % 1 patch  1 patch Transdermal Q24H Deward Eck, PA-C   1 patch at 06/02/24 1027   melatonin tablet 5 mg  5 mg Oral QHS PRN Chavez, Abigail, NP       metoCLOPramide  (REGLAN ) tablet 5-10 mg  5-10 mg Oral Q8H PRN Deward Eck, PA-C       Or   metoCLOPramide  (REGLAN ) injection 5-10 mg  5-10 mg Intravenous Q8H PRN Deward Eck, PA-C       metoprolol  succinate (TOPROL -XL) 24 hr tablet 100 mg  100 mg Oral Daily Claudene, Rondell A, MD   100 mg at 06/02/24 1015   ondansetron  (ZOFRAN ) tablet 4 mg  4 mg Oral Q6H PRN Deward Eck, PA-C       Or   ondansetron  (ZOFRAN ) injection 4 mg  4 mg Intravenous Q6H PRN Deward Eck, PA-C       pantoprazole  (PROTONIX ) EC tablet 40 mg  40 mg Oral Daily Smith, Rondell A, MD   40 mg at 06/02/24 1015   rosuvastatin  (CRESTOR ) tablet 5 mg  5 mg Oral Daily Smith, Rondell A, MD   5 mg at 06/02/24 1015   senna-docusate (Senokot-S) tablet 1 tablet  1 tablet Oral BID Claudene Reeves A, MD   1 tablet at 06/02/24 1015   thiamine  (VITAMIN B1) tablet 100 mg  100 mg Oral Daily Chavez, Abigail, NP   100 mg at 06/02/24 1015     Discharge Medications: Please see discharge summary for a list of discharge medications.  Relevant Imaging Results:  Relevant Lab Results:   Additional Information SSN: [id number]  Bridget Cordella Simmonds, LCSW

## 2024-06-03 ENCOUNTER — Other Ambulatory Visit: Payer: Self-pay

## 2024-06-03 DIAGNOSIS — S32402A Unspecified fracture of left acetabulum, initial encounter for closed fracture: Secondary | ICD-10-CM | POA: Diagnosis not present

## 2024-06-03 LAB — COMPREHENSIVE METABOLIC PANEL WITH GFR
ALT: 10 U/L (ref 0–44)
AST: 14 U/L — ABNORMAL LOW (ref 15–41)
Albumin: 2.3 g/dL — ABNORMAL LOW (ref 3.5–5.0)
Alkaline Phosphatase: 69 U/L (ref 38–126)
Anion gap: 11 (ref 5–15)
BUN: 28 mg/dL — ABNORMAL HIGH (ref 8–23)
CO2: 23 mmol/L (ref 22–32)
Calcium: 9 mg/dL (ref 8.9–10.3)
Chloride: 103 mmol/L (ref 98–111)
Creatinine, Ser: 1.57 mg/dL — ABNORMAL HIGH (ref 0.44–1.00)
GFR, Estimated: 32 mL/min — ABNORMAL LOW (ref >60)
Glucose, Bld: 117 mg/dL — ABNORMAL HIGH (ref 70–99)
Potassium: 3.8 mmol/L (ref 3.5–5.1)
Sodium: 137 mmol/L (ref 135–145)
Total Bilirubin: 0.9 mg/dL (ref 0.0–1.2)
Total Protein: 5.2 g/dL — ABNORMAL LOW (ref 6.5–8.1)

## 2024-06-03 LAB — CBC
HCT: 30.3 % — ABNORMAL LOW (ref 36.0–46.0)
Hemoglobin: 9.8 g/dL — ABNORMAL LOW (ref 12.0–15.0)
MCH: 30.3 pg (ref 26.0–34.0)
MCHC: 32.3 g/dL (ref 30.0–36.0)
MCV: 93.8 fL (ref 80.0–100.0)
Platelets: 206 K/uL (ref 150–400)
RBC: 3.23 MIL/uL — ABNORMAL LOW (ref 3.87–5.11)
RDW: 12.7 % (ref 11.5–15.5)
WBC: 14.7 K/uL — ABNORMAL HIGH (ref 4.0–10.5)
nRBC: 0 % (ref 0.0–0.2)

## 2024-06-03 LAB — MAGNESIUM: Magnesium: 2.3 mg/dL (ref 1.7–2.4)

## 2024-06-03 MED ORDER — SODIUM CHLORIDE 0.9 % IV BOLUS
500.0000 mL | Freq: Once | INTRAVENOUS | Status: AC
  Administered 2024-06-03: 500 mL via INTRAVENOUS

## 2024-06-03 MED ORDER — ENOXAPARIN SODIUM 40 MG/0.4ML IJ SOSY: 40.0000 mg | PREFILLED_SYRINGE | INTRAMUSCULAR | 0 refills | Status: DC

## 2024-06-03 MED ORDER — ACETAMINOPHEN 500 MG PO TABS: 1000.0000 mg | ORAL_TABLET | Freq: Four times a day (QID) | ORAL | 0 refills | Status: AC | PRN

## 2024-06-03 MED ORDER — ORAL CARE MOUTH RINSE
15.0000 mL | OROMUCOSAL | Status: DC | PRN
Start: 2024-06-03 — End: 2024-06-04

## 2024-06-03 MED ORDER — PNEUMOCOCCAL 20-VAL CONJ VACC 0.5 ML IM SUSY
0.5000 mL | PREFILLED_SYRINGE | INTRAMUSCULAR | Status: AC
  Administered 2024-06-03: 0.5 mL via INTRAMUSCULAR
  Filled 2024-06-03: qty 0.5

## 2024-06-03 MED ORDER — ENOXAPARIN SODIUM 30 MG/0.3ML IJ SOSY
30.0000 mg | PREFILLED_SYRINGE | INTRAMUSCULAR | Status: DC
  Administered 2024-06-04: 30 mg via SUBCUTANEOUS
  Filled 2024-06-03: qty 0.3

## 2024-06-03 NOTE — Care Management Important Message (Signed)
 Important Message  Patient Details  Name: Deanna Acosta MRN: [id number] Date of Birth: [birth year]   Important Message Given:  Yes - Medicare IM     Jon Cruel 06/03/2024, 4:34 PM

## 2024-06-03 NOTE — TOC Progression Note (Addendum)
 Transition of Care [facility]) - Progression Note    Patient Details  Name: TEJASVI BRISSETT MRN: [id number] Date of Birth: 08-08-1936  [facility] CM/SW Contact  Bridget Cordella Simmonds, LCSW Phone Number: 06/03/2024, 11:08 AM  Clinical Narrative:  CSW spoke with pt, husband, daughter.  They are going to tour [facility] at 3pm today.   1515: CSW received call from daughter Eward.  They completed tour, do want to accept offer at [facility].  CSW confirmed with [facility]any/[facility] that they can receive pt tomorrow with approved auth.  Auth request submitted in [place] and approved: [id number], 4 days: 8/30-9/2.  Expected Discharge Plan: Skilled Nursing Facility Barriers to Discharge: Continued Medical Work up, SNF Pending bed offer               Expected Discharge Plan and Services In-house Referral: Clinical Social Work   Post Acute Care Choice: Skilled Nursing Facility Living arrangements for the past 2 months: Single Family Home                                       Social Drivers of Health (SDOH) Interventions SDOH Screenings   Food Insecurity: No Food Insecurity (06/01/2024)  Housing: Low Risk  (06/01/2024)  Transportation Needs: No Transportation Needs (06/02/2024)  Utilities: Not At Risk (06/02/2024)  Social Connections: Moderately Integrated (06/02/2024)  Tobacco Use: Medium Risk (05/31/2024)    Readmission Risk Interventions     No data to display

## 2024-06-03 NOTE — Discharge Instructions (Addendum)
 Orthopaedic Trauma Service Discharge Instructions   General Discharge Instructions  Orthopaedic Injuries:  Left acetabulum fracture treated with open reduction internal fixation using plate and screws  WEIGHT BEARING STATUS: Touchdown weightbearing left leg using walker for assistance  RANGE OF MOTION/ACTIVITY: No motion restrictions left hip.  Activity as tolerated while maintaining weightbearing restrictions.  Okay to ride recumbent exercise bike if available at nursing facility  Bone health: Vitamin D  levels look good however mechanism of injury is suggestive of osteoporosis  Review the following resource for additional information regarding bone health  [email or web address]  Wound Care: Daily wound care as needed.  Okay to clean wounds with soap and water.  Okay to keep wound covered even if it is dry  DVT/PE prophylaxis: Lovenox  40 mg subcutaneous injection daily x 21 days  Diet: as you were eating previously.  Can use over the counter stool softeners and bowel preparations, such as Miralax , to help with bowel movements.  Narcotics can be constipating.  Be sure to drink plenty of fluids  PAIN MEDICATION USE AND EXPECTATIONS  You have likely been given narcotic medications to help control your pain.  After a traumatic event that results in an fracture (broken bone) with or without surgery, it is ok to use narcotic pain medications to help control one's pain.  We understand that everyone responds to pain differently and each individual patient will be evaluated on a regular basis for the continued need for narcotic medications. Ideally, narcotic medication use should last no more than 6-8 weeks (coinciding with fracture healing).   As a patient it is your responsibility as well to monitor narcotic medication use and report the amount and frequency you use these medications when you come to your office visit.   We would also advise that if you are using narcotic  medications, you should take a dose prior to therapy to maximize you participation.  IF YOU ARE ON NARCOTIC MEDICATIONS IT IS NOT PERMISSIBLE TO OPERATE A MOTOR VEHICLE (MOTORCYCLE/CAR/TRUCK/MOPED) OR HEAVY MACHINERY DO NOT MIX NARCOTICS WITH OTHER CNS (CENTRAL NERVOUS SYSTEM) DEPRESSANTS SUCH AS ALCOHOL   POST-OPERATIVE OPIOID TAPER INSTRUCTIONS: It is important to wean off of your opioid medication as soon as possible. If you do not need pain medication after your surgery it is ok to stop day one. Opioids include: Codeine, Hydrocodone (Norco, Vicodin), Oxycodone (Percocet, oxycontin ) and hydromorphone  amongst others.  Long term and even short term use of opiods can cause: Increased pain response Dependence Constipation Depression Respiratory depression And more.  Withdrawal symptoms can include Flu like symptoms Nausea, vomiting And more Techniques to manage these symptoms Hydrate well Eat regular healthy meals Stay active Use relaxation techniques(deep breathing, meditating, yoga) Do Not substitute Alcohol to help with tapering If you have been on opioids for less than two weeks and do not have pain than it is ok to stop all together.  Plan to wean off of opioids This plan should start within one week post op of your fracture surgery  Maintain the same interval or time between taking each dose and first decrease the dose.  Cut the total daily intake of opioids by one tablet each day Next start to increase the time between doses. The last dose that should be eliminated is the evening dose.    STOP SMOKING OR USING NICOTINE PRODUCTS!!!!  As discussed nicotine severely impairs your body's ability to heal surgical and traumatic wounds but also impairs bone healing.  Wounds and bone heal by forming microscopic blood vessels (  angiogenesis) and nicotine is a vasoconstrictor (essentially, shrinks blood vessels).  Therefore, if vasoconstriction occurs to these microscopic blood vessels  they essentially disappear and are unable to deliver necessary nutrients to the healing tissue.  This is one modifiable factor that you can do to dramatically increase your chances of healing your injury.    (This means no smoking, no nicotine gum, patches, etc)  DO NOT USE NONSTEROIDAL ANTI-INFLAMMATORY DRUGS (NSAID'S)  Using products such as Advil  (ibuprofen ), Aleve (naproxen), Motrin  (ibuprofen ) for additional pain control during fracture healing can delay and/or prevent the healing response.  If you would like to take over the counter (OTC) medication, Tylenol  (acetaminophen ) is ok.  However, some narcotic medications that are given for pain control contain acetaminophen  as well. Therefore, you should not exceed more than 4000 mg of tylenol  in a day if you do not have liver disease.  Also note that there are may OTC medicines, such as cold medicines and allergy medicines that my contain tylenol  as well.  If you have any questions about medications and/or interactions please ask your doctor/PA or your pharmacist.      ICE AND ELEVATE INJURED/OPERATIVE EXTREMITY  Using ice and elevating the injured extremity above your heart can help with swelling and pain control.  Icing in a pulsatile fashion, such as 20 minutes on and 20 minutes off, can be followed.    Do not place ice directly on skin. Make sure there is a barrier between to skin and the ice pack.    Using frozen items such as frozen peas works well as the conform nicely to the are that needs to be iced.  USE AN ACE WRAP OR TED HOSE FOR SWELLING CONTROL  In addition to icing and elevation, Ace wraps or TED hose are used to help limit and resolve swelling.  It is recommended to use Ace wraps or TED hose until you are informed to stop.    When using Ace Wraps start the wrapping distally (farthest away from the body) and wrap proximally (closer to the body)   Example: If you had surgery on your leg and you do not have a splint on, start the ace  wrap at the toes and work your way up to the thigh        If you had surgery on your upper extremity and do not have a splint on, start the ace wrap at your fingers and work your way up to the upper arm  IF YOU ARE IN A SPLINT OR CAST DO NOT REMOVE IT FOR ANY REASON   If your splint gets wet for any reason please contact the office immediately. You may shower in your splint or cast as long as you keep it dry.  This can be done by wrapping in a cast cover or garbage back (or similar)  Do Not stick any thing down your splint or cast such as pencils, money, or hangers to try and scratch yourself with.  If you feel itchy take benadryl  as prescribed on the bottle for itching  IF YOU ARE IN A CAM BOOT (BLACK BOOT)  You may remove boot periodically. Perform daily dressing changes as noted below.  Wash the liner of the boot regularly and wear a sock when wearing the boot. It is recommended that you sleep in the boot until told otherwise    Call office for the following: Temperature greater than 101F Persistent nausea and vomiting Severe uncontrolled pain Redness, tenderness, or signs of  infection (pain, swelling, redness, odor or green/yellow discharge around the site) Difficulty breathing, headache or visual disturbances Hives Persistent dizziness or light-headedness Extreme fatigue Any other questions or concerns you may have after discharge  In an emergency, call 911 or go to an Emergency Department at a nearby hospital  HELPFUL INFORMATION  If you had a block, it will wear off between 8-24 hrs postop typically.  This is period when your pain may go from nearly zero to the pain you would have had postop without the block.  This is an abrupt transition but nothing dangerous is happening.  You may take an extra dose of narcotic when this happens.  You should wean off your narcotic medicines as soon as you are able.  Most patients will be off or using minimal narcotics before their first postop  appointment.   We suggest you use the pain medication the first night prior to going to bed, in order to ease any pain when the anesthesia wears off. You should avoid taking pain medications on an empty stomach as it will make you nauseous.  Do not drink alcoholic beverages or take illicit drugs when taking pain medications.  In most states it is against the law to drive while you are in a splint or sling.  And certainly against the law to drive while taking narcotics.  You may return to work/school in the next couple of days when you feel up to it.   Pain medication may make you constipated.  Below are a few solutions to try in this order: Decrease the amount of pain medication if you aren't having pain. Drink lots of decaffeinated fluids. Drink prune juice and/or each dried prunes  If the first 3 don't work start with additional solutions Take Colace - an over-the-counter stool softener Take Senokot - an over-the-counter laxative Take Miralax  - a stronger over-the-counter laxative     CALL THE OFFICE WITH ANY QUESTIONS OR CONCERNS: [phone]   VISIT OUR WEBSITE FOR ADDITIONAL INFORMATION: [email or web address]

## 2024-06-03 NOTE — Progress Notes (Signed)
 PROGRESS NOTE  Deanna Acosta FMW:[id number] DOB: 09/25/1936 DOA: 05/31/2024 PCP: Cleotilde Planas, MD   LOS: 3 days   Brief Narrative / Interim history: 88 y.o. female with medical history significant of hypertension, hyperlipidemia, COPD, osteoporosis presents after having a fall at home.  She was found to have a left acetabular fracture status post operative repair.  Subjective / 24h Interval events: Fusion gradually improving.  Family is at bedside  Assesement and Plan: Principal Problem:   Left acetabular fracture [facility]) Active Problems:   Closed fracture of left inferior pubic ramus (HCC)   Fall at home, initial encounter   Leukocytosis   COPD (chronic obstructive pulmonary disease) (HCC)   HTN (hypertension)   Hyperlipidemia   Chronic kidney disease, stage III (moderate) (HCC)   GERD (gastroesophageal reflux disease)   Malnutrition of moderate degree  Principal problem Left acetabular fracture, left inferior pubic ramus fracture-orthopedic surgery consulted, she was taken to the OR for repair on 8/26.  - PT/OT recommending SNF, discussed with TOC, placement pending  Active problems Left shoulder pain-imaging without acute fractures but possible calcific tendinopathy.  Symptomatic management  In hospital-acquired delirium-more delirious overnight 8/28, there is no history of dementia.  Suspect multifactorial in the setting of being hospitalized with an acute illness, general anesthesia as well as use of oxycodone .  Discontinue narcotics - delirium precautions  Leukocytosis-likely due to 1.  Improving now  Borderline low B12-replenished  Hypomagnesemia-magnesium  normalized  COPD-no wheezing, this is stable  Hypertension-continue home medications, blood pressure acceptable  Hyperlipidemia-continue statin  CKD 3A-creatinine likely up today, she has been having poor p.o. intake, give limited IV fluids  Chronic back pain-continue pain control, DC oxycodone  due to  delirium  Obesity, class I-BMI 31  Scheduled Meds:  acetaminophen   1,000 mg Oral Q8H   aspirin   325 mg Oral Daily   diltiazem   120 mg Oral Daily   docusate sodium   100 mg Oral BID   enoxaparin  (LOVENOX ) injection  40 mg Subcutaneous Q24H   FLUoxetine   40 mg Oral QAC breakfast   folic acid   1 mg Oral Daily   hydrochlorothiazide   25 mg Oral Daily   lidocaine   1 patch Transdermal Q24H   metoprolol  succinate  100 mg Oral Daily   pantoprazole   40 mg Oral Daily   pneumococcal 20-valent conjugate vaccine  0.5 mL Intramuscular Tomorrow-1000   rosuvastatin   5 mg Oral Daily   senna-docusate  1 tablet Oral BID   thiamine   100 mg Oral Daily   Continuous Infusions:  0.9 % NaCl with KCl 20 mEq / L 10 mL/hr at 05/31/24 1821   sodium chloride      PRN Meds:.diphenhydrAMINE , diphenoxylate -atropine , ipratropium-albuterol , ketorolac , melatonin, metoCLOPramide  **OR** metoCLOPramide  (REGLAN ) injection, ondansetron  **OR** ondansetron  (ZOFRAN ) IV, mouth rinse  Current Outpatient Medications  Medication Instructions   acetaminophen  (TYLENOL ) 1,000 mg, Every 6 hours PRN   ADVAIR HFA 115-21 MCG/ACT inhaler 2 puffs, 2 times daily   albuterol  (PROVENTIL  HFA;VENTOLIN  HFA) 108 (90 Base) MCG/ACT inhaler 2 puffs, Every 6 hours PRN   albuterol  (PROVENTIL ) 2.5 mg, Nebulization, Every 4 hours PRN   aspirin  325 mg, Daily   chlorhexidine  (HIBICLENS ) 4 % external liquid 1 Application, Topical, As directed, Use as directed daily for 5 days every other week for 6 weeks.   cholecalciferol  (VITAMIN D3) 1,000 Units, Daily   diltiazem  (CARDIZEM  CD) 120 mg, Daily   diphenhydrAMINE  (BENADRYL ) 25 mg, Oral, Every 6 hours   diphenoxylate -atropine  (LOMOTIL ) 2.5-0.025 MG tablet 1-2 tablets, Oral, 4  times daily PRN   ferrous sulfate  325 mg, Daily with breakfast   FLUoxetine  (PROZAC ) 40 mg, Daily before breakfast   hydrochlorothiazide  (HYDRODIURIL ) 25 mg, Daily   metoprolol  succinate (TOPROL -XL) 100 mg, Daily   mupirocin   ointment (BACTROBAN ) 2 % 1 Application, Nasal, 2 times daily, Use as directed 2 times daily for 5 days every other week for 6 weeks.   omeprazole (PRILOSEC) 40 mg, Daily   potassium chloride  SA (KLOR-CON  M) 20 MEQ tablet 20 mEq, Oral, 2 times daily   rosuvastatin  (CRESTOR ) 5 mg, Daily    Diet Orders (From admission, onward)     Start     Ordered   05/31/24 1721  Diet regular Room service appropriate? Yes; Fluid consistency: Thin  Diet effective now       Question Answer Comment  Room service appropriate? Yes   Fluid consistency: Thin      05/31/24 1720            DVT prophylaxis: enoxaparin  (LOVENOX ) injection 40 mg Start: 06/01/24 0800 SCDs Start: 05/31/24 1721   Lab Results  Component Value Date   PLT 206 06/03/2024      Code Status: Full Code  Family Communication: Family at bedside  Status is: Inpatient Remains inpatient appropriate because: severity of illness  Level of care: Telemetry Surgical  Consultants:  Orthopedic surgery   Objective: Vitals:   06/02/24 1911 06/03/24 0314 06/03/24 0500 06/03/24 0743  BP: (!) 122/38 (!) 108/43  (!) 124/51  Pulse: 65 (!) 58  62  Resp: 18 20  15   Temp: 97.7 F (36.5 C) 98 F (36.7 C)  (!) 97.4 F (36.3 C)  TempSrc:  Oral  Oral  SpO2: 97% 98%  98%  Weight:   85 kg   Height:        Intake/Output Summary (Last 24 hours) at 06/03/2024 1001 Last data filed at 06/02/2024 1600 Gross per 24 hour  Intake 0 ml  Output --  Net 0 ml   Wt Readings from Last 3 Encounters:  06/03/24 85 kg  07/19/22 82 kg  05/10/22 82 kg    Examination:  Constitutional: NAD Eyes: lids and conjunctivae normal, no scleral icterus ENMT: mmm Neck: normal, supple Respiratory: clear to auscultation bilaterally, no wheezing, no crackles.  Cardiovascular: Regular rate and rhythm, no murmurs / rubs / gallops.  Abdomen: soft, no distention, no tenderness. Bowel sounds positive.    Data Reviewed: I have independently reviewed following  labs and imaging studies  CBC Recent Labs  Lab 05/31/24 0720 06/01/24 0512 06/02/24 0804 06/03/24 0537  WBC 12.2* 15.8* 17.2* 14.7*  HGB 13.2 10.6* 10.0* 9.8*  HCT 39.9 31.5* 30.2* 30.3*  PLT 249 244 217 206  MCV 91.1 91.0 91.8 93.8  MCH 30.1 30.6 30.4 30.3  MCHC 33.1 33.7 33.1 32.3  RDW 12.6 12.5 12.7 12.7  LYMPHSABS 1.2  --   --   --   MONOABS 0.8  --   --   --   EOSABS 0.3  --   --   --   BASOSABS 0.1  --   --   --     Recent Labs  Lab 05/31/24 0720 06/01/24 0512 06/02/24 0012 06/02/24 0804 06/03/24 0537  NA 138 135  --  137 137  K 4.1 4.1  --  3.9 3.8  CL 106 105  --  104 103  CO2 23 23  --  22 23  GLUCOSE 114* 123*  --  120* 117*  BUN 12 15  --  23 28*  CREATININE 1.14* 1.26*  --  1.37* 1.57*  CALCIUM  9.2 8.7*  --  8.9 9.0  AST  --   --   --   --  14*  ALT  --   --   --   --  10  ALKPHOS  --   --   --   --  69  BILITOT  --   --   --   --  0.9  ALBUMIN   --   --   --   --  2.3*  MG  --   --   --  1.6* 2.3  AMMONIA  --   --  25  --   --     ------------------------------------------------------------------------------------------------------------------ No results for input(s): CHOL, HDL, LDLCALC, TRIG, CHOLHDL, LDLDIRECT in the last 72 hours.  Lab Results  Component Value Date   HGBA1C 5.6 11/25/2018   ------------------------------------------------------------------------------------------------------------------ No results for input(s): TSH, T4TOTAL, T3FREE, THYROIDAB in the last 72 hours.  Invalid input(s): FREET3  Cardiac Enzymes No results for input(s): CKMB, TROPONINI, MYOGLOBIN in the last 168 hours.  Invalid input(s): CK ------------------------------------------------------------------------------------------------------------------ No results found for: BNP  CBG: Recent Labs  Lab 05/31/24 1059 06/01/24 0737 06/01/24 1121 06/01/24 1514 06/01/24 2353  GLUCAP 130* 126* 123* 145* 126*    Recent  Results (from the past 240 hours)  Surgical pcr screen     Status: Abnormal   Collection Time: 05/31/24 10:40 AM   Specimen: Nasal Mucosa; Nasal Swab  Result Value Ref Range Status   MRSA, PCR NEGATIVE NEGATIVE Final   Staphylococcus aureus POSITIVE (A) NEGATIVE Final    Comment: (NOTE) The Xpert SA Assay (FDA approved for NASAL specimens in patients 53 years of age and older), is one component of a comprehensive surveillance program. It is not intended to diagnose infection nor to guide or monitor treatment. Performed at [facility], [address] [place], [place] [address]      Radiology Studies: No results found.    Nilda Fendt, MD, PhD [facility]  Between 7 am - 7 pm I am available, please contact me via Amion (for emergencies) or Securechat (non urgent messages)  Between 7 pm - 7 am I am not available, please contact night coverage MD/APP via Amion

## 2024-06-03 NOTE — Progress Notes (Signed)
 Occupational Therapy Treatment Patient Details Name: Deanna Acosta MRN: [id number] DOB: 05-Dec-1935 Today's Date: 06/03/2024   History of present illness Deanna Acosta is a 88 y.o. female admitted 05/31/24 after fall sustaining a severely communicated and displaced left acetabular fracture and a minimally displaced left pubic ramus fracture. Pt s/p ORIF 8/26. PMHx: HTN, HLD, COPD, CKD, GERD, anxiety, depression, pre-diabetes, and osteoporosis.   OT comments  Patient seen with mobility specialist in order to advance with OOB activity. Patient continues to be unable to maintain TDWB status with max multi-modal cues provided. Patient with improved ability to complete sit<>stands with stedy and attempting to weight shift to promote weight bearing adherence. Patient left in recliner at end of session. Discharge remains appropriate, OT will continue to follow.       If plan is discharge home, recommend the following:  Two people to help with walking and/or transfers;Two people to help with bathing/dressing/bathroom;Assistance with cooking/housework;Assist for transportation;Help with stairs or ramp for entrance   Equipment Recommendations  None recommended by OT    Recommendations for Other Services      Precautions / Restrictions Precautions Precautions: Fall Recall of Precautions/Restrictions: Impaired Precaution/Restrictions Comments: watch SpO2; Wound Vac; Pt needed assist to abide by weight bearing precautions Restrictions Weight Bearing Restrictions Per Provider Order: Yes LLE Weight Bearing Per Provider Order: Touchdown weight bearing Other Position/Activity Restrictions: No motion restrictions       Mobility Bed Mobility Overal bed mobility: Needs Assistance Bed Mobility: Sit to Supine       Sit to supine: Max assist, +2 for physical assistance, +2 for safety/equipment   General bed mobility comments: Max A of 2 to transition to EOB, able to move BLEs incrementally     Transfers Overall transfer level: Needs assistance Equipment used: Ambulation equipment used Transfers: Bed to chair/wheelchair/BSC Sit to Stand: Mod assist, +2 physical assistance, +2 safety/equipment, From elevated surface           General transfer comment: Patient able to complete sit<>stand with mod A of 2 from EOB, max cues to maintain WB status. Patient able to complete 2 sit<>stands from elevated surface and weight shift to promote weight bearing adherence Transfer via Lift Equipment: Stedy   Balance Overall balance assessment: Needs assistance Sitting-balance support: Bilateral upper extremity supported, Feet supported Sitting balance-Leahy Scale: Fair Sitting balance - Comments: Pt sat on recliner chair with supervision and EOB with CGA x2.   Standing balance support: Bilateral upper extremity supported, During functional activity Standing balance-Leahy Scale: Zero Standing balance comment: Pt dependent on stedy and +2 assist.                           ADL either performed or assessed with clinical judgement   ADL Overall ADL's : Needs assistance/impaired                 Upper Body Dressing : Contact guard assist;Minimal assistance;Sitting       Toilet Transfer: Moderate assistance;+2 for safety/equipment;+2 for physical assistance Toilet Transfer Details (indicate cue type and reason): use of stedy         Functional mobility during ADLs: Moderate assistance;+2 for physical assistance;+2 for safety/equipment;Cueing for safety;Cueing for sequencing General ADL Comments: Patient seen with mobility specialist in order to advance with OOB activity. Patient continues to be unable to maintain TDWB status with max multi-modal cues provided. Patient with improved ability to complete sit<>stands with stedy and attempting to weight shift  to promote weight bearing adherence. Patient left in recliner at end of session. Discharge remains appropriate, OT will  continue to follow.    Extremity/Trunk Assessment              Occupational psychologist Communication: Impaired Factors Affecting Communication: Hearing impaired (significantly HOH)   Cognition Arousal: Alert Behavior During Therapy: WFL for tasks assessed/performed Cognition: Difficult to assess Difficult to assess due to: Hard of hearing/deaf                             Following commands: Intact        Cueing   Cueing Techniques: Verbal cues, Gestural cues, Tactile cues, Visual cues  Exercises      Shoulder Instructions       General Comments Able to decrease to 2L O2 in session    Pertinent Vitals/ Pain       Pain Assessment Pain Assessment: Faces Faces Pain Scale: Hurts even more Pain Location: L shoulder, L back, and L hip Pain Descriptors / Indicators: Discomfort, Grimacing, Operative site guarding, Guarding, Aching Pain Intervention(s): Limited activity within patient's tolerance, Monitored during session, Repositioned, Premedicated before session  Home Living                                          Prior Functioning/Environment              Frequency  Min 2X/week        Progress Toward Goals  OT Goals(current goals can now be found in the care plan section)  Progress towards OT goals: Progressing toward goals  Acute Rehab OT Goals Patient Stated Goal: to get to rehab OT Goal Formulation: With patient Time For Goal Achievement: 06/15/24 Potential to Achieve Goals: Good  Plan      Co-evaluation                 AM-PAC OT 6 Clicks Daily Activity     Outcome Measure   Help from another person eating meals?: A Little Help from another person taking care of personal grooming?: A Little Help from another person toileting, which includes using toliet, bedpan, or urinal?: A Lot Help from another person bathing (including washing, rinsing, drying)?: A  Lot Help from another person to put on and taking off regular upper body clothing?: A Little Help from another person to put on and taking off regular lower body clothing?: Total 6 Click Score: 14    End of Session Equipment Utilized During Treatment: Gait belt;Rolling walker (2 wheels);Oxygen   OT Visit Diagnosis: Unsteadiness on feet (R26.81);Other abnormalities of gait and mobility (R26.89);History of falling (Z91.81);Pain Pain - Right/Left: Left Pain - part of body: Leg;Shoulder;Hip   Activity Tolerance Patient tolerated treatment well   Patient Left in chair;with call bell/phone within reach;with chair alarm set;with family/visitor present   Nurse Communication Mobility status        Time: 8971-8950 OT Time Calculation (min): 21 min  Charges: OT General Charges $OT Visit: 1 Visit OT Treatments $Self Care/Home Management : 8-22 mins  Ronal Gift E. Tracker Mance, OTR/L Acute Rehabilitation Services [phone]   Ronal Gift Salt 06/03/2024, 1:33 PM

## 2024-06-03 NOTE — Progress Notes (Signed)
 Orthopaedic Trauma Service Progress Note  Patient ID: Deanna Acosta MRN: [id number] DOB/AGE: 02-25-1936 88 y.o.  Subjective:  No acute issues Ortho issues stable  No specific complaints   Cbc looks good   ROS As above  Today's  total administered Morphine  Milligram Equivalents: 0 Yesterday's total administered Morphine  Milligram Equivalents: 0  Objective:   VITALS:   Vitals:   06/02/24 1911 06/03/24 0314 06/03/24 0500 06/03/24 0743  BP: (!) 122/38 (!) 108/43  (!) 124/51  Pulse: 65 (!) 58  62  Resp: 18 20  15   Temp: 97.7 F (36.5 C) 98 F (36.7 C)  (!) 97.4 F (36.3 C)  TempSrc:  Oral  Oral  SpO2: 97% 98%  98%  Weight:   85 kg   Height:        Estimated body mass index is 32.17 kg/m as calculated from the following:   Height as of this encounter: 5' 4 (1.626 m).   Weight as of this encounter: 85 kg.   Intake/Output      08/28 0701 08/29 0700 08/29 0701 08/30 0700   I.V. (mL/kg) 0 (0)    Total Intake(mL/kg) 0 (0)    Urine (mL/kg/hr)     Drains     Total Output     Net 0         Urine Occurrence 3 x      LABS  Results for orders placed or performed during the hospital encounter of 05/31/24 (from the past 24 hours)  Comprehensive metabolic panel with GFR     Status: Abnormal   Collection Time: 06/03/24  5:37 AM  Result Value Ref Range   Sodium 137 135 - 145 mmol/L   Potassium 3.8 3.5 - 5.1 mmol/L   Chloride 103 98 - 111 mmol/L   CO2 23 22 - 32 mmol/L   Glucose, Bld 117 (H) 70 - 99 mg/dL   BUN 28 (H) 8 - 23 mg/dL   Creatinine, Ser 8.42 (H) 0.44 - 1.00 mg/dL   Calcium  9.0 8.9 - 10.3 mg/dL   Total Protein 5.2 (L) 6.5 - 8.1 g/dL   Albumin  2.3 (L) 3.5 - 5.0 g/dL   AST 14 (L) 15 - 41 U/L   ALT 10 0 - 44 U/L   Alkaline Phosphatase 69 38 - 126 U/L   Total Bilirubin 0.9 0.0 - 1.2 mg/dL   GFR, Estimated 32 (L) >60 mL/min   Anion gap 11 5 - 15  CBC     Status: Abnormal    Collection Time: 06/03/24  5:37 AM  Result Value Ref Range   WBC 14.7 (H) 4.0 - 10.5 K/uL   RBC 3.23 (L) 3.87 - 5.11 MIL/uL   Hemoglobin 9.8 (L) 12.0 - 15.0 g/dL   HCT 69.6 (L) 63.9 - 53.9 %   MCV 93.8 80.0 - 100.0 fL   MCH 30.3 26.0 - 34.0 pg   MCHC 32.3 30.0 - 36.0 g/dL   RDW 87.2 88.4 - 84.4 %   Platelets 206 150 - 400 K/uL   nRBC 0.0 0.0 - 0.2 %  Magnesium      Status: None   Collection Time: 06/03/24  5:37 AM  Result Value Ref Range   Magnesium  2.3 1.7 - 2.4 mg/dL     PHYSICAL EXAM:   Gen: sitting up  in bed, NAD, looks comfortable  Lungs: unlabored Cardiac: reg Abd: NTND, + BS Pelvis:       Incision clean, looks good   New mepilex applied        Ext warm        + DP pulse       No DCT        DPN, SPN, TN sensation intact       Obturator sensation and motor intact  Ext:        EHL, FHL, lesser toe motor intact       Ankle flexion, extension, inversion, eversion intact  Assessment/Plan: 3 Days Post-Op   Principal Problem:   Left acetabular fracture (HCC) Active Problems:   HTN (hypertension)   Chronic kidney disease, stage III (moderate) (HCC)   COPD (chronic obstructive pulmonary disease) (HCC)   Closed fracture of left inferior pubic ramus (HCC)   Fall at home, initial encounter   Leukocytosis   Hyperlipidemia   GERD (gastroesophageal reflux disease)   Malnutrition of moderate degree   Anti-infectives (From admission, onward)    Start     Dose/Rate Route Frequency Ordered Stop   06/01/24 0100  vancomycin  (VANCOCIN ) IVPB 1000 mg/200 mL premix        1,000 mg 200 mL/hr over 60 Minutes Intravenous Every 12 hours 05/31/24 1720 06/01/24 0200   05/31/24 1155  vancomycin  (VANCOCIN ) 1-5 GM/200ML-% IVPB       Note to Pharmacy: Roslynn Birmingham: cabinet override      05/31/24 1155 05/31/24 2359     .  POD/HD#: 72  88 y/o female s/p fall with fragility fracture of left acetabulum    - fall   - L acetabulum fracture with central protrusion s/p ORIF                    TDWB L lower extremity              No motion restrictions              Therapies             Dressing change prior to dc              TOC consult for SNF  Ok to ride recumbent exercise bike at SNF if available    - L shoulder pain              Xrays negative for fracture             Activity as tolerated              Possible RTC injury, will monitor              Ice or heat    -  L hand skin tear             Local care    - Pain management:             Multimodal   - ABL anemia/Hemodynamics             Stable   - Medical issues              Per primary    - DVT/PE prophylaxis:             Lovenox    - ID:              Periop abx completed    - Metabolic Bone Disease:  Fracture is a fragility fracture= osteoporosis             Vitamin d  levels look great    Continue home regimen    - Activity:             As above   - Impediments to fracture healing:             Fragility fracture              COPD   - Dispo:             Therapies              Stable for snf  Follow up with ortho in 10-14 days      Francis MICAEL Mt, PA-C [phone] (C) 06/03/2024, 10:44 AM  [facility] [address] [place] [address] [phone] (F)    After 5pm and on the weekends please log on to Amion, go to orthopaedics and the look under the [facility] Call for the provider(s) on call. You can also call our office at [phone] and then follow the prompts to be connected to the call team.  Patient ID: Deanna Acosta, female   DOB: 09-10-1936, 88 y.o.   MRN: [id number]

## 2024-06-03 NOTE — Plan of Care (Signed)

## 2024-06-04 DIAGNOSIS — S32402A Unspecified fracture of left acetabulum, initial encounter for closed fracture: Secondary | ICD-10-CM | POA: Diagnosis not present

## 2024-06-04 LAB — BASIC METABOLIC PANEL WITH GFR
Anion gap: 11 (ref 5–15)
BUN: 29 mg/dL — ABNORMAL HIGH (ref 8–23)
CO2: 19 mmol/L — ABNORMAL LOW (ref 22–32)
Calcium: 8.8 mg/dL — ABNORMAL LOW (ref 8.9–10.3)
Chloride: 107 mmol/L (ref 98–111)
Creatinine, Ser: 1.24 mg/dL — ABNORMAL HIGH (ref 0.44–1.00)
GFR, Estimated: 42 mL/min — ABNORMAL LOW (ref >60)
Glucose, Bld: 135 mg/dL — ABNORMAL HIGH (ref 70–99)
Potassium: 4.8 mmol/L (ref 3.5–5.1)
Sodium: 137 mmol/L (ref 135–145)

## 2024-06-04 LAB — MAGNESIUM: Magnesium: 2.1 mg/dL (ref 1.7–2.4)

## 2024-06-04 NOTE — Plan of Care (Signed)

## 2024-06-04 NOTE — TOC Transition Note (Signed)
 Transition of Care [facility]) - Discharge Note   Patient Details  Name: Deanna Acosta MRN: [id number] Date of Birth: December 06, 1935  [facility] CM/SW Contact:  Hartley KATHEE Robertson, LCSWA Phone Number: 06/04/2024, 1:09 PM   Clinical Narrative:     Patient will DC to: [facility]  Anticipated DC date: 06/04/24 Family notified: daughter, [facility] Transport by: [facility]    Per MD patient ready for DC to [facility]. RN to call report prior to discharge [phone]. RN, patient, patient's family, and facility notified of DC. Discharge Summary and FL2 sent to facility. DC packet on chart. Ambulance transport requested for patient.   CSW will sign off for now as social work intervention is no longer needed. Please consult us  again if new needs arise.      Barriers to Discharge: Continued Medical Work up, SNF Pending bed offer   Patient Goals and CMS Choice            Discharge Placement                       Discharge Plan and Services Additional resources added to the After Visit Summary for   In-house Referral: Clinical Social Work   Post Acute Care Choice: Skilled Nursing Facility                               Social Drivers of Health (SDOH) Interventions SDOH Screenings   Food Insecurity: No Food Insecurity (06/01/2024)  Housing: Low Risk  (06/01/2024)  Transportation Needs: No Transportation Needs (06/02/2024)  Utilities: Not At Risk (06/02/2024)  Social Connections: Moderately Integrated (06/02/2024)  Tobacco Use: Medium Risk (05/31/2024)     Readmission Risk Interventions     No data to display

## 2024-06-04 NOTE — Progress Notes (Signed)
 Patient discharging to SNF [facility], AVS printed and at bedside awaiting ptar for transportation, IV access, removed, x4 attempts to reach [facility] to give report no answer.

## 2024-06-04 NOTE — Discharge Summary (Signed)
 Physician Discharge Summary  Deanna Acosta FMW:[id number] DOB: 06/27/1936 DOA: 05/31/2024  PCP: Cleotilde Planas, MD  Admit date: 05/31/2024 Discharge date: 06/04/2024  Admitted From: home Disposition:  SNF  Recommendations for Outpatient Follow-up:  Follow up with PCP in 1-2 weeks Follow up with orthopedics in 2 weeks  Home Health: none Equipment/Devices: none  Discharge Condition: stable CODE STATUS: Full code Diet Orders (From admission, onward)     Start     Ordered   05/31/24 1721  Diet regular Room service appropriate? Yes; Fluid consistency: Thin  Diet effective now       Question Answer Comment  Room service appropriate? Yes   Fluid consistency: Thin      05/31/24 1720           Brief Narrative / Interim history: 88 y.o. female with medical history significant of hypertension, hyperlipidemia, COPD, osteoporosis presents after having a fall at home.  She was found to have a left acetabular fracture status post operative repair.  Hospital Course / Discharge diagnoses: Principal Problem:   Left acetabular fracture [facility]) Active Problems:   Closed fracture of left inferior pubic ramus (HCC)   Fall at home, initial encounter   Leukocytosis   COPD (chronic obstructive pulmonary disease) (HCC)   HTN (hypertension)   Hyperlipidemia   Chronic kidney disease, stage III (moderate) (HCC)   GERD (gastroesophageal reflux disease)   Malnutrition of moderate degree   Principal problem Left acetabular fracture, left inferior pubic ramus fracture-orthopedic surgery consulted, she was taken to the OR for repair on 8/26. PT/OT recommending SNF / rehab, she will be discharged in stable condition   Active problems Left shoulder pain-imaging without acute fractures but possible calcific tendinopathy.  Symptomatic management In hospital-acquired delirium-more delirious overnight 8/28, there is no history of dementia.  Suspect multifactorial in the setting of being  hospitalized with an acute illness, general anesthesia as well as use of oxycodone .  Discontinued narcotics, mental status improved. Leukocytosis-likely due to 1.  Improving now Borderline low B12-replenished while here, continue weekly injections for a month and monthly injections Hypomagnesemia-magnesium  normalized COPD-no wheezing, this is stable Hypertension-continue home medications, blood pressure acceptable Hyperlipidemia-continue statin CKD 3A-creatinine overall stable, continue to monitor periodically  Chronic back pain-continue pain control with nonnarcotic approaches, DC oxycodone  due to delirium Obesity, class I-BMI 31  Sepsis ruled out   Discharge Instructions   Allergies as of 06/04/2024       Reactions   Cortisone Other (See Comments)   Headaches   Penicillins Hives, Other (See Comments)   Has patient had a PCN reaction causing immediate rash, facial/tongue/throat swelling, SOB or lightheadedness with hypotension: Yes Has patient had a PCN reaction causing severe rash involving mucus membranes or skin necrosis: No Has patient had a PCN reaction that required hospitalization No Has patient had a PCN reaction occurring within the last 10 years: No If all of the above answers are NO, then may proceed with Cephalosporin use.   Losartan  Diarrhea, Nausea And Vomiting   Other Nausea And Vomiting, Other (See Comments)   Fluid pills cause cramps   Tape Other (See Comments)   SKIN IS VERY THIN AND BRUISES AND TEARS VERY EASILY!!!!!   Levofloxacin  Rash        Medication List     STOP taking these medications    potassium chloride  SA 20 MEQ tablet Commonly known as: KLOR-CON  M       TAKE these medications    acetaminophen  500 MG tablet  Commonly known as: TYLENOL  Take 2 tablets (1,000 mg total) by mouth every 6 (six) hours as needed for mild pain (pain score 1-3).   Advair HFA 115-21 MCG/ACT inhaler Generic drug: fluticasone -salmeterol Inhale 2 puffs into  the lungs 2 (two) times daily.   albuterol  108 (90 Base) MCG/ACT inhaler Commonly known as: VENTOLIN  HFA Inhale 2 puffs into the lungs every 6 (six) hours as needed for wheezing or shortness of breath.   albuterol  (2.5 MG/3ML) 0.083% nebulizer solution Commonly known as: PROVENTIL  Take 3 mLs (2.5 mg total) by nebulization every 4 (four) hours as needed for wheezing or shortness of breath.   aspirin  325 MG tablet Take 325 mg by mouth daily.   Betasept  Surgical Scrub 4 % external liquid Generic drug: chlorhexidine  Apply 15 mLs (1 Application total) topically as directed for 30 doses. Use as directed daily for 5 days every other week for 6 weeks.   cholecalciferol  25 MCG (1000 UNIT) tablet Commonly known as: VITAMIN D3 Take 1,000 Units by mouth daily.   diltiazem  120 MG 24 hr capsule Commonly known as: CARDIZEM  CD Take 120 mg by mouth daily.   diphenhydrAMINE  25 MG tablet Commonly known as: BENADRYL  Take 1 tablet (25 mg total) by mouth every 6 (six) hours. What changed:  when to take this reasons to take this   diphenoxylate -atropine  2.5-0.025 MG tablet Commonly known as: Lomotil  Take 1-2 tablets by mouth 4 (four) times daily as needed for diarrhea or loose stools.   enoxaparin  40 MG/0.4ML injection Commonly known as: LOVENOX  Inject 0.4 mLs (40 mg total) into the skin daily for 21 days.   ferrous sulfate  325 (65 FE) MG tablet Take 325 mg by mouth daily with breakfast.   FLUoxetine  40 MG capsule Commonly known as: PROZAC  Take 40 mg by mouth daily before breakfast.   hydrochlorothiazide  25 MG tablet Commonly known as: HYDRODIURIL  Take 25 mg by mouth daily.   metoprolol  succinate 100 MG 24 hr tablet Commonly known as: TOPROL -XL Take 100 mg by mouth daily.   mupirocin  ointment 2 % Commonly known as: BACTROBAN  Place 1 Application into the nose 2 (two) times daily for 60 doses. Use as directed 2 times daily for 5 days every other week for 6 weeks.   omeprazole 40 MG  capsule Commonly known as: PRILOSEC Take 40 mg by mouth daily.   rosuvastatin  5 MG tablet Commonly known as: CRESTOR  Take 5 mg by mouth daily.        Follow-up Information     Cleotilde Planas, MD Follow up.   Specialty: Family Medicine Contact information: [address] [place] [address] [phone]         Celena Sharper, MD. Schedule an appointment as soon as possible for a visit in 2 week(s).   Specialty: Orthopedic Surgery Contact information: [address] [place] [address] [phone]                 Consultations: Orthopedic surgery   Procedures/Studies:  DG Shoulder Left Result Date: 06/01/2024 CLINICAL DATA:  Left shoulder pain. EXAM: LEFT SHOULDER - 2+ VIEW COMPARISON:  None Available. FINDINGS: There is no evidence of fracture or dislocation. There is no evidence of arthropathy or other focal bone abnormality. Small soft tissue calcification adjacent to the lateral humeral head. IMPRESSION: 1. No acute findings. 2. Small soft tissue calcification adjacent to the lateral humeral head, can be seen with calcific tendinopathy. Electronically Signed   By: Andrea Gasman M.D.   On: 06/01/2024  12:35   DG Pelvis Comp Min 3V Result Date: 05/31/2024 CLINICAL DATA:  Left acetabular fracture, status postoperative reduction and internal fixation EXAM: JUDET PELVIS - 3+ VIEW COMPARISON:  CT scan 05/31/2024 FINDINGS: Acetabular plate and screw fixators including an anterior acetabular wall fixator extending along the iliac bone, anterior wall, superior pubic ramus into the pubic body; and a component extending along the posterior acetabular wall. Marked improvement in alignment and resolution of prior protrusio appearance due to segmental fracture. Nondisplaced left inferior pubic ramus fracture again observed. Radiodensities projecting along the sacral spinal canal favor residual oil-based contrast medium such as Pantopaque or Myodil, and have been  present on prior exams including 01/26/2017. IMPRESSION: 1. Marked improvement in alignment of the left acetabular fracture status post ORIF. 2. Nondisplaced left inferior pubic ramus fracture. 3. Radiodensities projecting along the sacral spinal canal favor residual oil-based contrast medium such as Pantopaque or Myodil, and have been present on prior exams including 01/26/2017. Electronically Signed   By: Ryan Salvage M.D.   On: 05/31/2024 21:56   DG Pelvis Comp Min 3V Result Date: 05/31/2024 CLINICAL DATA:  461500 Elective surgery 461500 EXAM: JUDET PELVIS - 3+ VIEW COMPARISON:  Preoperative imaging FINDINGS: Ten fluoroscopic spot views of the pelvis and hips submitted from the operating room. Plate and screw fixation of comminuted acetabular fracture. Improved fracture alignment. Fluoroscopy time 21.8 seconds. Dose 6.73 mGy. IMPRESSION: Fluoroscopy during acetabular fracture ORIF. Electronically Signed   By: Andrea Gasman M.D.   On: 05/31/2024 15:34   DG C-Arm 1-60 Min-No Report Result Date: 05/31/2024 Fluoroscopy was utilized by the requesting physician.  No radiographic interpretation.   DG C-Arm 1-60 Min-No Report Result Date: 05/31/2024 Fluoroscopy was utilized by the requesting physician.  No radiographic interpretation.   CT PELVIS WO CONTRAST Result Date: 05/31/2024 CLINICAL DATA:  Left-sided pelvic fracture. EXAM: CT PELVIS WITHOUT CONTRAST TECHNIQUE: Multidetector CT imaging of the pelvis was performed following the standard protocol without intravenous contrast. RADIATION DOSE REDUCTION: This exam was performed according to the departmental dose-optimization program which includes automated exposure control, adjustment of the mA and/or kV according to patient size and/or use of iterative reconstruction technique. COMPARISON:  None Available. FINDINGS: Urinary Tract:  Urinary bladder is unremarkable. Bowel:  Sigmoid diverticulosis is noted without inflammation. Vascular/Lymphatic:  Aortic atherosclerosis.  No adenopathy. Reproductive:  Status post hysterectomy.  No adnexal abnormality. Other: No ascites or hernia is noted. No large pelvic hematoma is noted. Musculoskeletal: There is noted severely comminuted and displaced left acetabular fracture with significant medial displacement of fracture fragments. Minimally displaced fracture is seen involving left inferior pubic ramus. The acetabular fracture involves anterior, posterior medial and superior portions of the acetabulum. There is medial and superior displacement of the left femoral head into the pelvis. There also may be mild widening of the right sacroiliac joint posteriorly and superiorly suggesting some degree of diastasis. IMPRESSION: 1. Severely comminuted and displaced left acetabular fracture is noted as described above. There is also noted medial and superior displacement of the left femoral head into the pelvis. 2. Minimally displaced left inferior pubic ramus fracture is noted. 3. There is also noted mild widening of the right sacroiliac joint posteriorly and superiorly suggesting some degree of diastasis. 4. Sigmoid diverticulosis without inflammation. 5. Aortic atherosclerosis. Aortic Atherosclerosis (ICD10-I70.0). Electronically Signed   By: [name] Landy Raddle M.D.   On: 05/31/2024 09:54   DG Pelvis 1-2 Views Result Date: 05/31/2024 CLINICAL DATA:  Left hip pain after fall while walking  dog. EXAM: PELVIS - 1-2 VIEW COMPARISON:  None Available. FINDINGS: Severely displaced and comminuted left acetabular fracture is noted. Nondisplaced left inferior pubic ramus fracture is noted. The proximal left femur appears unremarkable. IMPRESSION: Severely displaced and comminuted left acetabular fracture. Nondisplaced left inferior pubic ramus fracture. Electronically Signed   By: [name] Landy Raddle M.D.   On: 05/31/2024 09:00   DG FEMUR MIN 2 VIEWS LEFT Result Date: 05/31/2024 EXAM: 2 VIEWS XRAY OF THE LEFT FEMUR 05/31/2024 08:19:00 AM  COMPARISON: None available. CLINICAL HISTORY: Fall; Triage notes: Patient slipped and fell while walking to let her dog out. Patient complains of left hip pain. Patient is A\T\Ox4. No LOC. No thinners. FINDINGS: BONES AND JOINTS: Left total knee arthroplasty in place. Left acetabular fracture. Posterior displacement of the left femoral head with respect to the acetabulum. Fracture through the left inferior pubic rami noted. There may also be a fracture involving the lateral aspect of the superior pubic rami. SOFT TISSUES: The soft tissues are unremarkable. IMPRESSION: 1. Left acetabular fracture with posterior displacement of the left femoral head. 2. Fracture through the left inferior pubic rami and possible fracture involving the lateral aspect of the superior pubic rami. 3. Left total knee arthroplasty in place. Electronically signed by: Waddell Calk MD 05/31/2024 08:57 AM EDT RP Workstation: [id number]   DG Wrist Complete Left Result Date: 05/31/2024 CLINICAL DATA:  Fall. EXAM: LEFT WRIST - COMPLETE 3+ VIEW COMPARISON:  None Available. FINDINGS: There is no evidence of fracture or dislocation. There is no evidence of arthropathy or other focal bone abnormality. Soft tissues are unremarkable. IMPRESSION: Negative. Electronically Signed   By: [name] Landy Raddle M.D.   On: 05/31/2024 08:56   MM 3D DIAGNOSTIC MAMMOGRAM BILATERAL BREAST Result Date: 05/10/2024 CLINICAL DATA:  88 year old female presents for prominent appearing bilateral axillary and LEFT intraparenchymal lymph nodes identified on screening mammogram. EXAM: DIGITAL DIAGNOSTIC BILATERAL MAMMOGRAM WITH TOMOSYNTHESIS AND CAD; US  AXILLARY RIGHT; ULTRASOUND LEFT BREAST LIMITED TECHNIQUE: Bilateral digital diagnostic mammography and breast tomosynthesis was performed. The images were evaluated with computer-aided detection. ; Targeted ultrasound examination of the right axilla was performed.; Targeted ultrasound examination of the left breast was  performed. COMPARISON:  Previous exam(s). ACR Breast Density Category b: There are scattered areas of fibroglandular density. FINDINGS: Full field views of both breast and spot compression views of both axillary regions demonstrate bilateral axillary lymph nodes with apparent mild cortical thickening. A mildly prominent intraparenchymal lymph node within the far UPPER OUTER LEFT breast is present. No other new or suspicious mammographic findings are noted. Targeted ultrasound is performed, showing normal appearing lymph nodes with normal cortical thickness in bilateral axillary regions and a normal appearing intra mammary lymph node at the 2 o'clock position of the LEFT breast 11 cm from the nipple. IMPRESSION: 1. Normal appearing bilateral axillary lymph nodes and UPPER OUTER LEFT breast intramammary lymph node. No further follow-up recommended. RECOMMENDATION: Bilateral screening mammogram in 1 year as clinically indicated. I have discussed the findings and recommendations with the patient. If applicable, a reminder letter will be sent to the patient regarding the next appointment. BI-RADS CATEGORY  2: Benign. Electronically Signed   By: Reyes Phi M.D.   On: 05/10/2024 10:31   US  LIMITED ULTRASOUND INCLUDING AXILLA LEFT BREAST  Result Date: 05/10/2024 CLINICAL DATA:  88 year old female presents for prominent appearing bilateral axillary and LEFT intraparenchymal lymph nodes identified on screening mammogram. EXAM: DIGITAL DIAGNOSTIC BILATERAL MAMMOGRAM WITH TOMOSYNTHESIS AND CAD; US  AXILLARY RIGHT; ULTRASOUND LEFT  BREAST LIMITED TECHNIQUE: Bilateral digital diagnostic mammography and breast tomosynthesis was performed. The images were evaluated with computer-aided detection. ; Targeted ultrasound examination of the right axilla was performed.; Targeted ultrasound examination of the left breast was performed. COMPARISON:  Previous exam(s). ACR Breast Density Category b: There are scattered areas of  fibroglandular density. FINDINGS: Full field views of both breast and spot compression views of both axillary regions demonstrate bilateral axillary lymph nodes with apparent mild cortical thickening. A mildly prominent intraparenchymal lymph node within the far UPPER OUTER LEFT breast is present. No other new or suspicious mammographic findings are noted. Targeted ultrasound is performed, showing normal appearing lymph nodes with normal cortical thickness in bilateral axillary regions and a normal appearing intra mammary lymph node at the 2 o'clock position of the LEFT breast 11 cm from the nipple. IMPRESSION: 1. Normal appearing bilateral axillary lymph nodes and UPPER OUTER LEFT breast intramammary lymph node. No further follow-up recommended. RECOMMENDATION: Bilateral screening mammogram in 1 year as clinically indicated. I have discussed the findings and recommendations with the patient. If applicable, a reminder letter will be sent to the patient regarding the next appointment. BI-RADS CATEGORY  2: Benign. Electronically Signed   By: Reyes Phi M.D.   On: 05/10/2024 10:31   US  AXILLA RIGHT Result Date: 05/10/2024 CLINICAL DATA:  88 year old female presents for prominent appearing bilateral axillary and LEFT intraparenchymal lymph nodes identified on screening mammogram. EXAM: DIGITAL DIAGNOSTIC BILATERAL MAMMOGRAM WITH TOMOSYNTHESIS AND CAD; US  AXILLARY RIGHT; ULTRASOUND LEFT BREAST LIMITED TECHNIQUE: Bilateral digital diagnostic mammography and breast tomosynthesis was performed. The images were evaluated with computer-aided detection. ; Targeted ultrasound examination of the right axilla was performed.; Targeted ultrasound examination of the left breast was performed. COMPARISON:  Previous exam(s). ACR Breast Density Category b: There are scattered areas of fibroglandular density. FINDINGS: Full field views of both breast and spot compression views of both axillary regions demonstrate bilateral axillary  lymph nodes with apparent mild cortical thickening. A mildly prominent intraparenchymal lymph node within the far UPPER OUTER LEFT breast is present. No other new or suspicious mammographic findings are noted. Targeted ultrasound is performed, showing normal appearing lymph nodes with normal cortical thickness in bilateral axillary regions and a normal appearing intra mammary lymph node at the 2 o'clock position of the LEFT breast 11 cm from the nipple. IMPRESSION: 1. Normal appearing bilateral axillary lymph nodes and UPPER OUTER LEFT breast intramammary lymph node. No further follow-up recommended. RECOMMENDATION: Bilateral screening mammogram in 1 year as clinically indicated. I have discussed the findings and recommendations with the patient. If applicable, a reminder letter will be sent to the patient regarding the next appointment. BI-RADS CATEGORY  2: Benign. Electronically Signed   By: Reyes Phi M.D.   On: 05/10/2024 10:31     Subjective: - no chest pain, shortness of breath, no abdominal pain, nausea or vomiting.   Discharge Exam: BP (!) 154/68 (BP Location: Right Arm)   Pulse 66   Temp 98.1 F (36.7 C) (Oral)   Resp 16   Ht 5' 4 (1.626 m)   Wt 85 kg   SpO2 100%   BMI 32.17 kg/m   General: Pt is alert, awake, not in acute distress Cardiovascular: RRR, S1/S2 +, no rubs, no gallops Respiratory: CTA bilaterally, no wheezing, no rhonchi Abdominal: Soft, NT, ND, bowel sounds + Extremities: no edema, no cyanosis    The results of significant diagnostics from this hospitalization (including imaging, microbiology, ancillary and laboratory) are listed below  for reference.     Microbiology: Recent Results (from the past 240 hours)  Surgical pcr screen     Status: Abnormal   Collection Time: 05/31/24 10:40 AM   Specimen: Nasal Mucosa; Nasal Swab  Result Value Ref Range Status   MRSA, PCR NEGATIVE NEGATIVE Final   Staphylococcus aureus POSITIVE (A) NEGATIVE Final    Comment:  (NOTE) The Xpert SA Assay (FDA approved for NASAL specimens in patients 52 years of age and older), is one component of a comprehensive surveillance program. It is not intended to diagnose infection nor to guide or monitor treatment. Performed at [facility], [address] [place], [place] [address]      Labs: Basic Metabolic Panel: Recent Labs  Lab 05/31/24 0720 06/01/24 0512 06/02/24 0804 06/03/24 0537 06/04/24 1025  NA 138 135 137 137 137  K 4.1 4.1 3.9 3.8 4.8  CL 106 105 104 103 107  CO2 23 23 22 23  19*  GLUCOSE 114* 123* 120* 117* 135*  BUN 12 15 23  28* 29*  CREATININE 1.14* 1.26* 1.37* 1.57* 1.24*  CALCIUM  9.2 8.7* 8.9 9.0 8.8*  MG  --   --  1.6* 2.3 2.1   Liver Function Tests: Recent Labs  Lab 06/03/24 0537  AST 14*  ALT 10  ALKPHOS 69  BILITOT 0.9  PROT 5.2*  ALBUMIN  2.3*   CBC: Recent Labs  Lab 05/31/24 0720 06/01/24 0512 06/02/24 0804 06/03/24 0537  WBC 12.2* 15.8* 17.2* 14.7*  NEUTROABS 9.9*  --   --   --   HGB 13.2 10.6* 10.0* 9.8*  HCT 39.9 31.5* 30.2* 30.3*  MCV 91.1 91.0 91.8 93.8  PLT 249 244 217 206   CBG: Recent Labs  Lab 05/31/24 1059 06/01/24 0737 06/01/24 1121 06/01/24 1514 06/01/24 2353  GLUCAP 130* 126* 123* 145* 126*   Hgb A1c No results for input(s): HGBA1C in the last 72 hours. Lipid Profile No results for input(s): CHOL, HDL, LDLCALC, TRIG, CHOLHDL, LDLDIRECT in the last 72 hours. Thyroid function studies No results for input(s): TSH, T4TOTAL, T3FREE, THYROIDAB in the last 72 hours.  Invalid input(s): FREET3 Urinalysis    Component Value Date/Time   COLORURINE YELLOW 06/01/2024 2327   APPEARANCEUR HAZY (A) 06/01/2024 2327   LABSPEC 1.013 06/01/2024 2327   PHURINE 5.0 06/01/2024 2327   GLUCOSEU NEGATIVE 06/01/2024 2327   HGBUR NEGATIVE 06/01/2024 2327   BILIRUBINUR NEGATIVE 06/01/2024 2327   KETONESUR NEGATIVE 06/01/2024 2327   PROTEINUR NEGATIVE 06/01/2024 2327    UROBILINOGEN 1.0 06/21/2013 1816   NITRITE NEGATIVE 06/01/2024 2327   LEUKOCYTESUR TRACE (A) 06/01/2024 2327    FURTHER DISCHARGE INSTRUCTIONS:   Get Medicines reviewed and adjusted: Please take all your medications with you for your next visit with your Primary MD   Laboratory/radiological data: Please request your Primary MD to go over all hospital tests and procedure/radiological results at the follow up, please ask your Primary MD to get all Hospital records sent to his/her office.   In some cases, they will be blood work, cultures and biopsy results pending at the time of your discharge. Please request that your primary care M.D. goes through all the records of your hospital data and follows up on these results.   Also Note the following: If you experience worsening of your admission symptoms, develop shortness of breath, life threatening emergency, suicidal or homicidal thoughts you must seek medical attention immediately by calling 911 or calling your MD immediately  if symptoms less severe.  You must read complete instructions/literature along with all the possible adverse reactions/side effects for all the Medicines you take and that have been prescribed to you. Take any new Medicines after you have completely understood and accpet all the possible adverse reactions/side effects.    Do not drive when taking Pain medications or sleeping medications (Benzodaizepines)   Do not take more than prescribed Pain, Sleep and Anxiety Medications. It is not advisable to combine anxiety,sleep and pain medications without talking with your primary care practitioner   Special Instructions: If you have smoked or chewed Tobacco  in the last 2 yrs please stop smoking, stop any regular Alcohol  and or any Recreational drug use.   Wear Seat belts while driving.   Please note: You were cared for by a hospitalist during your hospital stay. Once you are discharged, your primary care physician will  handle any further medical issues. Please note that NO REFILLS for any discharge medications will be authorized once you are discharged, as it is imperative that you return to your primary care physician (or establish a relationship with a primary care physician if you do not have one) for your post hospital discharge needs so that they can reassess your need for medications and monitor your lab values.  Time coordinating discharge: 35 minutes  SIGNED:  Nilda Fendt, MD, PhD 06/04/2024, 12:14 PM

## 2024-06-09 DIAGNOSIS — S32592D Other specified fracture of left pubis, subsequent encounter for fracture with routine healing: Secondary | ICD-10-CM | POA: Diagnosis not present

## 2024-06-09 DIAGNOSIS — J45909 Unspecified asthma, uncomplicated: Secondary | ICD-10-CM | POA: Diagnosis not present

## 2024-06-09 DIAGNOSIS — R1314 Dysphagia, pharyngoesophageal phase: Secondary | ICD-10-CM | POA: Diagnosis not present

## 2024-06-09 DIAGNOSIS — E440 Moderate protein-calorie malnutrition: Secondary | ICD-10-CM | POA: Diagnosis not present

## 2024-06-09 DIAGNOSIS — J449 Chronic obstructive pulmonary disease, unspecified: Secondary | ICD-10-CM | POA: Diagnosis not present

## 2024-06-09 DIAGNOSIS — S32402D Unspecified fracture of left acetabulum, subsequent encounter for fracture with routine healing: Secondary | ICD-10-CM | POA: Diagnosis not present

## 2024-06-09 DIAGNOSIS — M6281 Muscle weakness (generalized): Secondary | ICD-10-CM | POA: Diagnosis not present

## 2024-06-09 DIAGNOSIS — I10 Essential (primary) hypertension: Secondary | ICD-10-CM | POA: Diagnosis not present

## 2024-06-12 ENCOUNTER — Other Ambulatory Visit: Payer: Self-pay | Admitting: Internal Medicine

## 2024-06-12 MED ORDER — TRAMADOL HCL 50 MG PO TABS: 50.0000 mg | ORAL_TABLET | Freq: Four times a day (QID) | ORAL | 0 refills | Status: AC | PRN

## 2024-06-12 MED ORDER — DIPHENOXYLATE-ATROPINE 2.5-0.025 MG PO TABS: 1.0000 | ORAL_TABLET | Freq: Four times a day (QID) | ORAL | 0 refills | Status: AC | PRN

## 2024-06-13 DIAGNOSIS — S4290XA Fracture of unspecified shoulder girdle, part unspecified, initial encounter for closed fracture: Secondary | ICD-10-CM | POA: Diagnosis not present

## 2024-06-13 DIAGNOSIS — G4700 Insomnia, unspecified: Secondary | ICD-10-CM | POA: Diagnosis not present

## 2024-06-13 DIAGNOSIS — S3289XA Fracture of other parts of pelvis, initial encounter for closed fracture: Secondary | ICD-10-CM | POA: Diagnosis not present

## 2024-06-22 DIAGNOSIS — M810 Age-related osteoporosis without current pathological fracture: Secondary | ICD-10-CM | POA: Diagnosis not present

## 2024-06-22 DIAGNOSIS — E785 Hyperlipidemia, unspecified: Secondary | ICD-10-CM | POA: Diagnosis not present

## 2024-06-22 DIAGNOSIS — I10 Essential (primary) hypertension: Secondary | ICD-10-CM | POA: Diagnosis not present

## 2024-06-22 DIAGNOSIS — J449 Chronic obstructive pulmonary disease, unspecified: Secondary | ICD-10-CM | POA: Diagnosis not present

## 2024-06-22 DIAGNOSIS — M6281 Muscle weakness (generalized): Secondary | ICD-10-CM

## 2024-06-22 DIAGNOSIS — F32A Depression, unspecified: Secondary | ICD-10-CM

## 2024-06-23 ENCOUNTER — Emergency Department (HOSPITAL_COMMUNITY)

## 2024-06-23 ENCOUNTER — Observation Stay (HOSPITAL_COMMUNITY)
Admission: EM | Admit: 2024-06-23 | Discharge: 2024-06-29 | Disposition: A | Discharge status: Home Health | Attending: Internal Medicine | Admitting: Internal Medicine

## 2024-06-23 ENCOUNTER — Other Ambulatory Visit: Payer: Self-pay

## 2024-06-23 DIAGNOSIS — R110 Nausea: Principal | ICD-10-CM | POA: Insufficient documentation

## 2024-06-23 DIAGNOSIS — K2210 Ulcer of esophagus without bleeding: Secondary | ICD-10-CM | POA: Insufficient documentation

## 2024-06-23 DIAGNOSIS — N3000 Acute cystitis without hematuria: Secondary | ICD-10-CM | POA: Insufficient documentation

## 2024-06-23 DIAGNOSIS — Z96653 Presence of artificial knee joint, bilateral: Secondary | ICD-10-CM | POA: Insufficient documentation

## 2024-06-23 DIAGNOSIS — I129 Hypertensive chronic kidney disease with stage 1 through stage 4 chronic kidney disease, or unspecified chronic kidney disease: Secondary | ICD-10-CM | POA: Diagnosis not present

## 2024-06-23 DIAGNOSIS — Z8616 Personal history of COVID-19: Secondary | ICD-10-CM | POA: Insufficient documentation

## 2024-06-23 DIAGNOSIS — R1310 Dysphagia, unspecified: Secondary | ICD-10-CM | POA: Diagnosis present

## 2024-06-23 DIAGNOSIS — Z7982 Long term (current) use of aspirin: Secondary | ICD-10-CM | POA: Diagnosis not present

## 2024-06-23 DIAGNOSIS — Z79899 Other long term (current) drug therapy: Secondary | ICD-10-CM | POA: Insufficient documentation

## 2024-06-23 DIAGNOSIS — N179 Acute kidney failure, unspecified: Secondary | ICD-10-CM | POA: Diagnosis not present

## 2024-06-23 DIAGNOSIS — N1832 Chronic kidney disease, stage 3b: Secondary | ICD-10-CM | POA: Diagnosis not present

## 2024-06-23 DIAGNOSIS — J4489 Other specified chronic obstructive pulmonary disease: Secondary | ICD-10-CM | POA: Diagnosis not present

## 2024-06-23 DIAGNOSIS — R933 Abnormal findings on diagnostic imaging of other parts of digestive tract: Secondary | ICD-10-CM

## 2024-06-23 DIAGNOSIS — Z87891 Personal history of nicotine dependence: Secondary | ICD-10-CM | POA: Diagnosis not present

## 2024-06-23 DIAGNOSIS — K2100 Gastro-esophageal reflux disease with esophagitis, without bleeding: Secondary | ICD-10-CM | POA: Diagnosis not present

## 2024-06-23 DIAGNOSIS — K449 Diaphragmatic hernia without obstruction or gangrene: Secondary | ICD-10-CM | POA: Diagnosis not present

## 2024-06-23 DIAGNOSIS — K209 Esophagitis, unspecified without bleeding: Secondary | ICD-10-CM

## 2024-06-23 LAB — CBC WITH DIFFERENTIAL/PLATELET
Abs Immature Granulocytes: 0.04 K/uL (ref 0.00–0.07)
Basophils Absolute: 0.1 K/uL (ref 0.0–0.1)
Basophils Relative: 1 %
Eosinophils Absolute: 0.2 K/uL (ref 0.0–0.5)
Eosinophils Relative: 2 %
HCT: 43.7 % (ref 36.0–46.0)
Hemoglobin: 14 g/dL (ref 12.0–15.0)
Immature Granulocytes: 0 %
Lymphocytes Relative: 20 %
Lymphs Abs: 2.4 K/uL (ref 0.7–4.0)
MCH: 29.5 pg (ref 26.0–34.0)
MCHC: 32 g/dL (ref 30.0–36.0)
MCV: 92.2 fL (ref 80.0–100.0)
Monocytes Absolute: 1 K/uL (ref 0.1–1.0)
Monocytes Relative: 8 %
Neutro Abs: 8.4 K/uL — ABNORMAL HIGH (ref 1.7–7.7)
Neutrophils Relative %: 69 %
Platelets: 456 K/uL — ABNORMAL HIGH (ref 150–400)
RBC: 4.74 MIL/uL (ref 3.87–5.11)
RDW: 13.5 % (ref 11.5–15.5)
WBC: 12.1 K/uL — ABNORMAL HIGH (ref 4.0–10.5)
nRBC: 0 % (ref 0.0–0.2)

## 2024-06-23 LAB — URINALYSIS, W/ REFLEX TO CULTURE (INFECTION SUSPECTED)
Bilirubin Urine: NEGATIVE
Glucose, UA: NEGATIVE mg/dL
Hgb urine dipstick: NEGATIVE
Ketones, ur: NEGATIVE mg/dL
Nitrite: NEGATIVE
Protein, ur: NEGATIVE mg/dL
Specific Gravity, Urine: 1.023 (ref 1.005–1.030)
WBC, UA: >50 WBC/hpf (ref 0–5)
pH: 5 (ref 5.0–8.0)

## 2024-06-23 LAB — COMPREHENSIVE METABOLIC PANEL WITH GFR
ALT: 11 U/L (ref 0–44)
AST: 27 U/L (ref 15–41)
Albumin: 3.2 g/dL — ABNORMAL LOW (ref 3.5–5.0)
Alkaline Phosphatase: 175 U/L — ABNORMAL HIGH (ref 38–126)
Anion gap: 15 (ref 5–15)
BUN: 70 mg/dL — ABNORMAL HIGH (ref 8–23)
CO2: 18 mmol/L — ABNORMAL LOW (ref 22–32)
Calcium: 9.5 mg/dL (ref 8.9–10.3)
Chloride: 102 mmol/L (ref 98–111)
Creatinine, Ser: 2.11 mg/dL — ABNORMAL HIGH (ref 0.44–1.00)
GFR, Estimated: 22 mL/min — ABNORMAL LOW (ref >60)
Glucose, Bld: 98 mg/dL (ref 70–99)
Potassium: 4.3 mmol/L (ref 3.5–5.1)
Sodium: 135 mmol/L (ref 135–145)
Total Bilirubin: 0.4 mg/dL (ref 0.0–1.2)
Total Protein: 6.9 g/dL (ref 6.5–8.1)

## 2024-06-23 LAB — MAGNESIUM: Magnesium: 1.6 mg/dL — ABNORMAL LOW (ref 1.7–2.4)

## 2024-06-23 MED ORDER — HEPARIN SODIUM (PORCINE) 5000 UNIT/ML IJ SOLN
5000.0000 [IU] | Freq: Three times a day (TID) | INTRAMUSCULAR | Status: DC
  Administered 2024-06-23 – 2024-06-29 (×14): 5000 [IU] via SUBCUTANEOUS
  Filled 2024-06-23 (×15): qty 1

## 2024-06-23 MED ORDER — LACTATED RINGERS IV BOLUS
1000.0000 mL | Freq: Once | INTRAVENOUS | Status: AC
  Administered 2024-06-23: 1000 mL via INTRAVENOUS

## 2024-06-23 MED ORDER — ACETAMINOPHEN 325 MG PO TABS
650.0000 mg | ORAL_TABLET | Freq: Four times a day (QID) | ORAL | Status: DC | PRN
  Administered 2024-06-24 – 2024-06-29 (×2): 650 mg via ORAL
  Filled 2024-06-23 (×3): qty 2

## 2024-06-23 MED ORDER — MELATONIN 5 MG PO TABS
5.0000 mg | ORAL_TABLET | Freq: Every evening | ORAL | Status: DC | PRN
  Administered 2024-06-24 – 2024-06-28 (×5): 5 mg via ORAL
  Filled 2024-06-23 (×6): qty 1

## 2024-06-23 MED ORDER — SODIUM CHLORIDE 0.9 % IV SOLN: INTRAVENOUS | Status: AC

## 2024-06-23 MED ORDER — MAGNESIUM SULFATE 2 GM/50ML IV SOLN
2.0000 g | Freq: Once | INTRAVENOUS | Status: AC
  Administered 2024-06-23: 2 g via INTRAVENOUS
  Filled 2024-06-23: qty 50

## 2024-06-23 MED ORDER — CEFTRIAXONE SODIUM 1 G IJ SOLR
1.0000 g | Freq: Once | INTRAMUSCULAR | Status: AC
  Administered 2024-06-23: 1 g via INTRAVENOUS
  Filled 2024-06-23: qty 10

## 2024-06-23 MED ORDER — PROCHLORPERAZINE EDISYLATE 10 MG/2ML IJ SOLN
5.0000 mg | Freq: Four times a day (QID) | INTRAMUSCULAR | Status: DC | PRN
  Administered 2024-06-27: 5 mg via INTRAVENOUS
  Filled 2024-06-23: qty 2

## 2024-06-23 MED ORDER — LIDOCAINE VISCOUS HCL 2 % MT SOLN
15.0000 mL | Freq: Once | OROMUCOSAL | Status: AC
  Administered 2024-06-23: 15 mL via ORAL
  Filled 2024-06-23: qty 15

## 2024-06-23 MED ORDER — ALUM & MAG HYDROXIDE-SIMETH 200-200-20 MG/5ML PO SUSP
30.0000 mL | Freq: Once | ORAL | Status: AC
  Administered 2024-06-23: 30 mL via ORAL
  Filled 2024-06-23: qty 30

## 2024-06-23 MED ORDER — SODIUM CHLORIDE 0.9 % IV SOLN
1.0000 g | INTRAVENOUS | Status: DC
  Administered 2024-06-24 – 2024-06-26 (×4): 1 g via INTRAVENOUS
  Filled 2024-06-23 (×4): qty 10

## 2024-06-23 MED ORDER — FAMOTIDINE IN NACL 20-0.9 MG/50ML-% IV SOLN
20.0000 mg | Freq: Once | INTRAVENOUS | Status: AC
  Administered 2024-06-23: 20 mg via INTRAVENOUS
  Filled 2024-06-23: qty 50

## 2024-06-23 MED ORDER — POLYETHYLENE GLYCOL 3350 17 G PO PACK: 17.0000 g | PACK | Freq: Every day | ORAL | Status: DC | PRN

## 2024-06-23 MED ORDER — ONDANSETRON HCL 4 MG/2ML IJ SOLN
4.0000 mg | Freq: Once | INTRAMUSCULAR | Status: AC
Start: 2024-06-23 — End: 2024-06-23
  Administered 2024-06-23: 4 mg via INTRAVENOUS
  Filled 2024-06-23: qty 2
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
## 2024-06-23 NOTE — ED Triage Notes (Signed)
 Pt. Brought in via [facility] from [place] with c/o of nausea and not eating enough, Pt. Is able to drink but states that she cannot keep everything down; VSS per [facility]' Pt. Has no other complaints
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
## 2024-06-24 ENCOUNTER — Encounter (HOSPITAL_COMMUNITY): Payer: Self-pay | Admitting: Internal Medicine

## 2024-06-24 DIAGNOSIS — R1319 Other dysphagia: Secondary | ICD-10-CM | POA: Diagnosis not present

## 2024-06-24 DIAGNOSIS — R1314 Dysphagia, pharyngoesophageal phase: Secondary | ICD-10-CM | POA: Diagnosis not present

## 2024-06-24 DIAGNOSIS — N179 Acute kidney failure, unspecified: Secondary | ICD-10-CM | POA: Diagnosis not present

## 2024-06-24 DIAGNOSIS — N390 Urinary tract infection, site not specified: Secondary | ICD-10-CM

## 2024-06-24 DIAGNOSIS — K224 Dyskinesia of esophagus: Secondary | ICD-10-CM | POA: Diagnosis not present

## 2024-06-24 DIAGNOSIS — N1832 Chronic kidney disease, stage 3b: Secondary | ICD-10-CM

## 2024-06-24 DIAGNOSIS — R4189 Other symptoms and signs involving cognitive functions and awareness: Secondary | ICD-10-CM

## 2024-06-24 LAB — MAGNESIUM: Magnesium: 2.3 mg/dL (ref 1.7–2.4)

## 2024-06-24 LAB — CBC
HCT: 38.6 % (ref 36.0–46.0)
Hemoglobin: 12.1 g/dL (ref 12.0–15.0)
MCH: 29.4 pg (ref 26.0–34.0)
MCHC: 31.3 g/dL (ref 30.0–36.0)
MCV: 93.7 fL (ref 80.0–100.0)
Platelets: 339 K/uL (ref 150–400)
RBC: 4.12 MIL/uL (ref 3.87–5.11)
RDW: 13.5 % (ref 11.5–15.5)
WBC: 9 K/uL (ref 4.0–10.5)
nRBC: 0 % (ref 0.0–0.2)

## 2024-06-24 LAB — BASIC METABOLIC PANEL WITH GFR
Anion gap: 19 — ABNORMAL HIGH (ref 5–15)
BUN: 57 mg/dL — ABNORMAL HIGH (ref 8–23)
CO2: 18 mmol/L — ABNORMAL LOW (ref 22–32)
Calcium: 9.3 mg/dL (ref 8.9–10.3)
Chloride: 102 mmol/L (ref 98–111)
Creatinine, Ser: 1.5 mg/dL — ABNORMAL HIGH (ref 0.44–1.00)
GFR, Estimated: 34 mL/min — ABNORMAL LOW (ref >60)
Glucose, Bld: 90 mg/dL (ref 70–99)
Potassium: 3.5 mmol/L (ref 3.5–5.1)
Sodium: 139 mmol/L (ref 135–145)

## 2024-06-24 LAB — URINE CULTURE

## 2024-06-24 LAB — PHOSPHORUS: Phosphorus: 3.4 mg/dL (ref 2.5–4.6)

## 2024-06-24 MED ORDER — FLUTICASONE FUROATE-VILANTEROL 200-25 MCG/ACT IN AEPB
1.0000 | INHALATION_SPRAY | Freq: Every day | RESPIRATORY_TRACT | Status: DC
Start: 2024-06-24 — End: 2024-06-29
  Administered 2024-06-25 – 2024-06-29 (×4): 1 via RESPIRATORY_TRACT
  Filled 2024-06-24: qty 28

## 2024-06-24 MED ORDER — FLUOXETINE HCL 20 MG PO CAPS
40.0000 mg | ORAL_CAPSULE | Freq: Every morning | ORAL | Status: DC
  Administered 2024-06-25 – 2024-06-29 (×5): 40 mg via ORAL
  Filled 2024-06-24 (×5): qty 2

## 2024-06-24 MED ORDER — ROSUVASTATIN CALCIUM 5 MG PO TABS
5.0000 mg | ORAL_TABLET | Freq: Every morning | ORAL | Status: DC
  Administered 2024-06-25 – 2024-06-29 (×5): 5 mg via ORAL
  Filled 2024-06-24 (×5): qty 1

## 2024-06-24 NOTE — Plan of Care (Signed)
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
## 2024-06-24 NOTE — Progress Notes (Signed)
 PROGRESS NOTE  [name]    DOB: November 30, 1935, 88 y.o.  FMW:[id number]    Code Status: Full Code   DOA: 06/23/2024   LOS: 0   Brief hospital course  Deanna Acosta is a 88 y.o. female with a PMH significant for hypertension, hyperlipidemia, COPD, CKD 3B, hearing impairment, history of esophageal dysmotility without obstruction (status post dilatation), hiatal hernia seen on EGD done in 2019, recently admitted after left acetabulum fracture which was repaired, who presents to the ER from rehab with complaints of difficulty swallowing, nausea, spontaneous regurgitation without odynophagia, eating minimally, dysuria and increased urinary frequency.   ED Course: Temperature 97.4.  BP 103/46, pulse 65, respiration rate 27, O2 saturation 97% on room air.  Lab studies notable for serum bicarb 18, anion gap 15, magnesium  1.6, alkaline phosphatase 175, GFR 22.  WBC 12.1, neutrophil 8.4.. Mentation is intact.  Hypovolemic on exam.  IV fluid LR 1 L x 1, IV H2 receptor blocker, and IV antibiotics were initiated in the ER.  BUN 70, creatinine elevated 2.11 above baseline of 1.24.    Admitted for management of prerenal AKI with IV fluid hydration, swallow evaluation by speech therapist, and treatment of her UTI.  06/24/24 -stable, SLP evaluation pending  Assessment & Plan  Principal Problem:   AKI (acute kidney injury) (HCC)  Prerenal AKI in the setting of CKD 3B, secondary to dehydration from poor oral intake Cr returned to baseline   Dysphagia with history of esophageal dysmotility without obstruction Status post dilatation History of hiatal hernia Speech therapy evaluation recommend esophageal evaluation by GI. Negative for oropharyngeal dysphagia.  - GI consulted Dysphagia 3 diet with aspiration precautions   UTI, POA- without symptoms.  Follow urine culture for ID and sensitivities Continue Rocephin  empirically    Hypertension BPs are currently soft Hold off home oral  antihypertensives to avoid hypotension Maintain MAP greater than 65 Closely monitor vital signs   Hypomagnesemia- normalized after replacement   Recent left acetabulum surgery Prognosis from Dr. Layman op note 05/31/2024: PROGNOSIS:  [name] has sustained a complex injury and the repair has restored sufficient congruity and stability to the hip joint to enable bed-to-chair mobilization with touchdown weightbearing on the left lower extremity for the next 8 weeks and graduated weightbearing for the ensuing 4 weeks.  Formal pharmacologic DVT prophylaxis. There remains increased risk for perioperative complications given associated injuries, comorbidities, and age.  Body mass index is 27.4 kg/m.  VTE ppx: heparin  injection 5,000 Units Start: 06/23/24 2200  Diet:     Diet   DIET DYS 3 Room service appropriate? Yes; Fluid consistency: Thin   Consultants: GI  Subjective 06/24/24    Pt reports no complaints.    Objective  Blood pressure (!) 140/63, pulse 63, temperature 97.6 F (36.4 C), temperature source Oral, resp. rate 16, height 5' 4 (1.626 m), weight 72.4 kg, SpO2 95%. No intake or output data in the 24 hours ending 06/24/24 0734 Filed Weights   06/24/24 0117  Weight: 72.4 kg    Physical Exam:  General: awake, alert, NAD HEENT: atraumatic, clear conjunctiva, anicteric sclera, MMM, hearing grossly normal Respiratory: normal respiratory effort. Cardiovascular: quick capillary refill, normal S1/S2, RRR, no JVD, murmurs Gastrointestinal: soft, NT, ND Nervous: A&O x1. no gross focal neurologic deficits, normal speech Extremities: moves all equally, no edema, normal tone Skin: dry, intact, normal temperature, normal color. No rashes, lesions or ulcers on exposed skin Psychiatry: normal mood, congruent affect  Labs  I have personally reviewed the following labs and imaging studies CBC    Component Value Date/Time   WBC 9.0 06/24/2024 0428   RBC 4.12  06/24/2024 0428   HGB 12.1 06/24/2024 0428   HCT 38.6 06/24/2024 0428   PLT 339 06/24/2024 0428   MCV 93.7 06/24/2024 0428   MCH 29.4 06/24/2024 0428   MCHC 31.3 06/24/2024 0428   RDW 13.5 06/24/2024 0428   LYMPHSABS 2.4 06/23/2024 1637   MONOABS 1.0 06/23/2024 1637   EOSABS 0.2 06/23/2024 1637   BASOSABS 0.1 06/23/2024 1637      Latest Ref Rng & Units 06/24/2024    4:28 AM 06/23/2024    5:42 PM 06/04/2024   10:25 AM  BMP  Glucose 70 - 99 mg/dL 90  98  864   BUN 8 - 23 mg/dL 57  70  29   Creatinine 0.44 - 1.00 mg/dL 8.49  7.88  8.75   Sodium 135 - 145 mmol/L 139  135  137   Potassium 3.5 - 5.1 mmol/L 3.5  4.3  4.8   Chloride 98 - 111 mmol/L 102  102  107   CO2 22 - 32 mmol/L 18  18  19    Calcium  8.9 - 10.3 mg/dL 9.3  9.5  8.8     DG Chest Port 1 View Result Date: 06/23/2024 CLINICAL DATA:  difficulty swallowing EXAM: PORTABLE CHEST 1 VIEW COMPARISON:  Chest x-ray 08/28/2023 FINDINGS: Patient is rotated. The heart and mediastinal contours are unchanged. Atherosclerotic plaque. No focal consolidation. Chronic coarsened interstitial markings with no overt pulmonary edema. No pleural effusion. No pneumothorax. No acute osseous abnormality. IMPRESSION: 1. No active disease. 2.  Aortic Atherosclerosis (ICD10-I70.0). Electronically Signed   By: Morgane  Naveau M.D.   On: 06/23/2024 18:03    Disposition Plan & Communication  Patient status: Observation  Admitted From: SNF Planned disposition location: Skilled nursing facility Anticipated discharge date: 9/20 pending GI evaluation   Family Communication: daughter and husband at bedside     Author: Marien LITTIE Piety, DO [facility] 06/24/2024, 7:34 AM   Available by Epic secure chat 7AM-7PM. If 7PM-7AM, please contact night-coverage.  [facility] contact information found on [email or web address].

## 2024-06-24 NOTE — Progress Notes (Signed)
 PT Cancellation Note  Patient Details Name: Deanna Acosta MRN: [id number] DOB: [birth year]   Cancelled Treatment:    Reason Eval/Treat Not Completed: Patient declined, no reason specified - pt sleeping upon arrival to room, per pt's family at bedside pt has been more disoriented today and stating she doesn't want to be here (alive). PT to check back tomorrow.   Andrzej Scully S, PT DPT [facility] Services Secure Chat Preferred  Office [phone]    Deanna Acosta 06/24/2024, 2:12 PM

## 2024-06-24 NOTE — Care Management Obs Status (Signed)
 MEDICARE OBSERVATION STATUS NOTIFICATION   Patient Details  Name: Deanna Acosta MRN: [id number] Date of Birth: 1936/01/09   Medicare Observation Status Notification Given:  Yes  Obs notice signed and copy provided  Claretta Deed 06/24/2024, 4:23 PM
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
## 2024-06-24 NOTE — Plan of Care (Signed)
   Problem: Education: Goal: Knowledge of General Education information will improve Description Including pain rating scale, medication(s)/side effects and non-pharmacologic comfort measures Outcome: Progressing

## 2024-06-25 DIAGNOSIS — N179 Acute kidney failure, unspecified: Secondary | ICD-10-CM | POA: Diagnosis not present

## 2024-06-25 MED ORDER — TRAMADOL HCL 50 MG PO TABS
50.0000 mg | ORAL_TABLET | Freq: Four times a day (QID) | ORAL | Status: DC | PRN
  Administered 2024-06-25 – 2024-06-29 (×4): 50 mg via ORAL
  Filled 2024-06-25 (×4): qty 1

## 2024-06-25 MED ORDER — METOPROLOL SUCCINATE ER 50 MG PO TB24
100.0000 mg | ORAL_TABLET | Freq: Every day | ORAL | Status: DC
  Administered 2024-06-25 – 2024-06-29 (×5): 100 mg via ORAL
  Filled 2024-06-25 (×5): qty 2

## 2024-06-25 MED ORDER — DILTIAZEM HCL ER COATED BEADS 120 MG PO CP24
120.0000 mg | ORAL_CAPSULE | Freq: Every day | ORAL | Status: DC
  Administered 2024-06-25 – 2024-06-29 (×5): 120 mg via ORAL
  Filled 2024-06-25 (×5): qty 1

## 2024-06-25 MED ORDER — CALCIUM CARBONATE ANTACID 500 MG PO CHEW
1.0000 | CHEWABLE_TABLET | Freq: Three times a day (TID) | ORAL | Status: DC | PRN
  Administered 2024-06-25 – 2024-06-26 (×3): 200 mg via ORAL
  Filled 2024-06-25 (×3): qty 1

## 2024-06-25 MED ORDER — ALUM & MAG HYDROXIDE-SIMETH 200-200-20 MG/5ML PO SUSP
15.0000 mL | Freq: Four times a day (QID) | ORAL | Status: DC | PRN
  Administered 2024-06-25: 15 mL via ORAL
  Filled 2024-06-25: qty 30

## 2024-06-25 MED ORDER — ALUM & MAG HYDROXIDE-SIMETH 200-200-20 MG/5ML PO SUSP
15.0000 mL | Freq: Once | ORAL | Status: AC
Start: 2024-06-26 — End: 2024-06-25
  Administered 2024-06-25: 15 mL via ORAL
  Filled 2024-06-25: qty 30

## 2024-06-25 MED ORDER — VITAMIN D 25 MCG (1000 UNIT) PO TABS
1000.0000 [IU] | ORAL_TABLET | Freq: Every evening | ORAL | Status: DC
Start: 2024-06-25 — End: 2024-06-29
  Administered 2024-06-25 – 2024-06-28 (×4): 1000 [IU] via ORAL
  Filled 2024-06-25 (×4): qty 1

## 2024-06-25 NOTE — Evaluation (Signed)
 Physical Therapy Evaluation Patient Details Name: Deanna Acosta MRN: [id number] DOB: 09-28-1936 Today's Date: 06/25/2024  History of Present Illness  Pt is a 88 y.o. female admitted 9/18 from [facility] for nausea, difficulty swallowing, eating minimally and increased urinary frequency. Admitted for AKI and UTI. Recent hospitalization 05/2024 for L acetabular fx, s/p ORIF. PMH: HTN, HLD, COPD, osteoporosis  Clinical Impression  Pt admitted from SNF. Pt confused and speaking to persons not in the room, but overall she follows commands and is participatory with evaluation. Pt requesting to use the restroom and assist provided to roll and place bed pan and perform posterior peri care following bowel movement. Pt exiting towards right side of bed without physical assist. Performed squat pivot transfer to chair with modA via face to face technique. Pt with good activity tolerance throughout session. Patient will benefit from continued inpatient follow up therapy, <3 hours/day to address strengthening, transfer training, and pre gait.        If plan is discharge home, recommend the following: A lot of help with walking and/or transfers;A lot of help with bathing/dressing/bathroom   Can travel by private vehicle   No    Equipment Recommendations Hospital bed;Wheelchair (measurements PT)  Recommendations for Other Services       Functional Status Assessment Patient has had a recent decline in their functional status and demonstrates the ability to make significant improvements in function in a reasonable and predictable amount of time.     Precautions / Restrictions Precautions Precautions: Fall Restrictions Weight Bearing Restrictions Per Provider Order: Yes LLE Weight Bearing Per Provider Order: Touchdown weight bearing      Mobility  Bed Mobility Overal bed mobility: Needs Assistance Bed Mobility: Rolling, Supine to Sit Rolling: Min assist   Supine to sit: Contact guard      General bed mobility comments: Rolling to R/L to placed bed pan with minA, exiting towards R side of bed with bed rail and CGA    Transfers Overall transfer level: Needs assistance Equipment used: None Transfers: Bed to chair/wheelchair/BSC       Squat pivot transfers: Mod assist     General transfer comment: ModA for face to face transfer from bed to chair towards R    Ambulation/Gait                  Stairs            Wheelchair Mobility     Tilt Bed    Modified Rankin (Stroke Patients Only)       Balance Overall balance assessment: Needs assistance Sitting-balance support: Feet supported Sitting balance-Leahy Scale: Fair                                       Pertinent Vitals/Pain Pain Assessment Pain Assessment: Faces Faces Pain Scale: Hurts little more Pain Location: L hip Pain Descriptors / Indicators: Discomfort, Grimacing, Guarding Pain Intervention(s): Monitored during session, Repositioned    Home Living Family/patient expects to be discharged to:: Skilled nursing facility                        Prior Function Prior Level of Function : Needs assist             Mobility Comments: prior to acetabular fx, was ambulating without an assistive device. ADLs Comments: requiring assist for ADL's at [facility]  Extremity/Trunk Assessment   Upper Extremity Assessment Upper Extremity Assessment: Defer to OT evaluation    Lower Extremity Assessment Lower Extremity Assessment: RLE deficits/detail;LLE deficits/detail RLE Deficits / Details: At least 3/5 strength LLE Deficits / Details: At least 3/5 strength       Communication   Communication Communication: Impaired Factors Affecting Communication: Hearing impaired    Cognition Arousal: Alert Behavior During Therapy: WFL for tasks assessed/performed   PT - Cognitive impairments: Memory, Awareness, Safety/Judgement                       PT -  Cognition Comments: Pt confused, speaking to people not in room, asking therapist if I was married to her husband Following commands: Intact       Cueing Cueing Techniques: Verbal cues, Gestural cues, Tactile cues, Visual cues     General Comments      Exercises     Assessment/Plan    PT Assessment Patient needs continued PT services  PT Problem List Decreased strength;Decreased range of motion;Decreased activity tolerance;Decreased balance;Decreased mobility;Decreased knowledge of use of DME;Decreased safety awareness;Pain       PT Treatment Interventions DME instruction;Gait training;Stair training;Functional mobility training;Therapeutic activities;Therapeutic exercise;Balance training;Patient/family education;Wheelchair mobility training    PT Goals (Current goals can be found in the Care Plan section)  Acute Rehab PT Goals Patient Stated Goal: go home PT Goal Formulation: With patient Time For Goal Achievement: 07/09/24 Potential to Achieve Goals: Good    Frequency Min 2X/week     Co-evaluation               AM-PAC PT 6 Clicks Mobility  Outcome Measure Help needed turning from your back to your side while in a flat bed without using bedrails?: A Little Help needed moving from lying on your back to sitting on the side of a flat bed without using bedrails?: A Little Help needed moving to and from a bed to a chair (including a wheelchair)?: A Lot Help needed standing up from a chair using your arms (e.g., wheelchair or bedside chair)?: Total Help needed to walk in hospital room?: Total Help needed climbing 3-5 steps with a railing? : Total 6 Click Score: 11    End of Session   Activity Tolerance: Patient tolerated treatment well Patient left: in chair;with call bell/phone within reach;with chair alarm set Nurse Communication: Mobility status PT Visit Diagnosis: Pain;Difficulty in walking, not elsewhere classified (R26.2);Unsteadiness on feet (R26.81);Other  abnormalities of gait and mobility (R26.89) Pain - Right/Left: Left Pain - part of body: Hip    Time: 0907-0930 PT Time Calculation (min) (ACUTE ONLY): 23 min   Charges:   PT Evaluation $PT Eval Low Complexity: 1 Low   PT General Charges $$ ACUTE PT VISIT: 1 Visit         Aleck Daring, PT, DPT Acute Rehabilitation Services Office [phone]   Aleck ONEIDA Daring 06/25/2024, 9:46 AM

## 2024-06-25 NOTE — TOC Initial Note (Signed)
 Transition of Care [facility]) - Initial/Assessment Note    Patient Details  Name: Deanna Acosta MRN: [id number] Date of Birth: [birth year]  [facility] CM/SW Contact:    [name] GORMAN Kindle, LCSW Phone Number: 06/25/2024, 10:42 AM  Clinical Narrative:                 Patient admitted from [facility] for rehab. CSW confirmed with admissions that patient will require insurance approval for return.   Expected Discharge Plan: Skilled Nursing Facility Barriers to Discharge: English as a second language teacher, Continued Medical Work up   Patient Goals and CMS Choice            Expected Discharge Plan and Services In-house Referral: Clinical Social Work     Living arrangements for the past 2 months: Single Family Home                                      Prior Living Arrangements/Services Living arrangements for the past 2 months: Single Family Home Lives with:: Spouse Patient language and need for interpreter reviewed:: Yes Do you feel safe going back to the place where you live?: Yes      Need for Family Participation in Patient Care: Yes (Comment) Care giver support system in place?: Yes (comment)   Criminal Activity/Legal Involvement Pertinent to Current Situation/Hospitalization: No - Comment as needed  Activities of Daily Living   ADL Screening (condition at time of admission) Independently performs ADLs?: No Does the patient have a NEW difficulty with bathing/dressing/toileting/self-feeding that is expected to last >3 days?: Yes (Initiates electronic notice to provider for possible OT consult) Does the patient have a NEW difficulty with getting in/out of bed, walking, or climbing stairs that is expected to last >3 days?: Yes (Initiates electronic notice to provider for possible PT consult) Does the patient have a NEW difficulty with communication that is expected to last >3 days?: Yes (Initiates electronic notice to provider for possible SLP consult) Is the  patient deaf or have difficulty hearing?: Yes Does the patient have difficulty seeing, even when wearing glasses/contacts?: No Does the patient have difficulty concentrating, remembering, or making decisions?: No  Permission Sought/Granted Permission sought to share information with : Facility Medical sales representative, Family Supports Permission granted to share information with : No  Share Information with NAME: Claudene Prom- Daughter   [phone]           Emotional Assessment Appearance:: Appears stated age Attitude/Demeanor/Rapport: Unable to Assess Affect (typically observed): Unable to Assess Orientation: : Oriented to Self, Oriented to Place Alcohol / Substance Use: Not Applicable Psych Involvement: No (comment)  Admission diagnosis:  Nausea [R11.0] Acute cystitis without hematuria [N30.00] AKI (acute kidney injury) (HCC) [N17.9] Dysphagia, unspecified type [R13.10] Patient Active Problem List   Diagnosis Date Noted   Malnutrition of moderate degree 06/02/2024   Left acetabular fracture (HCC) 05/31/2024   Closed fracture of left inferior pubic ramus (HCC) 05/31/2024   Fall at home, initial encounter 05/31/2024   Leukocytosis 05/31/2024   Hyperlipidemia 05/31/2024   GERD (gastroesophageal reflux disease) 05/31/2024   COVID-19 virus infection 12/23/2021   Acute respiratory failure with hypoxia (HCC) 12/22/2021   AKI (acute kidney injury) (HCC) 01/22/2021   S/P lumbar laminectomy 11/29/2018   Dysphagia    Gastroesophageal reflux disease with esophagitis    Foreign body in esophagus    Esophageal dysmotility 12/13/2017   Aspiration pneumonia due to vomit (HCC)  12/13/2017   Status post total knee replacement, right 12/11/2017   Varicose veins of bilateral lower extremities with other complications 12/08/2016   Influenza A 10/21/2016   COPD (chronic obstructive pulmonary disease) (HCC) 09/26/2016   Headache 09/24/2012   COPD exacerbation (HCC) 09/23/2012   Hypoxemia  09/23/2012   Dehydration 09/23/2012   HTN (hypertension) 09/23/2012   Chronic kidney disease, stage III (moderate) (HCC) 09/23/2012   PCP:  Cleotilde Planas, MD Pharmacy:   [facility] [address], [place] - [address] [place] [address] Phone: [phone] Fax: [phone]  [facility] [address] [place] [address] Phone: [phone] Fax: [phone]  [facility] [place], [place] - [address] [place] [address] Phone: [phone] Fax: [phone]     Social Drivers of Health (SDOH) Social History: SDOH Screenings   Food Insecurity: No Food Insecurity (06/24/2024)  Housing: Low Risk  (06/24/2024)  Transportation Needs: No Transportation Needs (06/24/2024)  Utilities: Not At Risk (06/24/2024)  Social Connections: Moderately Integrated (06/24/2024)  Tobacco Use: Medium Risk (06/24/2024)   SDOH Interventions:     Readmission Risk Interventions     No data to display

## 2024-06-25 NOTE — Progress Notes (Signed)
 PROGRESS NOTE  ESTEPHANIE HUBBS    DOB: 1936-09-14, 88 y.o.  FMW:[id number]    Code Status: Full Code   DOA: 06/23/2024   LOS: 0   Brief hospital course  Deanna Acosta is a 88 y.o. female with a PMH significant for hypertension, hyperlipidemia, COPD, CKD 3B, hearing impairment, history of esophageal dysmotility without obstruction (status post dilatation), hiatal hernia seen on EGD done in 2019, recently admitted after left acetabulum fracture which was repaired, who presents to the ER from rehab with complaints of difficulty swallowing, nausea, spontaneous regurgitation without odynophagia, eating minimally, dysuria and increased urinary frequency.   ED Course: Temperature 97.4.  BP 103/46, pulse 65, respiration rate 27, O2 saturation 97% on room air.  Lab studies notable for serum bicarb 18, anion gap 15, magnesium  1.6, alkaline phosphatase 175, GFR 22.  WBC 12.1, neutrophil 8.4.. Mentation is intact.  Hypovolemic on exam.  IV fluid LR 1 L x 1, IV H2 receptor blocker, and IV antibiotics were initiated in the ER.  BUN 70, creatinine elevated 2.11 above baseline of 1.24.    Admitted for management of prerenal AKI with IV fluid hydration, swallow evaluation by speech therapist, and treatment of her UTI.  06/25/24 -stable, SLP evaluation pending  Assessment & Plan  Principal Problem:   AKI (acute kidney injury) (HCC)  Prerenal AKI in the setting of CKD 3B, secondary to dehydration from poor oral intake Cr returned to baseline   Dysphagia with history of esophageal dysmotility without obstruction Status post dilatation History of hiatal hernia Patient denies any odynophagia or difficulty swallowing today. Evaluated by GI, planning for barium swallow study. Continue to hold full dose aspirin .  AKI on CKD Creatinine improved to 1.5 from 2.11.  Will recheck creatinine tomorrow. Baseline is about 1.3 Continue to hold hydrochlorothiazide   UTI, POA- without symptoms.  Follow urine  culture for ID and sensitivities Continue Rocephin  empirically    Hypertension BP soft on admission. This has improved.  Will resume Cardizem  and metoprolol .  Continue to hold hydrochlorothiazide  due to AKI. Hypomagnesemia- normalized after replacement   Recent left acetabulum surgery Prognosis from Dr. Layman op note 05/31/2024: PROGNOSIS:  Deanna Acosta has sustained a complex injury and the repair has restored sufficient congruity and stability to the hip joint to enable bed-to-chair mobilization with touchdown weightbearing on the left lower extremity for the next 8 weeks and graduated weightbearing for the ensuing 4 weeks.  Formal pharmacologic DVT prophylaxis. There remains increased risk for perioperative complications given associated injuries, comorbidities, and age.  Body mass index is 27.4 kg/m.  VTE ppx: heparin  injection 5,000 Units Start: 06/23/24 2200  Diet:     Diet   DIET DYS 2 Room service appropriate? Yes with Assist; Fluid consistency: Thin   Consultants: GI  Subjective 06/25/24    Pt reports no complaints.    Objective  Blood pressure (!) 140/63, pulse 63, temperature 97.6 F (36.4 C), temperature source Oral, resp. rate 16, height 5' 4 (1.626 m), weight 72.4 kg, SpO2 95%.  Intake/Output Summary (Last 24 hours) at 06/25/2024 1149 Last data filed at 06/24/2024 1946 Gross per 24 hour  Intake 100 ml  Output 800 ml  Net -700 ml   Filed Weights   06/24/24 0117  Weight: 72.4 kg    Physical Exam:  General: awake, alert, NAD HEENT: atraumatic, clear conjunctiva, anicteric sclera, MMM, hearing grossly normal Respiratory: normal respiratory effort. Cardiovascular: quick capillary refill, normal S1/S2, RRR, no JVD, murmurs Gastrointestinal: soft, NT,  ND Nervous: A&O x1. no gross focal neurologic deficits, normal speech Extremities: moves all equally, no edema, normal tone Skin: dry, intact, normal temperature, normal color. No rashes, lesions  or ulcers on exposed skin Psychiatry: normal mood, congruent affect  Labs   I have personally reviewed the following labs and imaging studies CBC    Component Value Date/Time   WBC 9.0 06/24/2024 0428   RBC 4.12 06/24/2024 0428   HGB 12.1 06/24/2024 0428   HCT 38.6 06/24/2024 0428   PLT 339 06/24/2024 0428   MCV 93.7 06/24/2024 0428   MCH 29.4 06/24/2024 0428   MCHC 31.3 06/24/2024 0428   RDW 13.5 06/24/2024 0428   LYMPHSABS 2.4 06/23/2024 1637   MONOABS 1.0 06/23/2024 1637   EOSABS 0.2 06/23/2024 1637   BASOSABS 0.1 06/23/2024 1637      Latest Ref Rng & Units 06/24/2024    4:28 AM 06/23/2024    5:42 PM 06/04/2024   10:25 AM  BMP  Glucose 70 - 99 mg/dL 90  98  864   BUN 8 - 23 mg/dL 57  70  29   Creatinine 0.44 - 1.00 mg/dL 8.49  7.88  8.75   Sodium 135 - 145 mmol/L 139  135  137   Potassium 3.5 - 5.1 mmol/L 3.5  4.3  4.8   Chloride 98 - 111 mmol/L 102  102  107   CO2 22 - 32 mmol/L 18  18  19    Calcium  8.9 - 10.3 mg/dL 9.3  9.5  8.8     DG Chest Port 1 View Result Date: 06/23/2024 CLINICAL DATA:  difficulty swallowing EXAM: PORTABLE CHEST 1 VIEW COMPARISON:  Chest x-ray 08/28/2023 FINDINGS: Patient is rotated. The heart and mediastinal contours are unchanged. Atherosclerotic plaque. No focal consolidation. Chronic coarsened interstitial markings with no overt pulmonary edema. No pleural effusion. No pneumothorax. No acute osseous abnormality. IMPRESSION: 1. No active disease. 2.  Aortic Atherosclerosis (ICD10-I70.0). Electronically Signed   By: Morgane  Naveau M.D.   On: 06/23/2024 18:03    Disposition Plan & Communication  Patient status: Observation  Admitted From: SNF Planned disposition location: Skilled nursing facility Anticipated discharge date: 9/20 pending GI evaluation   Family Communication: daughter and husband at bedside     Author: Dean Goldner, DO [facility] 06/25/2024, 11:49 AM   Available by Epic secure chat 7AM-7PM. If 7PM-7AM, please  contact night-coverage.  [facility] contact information found on [email or web address].

## 2024-06-25 NOTE — Evaluation (Signed)
 Occupational Therapy Evaluation Patient Details Name: Deanna Acosta MRN: [id number] DOB: Feb 26, 1936 Today's Date: 06/25/2024   History of Present Illness   Pt is a 88 y.o. female admitted 9/18 from [facility] for nausea, difficulty swallowing, eating minimally and increased urinary frequency. Admitted for AKI and UTI. Recent hospitalization 05/2024 for L acetabular fx, s/p ORIF. PMH: HTN, HLD, COPD, osteoporosis     Clinical Impressions Prior to admission patient was at [facility] for rehab and requiring assist with all ADLS as she is currently TTWB LLE and having periods of confusion.  Currently she still Max A with BADLS and her confusion is persisting per the family.  They have stated they want to take her home with home health and have obtained all the necessary DME, have 24/7 and are already in contact with Home Health.  Recommending home health OT     If plan is discharge home, recommend the following:   A lot of help with walking and/or transfers;A lot of help with bathing/dressing/bathroom;Assistance with cooking/housework;Direct supervision/assist for medications management;Assist for transportation;Help with stairs or ramp for entrance;Supervision due to cognitive status     Functional Status Assessment   Patient has had a recent decline in their functional status and demonstrates the ability to make significant improvements in function in a reasonable and predictable amount of time.     Equipment Recommendations   None recommended by OT     Recommendations for Other Services         Precautions/Restrictions   Precautions Precautions: Fall Recall of Precautions/Restrictions: Impaired Restrictions Weight Bearing Restrictions Per Provider Order: Yes LLE Weight Bearing Per Provider Order: Touchdown weight bearing     Mobility Bed Mobility Overal bed mobility: Needs Assistance                  Transfers Overall transfer level: Needs  assistance                 General transfer comment: Patient had just transferred to the chair with PT      Balance Overall balance assessment: Needs assistance Sitting-balance support: Feet supported Sitting balance-Leahy Scale: Fair                                     ADL either performed or assessed with clinical judgement   ADL Overall ADL's : Needs assistance/impaired Eating/Feeding: Set up;Sitting   Grooming: Set up;Sitting   Upper Body Bathing: Minimal assistance;Sitting   Lower Body Bathing: Maximal assistance;Bed level   Upper Body Dressing : Minimal assistance;Sitting   Lower Body Dressing: Maximal assistance;Bed level   Toilet Transfer: Maximal assistance (bed level with bed pan)                   Vision Baseline Vision/History: 1 Wears glasses Ability to See in Adequate Light: 0 Adequate Patient Visual Report: No change from baseline Vision Assessment?: No apparent visual deficits     Perception         Praxis         Pertinent Vitals/Pain Pain Assessment Pain Assessment: Faces Faces Pain Scale: Hurts little more Pain Location: L hip Pain Descriptors / Indicators: Discomfort, Grimacing, Guarding Pain Intervention(s): Monitored during session     Extremity/Trunk Assessment Upper Extremity Assessment Upper Extremity Assessment: Overall WFL for tasks assessed RUE Deficits / Details: generalized weakness; ROM WFL; coordination WFL; sensation WFL LUE Deficits / Details: generalized weakness; pt  with abrasion on pad of palm near thumb secondary to fall leading to this admission; pt reporting pain in shoulder with AROM since fall leading to this admission, imaging this admission showed no fx; ROM WFL; coordination WFL; sensation WFL   Lower Extremity Assessment Lower Extremity Assessment: Defer to PT evaluation RLE Deficits / Details: At least 3/5 strength LLE Deficits / Details: At least 3/5 strength        Communication Communication Communication: Impaired Factors Affecting Communication: Hearing impaired   Cognition Arousal: Alert Behavior During Therapy: WFL for tasks assessed/performed Cognition: History of cognitive impairments             OT - Cognition Comments: Alert and oriented to person, place and situation.  However she has periods of confusion.  Per family this happened after the fall and she is confused about 50 % of the time. She is easily redirectble.                 Following commands: Intact       Cueing  General Comments   Cueing Techniques: Verbal cues;Gestural cues;Tactile cues;Visual cues  Family in the room   Exercises     Shoulder Instructions      Home Living Family/patient expects to be discharged to:: Private residence Living Arrangements: Spouse/significant other;Children;Other relatives Available Help at Discharge: Family;Available 24 hours/day Type of Home: House Home Access: Stairs to enter;Other (comment) (Family has built a ramp)           Bathroom Shower/Tub: Walk-in shower;Tub only   Bathroom Toilet: Standard Bathroom Accessibility: Yes   Home Equipment: Cane - single point;Rolling Walker (2 wheels);Shower seat;BSC/3in1;Grab bars - tub/shower;Hand held shower head;Wheelchair - manual      Lives With: Spouse    Prior Functioning/Environment Prior Level of Function : Needs assist (Prior to fall, patient was independent and not confused.  Even bowling)  Cognitive Assist : ADLs (cognitive)     Physical Assist : ADLs (physical)     Mobility Comments: prior to acetabular fx, was ambulating without an assistive device. ADLs Comments: requiring assist for ADL's at SNF    OT Problem List: Decreased strength;Decreased activity tolerance;Impaired balance (sitting and/or standing);Decreased knowledge of use of DME or AE;Decreased knowledge of precautions;Pain;Decreased cognition   OT Treatment/Interventions: Self-care/ADL  training;DME and/or AE instruction;Patient/family education      OT Goals(Current goals can be found in the care plan section)   Acute Rehab OT Goals Patient Stated Goal: To go home OT Goal Formulation: With patient/family Time For Goal Achievement: 07/09/24 Potential to Achieve Goals: Good   OT Frequency:  Min 2X/week    Co-evaluation       OT goals addressed during session: ADL's and self-care      AM-PAC OT 6 Clicks Daily Activity     Outcome Measure Help from another person eating meals?: A Little Help from another person taking care of personal grooming?: A Little Help from another person toileting, which includes using toliet, bedpan, or urinal?: A Lot Help from another person bathing (including washing, rinsing, drying)?: A Lot Help from another person to put on and taking off regular upper body clothing?: A Little Help from another person to put on and taking off regular lower body clothing?: Total 6 Click Score: 14   End of Session Nurse Communication: Other (comment) (Discharge request to go home)  Activity Tolerance: Patient tolerated treatment well Patient left: in chair;with call bell/phone within reach;with chair alarm set;with family/visitor present  OT Visit Diagnosis:  Unsteadiness on feet (R26.81);Other abnormalities of gait and mobility (R26.89);History of falling (Z91.81);Pain;Other symptoms and signs involving cognitive function Pain - Right/Left: Left Pain - part of body: Leg;Shoulder;Hip                Time: 0933-1000 OT Time Calculation (min): 27 min Charges:  OT General Charges $OT Visit: 1 Visit OT Evaluation $OT Eval Moderate Complexity: 1 Mod OT Treatments $Self Care/Home Management : 8-22 mins  [name] Browner OTR/L   [name] SHAUNNA Browner 06/25/2024, 10:34 AM

## 2024-06-25 NOTE — TOC CM/SW Note (Addendum)
 Per [name], OT, family want to bring pt home and are refusing for her to go back to [facility]. They can provide 24/7 care and the have all the DME needed. Met with pt, husband and 2 daughters. The DC plan is for pt to return home with the support of the family. Daughter reports that she lives nearby. She reports that [place] arranged HH with [facility] and they also arranged the DME (WC and RW). Pt has a 3-in-1 BSC. Daughter reports that they can provide transportation at time of DC. Contacted Glenda at [facility] and she reports that they accepted the referral from [facility] PT/OT. RN and MD notified of the DC plan.

## 2024-06-26 DIAGNOSIS — N179 Acute kidney failure, unspecified: Secondary | ICD-10-CM | POA: Diagnosis not present

## 2024-06-26 LAB — PREALBUMIN: Prealbumin: 18 mg/dL (ref 18–38)

## 2024-06-26 MED ORDER — ASPIRIN 325 MG PO TABS
325.0000 mg | ORAL_TABLET | Freq: Every evening | ORAL | Status: DC
Start: 2024-06-26 — End: 2024-06-29
  Administered 2024-06-26 – 2024-06-28 (×3): 325 mg via ORAL
  Filled 2024-06-26 (×5): qty 1

## 2024-06-26 MED ORDER — ENSURE PLUS HIGH PROTEIN PO LIQD
237.0000 mL | Freq: Three times a day (TID) | ORAL | Status: DC
  Administered 2024-06-26 – 2024-06-28 (×4): 237 mL via ORAL

## 2024-06-26 NOTE — Progress Notes (Addendum)
 Daily Progress Note  DOA: 06/23/2024 Hospital Day: 4  Cc: Dysphagia  ASSESSMENT    88 year old female with longstanding intermittent solid food dysphagia secondary to severe esophageal dysmotility.  Now with worsening symptoms over the last couple of weeks resulting in marked decrease in oral intake Progressive dysphagia possibly secondary to worsening esophageal dysmotility. Given similar presentations  / findings on prior EGDs, a strictures seems less likely.    AKI on CKD3b Secondary to dehydration related to poor p.o. intake Creatinine has returned to baseline   Recent left acetabulum surgery   UTI Urine cultures -  mixed growth ( ? Contamination) On empiric antibiotics   Hypertension  Principal Problem:   AKI (acute kidney injury) (HCC)   PLAN   --Based on what patient and husband tell me it sounds like she has had almost no PO intake in days. She is tolerating liquids. Will add Ensure TID --Prealbumin --Awaiting barium swallow   Subjective   Tolerating liquids, maybe an occasional potato chip.   Objective    Recent Labs    06/23/24 1637 06/24/24 0428  WBC 12.1* 9.0  HGB 14.0 12.1  HCT 43.7 38.6  MCV 92.2 93.7  PLT 456* 339   No results for input(s): FOLATE, VITAMINB12, FERRITIN, TIBC, IRONPCTSAT in the last 72 hours. Recent Labs    06/23/24 1742 06/24/24 0428  NA 135 139  K 4.3 3.5  CL 102 102  CO2 18* 18*  GLUCOSE 98 90  BUN 70* 57*  CREATININE 2.11* 1.50*  CALCIUM  9.5 9.3   Recent Labs    06/23/24 1742  PROT 6.9  ALBUMIN  3.2*  AST 27  ALT 11  ALKPHOS 175*  BILITOT 0.4     Imaging:  DG Chest Port 1 View CLINICAL DATA:  difficulty swallowing  EXAM: PORTABLE CHEST 1 VIEW  COMPARISON:  Chest x-ray 08/28/2023  FINDINGS: Patient is rotated.  The heart and mediastinal contours are unchanged. Atherosclerotic plaque.  No focal consolidation. Chronic coarsened interstitial markings with no overt pulmonary  edema. No pleural effusion. No pneumothorax.  No acute osseous abnormality.  IMPRESSION: 1. No active disease. 2.  Aortic Atherosclerosis (ICD10-I70.0).  Electronically Signed   By: Morgane  Naveau M.D.   On: 06/23/2024 18:03     Scheduled inpatient medications:   aspirin   325 mg Oral QPM   cholecalciferol   1,000 Units Oral QPM   diltiazem   120 mg Oral Daily   FLUoxetine   40 mg Oral q AM   fluticasone  furoate-vilanterol  1 puff Inhalation Daily   heparin   5,000 Units Subcutaneous Q8H   metoprolol  succinate  100 mg Oral Daily   rosuvastatin   5 mg Oral q AM   Continuous inpatient infusions:   cefTRIAXone  (ROCEPHIN )  IV 1 g (06/25/24 2351)   PRN inpatient medications: acetaminophen , calcium  carbonate, melatonin, polyethylene glycol, prochlorperazine , traMADol   Vital signs in last 24 hours: Temp:  [97.5 F (36.4 C)-98 F (36.7 C)] 97.7 F (36.5 C) (09/21 1212) Pulse Rate:  [62-71] 66 (09/21 1212) Resp:  [16-18] 17 (09/21 1212) BP: (134-155)/(56-85) 140/71 (09/21 1212) SpO2:  [94 %-100 %] 100 % (09/21 1212) Last BM Date : 06/26/24  Intake/Output Summary (Last 24 hours) at 06/26/2024 1429 Last data filed at 06/25/2024 2000 Gross per 24 hour  Intake 100 ml  Output --  Net 100 ml    Intake/Output from previous day: 09/20 0701 - 09/21 0700 In: 100 [IV Piggyback:100] Out: -  Intake/Output this shift: No intake/output data recorded.  Physical Exam:  General: Alert female in NAD Heart:  Regular rate .  Pulmonary: Normal respiratory effort Abdomen: Soft, nondistended, nontender. Normal bowel sounds. Extremities: No lower extremity edema  Neurologic: Alert and oriented Psych: Pleasant. Cooperative     LOS: 0 days   Vina Dasen ,NP 06/26/2024, 2:29 PM   --------------------------------------------------------------------------  I did not see the patient today.  Will await barium swallow results.  Reassuringly, prealbumin levels within normal range.  High  degree of suspicion for significant esophageal dysmotility disorder such as achalasia.    Dr. Albertus will be taking over inpatient GI services tomorrow.

## 2024-06-26 NOTE — Plan of Care (Signed)
  Problem: Education: Goal: Knowledge of General Education information will improve Description: Including pain rating scale, medication(s)/side effects and non-pharmacologic comfort measures Outcome: Not Progressing   Problem: Health Behavior/Discharge Planning: Goal: Ability to manage health-related needs will improve Outcome: Not Progressing   Problem: Coping: Goal: Level of anxiety will decrease Outcome: Not Progressing   Problem: Elimination: Goal: Will not experience complications related to bowel motility Outcome: Not Progressing   Problem: Clinical Measurements: Goal: Ability to maintain clinical measurements within normal limits will improve Outcome: Progressing Goal: Will remain free from infection Outcome: Progressing Goal: Diagnostic test results will improve Outcome: Progressing Goal: Respiratory complications will improve Outcome: Progressing Goal: Cardiovascular complication will be avoided Outcome: Progressing   Problem: Activity: Goal: Risk for activity intolerance will decrease Outcome: Progressing   Problem: Nutrition: Goal: Adequate nutrition will be maintained Outcome: Progressing   Problem: Elimination: Goal: Will not experience complications related to urinary retention Outcome: Progressing   Problem: Pain Managment: Goal: General experience of comfort will improve and/or be controlled Outcome: Progressing   Problem: Safety: Goal: Ability to remain free from injury will improve Outcome: Progressing   Problem: Skin Integrity: Goal: Risk for impaired skin integrity will decrease Outcome: Progressing

## 2024-06-26 NOTE — Plan of Care (Signed)

## 2024-06-26 NOTE — Progress Notes (Signed)
 PROGRESS NOTE  RAYNESHA TIEDT    DOB: March 09, 1936, 88 y.o.  FMW:[id number]    Code Status: Full Code   DOA: 06/23/2024   LOS: 0   Brief hospital course  NATALE BARBA is a 88 y.o. female with a PMH significant for hypertension, hyperlipidemia, COPD, CKD 3B, hearing impairment, history of esophageal dysmotility without obstruction (status post dilatation), hiatal hernia seen on EGD done in 2019, recently admitted after left acetabulum fracture which was repaired, who presents to the ER from rehab with complaints of difficulty swallowing, nausea, spontaneous regurgitation without odynophagia, eating minimally, dysuria and increased urinary frequency.   ED Course: Temperature 97.4.  BP 103/46, pulse 65, respiration rate 27, O2 saturation 97% on room air.  Lab studies notable for serum bicarb 18, anion gap 15, magnesium  1.6, alkaline phosphatase 175, GFR 22.  WBC 12.1, neutrophil 8.4.. Mentation is intact.  Hypovolemic on exam.  IV fluid LR 1 L x 1, IV H2 receptor blocker, and IV antibiotics were initiated in the ER.  BUN 70, creatinine elevated 2.11 above baseline of 1.24.    Admitted for management of prerenal AKI with IV fluid hydration, swallow evaluation by speech therapist, and treatment of her UTI.   Assessment & Plan  Principal Problem:   AKI (acute kidney injury) (HCC)  Prerenal AKI in the setting of CKD 3B, secondary to dehydration from poor oral intake Cr returned to baseline   Dysphagia with history of esophageal dysmotility without obstruction Status post dilatation History of hiatal hernia Patient denies any odynophagia or difficulty swallowing today. Evaluated by GI, planning for barium swallow study.(Anticipate Monday) resume aspirin .  AKI on CKD Creatinine improved to 1.5 from 2.11.  Baseline is about 1.3 Continue to hold hydrochlorothiazide   UTI, POA- without symptoms.  Urine culture with mixed growth questionable significance Continue Rocephin  empirically     Hypertension BP soft on admission. This has improved.  Will resume Cardizem  and metoprolol .  Continue to hold hydrochlorothiazide  due to AKI.  Hypomagnesemia- normalized after replacement   Recent left acetabulum surgery Prognosis from Dr. Layman op note 05/31/2024: PROGNOSIS:  Dezra G Duty has sustained a complex injury and the repair has restored sufficient congruity and stability to the hip joint to enable bed-to-chair mobilization with touchdown weightbearing on the left lower extremity for the next 8 weeks and graduated weightbearing for the ensuing 4 weeks.  Formal pharmacologic DVT prophylaxis. There remains increased risk for perioperative complications given associated injuries, comorbidities, and age.  Body mass index is 27.4 kg/m.  VTE ppx: heparin  injection 5,000 Units Start: 06/23/24 2200  Diet:     Diet   DIET DYS 2 Room service appropriate? Yes with Assist; Fluid consistency: Thin   Consultants: GI  Subjective 06/26/24    Pt reports no complaints.    Objective  Blood pressure (!) 140/63, pulse 63, temperature 97.6 F (36.4 C), temperature source Oral, resp. rate 16, height 5' 4 (1.626 m), weight 72.4 kg, SpO2 95%.  Intake/Output Summary (Last 24 hours) at 06/26/2024 1225 Last data filed at 06/25/2024 2000 Gross per 24 hour  Intake 100 ml  Output --  Net 100 ml   Filed Weights   06/24/24 0117  Weight: 72.4 kg    Physical Exam:  General: awake, alert, NAD, confused conversation. HEENT: atraumatic, clear conjunctiva, anicteric sclera, MMM, hearing grossly normal Respiratory: normal respiratory effort. Cardiovascular: quick capillary refill, normal S1/S2, RRR, no JVD, murmurs Gastrointestinal: soft, NT, ND Nervous: A&O x1. no gross focal neurologic deficits, normal  speech Extremities: moves all equally, no edema, normal tone Skin: dry, intact, normal temperature, normal color. No rashes, lesions or ulcers on exposed skin Psychiatry: normal  mood, congruent affect  Labs   I have personally reviewed the following labs and imaging studies CBC    Component Value Date/Time   WBC 9.0 06/24/2024 0428   RBC 4.12 06/24/2024 0428   HGB 12.1 06/24/2024 0428   HCT 38.6 06/24/2024 0428   PLT 339 06/24/2024 0428   MCV 93.7 06/24/2024 0428   MCH 29.4 06/24/2024 0428   MCHC 31.3 06/24/2024 0428   RDW 13.5 06/24/2024 0428   LYMPHSABS 2.4 06/23/2024 1637   MONOABS 1.0 06/23/2024 1637   EOSABS 0.2 06/23/2024 1637   BASOSABS 0.1 06/23/2024 1637      Latest Ref Rng & Units 06/24/2024    4:28 AM 06/23/2024    5:42 PM 06/04/2024   10:25 AM  BMP  Glucose 70 - 99 mg/dL 90  98  864   BUN 8 - 23 mg/dL 57  70  29   Creatinine 0.44 - 1.00 mg/dL 8.49  7.88  8.75   Sodium 135 - 145 mmol/L 139  135  137   Potassium 3.5 - 5.1 mmol/L 3.5  4.3  4.8   Chloride 98 - 111 mmol/L 102  102  107   CO2 22 - 32 mmol/L 18  18  19    Calcium  8.9 - 10.3 mg/dL 9.3  9.5  8.8     No results found.   Disposition Plan & Communication  Patient status: Observation  Admitted From: SNF Planned disposition location: Skilled nursing facility Anticipated discharge date: 9/22 pending GI evaluation   Family Communication: daughter and husband at bedside     Author: Naziah Portee, DO [facility] 06/26/2024, 12:25 PM   Available by Epic secure chat 7AM-7PM. If 7PM-7AM, please contact night-coverage.  [facility] contact information found on [email or web address].

## 2024-06-26 NOTE — Progress Notes (Signed)
 Physical Therapy Treatment Patient Details Name: Deanna Acosta MRN: [id number] DOB: 1935/11/30 Today's Date: 06/26/2024   History of Present Illness Pt is a 88 y.o. female admitted 9/18 from [facility] for nausea, difficulty swallowing, eating minimally and increased urinary frequency. Admitted for AKI and UTI. Recent hospitalization 05/2024 for L acetabular fx, s/p ORIF. PMH: HTN, HLD, COPD, osteoporosis   PT Comments  Pt in bed upon arrival with husband present and supportive during session. Focused session on safety with transfers and exercises upon returning home. After demonstration, pt's husband was able to demonstrate safety with a squat-pivot and stand-pivot to the WC. Educated on use of gait belt and how to prop L LE to follow WB precautions. Multiple repetitions performed with pt's husband feeling comfortable assisting at home. Pt performed HEP with written handout given at end of session. Recommending HHPT upon d/c with 24/7 assist. Acute PT to follow.     If plan is discharge home, recommend the following: A lot of help with walking and/or transfers;A lot of help with bathing/dressing/bathroom   Can travel by private vehicle     No  Equipment Recommendations  Hospital bed;Wheelchair (measurements PT)       Precautions / Restrictions Precautions Precautions: Fall Recall of Precautions/Restrictions: Impaired Restrictions Weight Bearing Restrictions Per Provider Order: Yes LLE Weight Bearing Per Provider Order: Touchdown weight bearing     Mobility  Bed Mobility Overal bed mobility: Needs Assistance Bed Mobility: Supine to Sit    Supine to sit: Min assist    General bed mobility comments: MinA to assist with LE management. Cues to scoot forwards towards EOB. Husband assisting with PT close by    Transfers Overall transfer level: Needs assistance Equipment used: None Transfers: Bed to chair/wheelchair/BSC  Squat pivot transfers: Mod assist    General transfer  comment: ModA for face to face transfer from bed to chair towards R. Assist to prop L LE to avoid WB. Husband able to demonstrate stand-pivot and squat-pivot safely    Ambulation/Gait    General Gait Details: Unable      Balance Overall balance assessment: Needs assistance Sitting-balance support: Feet supported Sitting balance-Leahy Scale: Fair     Hotel manager: Impaired Factors Affecting Communication: Hearing impaired  Cognition Arousal: Alert Behavior During Therapy: WFL for tasks assessed/performed   PT - Cognitive impairments: Memory, Awareness, Safety/Judgement    PT - Cognition Comments: Rambling speech with frequent redirection to task at hand Following commands: Intact      Cueing Cueing Techniques: Verbal cues, Gestural cues, Tactile cues, Visual cues  Exercises Other Exercises Other Exercises: HEP #[id number] (LAQ, seated marches, glute set, quad set, ankle pump, and anterior trunk pull)    General Comments General comments (skin integrity, edema, etc.): Husband present and supportive during session. Educated on use of gait belt and how to safely perform transfers. Demonstration from PT with husband able to replicate      Pertinent Vitals/Pain Pain Assessment Pain Assessment: Faces Faces Pain Scale: Hurts a little bit Pain Location: L hip Pain Descriptors / Indicators: Discomfort, Grimacing, Guarding     PT Goals (current goals can now be found in the care plan section) Acute Rehab PT Goals Patient Stated Goal: go home PT Goal Formulation: With patient Time For Goal Achievement: 07/09/24 Potential to Achieve Goals: Good Progress towards PT goals: Progressing toward goals    Frequency    Min 2X/week       AM-PAC PT 6 Clicks Mobility   Outcome Measure  Help needed turning from your back to your side while in a flat bed without using bedrails?: A Little Help needed moving from lying on your back to sitting on the  side of a flat bed without using bedrails?: A Little Help needed moving to and from a bed to a chair (including a wheelchair)?: A Lot Help needed standing up from a chair using your arms (e.g., wheelchair or bedside chair)?: Total Help needed to walk in hospital room?: Total Help needed climbing 3-5 steps with a railing? : Total 6 Click Score: 11    End of Session Equipment Utilized During Treatment: Gait belt Activity Tolerance: Patient tolerated treatment well Patient left: in chair;with call bell/phone within reach;with chair alarm set;with family/visitor present Nurse Communication: Mobility status PT Visit Diagnosis: Pain;Difficulty in walking, not elsewhere classified (R26.2);Unsteadiness on feet (R26.81);Other abnormalities of gait and mobility (R26.89) Pain - Right/Left: Left Pain - part of body: Hip     Time: 8892-8858 PT Time Calculation (min) (ACUTE ONLY): 34 min  Charges:    $Therapeutic Exercise: 8-22 mins $Therapeutic Activity: 8-22 mins PT General Charges $$ ACUTE PT VISIT: 1 Visit                    Kate ORN, PT, DPT Secure Chat Preferred  Rehab Office [phone]  Kate BRAVO [name] 06/26/2024, 1:12 PM

## 2024-06-27 ENCOUNTER — Observation Stay (HOSPITAL_COMMUNITY)

## 2024-06-27 DIAGNOSIS — R933 Abnormal findings on diagnostic imaging of other parts of digestive tract: Secondary | ICD-10-CM | POA: Diagnosis not present

## 2024-06-27 DIAGNOSIS — R1310 Dysphagia, unspecified: Secondary | ICD-10-CM

## 2024-06-27 DIAGNOSIS — N179 Acute kidney failure, unspecified: Secondary | ICD-10-CM | POA: Diagnosis not present

## 2024-06-27 DIAGNOSIS — K224 Dyskinesia of esophagus: Secondary | ICD-10-CM | POA: Diagnosis not present

## 2024-06-27 MED ORDER — PANTOPRAZOLE SODIUM 40 MG IV SOLR
40.0000 mg | INTRAVENOUS | Status: DC
  Administered 2024-06-27: 40 mg via INTRAVENOUS
  Filled 2024-06-27: qty 10

## 2024-06-27 MED ORDER — SODIUM CHLORIDE 0.9 % IV SOLN: INTRAVENOUS | Status: DC

## 2024-06-27 MED ORDER — LACTATED RINGERS IV SOLN: INTRAVENOUS | Status: AC

## 2024-06-27 MED ORDER — PHENOL 1.4 % MT LIQD
1.0000 | OROMUCOSAL | Status: DC | PRN
  Filled 2024-06-27: qty 177

## 2024-06-27 NOTE — Progress Notes (Addendum)
 Patient is currently refusing the telemetry box/lines. Informed hospitalist on call during this shift.

## 2024-06-27 NOTE — Progress Notes (Signed)
 2134  Informed/reminded to KEEP NPO after MN for EGD in AM as prescribed.  Understanding verbalized.

## 2024-06-27 NOTE — Progress Notes (Addendum)
 [facility] Progress Note  CC: Dysphagia  Subjective: She ate a few bites of solid food for breakfast and lunch.  No noticeable dysphagia today.  No nausea or vomiting.  No abdominal pain.  No chest pain or shortness of breath.  Last BM was yesterday evening, stools are dark. No obvious bloody stools. Her spouse and daughter at the bedside.   Objective:  Vital signs in last 24 hours: Temp:  [97.6 F (36.4 C)-99.2 F (37.3 C)] 97.6 F (36.4 C) (09/22 0811) Pulse Rate:  [60-75] 60 (09/22 0811) Resp:  [16-18] 17 (09/22 0355) BP: (135-167)/(59-65) 139/60 (09/22 0811) SpO2:  [93 %-98 %] 93 % (09/22 0811) Last BM Date : 06/26/24 General: Alert 88 year old female hard of hearing in no acute distress. Heart: Regular rate and rhythm, soft systolic murmur. Pulm: Breath sounds clear throughout.  On room air. Abdomen: Obese abdomen, soft, mild tenderness to the RLQ without rebound or guarding.  Positive bowel sounds to all 4 quadrants. Extremities: No lower extremity edema. Neurologic:  Alert and oriented x 4.  Speech is clear.  Moves all extremities equally. Psych:  Alert and cooperative. Normal mood and affect.  Intake/Output from previous day: No intake/output data recorded. Intake/Output this shift: No intake/output data recorded.  Lab Results: No results for input(s): WBC, HGB, HCT, PLT in the last 72 hours. BMET No results for input(s): NA, K, CL, CO2, GLUCOSE, BUN, CREATININE, CALCIUM  in the last 72 hours. LFT No results for input(s): PROT, ALBUMIN , AST, ALT, ALKPHOS, BILITOT, BILIDIR, IBILI in the last 72 hours. PT/INR No results for input(s): LABPROT, INR in the last 72 hours. Hepatitis Panel No results for input(s): HEPBSAG, HCVAB, HEPAIGM, HEPBIGM in the last 72 hours.  DG ESOPHAGUS W SINGLE CM (SOL OR THIN BA) Result Date: 06/27/2024 CLINICAL DATA:  88 year old female with history of hiatal hernia,  esophageal dysmotility, dysphagia presents for esophagram due to poor PO tolerance. EXAM: ESOPHAGUS/BARIUM SWALLOW/TABLET STUDY TECHNIQUE: Single contrast examination was performed using thin liquid barium. This exam was performed by Kacie Matthews PA-C, and was supervised and interpreted by Toribio Agreste, MD. FLUOROSCOPY: Radiation Exposure Index (as provided by the fluoroscopic device): 15.3 mGy Kerma COMPARISON:  None Available. FINDINGS: Swallowing: Appears normal. No vestibular penetration or aspiration seen. Pharynx: Unremarkable. Esophagus: Mild focal narrowing in cervical esophagus. Distal esophagus is tortuous and dilated. Esophageal motility: Esophageal dysmotility evidenced by poor progression of barium bolus with retropulsion and multiple tertiary contractions present. Stomach: Moderate hiatal hernia. Contrast readily empties into the herniated portion of the stomach, however, contrast does not pass through into the subdiaphragmatic portion of the stomach despite repositioning and adequate time elapsed. Hernia appears to be filled with ingested debris without definite focal mass. Gastroesophageal reflux: Spontaneous gastroesophageal reflux from the hiatal hernia to the level of the thoracic inlet. Ingested 13mm barium tablet: Not given Other: None. IMPRESSION: 1. Moderate hiatal hernia with contrast not passing distal to the hiatal hernia into the subdiaphragmatic portion of the stomach. No definite mass visualized. Findings suggest a degree of obstruction. Suggest further evaluation with contrast-enhanced CT versus endoscopy. 2. Esophageal dysmotility with multiple tertiary contractions. Moderate reflux from the hiatal hernia to the level of the thoracic inlet. Mild nonobstructing focal narrowing of the cervical esophagus. Electronically Signed   By: Toribio Agreste M.D.   On: 06/27/2024 12:12    Assessment / Plan:  88 year old female with longstanding intermittent solid food dysphagia secondary to  severe esophageal dysmotility. Admitted 9/18 with worsening  symptoms over the last couple of weeks resulting in marked decrease in oral intake. Progressive dysphagia possibly secondary to worsening esophageal dysmotility.  Barium swallow study today showed narrowing in the cervical esophagus, the distal esophagus was tortuous and dilated, esophageal dysmotility and a moderate hiatal hernia and it was noted contrast emptied into the herniated portion of the stomach, however, the contrast did not pass through into the subdiaphragmatic portion of the stomach despite repositioning, hernia appears to be filled with ingested debris without a definitive mass, findings concerning for a degree of obstruction. Afebrile. Hemodynamically stable. - Clear liquid diet - NPO after midnight  - EGD with Dr. Albertus 06/28/2024 benefits and risks discussed including risk with sedation, risk of bleeding, perforation and infection  - IV fluids per the hospitalist - CBC, CMP in am   AKI on CKD3b Secondary to dehydration related to poor p.o. intake Creatinine has returned to baseline  UTI, treated with Rocephin  x 3 days   Recent left acetabulum surgery.  On Heparin  SQ for DVT prophylaxis  COPD   Hypertension  Principal Problem:   AKI (acute kidney injury) (HCC)     LOS: 0 days   Elida CHRISTELLA Shawl  06/27/2024, 1:54 PM  Addendum: I have taken a history, reviewed the chart and examined the patient. I performed a substantive portion of this encounter, including complete performance of at least one of the key components, in conjunction with the APP. I agree with the APP's note, impression and recommendations with additional input as follows.  Barium esophagram showing a cervical esophageal web but also tortuous and dilated distal esophagus with a moderate hiatal hernia.  Barium stasis in the diaphragmatic portion of the stomach at hiatus.   EGD recommended to exclude paraesophageal hernia or some element of  gastric torsion or volvulus.  Her swallowing issues are longstanding but worse of late with frequent regurgitation of food after eating. Exclude additional pathology other than dysmotility.  Discussed with the patient, her husband and one of her daughters who is at bedside.  We discussed the risk, benefits and alternatives to upper endoscopy including anesthesia.  They were agreeable and wished to proceed.  35 minutes total spent today by me including patient facing time, coordination of care, reviewing medical history/procedures/pertinent radiology studies, and documentation of the encounter.

## 2024-06-27 NOTE — H&P (View-Only) (Signed)
 [facility] Progress Note  CC: Dysphagia  Subjective: She ate a few bites of solid food for breakfast and lunch.  No noticeable dysphagia today.  No nausea or vomiting.  No abdominal pain.  No chest pain or shortness of breath.  Last BM was yesterday evening, stools are dark. No obvious bloody stools. Her spouse and daughter at the bedside.   Objective:  Vital signs in last 24 hours: Temp:  [97.6 F (36.4 C)-99.2 F (37.3 C)] 97.6 F (36.4 C) (09/22 0811) Pulse Rate:  [60-75] 60 (09/22 0811) Resp:  [16-18] 17 (09/22 0355) BP: (135-167)/(59-65) 139/60 (09/22 0811) SpO2:  [93 %-98 %] 93 % (09/22 0811) Last BM Date : 06/26/24 General: Alert 88 year old female hard of hearing in no acute distress. Heart: Regular rate and rhythm, soft systolic murmur. Pulm: Breath sounds clear throughout.  On room air. Abdomen: Obese abdomen, soft, mild tenderness to the RLQ without rebound or guarding.  Positive bowel sounds to all 4 quadrants. Extremities: No lower extremity edema. Neurologic:  Alert and oriented x 4.  Speech is clear.  Moves all extremities equally. Psych:  Alert and cooperative. Normal mood and affect.  Intake/Output from previous day: No intake/output data recorded. Intake/Output this shift: No intake/output data recorded.  Lab Results: No results for input(s): WBC, HGB, HCT, PLT in the last 72 hours. BMET No results for input(s): NA, K, CL, CO2, GLUCOSE, BUN, CREATININE, CALCIUM  in the last 72 hours. LFT No results for input(s): PROT, ALBUMIN , AST, ALT, ALKPHOS, BILITOT, BILIDIR, IBILI in the last 72 hours. PT/INR No results for input(s): LABPROT, INR in the last 72 hours. Hepatitis Panel No results for input(s): HEPBSAG, HCVAB, HEPAIGM, HEPBIGM in the last 72 hours.  DG ESOPHAGUS W SINGLE CM (SOL OR THIN BA) Result Date: 06/27/2024 CLINICAL DATA:  88 year old female with history of hiatal hernia,  esophageal dysmotility, dysphagia presents for esophagram due to poor PO tolerance. EXAM: ESOPHAGUS/BARIUM SWALLOW/TABLET STUDY TECHNIQUE: Single contrast examination was performed using thin liquid barium. This exam was performed by Kacie Matthews PA-C, and was supervised and interpreted by Toribio Agreste, MD. FLUOROSCOPY: Radiation Exposure Index (as provided by the fluoroscopic device): 15.3 mGy Kerma COMPARISON:  None Available. FINDINGS: Swallowing: Appears normal. No vestibular penetration or aspiration seen. Pharynx: Unremarkable. Esophagus: Mild focal narrowing in cervical esophagus. Distal esophagus is tortuous and dilated. Esophageal motility: Esophageal dysmotility evidenced by poor progression of barium bolus with retropulsion and multiple tertiary contractions present. Stomach: Moderate hiatal hernia. Contrast readily empties into the herniated portion of the stomach, however, contrast does not pass through into the subdiaphragmatic portion of the stomach despite repositioning and adequate time elapsed. Hernia appears to be filled with ingested debris without definite focal mass. Gastroesophageal reflux: Spontaneous gastroesophageal reflux from the hiatal hernia to the level of the thoracic inlet. Ingested 13mm barium tablet: Not given Other: None. IMPRESSION: 1. Moderate hiatal hernia with contrast not passing distal to the hiatal hernia into the subdiaphragmatic portion of the stomach. No definite mass visualized. Findings suggest a degree of obstruction. Suggest further evaluation with contrast-enhanced CT versus endoscopy. 2. Esophageal dysmotility with multiple tertiary contractions. Moderate reflux from the hiatal hernia to the level of the thoracic inlet. Mild nonobstructing focal narrowing of the cervical esophagus. Electronically Signed   By: Toribio Agreste M.D.   On: 06/27/2024 12:12    Assessment / Plan:  88 year old female with longstanding intermittent solid food dysphagia secondary to  severe esophageal dysmotility. Admitted 9/18 with worsening  symptoms over the last couple of weeks resulting in marked decrease in oral intake. Progressive dysphagia possibly secondary to worsening esophageal dysmotility.  Barium swallow study today showed narrowing in the cervical esophagus, the distal esophagus was tortuous and dilated, esophageal dysmotility and a moderate hiatal hernia and it was noted contrast emptied into the herniated portion of the stomach, however, the contrast did not pass through into the subdiaphragmatic portion of the stomach despite repositioning, hernia appears to be filled with ingested debris without a definitive mass, findings concerning for a degree of obstruction. Afebrile. Hemodynamically stable. - Clear liquid diet - NPO after midnight  - EGD with Dr. Albertus 06/28/2024 benefits and risks discussed including risk with sedation, risk of bleeding, perforation and infection  - IV fluids per the hospitalist - CBC, CMP in am   AKI on CKD3b Secondary to dehydration related to poor p.o. intake Creatinine has returned to baseline  UTI, treated with Rocephin  x 3 days   Recent left acetabulum surgery.  On Heparin  SQ for DVT prophylaxis  COPD   Hypertension  Principal Problem:   AKI (acute kidney injury) (HCC)     LOS: 0 days   Elida CHRISTELLA Shawl  06/27/2024, 1:54 PM  Addendum: I have taken a history, reviewed the chart and examined the patient. I performed a substantive portion of this encounter, including complete performance of at least one of the key components, in conjunction with the APP. I agree with the APP's note, impression and recommendations with additional input as follows.  Barium esophagram showing a cervical esophageal web but also tortuous and dilated distal esophagus with a moderate hiatal hernia.  Barium stasis in the diaphragmatic portion of the stomach at hiatus.   EGD recommended to exclude paraesophageal hernia or some element of  gastric torsion or volvulus.  Her swallowing issues are longstanding but worse of late with frequent regurgitation of food after eating. Exclude additional pathology other than dysmotility.  Discussed with the patient, her husband and one of her daughters who is at bedside.  We discussed the risk, benefits and alternatives to upper endoscopy including anesthesia.  They were agreeable and wished to proceed.  35 minutes total spent today by me including patient facing time, coordination of care, reviewing medical history/procedures/pertinent radiology studies, and documentation of the encounter.

## 2024-06-27 NOTE — Plan of Care (Signed)

## 2024-06-27 NOTE — Progress Notes (Signed)
 Occupational Therapy Treatment Patient Details Name: Deanna Acosta MRN: [id number] DOB: 14-Apr-1936 Today's Date: 06/27/2024   History of present illness Pt is a 88 y.o. female admitted 9/18 from [facility] for nausea, difficulty swallowing, eating minimally and increased urinary frequency. Admitted for AKI and UTI. Recent hospitalization 05/2024 for L acetabular fx, s/p ORIF. PMH: HTN, HLD, COPD, osteoporosis   OT comments  Patient seen for caregiver training with family on bathing and dressing and transfers. Patient's husband was able to assist patient to EOB and with bathing and dressing while seated on EOB with patient performing lateral leaning to bathe peri area bottom. Patient actively participating in bathing tasks.  Patient's husband was able to perform squat pivot transfer to recliner with therapist supervision.  Discharge recommendations continue to be appropriate with discharge home with family to provided 24/7 assistance and HHOT to follow.  Acute OT to continue to follow to address established goals to facilitate DC to next venue of care.        If plan is discharge home, recommend the following:  A lot of help with walking and/or transfers;A lot of help with bathing/dressing/bathroom;Assistance with cooking/housework;Direct supervision/assist for medications management;Assist for transportation;Help with stairs or ramp for entrance;Supervision due to cognitive status   Equipment Recommendations  None recommended by OT    Recommendations for Other Services      Precautions / Restrictions Precautions Precautions: Fall Recall of Precautions/Restrictions: Impaired Restrictions Weight Bearing Restrictions Per Provider Order: Yes LLE Weight Bearing Per Provider Order: Touchdown weight bearing       Mobility Bed Mobility Overal bed mobility: Needs Assistance Bed Mobility: Supine to Sit     Supine to sit: Min assist     General bed mobility comments: Min assist wtih  LEs    Transfers Overall transfer level: Needs assistance Equipment used: None Transfers: Bed to chair/wheelchair/BSC     Squat pivot transfers: Mod assist       General transfer comment: husband performed face to face squat pivot transfer to recliner with therapist supervision     Balance Overall balance assessment: Needs assistance Sitting-balance support: Feet supported Sitting balance-Leahy Scale: Fair Sitting balance - Comments: able to perform self care seated on EOB                                   ADL either performed or assessed with clinical judgement   ADL Overall ADL's : Needs assistance/impaired     Grooming: Wash/dry hands;Wash/dry face;Supervision/safety;Sitting Grooming Details (indicate cue type and reason): on EOB Upper Body Bathing: Minimal assistance;Sitting Upper Body Bathing Details (indicate cue type and reason): on EOB Lower Body Bathing: Moderate assistance;Sitting/lateral leans Lower Body Bathing Details (indicate cue type and reason): able to bathe peri area front and legs to knees while sititng and requires assistance for bottom with lateral leans Upper Body Dressing : Minimal assistance;Sitting Upper Body Dressing Details (indicate cue type and reason): gown Lower Body Dressing: Maximal assistance;Sitting/lateral leans                 General ADL Comments: Patient's husband and granddaughter present.  Husband assisted patient with self care seated on EOB and transfer to recliner with education from therapist    Extremity/Trunk Assessment              Vision       Perception     Praxis     Communication Communication Communication:  Impaired Factors Affecting Communication: Hearing impaired   Cognition Arousal: Alert Behavior During Therapy: WFL for tasks assessed/performed Cognition: History of cognitive impairments Difficult to assess due to: Hard of hearing/deaf           OT - Cognition Comments:  able to recall recent past but demonstrates confusion when following directions                 Following commands: Impaired Following commands impaired: Follows one step commands with increased time, Follows multi-step commands with increased time      Cueing   Cueing Techniques: Verbal cues, Gestural cues, Tactile cues, Visual cues  Exercises      Shoulder Instructions       General Comments caregiver training on self care and transfers    Pertinent Vitals/ Pain       Pain Assessment Pain Assessment: Faces Faces Pain Scale: Hurts a little bit Pain Location: L hip Pain Descriptors / Indicators: Discomfort, Grimacing, Guarding Pain Intervention(s): Limited activity within patient's tolerance, Monitored during session, Repositioned  Home Living                                          Prior Functioning/Environment              Frequency  Min 2X/week        Progress Toward Goals  OT Goals(current goals can now be found in the care plan section)  Progress towards OT goals: Progressing toward goals  Acute Rehab OT Goals Patient Stated Goal: to return home OT Goal Formulation: With patient/family Time For Goal Achievement: 07/09/24 Potential to Achieve Goals: Good ADL Goals Additional ADL Goal #1: Family with verbalize and demonstrate understanding of WB precaution with adls Additional ADL Goal #2: Patient and family will verbalize and demonstrate abiltiy to bathe and dress patient from bed level  Plan      Co-evaluation                 AM-PAC OT 6 Clicks Daily Activity     Outcome Measure   Help from another person eating meals?: A Little Help from another person taking care of personal grooming?: A Little Help from another person toileting, which includes using toliet, bedpan, or urinal?: A Lot Help from another person bathing (including washing, rinsing, drying)?: A Little Help from another person to put on and taking  off regular upper body clothing?: A Little Help from another person to put on and taking off regular lower body clothing?: A Lot 6 Click Score: 16    End of Session Equipment Utilized During Treatment: Gait belt  OT Visit Diagnosis: Unsteadiness on feet (R26.81);Other abnormalities of gait and mobility (R26.89);History of falling (Z91.81);Pain;Other symptoms and signs involving cognitive function Pain - Right/Left: Left Pain - part of body: Hip   Activity Tolerance Patient tolerated treatment well   Patient Left in chair;with call bell/phone within reach;with chair alarm set;with family/visitor present   Nurse Communication Mobility status        Time: 8858-8784 OT Time Calculation (min): 34 min  Charges: OT General Charges $OT Visit: 1 Visit OT Treatments $Self Care/Home Management : 23-37 mins  Dick Laine, OTA Acute Rehabilitation Services  Office [phone]   Jeb LITTIE Laine 06/27/2024, 2:22 PM

## 2024-06-27 NOTE — Anesthesia Preprocedure Evaluation (Signed)
 Anesthesia Evaluation  Patient identified by MRN, date of birth, ID band Patient awake    Reviewed: Allergy & Precautions, NPO status , Patient's Chart, lab work & pertinent test results  History of Anesthesia Complications (+) PONV and history of anesthetic complications  Airway Mallampati: III  TM Distance: >3 FB    Comment: Previous grade I view with MAC 3, easy mask with OPA Dental  (+) Dental Advisory Given, Edentulous Upper, Edentulous Lower   Pulmonary neg shortness of breath, asthma , neg sleep apnea, COPD, neg recent URI, former smoker   Pulmonary exam normal breath sounds clear to auscultation       Cardiovascular hypertension, Pt. on medications and Pt. on home beta blockers (-) angina (-) Past MI, (-) Cardiac Stents and (-) CABG (-) dysrhythmias  Rhythm:Regular Rate:Normal  HLD   Neuro/Psych  Headaches, neg Seizures PSYCHIATRIC DISORDERS Anxiety Depression     Neuromuscular disease (RSD)    GI/Hepatic Neg liver ROS, hiatal hernia,GERD  Medicated,,Esophageal dysmotility   Endo/Other  Pre-diabetes  Renal/GU CRFRenal disease     Musculoskeletal  (+) Arthritis ,    Abdominal   Peds  Hematology  (+) Blood dyscrasia   Anesthesia Other Findings   Reproductive/Obstetrics                              Anesthesia Physical Anesthesia Plan  ASA: 3  Anesthesia Plan: MAC   Post-op Pain Management: Minimal or no pain anticipated   Induction: Intravenous  PONV Risk Score and Plan: 3 and Propofol  infusion, TIVA and Treatment may vary due to age or medical condition  Airway Management Planned: Natural Airway and Simple Face Mask  Additional Equipment:   Intra-op Plan:   Post-operative Plan:   Informed Consent: I have reviewed the patients History and Physical, chart, labs and discussed the procedure including the risks, benefits and alternatives for the proposed anesthesia with the  patient or authorized representative who has indicated his/her understanding and acceptance.     Dental advisory given  Plan Discussed with: CRNA and Anesthesiologist  Anesthesia Plan Comments: (Discussed with patient risks of MAC including, but not limited to, minor pain or discomfort, hearing people in the room, and possible need for backup general anesthesia. Risks for general anesthesia also discussed including, but not limited to, sore throat, hoarse voice, chipped/damaged teeth, injury to vocal cords, nausea and vomiting, allergic reactions, lung infection, heart attack, stroke, and death. All questions answered. )         Anesthesia Quick Evaluation

## 2024-06-27 NOTE — Plan of Care (Addendum)
 Pt has been confused for majority of this shift with moments answering questions correctly. Patient removed gown and telemetry multiple times, despite re-orienting patient, does not seem to understand.  Bed alarm remains on for the patient.  Call bell within place.  Bed in the lowest position.   Problem: Clinical Measurements: Goal: Diagnostic test results will improve Outcome: Progressing   Problem: Nutrition: Goal: Adequate nutrition will be maintained Outcome: Progressing   Problem: Coping: Goal: Level of anxiety will decrease Outcome: Progressing   Problem: Education: Goal: Knowledge of General Education information will improve Description: Including pain rating scale, medication(s)/side effects and non-pharmacologic comfort measures Outcome: Not Progressing   Problem: Health Behavior/Discharge Planning: Goal: Ability to manage health-related needs will improve Outcome: Not Progressing   Problem: Clinical Measurements: Goal: Will remain free from infection Outcome: Not Progressing   Problem: Activity: Goal: Risk for activity intolerance will decrease Outcome: Not Progressing

## 2024-06-27 NOTE — Progress Notes (Addendum)
 PROGRESS NOTE  Deanna Acosta    DOB: Nov 09, 1935, 88 y.o.  FMW:[id number]    Code Status: Full Code   DOA: 06/23/2024   LOS: 0   Brief hospital course  Deanna Acosta is a 88 y.o. female with a PMH significant for hypertension, hyperlipidemia, COPD, CKD 3B, hearing impairment, history of esophageal dysmotility without obstruction (status post dilatation), hiatal hernia seen on EGD done in 2019, recently admitted after left acetabulum fracture which was repaired, who presents to the ER from rehab with complaints of difficulty swallowing, nausea, spontaneous regurgitation without odynophagia, eating minimally, dysuria and increased urinary frequency.   ED Course: Temperature 97.4.  BP 103/46, pulse 65, respiration rate 27, O2 saturation 97% on room air.  Lab studies notable for serum bicarb 18, anion gap 15, magnesium  1.6, alkaline phosphatase 175, GFR 22.  WBC 12.1, neutrophil 8.4.. Mentation is intact.  Hypovolemic on exam.  IV fluid LR 1 L x 1, IV H2 receptor blocker, and IV antibiotics were initiated in the ER.  BUN 70, creatinine elevated 2.11 above baseline of 1.24.    Admitted for management of prerenal AKI with IV fluid hydration, swallow evaluation by speech therapist, and treatment of her UTI.   Assessment & Plan  Principal Problem:   AKI (acute kidney injury) (HCC)  Prerenal AKI in the setting of CKD 3B, secondary to dehydration from poor oral intake Cr returned to baseline with IV hydration Continue to hold hydrochlorothiazide  Creatinine 2.11 on admission has returned to near baseline creatinine 1.3   Dysphagia with history of esophageal dysmotility without obstruction Status post dilatation History of hiatal hernia Patient denies any odynophagia or difficulty swallowing but has not been eating well. Evaluated by GI, planning for barium swallow study. resume aspirin .  Addendum: Modified barium study shows no passage of contrast beyond herniated stomach.  Plan for EGD  tomorrow.  N.p.o. after midnight.  Will start IV LR at 75 cc/h.  UTI, POA- without symptoms.  Urine culture with mixed growth questionable significance Completed Rocephin  3 days.  Will discontinue  Hypertension BP soft on admission. This has improved.  Will resume Cardizem  and metoprolol .  Continue to hold hydrochlorothiazide  due to AKI.  Hypomagnesemia- normalized after replacement   Recent left acetabulum surgery Prognosis from Dr. Layman op note 05/31/2024: PROGNOSIS:  Deanna Acosta has sustained a complex injury and the repair has restored sufficient congruity and stability to the hip joint to enable bed-to-chair mobilization with touchdown weightbearing on the left lower extremity for the next 8 weeks and graduated weightbearing for the ensuing 4 weeks.  Formal pharmacologic DVT prophylaxis. There remains increased risk for perioperative complications given associated injuries, comorbidities, and age.  Body mass index is 27.4 kg/m.  VTE ppx: heparin  injection 5,000 Units Start: 06/23/24 2200  Diet:     Diet   DIET DYS 2 Room service appropriate? Yes with Assist; Fluid consistency: Thin   Consultants: GI  Subjective 06/27/24    Pt reports no complaints today.  Appetite is poor.  Has not been eating much.  Husband and nursing reported some loose stools.  Awaiting barium swallow study.   Objective  Blood pressure (!) 140/63, pulse 63, temperature 97.6 F (36.4 C), temperature source Oral, resp. rate 16, height 5' 4 (1.626 m), weight 72.4 kg, SpO2 95%. No intake or output data in the 24 hours ending 06/27/24 1056  Filed Weights   06/24/24 0117  Weight: 72.4 kg    Physical Exam:  General: awake, alert, NAD,  confused conversation. HEENT: atraumatic, clear conjunctiva, anicteric sclera, MMM, hearing grossly normal Respiratory: normal respiratory effort. Cardiovascular: quick capillary refill, normal S1/S2, RRR, no JVD, murmurs Gastrointestinal: soft, NT,  ND Nervous: A&O x1. no gross focal neurologic deficits, normal speech Extremities: moves all equally, no edema, normal tone Skin: dry, intact, normal temperature, normal color. No rashes, lesions or ulcers on exposed skin Psychiatry: normal mood, congruent affect  Labs   I have personally reviewed the following labs and imaging studies CBC    Component Value Date/Time   WBC 9.0 06/24/2024 0428   RBC 4.12 06/24/2024 0428   HGB 12.1 06/24/2024 0428   HCT 38.6 06/24/2024 0428   PLT 339 06/24/2024 0428   MCV 93.7 06/24/2024 0428   MCH 29.4 06/24/2024 0428   MCHC 31.3 06/24/2024 0428   RDW 13.5 06/24/2024 0428   LYMPHSABS 2.4 06/23/2024 1637   MONOABS 1.0 06/23/2024 1637   EOSABS 0.2 06/23/2024 1637   BASOSABS 0.1 06/23/2024 1637      Latest Ref Rng & Units 06/24/2024    4:28 AM 06/23/2024    5:42 PM 06/04/2024   10:25 AM  BMP  Glucose 70 - 99 mg/dL 90  98  864   BUN 8 - 23 mg/dL 57  70  29   Creatinine 0.44 - 1.00 mg/dL 8.49  7.88  8.75   Sodium 135 - 145 mmol/L 139  135  137   Potassium 3.5 - 5.1 mmol/L 3.5  4.3  4.8   Chloride 98 - 111 mmol/L 102  102  107   CO2 22 - 32 mmol/L 18  18  19    Calcium  8.9 - 10.3 mg/dL 9.3  9.5  8.8     No results found.   Disposition Plan & Communication  Patient status: Observation  Admitted From: SNF Planned disposition location: Home with home health.  Patient declined transfer back to SNF  anticipated discharge date: 9/22 pending GI evaluation   Family Communication:  husband at bedside     Author: Jevin Camino, DO [facility] 06/27/2024, 10:56 AM   Available by Epic secure chat 7AM-7PM. If 7PM-7AM, please contact night-coverage.  [facility] contact information found on [email or web address].

## 2024-06-28 ENCOUNTER — Ambulatory Visit (HOSPITAL_COMMUNITY): Payer: Self-pay | Admitting: Anesthesiology

## 2024-06-28 ENCOUNTER — Encounter (HOSPITAL_COMMUNITY): Payer: Self-pay | Admitting: Internal Medicine

## 2024-06-28 ENCOUNTER — Observation Stay (HOSPITAL_COMMUNITY): Payer: Self-pay | Admitting: Anesthesiology

## 2024-06-28 ENCOUNTER — Encounter (HOSPITAL_COMMUNITY)
Admission: EM | Disposition: A | Discharge status: Home Health | Payer: Self-pay | Source: Home / Self Care | Attending: Emergency Medicine

## 2024-06-28 DIAGNOSIS — I129 Hypertensive chronic kidney disease with stage 1 through stage 4 chronic kidney disease, or unspecified chronic kidney disease: Secondary | ICD-10-CM

## 2024-06-28 DIAGNOSIS — K2100 Gastro-esophageal reflux disease with esophagitis, without bleeding: Secondary | ICD-10-CM | POA: Diagnosis not present

## 2024-06-28 DIAGNOSIS — K449 Diaphragmatic hernia without obstruction or gangrene: Secondary | ICD-10-CM

## 2024-06-28 DIAGNOSIS — K2210 Ulcer of esophagus without bleeding: Secondary | ICD-10-CM

## 2024-06-28 DIAGNOSIS — N183 Chronic kidney disease, stage 3 unspecified: Secondary | ICD-10-CM

## 2024-06-28 DIAGNOSIS — R933 Abnormal findings on diagnostic imaging of other parts of digestive tract: Secondary | ICD-10-CM | POA: Diagnosis not present

## 2024-06-28 DIAGNOSIS — Z87891 Personal history of nicotine dependence: Secondary | ICD-10-CM

## 2024-06-28 DIAGNOSIS — K2970 Gastritis, unspecified, without bleeding: Secondary | ICD-10-CM | POA: Diagnosis not present

## 2024-06-28 DIAGNOSIS — K224 Dyskinesia of esophagus: Secondary | ICD-10-CM | POA: Diagnosis not present

## 2024-06-28 DIAGNOSIS — K209 Esophagitis, unspecified without bleeding: Secondary | ICD-10-CM

## 2024-06-28 DIAGNOSIS — R1310 Dysphagia, unspecified: Secondary | ICD-10-CM | POA: Diagnosis not present

## 2024-06-28 HISTORY — PX: ESOPHAGOGASTRODUODENOSCOPY: SHX5428

## 2024-06-28 LAB — COMPREHENSIVE METABOLIC PANEL WITH GFR
ALT: 13 U/L (ref 0–44)
AST: 19 U/L (ref 15–41)
Albumin: 2.6 g/dL — ABNORMAL LOW (ref 3.5–5.0)
Alkaline Phosphatase: 151 U/L — ABNORMAL HIGH (ref 38–126)
Anion gap: 11 (ref 5–15)
BUN: 16 mg/dL (ref 8–23)
CO2: 21 mmol/L — ABNORMAL LOW (ref 22–32)
Calcium: 10.3 mg/dL (ref 8.9–10.3)
Chloride: 106 mmol/L (ref 98–111)
Creatinine, Ser: 1.09 mg/dL — ABNORMAL HIGH (ref 0.44–1.00)
GFR, Estimated: 49 mL/min — ABNORMAL LOW (ref >60)
Glucose, Bld: 91 mg/dL (ref 70–99)
Potassium: 3.3 mmol/L — ABNORMAL LOW (ref 3.5–5.1)
Sodium: 138 mmol/L (ref 135–145)
Total Bilirubin: 0.8 mg/dL (ref 0.0–1.2)
Total Protein: 5.8 g/dL — ABNORMAL LOW (ref 6.5–8.1)

## 2024-06-28 LAB — CBC
HCT: 35.1 % — ABNORMAL LOW (ref 36.0–46.0)
Hemoglobin: 11.5 g/dL — ABNORMAL LOW (ref 12.0–15.0)
MCH: 29.9 pg (ref 26.0–34.0)
MCHC: 32.8 g/dL (ref 30.0–36.0)
MCV: 91.4 fL (ref 80.0–100.0)
Platelets: 269 K/uL (ref 150–400)
RBC: 3.84 MIL/uL — ABNORMAL LOW (ref 3.87–5.11)
RDW: 13.7 % (ref 11.5–15.5)
WBC: 9.7 K/uL (ref 4.0–10.5)
nRBC: 0 % (ref 0.0–0.2)

## 2024-06-28 SURGERY — EGD (ESOPHAGOGASTRODUODENOSCOPY)
Anesthesia: Monitor Anesthesia Care

## 2024-06-28 MED ORDER — SUCRALFATE 1 G PO TABS: 1.0000 g | ORAL_TABLET | Freq: Three times a day (TID) | ORAL | Status: DC

## 2024-06-28 MED ORDER — LIDOCAINE 2% (20 MG/ML) 5 ML SYRINGE
INTRAMUSCULAR | Status: DC | PRN
  Administered 2024-06-28: 100 mg via INTRAVENOUS

## 2024-06-28 MED ORDER — PROPOFOL 10 MG/ML IV BOLUS
INTRAVENOUS | Status: DC | PRN
  Administered 2024-06-28: 30 mg via INTRAVENOUS
  Administered 2024-06-28: 80 mg via INTRAVENOUS

## 2024-06-28 MED ORDER — PANTOPRAZOLE SODIUM 40 MG PO TBEC
40.0000 mg | DELAYED_RELEASE_TABLET | Freq: Two times a day (BID) | ORAL | Status: DC
  Administered 2024-06-28 – 2024-06-29 (×3): 40 mg via ORAL
  Filled 2024-06-28 (×3): qty 1

## 2024-06-28 MED ORDER — SUCRALFATE 1 GM/10ML PO SUSP
1.0000 g | Freq: Three times a day (TID) | ORAL | Status: DC
Start: 2024-06-28 — End: 2024-07-12
  Administered 2024-06-28 – 2024-06-29 (×5): 1 g via ORAL
  Filled 2024-06-28 (×5): qty 10

## 2024-06-28 NOTE — Plan of Care (Signed)

## 2024-06-28 NOTE — Progress Notes (Signed)
 PT Cancellation Note  Patient Details Name: SHAVAUGHN SEIDL MRN: [id number] DOB: 1936/09/22   Cancelled Treatment:    Reason Eval/Treat Not Completed: Other (comment); attempted earlier and pt gone for EGD.  This pm patient eating and RN reports MD said she has to eat to be able to d/c home tomorrow.  Discussed with spouse and daughter plans for home and if they feel safe with car transfers.  Reviewed with spouse and daughter states grandaughter who lives close is a Therapist, music who plans to assist. Will follow up in am, potentially prior to d/c.    Montie Portal 06/28/2024, 5:30 PM Micheline Portal, PT Acute Rehabilitation Services Office:[phone] 06/28/2024

## 2024-06-28 NOTE — Anesthesia Postprocedure Evaluation (Signed)
 Anesthesia Post Note  Patient: Renella G Napierkowski  Procedure(s) Performed: EGD (ESOPHAGOGASTRODUODENOSCOPY)     Patient location during evaluation: PACU Anesthesia Type: MAC Level of consciousness: awake Pain management: pain level controlled Vital Signs Assessment: post-procedure vital signs reviewed and stable Respiratory status: spontaneous breathing, nonlabored ventilation and respiratory function stable Cardiovascular status: stable and blood pressure returned to baseline Postop Assessment: no apparent nausea or vomiting Anesthetic complications: no   No notable events documented.  Last Vitals:  Vitals:   06/28/24 0952 06/28/24 1026  BP: (!) 143/58 (!) 135/55  Pulse: 65 63  Resp: (!) 22   Temp:    SpO2: 93% 91%    Last Pain:  Vitals:   06/28/24 0923  TempSrc: Temporal  PainSc:                  Delon Aisha Arch

## 2024-06-28 NOTE — Op Note (Signed)
 [facility] Patient Name: Deanna Acosta Procedure Date : 06/28/2024 MRN: [id number] Attending MD: Gordy CHRISTELLA Starch , MD, [id number] Date of Birth: [birth year] CSN: [id number] Age: 88 Admit Type: Inpatient Procedure:                Upper GI endoscopy Indications:              Dysphagia, Gastro-esophageal reflux disease,                            Abnormal cine-esophagram Providers:                Gordy CHRISTELLA. Starch, MD, Heron MICAEL Dawn RN, Ozell Pouch, Curtistine Bishop, Technician Referring MD:             [facility] Medicines:                Monitored Anesthesia Care Complications:            No immediate complications. Estimated Blood Loss:     Estimated blood loss was minimal. Procedure:                Pre-Anesthesia Assessment:                           - Prior to the procedure, a History and Physical                            was performed, and patient medications and                            allergies were reviewed. The patient's tolerance of                            previous anesthesia was also reviewed. The risks                            and benefits of the procedure and the sedation                            options and risks were discussed with the patient.                            All questions were answered, and informed consent                            was obtained. Prior Anticoagulants: The patient has                            taken no anticoagulant or antiplatelet agents. ASA                            Grade Assessment: III - A patient with severe  systemic disease. After reviewing the risks and                            benefits, the patient was deemed in satisfactory                            condition to undergo the procedure.                           After obtaining informed consent, the endoscope was                            passed under direct vision. Throughout the                             procedure, the patient's blood pressure, pulse, and                            oxygen  saturations were monitored continuously. The                            [id number]-H190 ([id number]) Olympus endoscope was introduced                            through the mouth, and advanced to the second part                            of duodenum. The upper GI endoscopy was                            accomplished without difficulty. The patient                            tolerated the procedure well. Scope In: Scope Out: Findings:      LA Grade D (one or more mucosal breaks involving at least 75% of       esophageal circumference) esophagitis with no bleeding was found 25 to       33 cm from the incisors. Biopsies were taken with a cold forceps for       histology.      A 7 cm hiatal hernia was found. The proximal extent of the gastric folds       (end of tubular esophagus) was 33 cm from the incisors. The hiatal       narrowing was 40 cm from the incisors.      The gastroesophageal flap valve was visualized endoscopically and       classified as Hill Grade IV (no fold, wide open lumen, hiatal hernia       present).      Mild inflammation characterized by erythema and granularity was found in       the cardia/fundus at diaphragmatic hiatus.      The exam of the stomach was otherwise normal.      The examined duodenum was normal. Impression:               - LA Grade D reflux, acute and erosive esophagitis  with no bleeding. Biopsied.                           - 7 cm hiatal hernia.                           - Gastroesophageal flap valve classified as Hill                            Grade IV (no fold, wide open lumen, hiatal hernia                            present).                           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                           - Normal examined  duodenum. Moderate Sedation:      N/A Recommendation:           - Return patient to hospital ward for ongoing care.                           - Advance diet as tolerated. Reflux precautions                            (eating and drinking upright to the greatest extent                            possible, trying to lie down with an empty stomach).                           - Continue present medications. Indefinite BID PPI                            (as an outpatient could consider vonoprazon 20 mg                            if persistent symptoms). Liquid sucralfate  1 g                            before meals and at bedtime x 2 weeks and then as                            needed.                           - Await pathology results (to exclude infectious                            esophagitis, which is less likely than acid reflux  associated).                           - I will arrange follow-up with Dr. Abran at                            [facility]. Procedure Code(s):        --- Professional ---                           [phone], Esophagogastroduodenoscopy, flexible,                            transoral; with biopsy, single or multiple Diagnosis Code(s):        --- Professional ---                           K21.00, Gastro-esophageal reflux disease with                            esophagitis, without bleeding                           K20.80, Other esophagitis without bleeding                           K44.9, Diaphragmatic hernia without obstruction or                            gangrene                           K29.70, Gastritis, unspecified, without bleeding                           R13.10, Dysphagia, unspecified                           R93.3, Abnormal findings on diagnostic imaging of                            other parts of digestive tract CPT copyright 2022 American [employer] Association. All rights reserved. The codes documented in this report are  preliminary and upon coder review may  be revised to meet current compliance requirements. Gordy CHRISTELLA Starch, MD 06/28/2024 9:57:26 AM This report has been signed electronically. Number of Addenda: 0

## 2024-06-28 NOTE — Interval H&P Note (Signed)
 History and Physical Interval Note: For upper endoscopy today to evaluate the patient's history of GERD, hiatal hernia, worsening dysphagia type symptoms and abnormal barium esophagram showing a possible mild outlet obstruction in the distal esophagus/proximal stomach near hiatus hernia.  Discussed with the patient, her husband and daughter yesterday regarding the risk, benefits and alternatives.  All in agreement and wished to proceed.     Latest Ref Rng & Units 06/28/2024    5:20 AM 06/24/2024    4:28 AM 06/23/2024    4:37 PM  CBC  WBC 4.0 - 10.5 K/uL 9.7  9.0  12.1   Hemoglobin 12.0 - 15.0 g/dL 88.4  87.8  85.9   Hematocrit 36.0 - 46.0 % 35.1  38.6  43.7   Platelets 150 - 400 K/uL 269  339  456     06/28/2024 8:39 AM  Charolette KANDICE Blush  has presented today for surgery, with the diagnosis of Dysphagia, esophageal dysmotility.  The various methods of treatment have been discussed with the patient and family. After consideration of risks, benefits and other options for treatment, the patient has consented to  Procedure(s): EGD (ESOPHAGOGASTRODUODENOSCOPY) (N/A) as a surgical intervention.  The patient's history has been reviewed, patient examined, no change in status, stable for surgery.  I have reviewed the patient's chart and labs.  Questions were answered to the patient's satisfaction.     Gordy HERO Boniface Goffe

## 2024-06-28 NOTE — Progress Notes (Addendum)
 PROGRESS NOTE  KAYDYN CHISM    DOB: [birth year], 88 y.o.  FMW:[id number]    Code Status: Full Code   DOA: 06/23/2024   LOS: 0   Brief hospital course  LANAY ZINDA is a 88 y.o. female with a PMH significant for hypertension, hyperlipidemia, COPD, CKD 3B, hearing impairment, history of esophageal dysmotility without obstruction (status post dilatation), hiatal hernia seen on EGD done in 2019, recently admitted after left acetabulum fracture which was repaired, who presents to the ER from rehab with complaints of difficulty swallowing, nausea, spontaneous regurgitation without odynophagia, eating minimally, dysuria and increased urinary frequency.   ED Course: Temperature 97.4.  BP 103/46, pulse 65, respiration rate 27, O2 saturation 97% on room air.  Lab studies notable for serum bicarb 18, anion gap 15, magnesium  1.6, alkaline phosphatase 175, GFR 22.  WBC 12.1, neutrophil 8.4.. Mentation is intact.  Hypovolemic on exam.  IV fluid LR 1 L x 1, IV H2 receptor blocker, and IV antibiotics were initiated in the ER.  BUN 70, creatinine elevated 2.11 above baseline of 1.24.    Admitted for management of prerenal AKI with IV fluid hydration, swallow evaluation /GI  and treatment of her UTI.  Assessment & Plan  Principal Problem:   AKI (acute kidney injury) Active Problems:   Abnormal esophagram   Acute esophagitis   Hiatal hernia  Dysphagia with history of esophageal dysmotility without obstruction Status post dilatation History of hiatal hernia Patient denies any odynophagia or difficulty swallowing but has not been eating well.  Evaluated by speech therapy/GI. Modified barium study 9/22 shows no passage of contrast beyond herniated stomach.  EGD completed 9/23.  EGD for esophagitis/gastritis noted. Will continue twice daily PPI, also Carafate . Clear liquid diet and advance as tolerated. Continue aspirin    Prerenal AKI in the setting of CKD 3B, secondary to dehydration from poor  oral intake Cr returned to baseline with IV hydration Continue to hold hydrochlorothiazide  Creatinine 2.11 on admission has returned to near baseline creatinine 1.3    UTI, POA-  Urine culture with mixed growth questionable significance Completed Rocephin  3 days.    Hypertension BP soft on admission. This has improved.  Will resume Cardizem  and metoprolol .  Continue to hold hydrochlorothiazide  due to AKI.  Hypomagnesemia- normalized after replacement   Recent left acetabulum surgery Prognosis from Dr. Layman op note 05/31/2024: PROGNOSIS:  Mikenzie G Hatton has sustained a complex injury and the repair has restored sufficient congruity and stability to the hip joint to enable bed-to-chair mobilization with touchdown weightbearing on the left lower extremity for the next 8 weeks and graduated weightbearing for the ensuing 4 weeks.  Formal pharmacologic DVT prophylaxis. There remains increased risk for perioperative complications given associated injuries, comorbidities, and age.  Body mass index is 27.4 kg/m.  VTE ppx: heparin  injection 5,000 Units Start: 06/23/24 2200  Diet:     Diet   DIET SOFT Fluid consistency: Thin   Consultants: GI  Subjective 06/28/24    Pt reports no complaints today.  Patient seen after EGD today.  She had some sips of liquid and is waiting for potato soup from home.  She says she would like to go home but understands that she need to tolerate some p.o. prior to discharge    Blood pressure (!) 140/63, pulse 63, temperature 97.6 F (36.4 C), temperature source Oral, resp. rate 16, height 5' 4 (1.626 m), weight 72.4 kg, SpO2 95%.  Intake/Output Summary (Last 24 hours) at 06/28/2024 1308 Last  data filed at 06/28/2024 0919 Gross per 24 hour  Intake 336.39 ml  Output --  Net 336.39 ml    Filed Weights   06/24/24 0117  Weight: 72.4 kg    Physical Exam:  General: awake, alert, NAD, confused conversation. HEENT: atraumatic, clear  conjunctiva, anicteric sclera, MMM, hearing grossly normal Respiratory: normal respiratory effort. Cardiovascular: quick capillary refill, normal S1/S2, RRR, no JVD, murmurs Gastrointestinal: soft, NT, ND Nervous: A&O x1. no gross focal neurologic deficits, normal speech Extremities: moves all equally, no edema, normal tone Skin: dry, intact, normal temperature, normal color. No rashes, lesions or ulcers on exposed skin Psychiatry: normal mood, congruent affect  Labs   I have personally reviewed the following labs and imaging studies CBC    Component Value Date/Time   WBC 9.7 06/28/2024 0520   RBC 3.84 (L) 06/28/2024 0520   HGB 11.5 (L) 06/28/2024 0520   HCT 35.1 (L) 06/28/2024 0520   PLT 269 06/28/2024 0520   MCV 91.4 06/28/2024 0520   MCH 29.9 06/28/2024 0520   MCHC 32.8 06/28/2024 0520   RDW 13.7 06/28/2024 0520   LYMPHSABS 2.4 06/23/2024 1637   MONOABS 1.0 06/23/2024 1637   EOSABS 0.2 06/23/2024 1637   BASOSABS 0.1 06/23/2024 1637      Latest Ref Rng & Units 06/28/2024    5:20 AM 06/24/2024    4:28 AM 06/23/2024    5:42 PM  BMP  Glucose 70 - 99 mg/dL 91  90  98   BUN 8 - 23 mg/dL 16  57  70   Creatinine 0.44 - 1.00 mg/dL 8.90  8.49  7.88   Sodium 135 - 145 mmol/L 138  139  135   Potassium 3.5 - 5.1 mmol/L 3.3  3.5  4.3   Chloride 98 - 111 mmol/L 106  102  102   CO2 22 - 32 mmol/L 21  18  18    Calcium  8.9 - 10.3 mg/dL 89.6  9.3  9.5     DG ESOPHAGUS W SINGLE CM (SOL OR THIN BA) Result Date: 06/27/2024 CLINICAL DATA:  88 year old female with history of hiatal hernia, esophageal dysmotility, dysphagia presents for esophagram due to poor PO tolerance. EXAM: ESOPHAGUS/BARIUM SWALLOW/TABLET STUDY TECHNIQUE: Single contrast examination was performed using thin liquid barium. This exam was performed by Kacie Matthews PA-C, and was supervised and interpreted by Toribio Agreste, MD. FLUOROSCOPY: Radiation Exposure Index (as provided by the fluoroscopic device): 15.3 mGy Kerma  COMPARISON:  None Available. FINDINGS: Swallowing: Appears normal. No vestibular penetration or aspiration seen. Pharynx: Unremarkable. Esophagus: Mild focal narrowing in cervical esophagus. Distal esophagus is tortuous and dilated. Esophageal motility: Esophageal dysmotility evidenced by poor progression of barium bolus with retropulsion and multiple tertiary contractions present. Stomach: Moderate hiatal hernia. Contrast readily empties into the herniated portion of the stomach, however, contrast does not pass through into the subdiaphragmatic portion of the stomach despite repositioning and adequate time elapsed. Hernia appears to be filled with ingested debris without definite focal mass. Gastroesophageal reflux: Spontaneous gastroesophageal reflux from the hiatal hernia to the level of the thoracic inlet. Ingested 13mm barium tablet: Not given Other: None. IMPRESSION: 1. Moderate hiatal hernia with contrast not passing distal to the hiatal hernia into the subdiaphragmatic portion of the stomach. No definite mass visualized. Findings suggest a degree of obstruction. Suggest further evaluation with contrast-enhanced CT versus endoscopy. 2. Esophageal dysmotility with multiple tertiary contractions. Moderate reflux from the hiatal hernia to the level of the thoracic inlet. Mild nonobstructing  focal narrowing of the cervical esophagus. Electronically Signed   By: Toribio Agreste M.D.   On: 06/27/2024 12:12     Disposition Plan & Communication  Patient status: Observation  Admitted From: SNF Planned disposition location: Home with home health.  Patient declined transfer back to SNF  anticipated discharge date: 9/22 pending GI evaluation   Family Communication:  husband at bedside     Author: Aubert Choyce, DO [facility] 06/28/2024, 1:08 PM   Available by Epic secure chat 7AM-7PM. If 7PM-7AM, please contact night-coverage.  [facility] contact information found on [email or web address].

## 2024-06-28 NOTE — Transfer of Care (Signed)
 Immediate Anesthesia Transfer of Care Note  Patient: Deanna Acosta  Procedure(s) Performed: EGD (ESOPHAGOGASTRODUODENOSCOPY)  Patient Location: Endoscopy Unit  Anesthesia Type:MAC  Level of Consciousness: awake, alert , and oriented  Airway & Oxygen  Therapy: Patient Spontanous Breathing and Patient connected to nasal cannula oxygen   Post-op Assessment: Report given to RN and Post -op Vital signs reviewed and stable  Post vital signs: Reviewed and stable  Last Vitals:  Vitals Value Taken Time  BP 112/49 06/28/24 09:30  Temp 36.3 C 06/28/24 09:23  Pulse 59 06/28/24 09:35  Resp 18 06/28/24 09:35  SpO2 89 % 06/28/24 09:35  Vitals shown include unfiled device data.  Last Pain:  Vitals:   06/28/24 0923  TempSrc: Temporal  PainSc:       Patients Stated Pain Goal: 0 (06/28/24 0435)  Complications: No notable events documented.

## 2024-06-29 ENCOUNTER — Encounter (HOSPITAL_COMMUNITY): Payer: Self-pay | Admitting: Internal Medicine

## 2024-06-29 DIAGNOSIS — N179 Acute kidney failure, unspecified: Secondary | ICD-10-CM | POA: Diagnosis not present

## 2024-06-29 MED ORDER — POLYETHYLENE GLYCOL 3350 17 G PO PACK: 17.0000 g | PACK | Freq: Every day | ORAL | 0 refills | Status: AC | PRN

## 2024-06-29 MED ORDER — POTASSIUM CHLORIDE 20 MEQ PO PACK
40.0000 meq | PACK | Freq: Once | ORAL | Status: AC
  Administered 2024-06-29: 40 meq via ORAL
  Filled 2024-06-29: qty 2

## 2024-06-29 MED ORDER — PANTOPRAZOLE SODIUM 40 MG PO TBEC: 40.0000 mg | DELAYED_RELEASE_TABLET | Freq: Two times a day (BID) | ORAL | 2 refills | Status: AC

## 2024-06-29 MED ORDER — SUCRALFATE 1 GM/10ML PO SUSP: 1.0000 g | Freq: Three times a day (TID) | ORAL | 0 refills | Status: DC

## 2024-06-29 MED ORDER — PANTOPRAZOLE SODIUM 40 MG PO TBEC: 40.0000 mg | DELAYED_RELEASE_TABLET | Freq: Two times a day (BID) | ORAL | 2 refills | Status: DC

## 2024-06-29 MED ORDER — SUCRALFATE 1 GM/10ML PO SUSP: 1.0000 g | Freq: Three times a day (TID) | ORAL | 0 refills | Status: AC

## 2024-06-29 MED ORDER — POLYETHYLENE GLYCOL 3350 17 G PO PACK: 17.0000 g | PACK | Freq: Every day | ORAL | 0 refills | Status: DC | PRN

## 2024-06-29 NOTE — Progress Notes (Signed)
 Occupational Therapy Treatment Patient Details Name: Deanna Acosta MRN: [id number] DOB: October 08, 1935 Today's Date: 06/29/2024   History of present illness Pt is a 88 y.o. female admitted 9/18 from [facility] for nausea, difficulty swallowing, eating minimally and increased urinary frequency. Admitted for AKI and UTI. 9/23 EGD showed esophagitis/gastritis. Recent hospitalization 05/2024 for L acetabular fx, s/p ORIF. PMH: HTN, HLD, COPD, osteoporosis   OT comments  Pt progressing towards OT goals this session. Focus on caregiver education and ADL performance. Pt and husband completed LB dressing at bed level with supervision for safety and cues (TDWB through LLE), Pt able to come EOB with assist from husband and supervision from therapist. OT/Pt/Husband and Daughter educated on sequencing of dressing, making smart choices for fall prevention, setting mutual goals with HH therapies, completing HEPs, and answering all questions about ADL in home setting that they had before d/c. HHOT contiues to be recommended post-acute to maximize safety and independence in ADL and functional transfers.       If plan is discharge home, recommend the following:  A lot of help with walking and/or transfers;A lot of help with bathing/dressing/bathroom;Assistance with cooking/housework;Direct supervision/assist for medications management;Assist for transportation;Help with stairs or ramp for entrance;Supervision due to cognitive status   Equipment Recommendations  None recommended by OT (Pt has appropriate DME)    Recommendations for Other Services      Precautions / Restrictions Precautions Precautions: Fall Recall of Precautions/Restrictions: Impaired Restrictions Weight Bearing Restrictions Per Provider Order: Yes LLE Weight Bearing Per Provider Order: Touchdown weight bearing Other Position/Activity Restrictions: No motion restrictions       Mobility Bed Mobility Overal bed mobility: Needs  Assistance Bed Mobility: Rolling, Supine to Sit Rolling: Min assist   Supine to sit: Min assist     General bed mobility comments: Pt's husband assisted with rolling (to don brief) and then to come EOB with supervision from therapist. He did a great job.    Transfers                         Balance Overall balance assessment: Needs assistance Sitting-balance support: Feet supported Sitting balance-Leahy Scale: Good                                     ADL either performed or assessed with clinical judgement   ADL Overall ADL's : Needs assistance/impaired     Grooming: Set up;Wash/dry face;Wash/dry hands;Sitting Grooming Details (indicate cue type and reason): on EOB             Lower Body Dressing: Maximal assistance;Bed level;With caregiver independent assisting Lower Body Dressing Details (indicate cue type and reason): Husband assisted his wife in donning adult diaper rolling. cues for WB through LLE Toilet Transfer: Maximal assistance;With caregiver independent Engineer, drilling Details (indicate cue type and reason): husband performed transfer with supervision from therapist           General ADL Comments: Husband and middle daughter Arland present throughout session. Education for safety for Pt and caregiver in assisting with ADL. both verbalized understanding.    Extremity/Trunk Assessment              Vision       Haematologist Communication Communication: Impaired Factors Affecting Communication: Hearing impaired   Cognition Arousal: Alert Behavior During Therapy: [facility] for tasks  assessed/performed Cognition: History of cognitive impairments Difficult to assess due to: Hard of hearing/deaf                             Following commands: Impaired Following commands impaired: Follows one step commands with increased time, Follows multi-step commands with increased  time      Cueing   Cueing Techniques: Verbal cues, Gestural cues, Tactile cues, Visual cues  Exercises      Shoulder Instructions       General Comments Pt/Husband/Daughter present and verbalized understanding for caregiver education. Family provided with gait belt to assist with transfers, verbalized understanding of TDWB LLE until follow up MD appointment. They have the correct DME at home and have no questions currently. Encouraged them to set goals with the [facility] therapists to maximize progress and get Pt buy in.    Pertinent Vitals/ Pain       Pain Assessment Pain Assessment: Faces Faces Pain Scale: Hurts a little bit Pain Location: L leg Pain Descriptors / Indicators: Discomfort, Grimacing, Guarding Pain Intervention(s): Monitored during session, Repositioned  Home Living                                          Prior Functioning/Environment              Frequency  Min 2X/week        Progress Toward Goals  OT Goals(current goals can now be found in the care plan section)  Progress towards OT goals: Progressing toward goals  Acute Rehab OT Goals Patient Stated Goal: to get home to her dog Hope OT Goal Formulation: With patient/family Time For Goal Achievement: 07/09/24 Potential to Achieve Goals: Good  Plan      Co-evaluation                 AM-PAC OT 6 Clicks Daily Activity     Outcome Measure   Help from another person eating meals?: A Little Help from another person taking care of personal grooming?: A Little Help from another person toileting, which includes using toliet, bedpan, or urinal?: A Lot Help from another person bathing (including washing, rinsing, drying)?: A Little Help from another person to put on and taking off regular upper body clothing?: A Little Help from another person to put on and taking off regular lower body clothing?: A Lot 6 Click Score: 16    End of Session Equipment Utilized During Treatment:  Gait belt  OT Visit Diagnosis: Unsteadiness on feet (R26.81);Other abnormalities of gait and mobility (R26.89);History of falling (Z91.81);Pain;Other symptoms and signs involving cognitive function Pain - Right/Left: Left Pain - part of body: Hip   Activity Tolerance Patient tolerated treatment well   Patient Left with family/visitor present (working with PT)   Nurse Communication Mobility status        Time: 9082-9064 OT Time Calculation (min): 18 min  Charges: OT General Charges $OT Visit: 1 Visit OT Treatments $Self Care/Home Management : 8-22 mins  Leita DEL OTR/L Acute Rehabilitation Services Office: [phone]   Leita PARAS [facility] 06/29/2024, 10:49 AM

## 2024-06-29 NOTE — Plan of Care (Signed)

## 2024-06-29 NOTE — Discharge Summary (Signed)
 Physician Discharge Summary  Deanna Acosta FMW:[id number] DOB: 06-Dec-1935 DOA: 06/23/2024  PCP: Cleotilde Planas, MD  Admit date: 06/23/2024 Discharge date: 06/29/2024  Admitted From: Home  Discharge disposition: Home with home health  Recommendations for Outpatient Follow-Up:   Follow up with your primary care provider in one week.  Check CBC, BMP, magnesium  in the next visit Follow-up with Dr. Abran GI as outpatient when scheduled by the clinic   Discharge Diagnosis:   Principal Problem:   AKI (acute kidney injury) Active Problems:   Abnormal esophagram   Acute esophagitis   Hiatal hernia   Discharge Condition: Improved.  Diet recommendation: Soft diet  Wound care: None.  Code status: Full.   History of Present Illness:   Deanna Acosta is a 88 y.o. female with past medical history of hypertension, hyperlipidemia, COPD, CKD stage IIIb, history of esophageal dysmotility s/p dilatation in the past and hiatal hernia presented to hospital with left acetabular fracture, difficulty swallowing with poor oral intake dysuria and increased frequency.  In the ED patient had stable vitals except for tachycardia.  Labs were notable for magnesium  of 1.6 and mild leukocytosis at 12.1.  Patient was then admitted to hospital for AKI dysphagia and UTI.    Hospital Course:   Following conditions were addressed during hospitalization as listed below,  Dysphagia with history of esophageal dysmotility with history of dilatation Seen by speech therapy GI during hospitalization. Modified barium study 9/22 shows no passage of contrast beyond herniated stomach.  Patient underwent EGD on 9/23 with findings of esophagitis.  GI has recommended PPI twice daily and Carafate .  Continue aspirin .  Diet has been advanced to soft and has been doing really well.  Patient's family at bedside.   Prerenal AKI secondary to poor oral intake. Initial serum creatinine of 2.1, currently near baseline at  1.0 received IV fluids during hospitalization..   Encouraged oral intake.   UTI,  Urine cultures with mixed growth.  Has completed 3-day course of antibiotics with IV Rocephin .  Mild hypokalemia.  Will replenish orally.  Will restart her HCTZ in couple of days.   Hypertension Continue Cardizem  and metoprolol .  Start HCTZ in couple of days.   Hypomagnesemia- normalized after replacement   Recent left acetabulum surgery.  Continue physical therapy.  Seen by physical therapy and recommend skilled nursing facility placement but patient wishes to go home with home health.  Disposition.  At this time, patient is stable for disposition home with home health with PCP and GI follow-up as outpatient.  Patient's family at bedside prior to disposition  Medical Consultants:   GI  Procedures:    EGD Subjective:   Today, patient was seen and examined at bedside.  Has been able to tolerate oral diet.  Wants to go home.  Patient's family at bedside.  Denies any nausea or vomiting.  No fever chills or shortness of breath.  Discharge Exam:   Vitals:   06/29/24 0817 06/29/24 1143  BP: (!) 139/58 (!) 130/52  Pulse: 60 (!) 57  Resp: 18 18  Temp: (!) 97.5 F (36.4 C) (!) 97.5 F (36.4 C)  SpO2: 96% 94%   Vitals:   06/28/24 2354 06/29/24 0445 06/29/24 0817 06/29/24 1143  BP:  (!) 159/60 (!) 139/58 (!) 130/52  Pulse:  61 60 (!) 57  Resp:  18 18 18   Temp:  97.7 F (36.5 C) (!) 97.5 F (36.4 C) (!) 97.5 F (36.4 C)  TempSrc: Oral  Oral Oral  SpO2:  93% 96% 94%  Weight:      Height:       Body mass index is 27.4 kg/m.  General: Alert awake, not in obvious distress, elderly female, Communicative HENT: pupils equally reacting to light, mild pallor noted. Oral mucosa is moist.  Chest:  Clear breath sounds.  . No crackles or wheezes.  CVS: S1 &S2 heard. No murmur.  Regular rate and rhythm. Abdomen: Soft, nontender, nondistended.  Bowel sounds are heard.   Extremities: No cyanosis,  clubbing or edema.  Peripheral pulses are palpable. Psych: Alert, awake and Communicative. CNS:  No cranial nerve deficits.  Power equal in all extremities.   Skin: Warm and dry.  No rashes noted.  The results of significant diagnostics from this hospitalization (including imaging, microbiology, ancillary and laboratory) are listed below for reference.     Diagnostic Studies:   DG Chest Port 1 View Result Date: 06/23/2024 CLINICAL DATA:  difficulty swallowing EXAM: PORTABLE CHEST 1 VIEW COMPARISON:  Chest x-ray 08/28/2023 FINDINGS: Patient is rotated. The heart and mediastinal contours are unchanged. Atherosclerotic plaque. No focal consolidation. Chronic coarsened interstitial markings with no overt pulmonary edema. No pleural effusion. No pneumothorax. No acute osseous abnormality. IMPRESSION: 1. No active disease. 2.  Aortic Atherosclerosis (ICD10-I70.0). Electronically Signed   By: Morgane  Naveau M.D.   On: 06/23/2024 18:03     Labs:   Basic Metabolic Panel: Recent Labs  Lab 06/23/24 1742 06/24/24 0428 06/28/24 0520  NA 135 139 138  K 4.3 3.5 3.3*  CL 102 102 106  CO2 18* 18* 21*  GLUCOSE 98 90 91  BUN 70* 57* 16  CREATININE 2.11* 1.50* 1.09*  CALCIUM  9.5 9.3 10.3  MG 1.6* 2.3  --   PHOS  --  3.4  --    GFR Estimated Creatinine Clearance: 36.1 mL/min (A) (by C-G formula based on SCr of 1.09 mg/dL (H)). Liver Function Tests: Recent Labs  Lab 06/23/24 1742 06/28/24 0520  AST 27 19  ALT 11 13  ALKPHOS 175* 151*  BILITOT 0.4 0.8  PROT 6.9 5.8*  ALBUMIN  3.2* 2.6*   No results for input(s): LIPASE, AMYLASE in the last 168 hours. No results for input(s): AMMONIA in the last 168 hours. Coagulation profile No results for input(s): INR, PROTIME in the last 168 hours.  CBC: Recent Labs  Lab 06/23/24 1637 06/24/24 0428 06/28/24 0520  WBC 12.1* 9.0 9.7  NEUTROABS 8.4*  --   --   HGB 14.0 12.1 11.5*  HCT 43.7 38.6 35.1*  MCV 92.2 93.7 91.4  PLT 456*  339 269   Cardiac Enzymes: No results for input(s): CKTOTAL, CKMB, CKMBINDEX, TROPONINI in the last 168 hours. BNP: Invalid input(s): POCBNP CBG: No results for input(s): GLUCAP in the last 168 hours. D-Dimer No results for input(s): DDIMER in the last 72 hours. Hgb A1c No results for input(s): HGBA1C in the last 72 hours. Lipid Profile No results for input(s): CHOL, HDL, LDLCALC, TRIG, CHOLHDL, LDLDIRECT in the last 72 hours. Thyroid function studies No results for input(s): TSH, T4TOTAL, T3FREE, THYROIDAB in the last 72 hours.  Invalid input(s): FREET3 Anemia work up No results for input(s): VITAMINB12, FOLATE, FERRITIN, TIBC, IRON, RETICCTPCT in the last 72 hours. Microbiology Recent Results (from the past 240 hours)  Urine Culture     Status: Abnormal   Collection Time: 06/23/24  5:05 PM   Specimen: Urine, Clean Catch  Result Value Ref Range Status   Specimen Description URINE, CLEAN CATCH  Final   Special Requests   Final    NONE Reflexed from [id number] Performed at [facility], [address], [place], [place] [address]    Culture MULTIPLE SPECIES PRESENT, SUGGEST RECOLLECTION (A)  Final   Report Status 06/24/2024 FINAL  Final     Discharge Instructions:   Discharge Instructions     Call MD for:  persistant nausea and vomiting   Complete by: As directed    Call MD for:  severe uncontrolled pain   Complete by: As directed    Call MD for:  temperature >100.4   Complete by: As directed    Diet - low sodium heart healthy   Complete by: As directed    Discharge instructions   Complete by: As directed    Follow-up with your primary care provider in 1 week.  Seek medical attention for worsening symptoms.  Follow-up with Dr. Abran GI as scheduled by the clinic.  Please take a soft diet.  Take medications as prescribed.  Reflux precautions with eating and drinking upright to the greatest extent  possible, trying to  lie down with an empty stomach   Increase activity slowly   Complete by: As directed    No wound care   Complete by: As directed       Allergies as of 06/29/2024       Reactions   Cortisone Other (See Comments)   Headaches   Penicillins Hives, Other (See Comments)   Has patient had a PCN reaction causing immediate rash, facial/tongue/throat swelling, SOB or lightheadedness with hypotension: Yes Has patient had a PCN reaction causing severe rash involving mucus membranes or skin necrosis: No Has patient had a PCN reaction that required hospitalization No Has patient had a PCN reaction occurring within the last 10 years: No If all of the above answers are NO, then may proceed with Cephalosporin use.   Losartan  Diarrhea, Nausea And Vomiting   Other Nausea And Vomiting, Other (See Comments)   Fluid pills cause cramps   Tape Other (See Comments)   SKIN IS VERY THIN AND BRUISES AND TEARS VERY EASILY!!!!!   Levofloxacin  Rash        Medication List     PAUSE taking these medications    hydrochlorothiazide  25 MG tablet Wait to take this until: July 01, 2024 Morning Commonly known as: HYDRODIURIL  Take 25 mg by mouth daily.       STOP taking these medications    enoxaparin  40 MG/0.4ML injection Commonly known as: LOVENOX    omeprazole 40 MG capsule Commonly known as: PRILOSEC       TAKE these medications    acetaminophen  500 MG tablet Commonly known as: TYLENOL  Take 2 tablets (1,000 mg total) by mouth every 6 (six) hours as needed for mild pain (pain score 1-3). What changed: when to take this   Advair HFA 115-21 MCG/ACT inhaler Generic drug: fluticasone -salmeterol Inhale 2 puffs into the lungs 2 (two) times daily.   albuterol  (2.5 MG/3ML) 0.083% nebulizer solution Commonly known as: PROVENTIL  Take 3 mLs (2.5 mg total) by nebulization every 4 (four) hours as needed for wheezing or shortness of breath. What changed: reasons to take this   Aspirin  325 MG  Caps Take 325 mg by mouth every evening. Take one capsule (325mg ) by mouth every evening at 2000.   Betasept  Surgical Scrub 4 % external liquid Generic drug: chlorhexidine  Apply 15 mLs (1 Application total) topically as directed for 30 doses. Use as directed daily for  5 days every other week for 6 weeks. What changed: additional instructions   cholecalciferol  25 MCG (1000 UNIT) tablet Commonly known as: VITAMIN D3 Take 1,000 Units by mouth every evening. Give one tablet (1000iu) by mouth every evening at 2000.   diltiazem  120 MG 24 hr capsule Commonly known as: CARDIZEM  CD Take 120 mg by mouth daily.   diphenoxylate -atropine  2.5-0.025 MG tablet Commonly known as: Lomotil  Take 1-2 tablets by mouth every 6 (six) hours as needed for diarrhea or loose stools. What changed:  how much to take when to take this   Ensure Original Liqd Take 1 each by mouth 3 (three) times daily after meals.   ferrous sulfate  325 (65 FE) MG tablet Take 325 mg by mouth daily with breakfast.   FLUoxetine  40 MG capsule Commonly known as: PROZAC  Take 40 mg by mouth in the morning.   lidocaine  5 % Commonly known as: LIDODERM  Place 1 patch onto the skin daily. Remove & Discard patch within 12 hours or as directed by MD   melatonin 5 MG Tabs Take 5 mg by mouth at bedtime.   metoprolol  succinate 100 MG 24 hr tablet Commonly known as: TOPROL -XL Take 100 mg by mouth daily.   pantoprazole  40 MG tablet Commonly known as: PROTONIX  Take 1 tablet (40 mg total) by mouth 2 (two) times daily before a meal.   polyethylene glycol 17 g packet Commonly known as: MIRALAX  / GLYCOLAX  Take 17 g by mouth daily as needed for mild constipation.   rosuvastatin  5 MG tablet Commonly known as: CRESTOR  Take 5 mg by mouth in the morning.   sucralfate  1 GM/10ML suspension Commonly known as: CARAFATE  Take 10 mLs (1 g total) by mouth 4 (four) times daily -  with meals and at bedtime for 14 days.   traMADol  50 MG  tablet Commonly known as: Ultram  Take 1 tablet (50 mg total) by mouth every 6 (six) hours as needed. What changed: when to take this        Follow-up Information     [facility] Follow up.   Specialty: Home Health Services Why: [facility] will provide home health physical and occupational therapy. Someone from the agency will be contacting you to arrange your first appointment. Contact information: [address] [place] [address] [phone]         Abran Norleen SAILOR, MD Follow up.   Specialty: Gastroenterology Why: call if not heard from office Contact information: [address] [place] [address] [phone]         Cleotilde Planas, MD Follow up in 1 week(s).   Specialty: Family Medicine Contact information: [address] [place] [address] [phone]                  Time coordinating discharge: 39 minutes  Signed:  Arrie Zuercher  [facility] 06/29/2024, 12:19 PM

## 2024-06-29 NOTE — TOC Progression Note (Addendum)
 Transition of Care (TOC) - Progression Note   Spoke to patient and her husband [name] at bedside. They have decided to go home at discharge. They have 24/7 family support.   HHPT,OT,RN was already arranged with [facility] ( confined with [name] with [facility] ) , both in agreement.   Patient has wheelchair, walker and bedside commode at home already .   PT recommending hospital bed. Both feel that she does not need hospital bed at this time. NCM explained if the change their mind after they are home , [facility] and PCP can arrange.   Both voiced understanding .   Patient's address is [address] [place], [place], [place] [address]  [name] with [facility] aware discharge is today   Patient Details  Name: Deanna Acosta MRN: [id number] Date of Birth: 1936-10-04  Transition of [facility]) CM/SW Contact  Williamson Cavanah, Powell Jansky, RN Phone Number: 06/29/2024, 11:39 AM  Clinical Narrative:       Expected Discharge Plan: Skilled Nursing Facility Barriers to Discharge: Insurance Authorization, Continued Medical Work up               Expected Discharge Plan and Services In-house Referral: Clinical Social Work     Living arrangements for the past 2 months: Single Family Home                                       Social Drivers of Health (SDOH) Interventions SDOH Screenings   Food Insecurity: No Food Insecurity (06/24/2024)  Housing: Low Risk  (06/24/2024)  Transportation Needs: No Transportation Needs (06/24/2024)  Utilities: Not At Risk (06/24/2024)  Social Connections: Moderately Integrated (06/24/2024)  Tobacco Use: Medium Risk (06/28/2024)    Readmission Risk Interventions     No data to display

## 2024-06-29 NOTE — Progress Notes (Signed)
 Physical Therapy Treatment Patient Details Name: Deanna Acosta MRN: [id number] DOB: 1936/04/12 Today's Date: 06/29/2024   History of Present Illness Pt is a 88 y.o. female admitted 9/18 from [facility] for nausea, difficulty swallowing, eating minimally and increased urinary frequency. Admitted for AKI and UTI. 9/23 EGD showed esophagitis/gastritis. Recent hospitalization 05/2024 for L acetabular fx, s/p ORIF. PMH: HTN, HLD, COPD, osteoporosis   PT Comments  Worked on safety for discharge with husband able to demonstrate squat-pivot to the right with use of gait belt. Pt and family are feeling comfortable with transfers and self-care with 24/7 assist available. Discussed car transfer and continuing with LE HEP given last session. Continue to recommend HHPT upon d/c home with acute PT to follow.     If plan is discharge home, recommend the following: A lot of help with walking and/or transfers;A lot of help with bathing/dressing/bathroom   Can travel by private vehicle     Yes  Equipment Recommendations  Hospital bed;Wheelchair (measurements PT)       Precautions / Restrictions Precautions Precautions: Fall Recall of Precautions/Restrictions: Impaired Restrictions Weight Bearing Restrictions Per Provider Order: Yes LLE Weight Bearing Per Provider Order: Touchdown weight bearing Other Position/Activity Restrictions: No motion restrictions     Mobility  Bed Mobility  General bed mobility comments: seated on EOB    Transfers Overall transfer level: Needs assistance Equipment used: None Transfers: Bed to chair/wheelchair/BSC    Squat pivot transfers: Mod assist    General transfer comment: husband performed face to face squat pivot transfer to recliner with therapist supervision      Balance Overall balance assessment: Needs assistance Sitting-balance support: Feet supported Sitting balance-Leahy Scale: Fair     Standing balance support: Bilateral upper extremity  supported, During functional activity Standing balance-Leahy Scale: Zero       Communication Communication Communication: Impaired Factors Affecting Communication: Hearing impaired  Cognition Arousal: Alert Behavior During Therapy: WFL for tasks assessed/performed   PT - Cognitive impairments: Memory, Awareness, Safety/Judgement    PT - Cognition Comments: Frequent re-direction to task Following commands: Impaired Following commands impaired: Follows one step commands with increased time, Follows multi-step commands with increased time    Cueing Cueing Techniques: Verbal cues, Gestural cues, Tactile cues, Visual cues  Exercises General Exercises - Lower Extremity Ankle Circles/Pumps: AROM, Both, 5 reps, Supine Heel Slides: AROM, Left, 5 reps, Supine Straight Leg Raises: AAROM, Left, 5 reps, Supine        Pertinent Vitals/Pain Pain Assessment Pain Assessment: Faces Faces Pain Scale: Hurts a little bit Pain Location: L toenails Pain Descriptors / Indicators: Discomfort, Grimacing, Guarding Pain Intervention(s): Limited activity within patient's tolerance, Monitored during session, Repositioned     PT Goals (current goals can now be found in the care plan section) Acute Rehab PT Goals Patient Stated Goal: to go home and see her dog PT Goal Formulation: With patient Time For Goal Achievement: 07/09/24 Potential to Achieve Goals: Good Progress towards PT goals: Progressing toward goals    Frequency    Min 2X/week          AM-PAC PT 6 Clicks Mobility   Outcome Measure  Help needed turning from your back to your side while in a flat bed without using bedrails?: A Little Help needed moving from lying on your back to sitting on the side of a flat bed without using bedrails?: A Little Help needed moving to and from a bed to a chair (including a wheelchair)?: A Lot Help needed standing up  from a chair using your arms (e.g., wheelchair or bedside chair)?: Total Help  needed to walk in hospital room?: Total Help needed climbing 3-5 steps with a railing? : Total 6 Click Score: 11    End of Session Equipment Utilized During Treatment: Gait belt Activity Tolerance: Patient tolerated treatment well Patient left: in chair;with call bell/phone within reach;with chair alarm set;with family/visitor present Nurse Communication: Mobility status PT Visit Diagnosis: Difficulty in walking, not elsewhere classified (R26.2);Unsteadiness on feet (R26.81);Other abnormalities of gait and mobility (R26.89)     Time: 9069-9046 PT Time Calculation (min) (ACUTE ONLY): 23 min  Charges:    $Therapeutic Exercise: 8-22 mins $Therapeutic Activity: 8-22 mins PT General Charges $$ ACUTE PT VISIT: 1 Visit                    Kate ORN, PT, DPT Secure Chat Preferred  Rehab Office [phone]    Kate BRAVO [name] 06/29/2024, 10:01 AM

## 2024-06-30 ENCOUNTER — Encounter: Payer: Self-pay | Admitting: Gastroenterology

## 2024-07-01 LAB — SURGICAL PATHOLOGY

## 2024-07-05 ENCOUNTER — Ambulatory Visit: Payer: Self-pay | Admitting: Internal Medicine

## 2024-08-16 ENCOUNTER — Other Ambulatory Visit: Payer: Self-pay | Admitting: Orthopedic Surgery

## 2024-08-16 DIAGNOSIS — S32482D Displaced dome fracture of left acetabulum, subsequent encounter for fracture with routine healing: Secondary | ICD-10-CM

## 2024-08-17 ENCOUNTER — Ambulatory Visit
Admission: RE | Admit: 2024-08-17 | Discharge: 2024-08-17 | Disposition: A | Discharge status: Home / Self Care | Source: Ambulatory Visit | Attending: Orthopedic Surgery | Admitting: Orthopedic Surgery

## 2024-08-17 DIAGNOSIS — S32482D Displaced dome fracture of left acetabulum, subsequent encounter for fracture with routine healing: Secondary | ICD-10-CM

## 2024-08-22 ENCOUNTER — Ambulatory Visit: Admitting: Gastroenterology

## 2024-08-22 ENCOUNTER — Encounter: Payer: Self-pay | Admitting: Gastroenterology

## 2024-08-22 VITALS — BP 140/66 | HR 72

## 2024-08-22 DIAGNOSIS — K449 Diaphragmatic hernia without obstruction or gangrene: Secondary | ICD-10-CM

## 2024-08-22 DIAGNOSIS — K209 Esophagitis, unspecified without bleeding: Secondary | ICD-10-CM | POA: Diagnosis not present

## 2024-08-22 DIAGNOSIS — J441 Chronic obstructive pulmonary disease with (acute) exacerbation: Secondary | ICD-10-CM

## 2024-08-22 DIAGNOSIS — K219 Gastro-esophageal reflux disease without esophagitis: Secondary | ICD-10-CM

## 2024-08-22 DIAGNOSIS — K2100 Gastro-esophageal reflux disease with esophagitis, without bleeding: Secondary | ICD-10-CM

## 2024-08-22 DIAGNOSIS — R1310 Dysphagia, unspecified: Secondary | ICD-10-CM | POA: Diagnosis not present

## 2024-08-22 DIAGNOSIS — N1831 Chronic kidney disease, stage 3a: Secondary | ICD-10-CM

## 2024-08-22 NOTE — Patient Instructions (Signed)
 _______________________________________________________  If your blood pressure at your visit was 140/90 or greater, please contact your primary care physician to follow up on this.  _______________________________________________________  If you are age 86 or older, your body mass index should be between 23-30. Your There is no height or weight on file to calculate BMI. If this is out of the aforementioned range listed, please consider follow up with your Primary Care Provider.  If you are age 37 or younger, your body mass index should be between 19-25. Your There is no height or weight on file to calculate BMI. If this is out of the aformentioned range listed, please consider follow up with your Primary Care Provider.   ________________________________________________________  The [facility] providers would like to encourage you to use MYCHART to communicate with providers for non-urgent requests or questions.  Due to long hold times on the telephone, sending your provider a message by [facility] may be a faster and more efficient way to get a response.  Please allow 48 business hours for a response.  Please remember that this is for non-urgent requests.  _______________________________________________________  [facility] is using a team-based approach to care.  Your team is made up of your doctor and two to three APPS. Our APPS (Nurse Practitioners and Physician Assistants) work with your physician to ensure care continuity for you. They are fully qualified to address your health concerns and develop a treatment plan. They communicate directly with your gastroenterologist to care for you. Seeing the Advanced Practice Practitioners on your physician's team can help you by facilitating care more promptly, often allowing for earlier appointments, access to diagnostic testing, procedures, and other specialty referrals.   Thank you for entrusting me with your care and choosing [facility]  Nestor Blower, PA- C

## 2024-08-22 NOTE — Progress Notes (Signed)
 Noted
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
## 2024-10-12 ENCOUNTER — Ambulatory Visit: Admitting: Cardiology

## 2024-10-12 ENCOUNTER — Encounter: Payer: Self-pay | Admitting: Cardiology

## 2024-10-12 VITALS — BP 177/84 | HR 58 | Resp 16 | Ht 64.0 in | Wt 159.9 lb

## 2024-10-12 DIAGNOSIS — E782 Mixed hyperlipidemia: Secondary | ICD-10-CM | POA: Diagnosis not present

## 2024-10-12 DIAGNOSIS — Z87891 Personal history of nicotine dependence: Secondary | ICD-10-CM | POA: Diagnosis not present

## 2024-10-12 DIAGNOSIS — I2584 Coronary atherosclerosis due to calcified coronary lesion: Secondary | ICD-10-CM

## 2024-10-12 DIAGNOSIS — I10 Essential (primary) hypertension: Secondary | ICD-10-CM | POA: Diagnosis not present

## 2024-10-12 DIAGNOSIS — R011 Cardiac murmur, unspecified: Secondary | ICD-10-CM

## 2024-10-12 DIAGNOSIS — Z01810 Encounter for preprocedural cardiovascular examination: Secondary | ICD-10-CM

## 2024-10-12 DIAGNOSIS — R0989 Other specified symptoms and signs involving the circulatory and respiratory systems: Secondary | ICD-10-CM

## 2024-10-12 DIAGNOSIS — I700 Atherosclerosis of aorta: Secondary | ICD-10-CM | POA: Diagnosis not present
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
## 2024-10-12 NOTE — Patient Instructions (Addendum)
 Medication Instructions:  Your physician recommends that you continue on your current medications as directed. Please refer to the Current Medication list given to you today.  *If you need a refill on your cardiac medications before your next appointment, please call your pharmacy*  Lab Work: None ordered  If you have labs (blood work) drawn today and your tests are completely normal, you will receive your results only by: MyChart Message (if you have MyChart) OR A paper copy in the mail If you have any lab test that is abnormal or we need to change your treatment, we will call you to review the results.  Testing/Procedures: Echocardiogram  Carotid Duplex  Cardiac PET CT  Follow-Up: At [facility], you and your health needs are our priority.  As part of our continuing mission to provide you with exceptional heart care, our providers are all part of one team.  This team includes your primary Cardiologist (physician) and Advanced Practice Providers or APPs (Physician Assistants and Nurse Practitioners) who all work together to provide you with the care you need, when you need it.  Your next appointment:   6 month(s)  Provider:   Madonna Large, DO    We recommend signing up for the patient portal called MyChart.  Sign up information is provided on this After Visit Summary.  MyChart is used to connect with patients for Virtual Visits (Telemedicine).  Patients are able to view lab/test results, encounter notes, upcoming appointments, etc.  Non-urgent messages can be sent to your provider as well.   To learn more about what you can do with MyChart, go to [email or web address].   Other Instructions Your physician has requested that you have an echocardiogram. Echocardiography is a painless test that uses sound waves to create images of your heart. It provides your doctor with information about the size and shape of your heart and how well your hearts chambers and valves are  working. This procedure takes approximately one hour. There are no restrictions for this procedure. Please do NOT wear cologne, perfume, aftershave, or lotions (deodorant is allowed). Please arrive 15 minutes prior to your appointment time.  Please note: We ask at that you not bring children with you during ultrasound (echo/ vascular) testing. Due to room size and safety concerns, children are not allowed in the ultrasound rooms during exams. Our front office staff cannot provide observation of children in our lobby area while testing is being conducted. An adult accompanying a patient to their appointment will only be allowed in the ultrasound room at the discretion of the ultrasound technician under special circumstances. We apologize for any inconvenience.   Your physician has requested that you have a carotid duplex. This test is an ultrasound of the carotid arteries in your neck. It looks at blood flow through these arteries that supply the brain with blood. Allow one hour for this exam. There are no restrictions or special instructions.      Please report to Radiology at the [facility] Main Entrance 30 minutes early for your test.  [address] [place], [place] [address]    How to Prepare for Your Cardiac PET/CT Stress Test:  Nothing to eat or drink, except water, 3 hours prior to arrival time.  NO caffeine/decaffeinated products, or chocolate 12 hours prior to arrival. (Please note decaffeinated beverages (teas/coffees) still contain caffeine).  If you have caffeine within 12 hours prior, the test will need to be rescheduled.  Medication instructions: No restrictions  NO  perfume, cologne or lotion on chest or abdomen area. FEMALES - Please avoid wearing dresses to this appointment.  Total time is 1 to 2 hours; you may want to bring reading material for the waiting time.   In preparation for your appointment, medication and supplies will be purchased.  Appointment  availability is limited, so if you need to cancel or reschedule, please call the Radiology Department Scheduler at [phone] 24 hours in advance to avoid a cancellation fee of $100.00  What to Expect When you Arrive:  Once you arrive and check in for your appointment, you will be taken to a preparation room within the Radiology Department.  A technologist or Nurse will obtain your medical history, verify that you are correctly prepped for the exam, and explain the procedure.  Afterwards, an IV will be started in your arm and electrodes will be placed on your skin for EKG monitoring during the stress portion of the exam. Then you will be escorted to the PET/CT scanner.  There, staff will get you positioned on the scanner and obtain a blood pressure and EKG.  During the exam, you will continue to be connected to the EKG and blood pressure machines.  A small, safe amount of a radioactive tracer will be injected in your IV to obtain a series of pictures of your heart along with an injection of a stress agent.    After your Exam:  It is recommended that you eat a meal and drink a caffeinated beverage to counter act any effects of the stress agent.  Drink plenty of fluids for the remainder of the day and urinate frequently for the first couple of hours after the exam.  Your doctor will inform you of your test results within 7-10 business days.  For more information and frequently asked questions, please visit our website: [email or web address]  For questions about your test or how to prepare for your test, please call: Cardiac Imaging Nurse Navigators Office: [phone]

## 2024-10-17 ENCOUNTER — Telehealth (HOSPITAL_BASED_OUTPATIENT_CLINIC_OR_DEPARTMENT_OTHER): Payer: Self-pay

## 2024-10-17 NOTE — Telephone Encounter (Signed)
 Clearance was removed from pre-op pool.

## 2024-10-17 NOTE — Telephone Encounter (Signed)
 Per Dr. Michele 10/12/2024 Preop cardiovascular exam Patient being considered for left hip replacement after mechanical fall in August 2025 Surgeon: Dr. Jacklin Date of surgery to be determined EKG illustrates sinus bradycardia with concerns for possible old inferior infarct.  No acute ischemia. Overall function capacity is limited over the last 5 to 6 months status post fall Based on the questionnaires she is not able to perform 4 levels of met activity Her prior CT scans of the chest have noted extensive coronary artery calcification as well as aortic atherosclerosis. Echocardiogram ordered for reasons mentioned above We discussed undergoing cardiac PET/CT to evaluate for reversible ischemia prior to providing preoperative risk stratification.  Patient agreeable with the plan of care.  Note sent to surgeon

## 2024-10-17 NOTE — Telephone Encounter (Signed)
"  ° °  Pre-operative Risk Assessment    Patient Name: Deanna Acosta  DOB: 08-31-1936 MRN: [id number]   Date of last office visit: 10/12/2024 with Dr. Michele Date of next office visit: None  Request for Surgical Clearance    Procedure:  Left hip conversion ORIF to total hip arthroplasty  Date of Surgery:  Clearance TBD                                 Surgeon:  Dr. Toribio Higashi  Surgeon's Group or Practice Name:  [facility] Phone number:  [phone] Fax number:  [phone]   Type of Clearance Requested:   - Medical  - Pharmacy:  Hold Aspirin  (325 mg) -does not specify   Type of Anesthesia:  Spinal   Additional requests/questions:  None  SignedPatrcia Iverson CROME   10/17/2024, 12:07 PM   "

## 2024-10-17 NOTE — Telephone Encounter (Signed)
" ° °  Name:  Deanna Acosta  DOB:  Dec 18, 1935  MRN:  [id number]   Primary Cardiologist: Madonna Large, DO  Chart reviewed as part of pre-operative protocol coverage. Patient was contacted 10/17/2024 in reference to pre-operative risk assessment for pending surgery as outlined below.  Deanna Acosta was last seen on 10/12/2024 by Dr.Tolia.    His recommendation: Per Dr. Large 10/12/2024 Preop cardiovascular exam Patient being considered for left hip replacement after mechanical fall in August 2025 Surgeon: Dr. Jacklin Date of surgery to be determined EKG illustrates sinus bradycardia with concerns for possible old inferior infarct.  No acute ischemia. Overall function capacity is limited over the last 5 to 6 months status post fall Based on the questionnaires she is not able to perform 4 levels of met activity Her prior CT scans of the chest have noted extensive coronary artery calcification as well as aortic atherosclerosis. Echocardiogram ordered for reasons mentioned above We discussed undergoing cardiac PET/CT to evaluate for reversible ischemia prior to providing preoperative risk stratification.  Patient agreeable with the plan of care. Due to new or worsening symptoms, Deanna Acosta will require a follow-up visit for further pre-operative risk assessment.  Pre-op covering staff: - Please schedule appointment and call patient to inform them. If patient already had an upcoming appointment within acceptable timeframe, please add pre-op clearance to the appointment notes so provider is aware. - Please contact requesting surgeon's office via preferred method (i.e, phone, fax) to inform them of need for appointment prior to surgery.  Lamarr Satterfield, NP 10/17/2024, 12:14 PM    "

## 2024-10-19 NOTE — Telephone Encounter (Addendum)
 Patient was recently seen by Dr. Michele on 10/12/2024 for pre-op evaluation. Cardiac PET stress test and Echo were ordered. Cardiac PET stress test is scheduled for 11/09/2024 and Echo is scheduled for 11/11/2024. Will need to wait on these results before finalizing risk assessment.

## 2024-11-08 ENCOUNTER — Encounter (HOSPITAL_COMMUNITY): Payer: Self-pay

## 2024-11-08 ENCOUNTER — Telehealth (HOSPITAL_COMMUNITY): Payer: Self-pay | Admitting: *Deleted

## 2024-11-09 ENCOUNTER — Ambulatory Visit (HOSPITAL_COMMUNITY): Admission: RE | Admit: 2024-11-09 | Source: Ambulatory Visit

## 2024-11-09 DIAGNOSIS — I2584 Coronary atherosclerosis due to calcified coronary lesion: Secondary | ICD-10-CM

## 2024-11-09 DIAGNOSIS — Z01810 Encounter for preprocedural cardiovascular examination: Secondary | ICD-10-CM

## 2024-11-09 LAB — NM PET CT CARDIAC PERFUSION MULTI W/ABSOLUTE BLOODFLOW
LV dias vol: 81 mL (ref 46–106)
MBFR: 2.72
Nuc Rest EF: 56 %
Nuc Stress EF: 59 %
Peak HR: 80 {beats}/min
Rest HR: 62 {beats}/min
Rest MBF: 0.88 ml/g/min
Rest Nuclear Isotope Dose: 18.9 mCi
ST Depression (mm): 0 mm
Stress MBF: 2.39 ml/g/min
Stress Nuclear Isotope Dose: 18.8 mCi
TID: 1

## 2024-11-09 MED ORDER — REGADENOSON 0.4 MG/5ML IV SOLN
0.4000 mg | Freq: Once | INTRAVENOUS | Status: AC
  Administered 2024-11-09: 0.4 mg via INTRAVENOUS

## 2024-11-09 MED ORDER — RUBIDIUM RB82 GENERATOR (RUBYFILL)
18.9000 | PACK | Freq: Once | INTRAVENOUS | Status: AC
  Administered 2024-11-09: 18.9 via INTRAVENOUS

## 2024-11-09 MED ORDER — REGADENOSON 0.4 MG/5ML IV SOLN
INTRAVENOUS | Status: AC
  Filled 2024-11-09: qty 5

## 2024-11-10 ENCOUNTER — Ambulatory Visit: Payer: Self-pay | Admitting: Physician Assistant

## 2024-11-10 NOTE — Telephone Encounter (Signed)
 Spoke with patient. Relayed note. All concerns addressed.

## 2024-11-10 NOTE — Telephone Encounter (Signed)
-----   Message from [place], [place] sent at 11/10/2024 10:48 AM EST ----- Very reassuring PET stress test, no evidence of previous scar or significant reversible blockage, overall pumping function is normal. Patient does have coronary artery calcification, but no severe  lesion to cut off the blood flow. Deanna Acosta also has a moderate sized hiatal hernia which will increase the chance of acid flux, but does not need to be fixed. Awaiting echocardiogram tomorrow, if  normal, patient is cleared to proceed with hip surgery.

## 2024-11-10 NOTE — Telephone Encounter (Signed)
 Carmen Dr. Tyree inbox, PET stress test was normal, pending echocardiogram tomorrow, if normal, patient is at acceptable risk to proceed with hip surgery.  May hold aspirin  for 5 to 7 days prior to the surgery and restart as soon as possible afterward at the surgeon's discretion.

## 2024-11-11 ENCOUNTER — Ambulatory Visit (HOSPITAL_COMMUNITY): Admission: RE | Admit: 2024-11-11 | Source: Ambulatory Visit

## 2024-11-11 ENCOUNTER — Ambulatory Visit (HOSPITAL_COMMUNITY)

## 2024-11-11 DIAGNOSIS — Z01810 Encounter for preprocedural cardiovascular examination: Secondary | ICD-10-CM

## 2024-11-11 DIAGNOSIS — R0989 Other specified symptoms and signs involving the circulatory and respiratory systems: Secondary | ICD-10-CM

## 2024-11-11 DIAGNOSIS — R011 Cardiac murmur, unspecified: Secondary | ICD-10-CM

## 2024-11-11 LAB — ECHOCARDIOGRAM COMPLETE
AR max vel: 1.33 cm2
AV Area VTI: 1.17 cm2
AV Area mean vel: 1.16 cm2
AV Mean grad: 11 mmHg
AV Peak grad: 20.6 mmHg
Ao pk vel: 2.27 m/s
Area-P 1/2: 3.54 cm2
S' Lateral: 3.18 cm
